# Patient Record
Sex: Female | Born: 1970 | Race: Black or African American | Hispanic: No | Marital: Married | Smoking: Current every day smoker
Health system: Southern US, Community
[De-identification: ages and names within clinical notes are randomized; demographics above are authoritative.]

## PROBLEM LIST (undated history)

## (undated) DIAGNOSIS — E669 Obesity, unspecified: Secondary | ICD-10-CM

## (undated) DIAGNOSIS — I062 Rheumatic aortic stenosis with insufficiency: Secondary | ICD-10-CM

## (undated) DIAGNOSIS — Z8614 Personal history of Methicillin resistant Staphylococcus aureus infection: Secondary | ICD-10-CM

## (undated) DIAGNOSIS — I10 Essential (primary) hypertension: Secondary | ICD-10-CM

## (undated) DIAGNOSIS — G3184 Mild cognitive impairment, so stated: Secondary | ICD-10-CM

## (undated) DIAGNOSIS — J439 Emphysema, unspecified: Secondary | ICD-10-CM

## (undated) DIAGNOSIS — Z8719 Personal history of other diseases of the digestive system: Secondary | ICD-10-CM

## (undated) DIAGNOSIS — G4730 Sleep apnea, unspecified: Secondary | ICD-10-CM

## (undated) DIAGNOSIS — A048 Other specified bacterial intestinal infections: Secondary | ICD-10-CM

## (undated) DIAGNOSIS — K589 Irritable bowel syndrome without diarrhea: Secondary | ICD-10-CM

## (undated) DIAGNOSIS — I4892 Unspecified atrial flutter: Secondary | ICD-10-CM

## (undated) DIAGNOSIS — G8929 Other chronic pain: Secondary | ICD-10-CM

## (undated) DIAGNOSIS — M1990 Unspecified osteoarthritis, unspecified site: Secondary | ICD-10-CM

## (undated) DIAGNOSIS — M47816 Spondylosis without myelopathy or radiculopathy, lumbar region: Secondary | ICD-10-CM

## (undated) DIAGNOSIS — D649 Anemia, unspecified: Secondary | ICD-10-CM

## (undated) DIAGNOSIS — M5430 Sciatica, unspecified side: Secondary | ICD-10-CM

## (undated) DIAGNOSIS — Z7901 Long term (current) use of anticoagulants: Secondary | ICD-10-CM

## (undated) DIAGNOSIS — K209 Esophagitis, unspecified without bleeding: Secondary | ICD-10-CM

## (undated) DIAGNOSIS — K7581 Nonalcoholic steatohepatitis (NASH): Secondary | ICD-10-CM

## (undated) DIAGNOSIS — I499 Cardiac arrhythmia, unspecified: Secondary | ICD-10-CM

## (undated) DIAGNOSIS — K429 Umbilical hernia without obstruction or gangrene: Secondary | ICD-10-CM

## (undated) DIAGNOSIS — R0600 Dyspnea, unspecified: Secondary | ICD-10-CM

## (undated) DIAGNOSIS — K219 Gastro-esophageal reflux disease without esophagitis: Secondary | ICD-10-CM

## (undated) DIAGNOSIS — I052 Rheumatic mitral stenosis with insufficiency: Secondary | ICD-10-CM

## (undated) DIAGNOSIS — E559 Vitamin D deficiency, unspecified: Secondary | ICD-10-CM

## (undated) DIAGNOSIS — M545 Low back pain, unspecified: Secondary | ICD-10-CM

## (undated) DIAGNOSIS — N170 Acute kidney failure with tubular necrosis: Secondary | ICD-10-CM

## (undated) DIAGNOSIS — R002 Palpitations: Secondary | ICD-10-CM

## (undated) DIAGNOSIS — R519 Headache, unspecified: Secondary | ICD-10-CM

## (undated) DIAGNOSIS — I2510 Atherosclerotic heart disease of native coronary artery without angina pectoris: Secondary | ICD-10-CM

## (undated) DIAGNOSIS — K5909 Other constipation: Secondary | ICD-10-CM

## (undated) DIAGNOSIS — F32A Depression, unspecified: Secondary | ICD-10-CM

## (undated) DIAGNOSIS — J45909 Unspecified asthma, uncomplicated: Secondary | ICD-10-CM

## (undated) DIAGNOSIS — Z952 Presence of prosthetic heart valve: Secondary | ICD-10-CM

## (undated) DIAGNOSIS — I071 Rheumatic tricuspid insufficiency: Secondary | ICD-10-CM

## (undated) DIAGNOSIS — I517 Cardiomegaly: Secondary | ICD-10-CM

## (undated) DIAGNOSIS — K7689 Other specified diseases of liver: Secondary | ICD-10-CM

## (undated) DIAGNOSIS — E7800 Pure hypercholesterolemia, unspecified: Secondary | ICD-10-CM

## (undated) DIAGNOSIS — M5030 Other cervical disc degeneration, unspecified cervical region: Secondary | ICD-10-CM

## (undated) DIAGNOSIS — E119 Type 2 diabetes mellitus without complications: Secondary | ICD-10-CM

## (undated) DIAGNOSIS — G5600 Carpal tunnel syndrome, unspecified upper limb: Secondary | ICD-10-CM

## (undated) DIAGNOSIS — E876 Hypokalemia: Secondary | ICD-10-CM

## (undated) DIAGNOSIS — F419 Anxiety disorder, unspecified: Secondary | ICD-10-CM

## (undated) DIAGNOSIS — F329 Major depressive disorder, single episode, unspecified: Secondary | ICD-10-CM

## (undated) DIAGNOSIS — I428 Other cardiomyopathies: Secondary | ICD-10-CM

## (undated) DIAGNOSIS — K7460 Unspecified cirrhosis of liver: Secondary | ICD-10-CM

## (undated) DIAGNOSIS — I700 Atherosclerosis of aorta: Secondary | ICD-10-CM

## (undated) DIAGNOSIS — E269 Hyperaldosteronism, unspecified: Secondary | ICD-10-CM

## (undated) DIAGNOSIS — G43909 Migraine, unspecified, not intractable, without status migrainosus: Secondary | ICD-10-CM

## (undated) DIAGNOSIS — E785 Hyperlipidemia, unspecified: Secondary | ICD-10-CM

## (undated) DIAGNOSIS — I509 Heart failure, unspecified: Secondary | ICD-10-CM

## (undated) DIAGNOSIS — G4733 Obstructive sleep apnea (adult) (pediatric): Secondary | ICD-10-CM

## (undated) DIAGNOSIS — R0609 Other forms of dyspnea: Secondary | ICD-10-CM

## (undated) DIAGNOSIS — I5030 Unspecified diastolic (congestive) heart failure: Secondary | ICD-10-CM

## (undated) HISTORY — PX: LAPAROSCOPIC GASTRIC BAND REMOVAL WITH LAPAROSCOPIC GASTRIC SLEEVE RESECTION: SHX6498

## (undated) HISTORY — PX: ABDOMINAL HYSTERECTOMY: SHX81

## (undated) HISTORY — PX: HERNIA REPAIR: SHX51

## (undated) HISTORY — PX: VENTRAL HERNIA REPAIR: SHX424

## (undated) HISTORY — PX: MITRAL VALVE REPLACEMENT: SHX147

## (undated) HISTORY — PX: HIATAL HERNIA REPAIR: SHX195

## (undated) HISTORY — PX: TRICUSPID VALVE REPLACEMENT: SHX816

## (undated) HISTORY — PX: CARDIAC VALVE SURGERY: SHX40

## (undated) HISTORY — PX: AORTIC VALVE REPLACEMENT: SHX41

---

## 1990-11-21 HISTORY — PX: BREAST EXCISIONAL BIOPSY: SUR124

## 1994-11-21 HISTORY — PX: LIPOSUCTION TRUNK: SUR833

## 1998-11-21 HISTORY — PX: TUBAL LIGATION: SHX77

## 2003-11-22 HISTORY — PX: UMBILICAL HERNIA REPAIR: SHX196

## 2007-03-01 ENCOUNTER — Ambulatory Visit: Payer: Self-pay | Admitting: General Practice

## 2007-03-06 ENCOUNTER — Ambulatory Visit: Payer: Self-pay | Admitting: General Practice

## 2007-03-27 ENCOUNTER — Emergency Department: Payer: Self-pay | Admitting: Emergency Medicine

## 2008-01-25 ENCOUNTER — Ambulatory Visit: Payer: Self-pay | Admitting: General Practice

## 2008-02-25 ENCOUNTER — Emergency Department: Payer: Self-pay | Admitting: Emergency Medicine

## 2008-06-01 ENCOUNTER — Emergency Department: Payer: Self-pay | Admitting: Emergency Medicine

## 2008-11-07 ENCOUNTER — Ambulatory Visit: Payer: Self-pay | Admitting: Obstetrics & Gynecology

## 2008-11-07 ENCOUNTER — Ambulatory Visit: Payer: Self-pay | Admitting: Internal Medicine

## 2008-11-11 ENCOUNTER — Ambulatory Visit: Payer: Self-pay | Admitting: Obstetrics & Gynecology

## 2008-11-21 HISTORY — PX: ABDOMINAL HYSTERECTOMY: SHX81

## 2009-03-26 ENCOUNTER — Emergency Department: Payer: Self-pay | Admitting: Emergency Medicine

## 2010-04-14 ENCOUNTER — Ambulatory Visit: Payer: Self-pay | Admitting: Obstetrics & Gynecology

## 2011-02-22 ENCOUNTER — Emergency Department (HOSPITAL_COMMUNITY)
Admission: EM | Admit: 2011-02-22 | Discharge: 2011-02-22 | Disposition: A | Discharge status: Home / Self Care | Payer: Self-pay | Attending: Emergency Medicine | Admitting: Emergency Medicine

## 2011-02-22 ENCOUNTER — Emergency Department (HOSPITAL_COMMUNITY): Payer: Self-pay

## 2011-02-22 DIAGNOSIS — R05 Cough: Secondary | ICD-10-CM | POA: Insufficient documentation

## 2011-02-22 DIAGNOSIS — S335XXA Sprain of ligaments of lumbar spine, initial encounter: Secondary | ICD-10-CM | POA: Insufficient documentation

## 2011-02-22 DIAGNOSIS — J40 Bronchitis, not specified as acute or chronic: Secondary | ICD-10-CM | POA: Insufficient documentation

## 2011-02-22 DIAGNOSIS — R059 Cough, unspecified: Secondary | ICD-10-CM | POA: Insufficient documentation

## 2011-02-22 DIAGNOSIS — E785 Hyperlipidemia, unspecified: Secondary | ICD-10-CM | POA: Insufficient documentation

## 2011-02-22 DIAGNOSIS — X58XXXA Exposure to other specified factors, initial encounter: Secondary | ICD-10-CM | POA: Insufficient documentation

## 2011-02-22 DIAGNOSIS — I10 Essential (primary) hypertension: Secondary | ICD-10-CM | POA: Insufficient documentation

## 2011-02-22 LAB — BASIC METABOLIC PANEL
CO2: 26 mEq/L (ref 19–32)
Chloride: 103 mEq/L (ref 96–112)
Creatinine, Ser: 0.87 mg/dL (ref 0.4–1.2)
GFR calc Af Amer: >60 mL/min (ref >60)
Potassium: 3.4 mEq/L — ABNORMAL LOW (ref 3.5–5.1)

## 2011-02-22 LAB — CBC
HCT: 43.6 % (ref 36.0–46.0)
Hemoglobin: 14.6 g/dL (ref 12.0–15.0)
MCH: 29.6 pg (ref 26.0–34.0)
MCHC: 33.5 g/dL (ref 30.0–36.0)

## 2011-02-22 LAB — DIFFERENTIAL
Lymphocytes Relative: 31 % (ref 12–46)
Monocytes Absolute: 0.7 10*3/uL (ref 0.1–1.0)
Monocytes Relative: 11 % (ref 3–12)
Neutro Abs: 3.3 10*3/uL (ref 1.7–7.7)

## 2011-02-22 LAB — URINALYSIS, ROUTINE W REFLEX MICROSCOPIC
Glucose, UA: NEGATIVE mg/dL
Hgb urine dipstick: NEGATIVE
Protein, ur: NEGATIVE mg/dL

## 2012-06-28 ENCOUNTER — Other Ambulatory Visit: Payer: Self-pay | Admitting: Internal Medicine

## 2012-06-28 ENCOUNTER — Ambulatory Visit (HOSPITAL_COMMUNITY)
Admission: RE | Admit: 2012-06-28 | Discharge: 2012-06-28 | Disposition: A | Discharge status: Home / Self Care | Payer: Self-pay | Source: Ambulatory Visit | Attending: Internal Medicine | Admitting: Internal Medicine

## 2012-06-28 DIAGNOSIS — R252 Cramp and spasm: Secondary | ICD-10-CM

## 2012-06-28 DIAGNOSIS — R52 Pain, unspecified: Secondary | ICD-10-CM

## 2012-06-28 DIAGNOSIS — M545 Low back pain, unspecified: Secondary | ICD-10-CM | POA: Insufficient documentation

## 2012-06-28 DIAGNOSIS — M25569 Pain in unspecified knee: Secondary | ICD-10-CM | POA: Insufficient documentation

## 2012-06-28 DIAGNOSIS — M25559 Pain in unspecified hip: Secondary | ICD-10-CM | POA: Insufficient documentation

## 2013-04-06 ENCOUNTER — Emergency Department (HOSPITAL_COMMUNITY)
Admission: EM | Admit: 2013-04-06 | Discharge: 2013-04-06 | Disposition: A | Discharge status: Home / Self Care | Payer: Medicaid Other | Source: Home / Self Care | Attending: Emergency Medicine | Admitting: Emergency Medicine

## 2013-04-06 ENCOUNTER — Encounter (HOSPITAL_COMMUNITY): Payer: Self-pay | Admitting: Emergency Medicine

## 2013-04-06 DIAGNOSIS — H01005 Unspecified blepharitis left lower eyelid: Secondary | ICD-10-CM

## 2013-04-06 DIAGNOSIS — H01009 Unspecified blepharitis unspecified eye, unspecified eyelid: Secondary | ICD-10-CM

## 2013-04-06 HISTORY — DX: Essential (primary) hypertension: I10

## 2013-04-06 HISTORY — DX: Pure hypercholesterolemia, unspecified: E78.00

## 2013-04-06 MED ORDER — TOBRAMYCIN 0.3 % OP SOLN: 1.0000 [drp] | Freq: Four times a day (QID) | OPHTHALMIC | Status: AC

## 2013-04-06 NOTE — ED Notes (Signed)
Pt c/o poss stye on lower left eye lid onset Thursday Also c/o swollen gland on left side of neck onset yest night Sx today include: blurry vision, redness of left eye and swelling Denies: f/v/n/d Tried warm compressions.   [name] alert and oriented w/no signs of acute distress.

## 2013-04-06 NOTE — ED Provider Notes (Signed)
History     CSN: [id number]  Arrival date & time 04/06/13  1456   First MD Initiated Contact with Patient 04/06/13 1607      Chief Complaint  Patient presents with  . Stye    (Consider location/radiation/quality/duration/timing/severity/associated sxs/prior treatment) Patient is a 42 y.o. female presenting with eye problem. The history is provided by the patient.  Eye Problem Location:  L eye Quality:  Aching Severity:  Moderate Onset quality:  Sudden Duration:  3 days Timing:  Constant Chronicity:  New Context: not chemical exposure, not contact lens problem, not direct trauma, not foreign body, not using machinery, not scratch and not tanning booth use   Relieved by:  Nothing Associated symptoms: crusting, discharge, inflammation, itching and redness   Associated symptoms: no blurred vision, no decreased vision, no facial rash, no headaches, no photophobia, no scotomas, no tearing, no tingling, no vomiting and no weakness   Risk factors: no conjunctival hemorrhage, not exposed to pinkeye, no previous injury to eye, no recent herpes zoster and no recent URI     Past Medical History  Diagnosis Date  . Hypertension   . High cholesterol     History reviewed. No pertinent past surgical history.  History reviewed. No pertinent family history.  History  Substance Use Topics  . Smoking status: Not on file  . Smokeless tobacco: Not on file  . Alcohol Use: Not on file    OB History   Grav Para Term Preterm Abortions TAB SAB Ect Mult Living                  Review of Systems  Constitutional: Negative for activity change.  HENT: Negative for ear pain.   Eyes: Positive for discharge, redness and itching. Negative for blurred vision, photophobia, pain and visual disturbance.  Gastrointestinal: Negative for vomiting.  Musculoskeletal: Negative for myalgias.  Neurological: Negative for tingling, weakness and headaches.    Allergies  Mucinex and Sulfa  antibiotics  Home Medications   Current Outpatient Rx  Name  Route  Sig  Dispense  Refill  . lisinopril (PRINIVIL,ZESTRIL) 40 MG tablet   Oral   Take 40 mg by mouth daily.         . hydrochlorothiazide (HYDRODIURIL) 25 MG tablet   Oral   Take 25 mg by mouth daily.         . pravastatin (PRAVACHOL) 20 MG tablet   Oral   Take 20 mg by mouth daily.         Marland Kitchen tobramycin (TOBREX) 0.3 % ophthalmic solution   Left Eye   Place 1 drop into the left eye every 6 (six) hours.   5 mL   0     BP 166/95  Pulse 86  Temp(Src) 98.1 F (36.7 C) (Oral)  Resp 18  SpO2 98%  Physical Exam  Nursing note and vitals reviewed. Constitutional: She appears well-developed and well-nourished.  HENT:  Head: Normocephalic.  Mouth/Throat: No oropharyngeal exudate.  Eyes: Conjunctivae and EOM are normal. Right eye exhibits no discharge. No foreign body present in the right eye. Left eye exhibits hordeolum. Left eye exhibits no discharge. Right conjunctiva has no hemorrhage. Left conjunctiva has no hemorrhage. No scleral icterus. Right eye exhibits normal extraocular motion.    Neck: Neck supple. No JVD present.  Neurological: [name] alert.  Skin: No rash noted. No erythema. No pallor.    ED Course  Procedures (including critical care time)  Labs Reviewed - No data to display  No results found.   1. Blepharitis of left lower eyelid       MDM  Opthalmic abx with tobrymicin- instructed to lubricate eye with visine        Rosana Hoes, MD 04/06/13 1640

## 2013-08-29 ENCOUNTER — Emergency Department: Payer: Self-pay | Admitting: Internal Medicine

## 2013-08-29 LAB — CBC
HCT: 47 % (ref 35.0–47.0)
HGB: 15.8 g/dL (ref 12.0–16.0)
MCH: 30.1 pg (ref 26.0–34.0)
MCV: 90 fL (ref 80–100)
Platelet: 245 10*3/uL (ref 150–440)
RBC: 5.24 10*6/uL — ABNORMAL HIGH (ref 3.80–5.20)
WBC: 11.7 10*3/uL — ABNORMAL HIGH (ref 3.6–11.0)

## 2013-08-29 LAB — URINALYSIS, COMPLETE
Bilirubin,UR: NEGATIVE
Blood: NEGATIVE
Glucose,UR: NEGATIVE mg/dL (ref 0–75)
Nitrite: NEGATIVE
Ph: 6 (ref 4.5–8.0)
Protein: NEGATIVE
Squamous Epithelial: 1

## 2013-08-29 LAB — COMPREHENSIVE METABOLIC PANEL
Alkaline Phosphatase: 83 U/L (ref 50–136)
BUN: 6 mg/dL — ABNORMAL LOW (ref 7–18)
Bilirubin,Total: 0.4 mg/dL (ref 0.2–1.0)
Calcium, Total: 9.3 mg/dL (ref 8.5–10.1)
Chloride: 106 mmol/L (ref 98–107)
Co2: 28 mmol/L (ref 21–32)
Creatinine: 0.75 mg/dL (ref 0.60–1.30)
EGFR (Non-African Amer.): >60
Glucose: 81 mg/dL (ref 65–99)
Potassium: 3.9 mmol/L (ref 3.5–5.1)
SGOT(AST): 20 U/L (ref 15–37)
SGPT (ALT): 26 U/L (ref 12–78)
Total Protein: 8.4 g/dL — ABNORMAL HIGH (ref 6.4–8.2)

## 2014-01-09 ENCOUNTER — Ambulatory Visit: Payer: Self-pay | Admitting: General Practice

## 2014-05-01 ENCOUNTER — Ambulatory Visit: Payer: Self-pay | Admitting: Family Medicine

## 2014-06-05 ENCOUNTER — Ambulatory Visit: Payer: Self-pay | Admitting: Urgent Care

## 2014-06-14 ENCOUNTER — Encounter (HOSPITAL_COMMUNITY): Payer: Self-pay | Admitting: Emergency Medicine

## 2014-06-14 ENCOUNTER — Emergency Department (HOSPITAL_COMMUNITY)
Admission: EM | Admit: 2014-06-14 | Discharge: 2014-06-14 | Disposition: A | Discharge status: Home / Self Care | Payer: Managed Care, Other (non HMO) | Attending: Emergency Medicine | Admitting: Emergency Medicine

## 2014-06-14 DIAGNOSIS — Z79899 Other long term (current) drug therapy: Secondary | ICD-10-CM | POA: Insufficient documentation

## 2014-06-14 DIAGNOSIS — M545 Low back pain, unspecified: Secondary | ICD-10-CM | POA: Insufficient documentation

## 2014-06-14 DIAGNOSIS — F17200 Nicotine dependence, unspecified, uncomplicated: Secondary | ICD-10-CM | POA: Insufficient documentation

## 2014-06-14 DIAGNOSIS — IMO0002 Reserved for concepts with insufficient information to code with codable children: Secondary | ICD-10-CM | POA: Insufficient documentation

## 2014-06-14 DIAGNOSIS — R1011 Right upper quadrant pain: Secondary | ICD-10-CM

## 2014-06-14 DIAGNOSIS — K7689 Other specified diseases of liver: Secondary | ICD-10-CM | POA: Insufficient documentation

## 2014-06-14 DIAGNOSIS — K760 Fatty (change of) liver, not elsewhere classified: Secondary | ICD-10-CM

## 2014-06-14 DIAGNOSIS — E785 Hyperlipidemia, unspecified: Secondary | ICD-10-CM | POA: Insufficient documentation

## 2014-06-14 DIAGNOSIS — I10 Essential (primary) hypertension: Secondary | ICD-10-CM | POA: Insufficient documentation

## 2014-06-14 LAB — URINALYSIS, ROUTINE W REFLEX MICROSCOPIC
BILIRUBIN URINE: NEGATIVE
GLUCOSE, UA: NEGATIVE mg/dL
HGB URINE DIPSTICK: NEGATIVE
Ketones, ur: NEGATIVE mg/dL
Leukocytes, UA: NEGATIVE
NITRITE: NEGATIVE
PH: 6 (ref 5.0–8.0)
Protein, ur: NEGATIVE mg/dL
SPECIFIC GRAVITY, URINE: 1.02 (ref 1.005–1.030)
Urobilinogen, UA: 1 mg/dL (ref 0.0–1.0)

## 2014-06-14 LAB — CBC WITH DIFFERENTIAL/PLATELET
BASOS ABS: 0 10*3/uL (ref 0.0–0.1)
BASOS PCT: 0 % (ref 0–1)
Eosinophils Absolute: 0.2 10*3/uL (ref 0.0–0.7)
Eosinophils Relative: 3 % (ref 0–5)
HCT: 47 % — ABNORMAL HIGH (ref 36.0–46.0)
HEMOGLOBIN: 16 g/dL — AB (ref 12.0–15.0)
LYMPHS PCT: 30 % (ref 12–46)
Lymphs Abs: 2.4 10*3/uL (ref 0.7–4.0)
MCH: 30 pg (ref 26.0–34.0)
MCHC: 34 g/dL (ref 30.0–36.0)
MCV: 88.2 fL (ref 78.0–100.0)
Monocytes Absolute: 0.7 10*3/uL (ref 0.1–1.0)
Monocytes Relative: 10 % (ref 3–12)
NEUTROS ABS: 4.5 10*3/uL (ref 1.7–7.7)
NEUTROS PCT: 57 % (ref 43–77)
Platelets: 257 10*3/uL (ref 150–400)
RBC: 5.33 MIL/uL — ABNORMAL HIGH (ref 3.87–5.11)
RDW: 15 % (ref 11.5–15.5)
WBC: 7.8 10*3/uL (ref 4.0–10.5)

## 2014-06-14 LAB — COMPREHENSIVE METABOLIC PANEL
ALBUMIN: 4 g/dL (ref 3.5–5.2)
ALK PHOS: 66 U/L (ref 39–117)
ALT: 17 U/L (ref 0–35)
AST: 21 U/L (ref 0–37)
Anion gap: 16 — ABNORMAL HIGH (ref 5–15)
BILIRUBIN TOTAL: 0.2 mg/dL — AB (ref 0.3–1.2)
BUN: 9 mg/dL (ref 6–23)
CHLORIDE: 102 meq/L (ref 96–112)
CO2: 24 mEq/L (ref 19–32)
Calcium: 9.7 mg/dL (ref 8.4–10.5)
Creatinine, Ser: 0.92 mg/dL (ref 0.50–1.10)
GFR calc Af Amer: 87 mL/min — ABNORMAL LOW (ref >90)
GFR calc non Af Amer: 75 mL/min — ABNORMAL LOW (ref >90)
Glucose, Bld: 68 mg/dL — ABNORMAL LOW (ref 70–99)
POTASSIUM: 3.3 meq/L — AB (ref 3.7–5.3)
SODIUM: 142 meq/L (ref 137–147)
Total Protein: 8.5 g/dL — ABNORMAL HIGH (ref 6.0–8.3)

## 2014-06-14 LAB — LIPASE, BLOOD: LIPASE: 33 U/L (ref 11–59)

## 2014-06-14 NOTE — ED Notes (Signed)
Pt reports recently being diagnosed with a "fatty liver." pt reports onset yesterday of bilateral lower back pain, now is more right flank pain that radiates around to right abd, reports feeling like abd is distended. Denies n/v/d.

## 2014-06-14 NOTE — ED Notes (Signed)
Pt is difficult stick, will call phlebotomy

## 2014-06-14 NOTE — ED Provider Notes (Signed)
CSN: [id number]     Arrival date & time 06/14/14  1333 History   First MD Initiated Contact with Patient 06/14/14 1720     Chief Complaint  Patient presents with  . Flank Pain  . Abdominal Pain      HPI Pt reports recently being diagnosed with a "fatty liver." pt reports onset yesterday of bilateral lower back pain, now is more right flank pain that radiates around to right abd, reports feeling like abd is distended. Denies n/v/d.   Past Medical History  Diagnosis Date  . Hypertension   . High cholesterol    History reviewed. No pertinent past surgical history. History reviewed. No pertinent family history. History  Substance Use Topics  . Smoking status: Current Every Day Smoker  . Smokeless tobacco: Not on file  . Alcohol Use: No   OB History   Grav Para Term Preterm Abortions TAB SAB Ect Mult Living                 Review of Systems  All other systems reviewed and are negative  Allergies  Ivp dye; Hctz; Sulfa antibiotics; Lisinopril; and Mucinex  Home Medications   Prior to Admission medications   Medication Sig Start Date End Date Taking? Authorizing Provider  Biotin (BIOTIN 5000) 5 MG CAPS Take 15,000 mg by mouth daily.   Yes Historical Provider, MD  Cholecalciferol 1000 UNITS capsule Take 1,000 Units by mouth daily.   Yes Historical Provider, MD  cyclobenzaprine (FLEXERIL) 10 MG tablet Take 10 mg by mouth 2 (two) times daily. With meals   Yes Historical Provider, MD  diazepam (VALIUM) 5 MG tablet Take 5 mg by mouth 2 (two) times daily as needed for anxiety.   Yes Historical Provider, MD  Garlic 6761 MG CAPS Take 1,000 mg by mouth daily.   Yes Historical Provider, MD  HYDROcodone-acetaminophen (NORCO/VICODIN) 5-325 MG per tablet Take 1 tablet by mouth 2 (two) times daily as needed for moderate pain.   Yes Historical Provider, MD  meclizine (ANTIVERT) 25 MG tablet Take 25 mg by mouth every 6 (six) hours as needed for dizziness.   Yes Historical Provider, MD   meloxicam (MOBIC) 15 MG tablet Take 15 mg by mouth daily as needed for pain.   Yes Historical Provider, MD  Omega-3 Fatty Acids (FISH OIL) 1000 MG CAPS Take 1,000 mg by mouth daily.   Yes Historical Provider, MD  potassium chloride (MICRO-K) 10 MEQ CR capsule Take 10 mEq by mouth daily.   Yes Historical Provider, MD  pravastatin (PRAVACHOL) 20 MG tablet Take 20 mg by mouth daily.   Yes Historical Provider, MD  verapamil (CALAN-SR) 240 MG CR tablet Take 240 mg by mouth daily.   Yes Historical Provider, MD   BP 160/86  Pulse 82  Temp(Src) 98.5 F (36.9 C) (Oral)  Resp 23  SpO2 98% Physical Exam Physical Exam  Nursing note and vitals reviewed. Constitutional: [name] oriented to person, place, and time. She appears well-developed and well-nourished. No distress.  HENT:  Head: Normocephalic and atraumatic.  Eyes: Pupils are equal, round, and reactive to light.  Neck: Normal range of motion.  Cardiovascular: Normal rate and intact distal pulses.   Pulmonary/Chest: No respiratory distress.  Abdominal: Normal appearance. She exhibits no distension.  mild tenderness right upper quadrant with no focal findings.  No rebound guarding tenderness.  Active bowel sounds.  No masses. Musculoskeletal: Normal range of motion.  Neurological: [name] alert and oriented to person, place, and  time. No cranial nerve deficit.  Skin: Skin is warm and dry. No rash noted.  Psychiatric: She has a normal mood and affect. Her behavior is normal.   ED Course  Procedures (including critical care time) Labs Review Labs Reviewed  CBC WITH DIFFERENTIAL - Abnormal; Notable for the following:    RBC 5.33 (*)    Hemoglobin 16.0 (*)    HCT 47.0 (*)    All other components within normal limits  COMPREHENSIVE METABOLIC PANEL - Abnormal; Notable for the following:    Potassium 3.3 (*)    Glucose, Bld 68 (*)    Total Protein 8.5 (*)    Total Bilirubin 0.2 (*)    GFR calc non Af Amer 75 (*)    GFR calc Af Amer 87 (*)     Anion gap 16 (*)    All other components within normal limits  LIPASE, BLOOD  URINALYSIS, ROUTINE W REFLEX MICROSCOPIC    Imaging Review No results found.    MDM   Final diagnoses:  Fatty liver  Right upper quadrant pain        Dot Lanes, MD 06/14/14 [phone]

## 2014-06-14 NOTE — Discharge Instructions (Signed)

## 2014-07-16 ENCOUNTER — Ambulatory Visit: Payer: Self-pay | Admitting: Nurse Practitioner

## 2014-07-22 ENCOUNTER — Ambulatory Visit: Payer: Self-pay | Admitting: Nurse Practitioner

## 2014-08-21 ENCOUNTER — Ambulatory Visit: Payer: Self-pay | Admitting: Nurse Practitioner

## 2014-09-11 DIAGNOSIS — E782 Mixed hyperlipidemia: Secondary | ICD-10-CM | POA: Insufficient documentation

## 2014-09-26 ENCOUNTER — Ambulatory Visit: Payer: Self-pay | Admitting: Nurse Practitioner

## 2014-10-13 ENCOUNTER — Ambulatory Visit: Payer: Self-pay | Admitting: Gastroenterology

## 2014-10-21 ENCOUNTER — Ambulatory Visit: Payer: Self-pay | Admitting: Nurse Practitioner

## 2014-10-28 DIAGNOSIS — G4733 Obstructive sleep apnea (adult) (pediatric): Secondary | ICD-10-CM | POA: Insufficient documentation

## 2014-10-28 DIAGNOSIS — I517 Cardiomegaly: Secondary | ICD-10-CM | POA: Insufficient documentation

## 2014-11-21 ENCOUNTER — Ambulatory Visit: Payer: Self-pay | Admitting: Nurse Practitioner

## 2014-12-05 ENCOUNTER — Ambulatory Visit: Payer: Self-pay | Admitting: Internal Medicine

## 2014-12-05 HISTORY — PX: LEFT HEART CATH AND CORONARY ANGIOGRAPHY: CATH118249

## 2015-02-23 DIAGNOSIS — Z22322 Carrier or suspected carrier of Methicillin resistant Staphylococcus aureus: Secondary | ICD-10-CM

## 2015-02-23 HISTORY — DX: Carrier or suspected carrier of methicillin resistant Staphylococcus aureus: Z22.322

## 2015-03-04 ENCOUNTER — Emergency Department (HOSPITAL_COMMUNITY): Payer: Managed Care, Other (non HMO)

## 2015-03-04 ENCOUNTER — Inpatient Hospital Stay (HOSPITAL_COMMUNITY)
Admission: EM | Admit: 2015-03-04 | Discharge: 2015-03-06 | DRG: 177 | Disposition: A | Discharge status: Home / Self Care | Payer: Managed Care, Other (non HMO) | Attending: Pulmonary Disease | Admitting: Pulmonary Disease

## 2015-03-04 ENCOUNTER — Encounter (HOSPITAL_COMMUNITY): Payer: Self-pay | Admitting: Emergency Medicine

## 2015-03-04 DIAGNOSIS — E785 Hyperlipidemia, unspecified: Secondary | ICD-10-CM | POA: Diagnosis present

## 2015-03-04 DIAGNOSIS — E876 Hypokalemia: Secondary | ICD-10-CM | POA: Diagnosis present

## 2015-03-04 DIAGNOSIS — M5430 Sciatica, unspecified side: Secondary | ICD-10-CM | POA: Diagnosis present

## 2015-03-04 DIAGNOSIS — K760 Fatty (change of) liver, not elsewhere classified: Secondary | ICD-10-CM | POA: Diagnosis present

## 2015-03-04 DIAGNOSIS — K589 Irritable bowel syndrome without diarrhea: Secondary | ICD-10-CM | POA: Diagnosis present

## 2015-03-04 DIAGNOSIS — I2510 Atherosclerotic heart disease of native coronary artery without angina pectoris: Secondary | ICD-10-CM | POA: Diagnosis present

## 2015-03-04 DIAGNOSIS — E780 Pure hypercholesterolemia: Secondary | ICD-10-CM | POA: Diagnosis present

## 2015-03-04 DIAGNOSIS — R739 Hyperglycemia, unspecified: Secondary | ICD-10-CM | POA: Diagnosis present

## 2015-03-04 DIAGNOSIS — J9601 Acute respiratory failure with hypoxia: Secondary | ICD-10-CM | POA: Diagnosis present

## 2015-03-04 DIAGNOSIS — G4733 Obstructive sleep apnea (adult) (pediatric): Secondary | ICD-10-CM | POA: Diagnosis present

## 2015-03-04 DIAGNOSIS — J690 Pneumonitis due to inhalation of food and vomit: Secondary | ICD-10-CM | POA: Diagnosis not present

## 2015-03-04 DIAGNOSIS — M1990 Unspecified osteoarthritis, unspecified site: Secondary | ICD-10-CM | POA: Diagnosis present

## 2015-03-04 DIAGNOSIS — K219 Gastro-esophageal reflux disease without esophagitis: Secondary | ICD-10-CM | POA: Diagnosis present

## 2015-03-04 DIAGNOSIS — I10 Essential (primary) hypertension: Secondary | ICD-10-CM | POA: Diagnosis present

## 2015-03-04 DIAGNOSIS — J80 Acute respiratory distress syndrome: Secondary | ICD-10-CM | POA: Diagnosis not present

## 2015-03-04 DIAGNOSIS — Z22322 Carrier or suspected carrier of Methicillin resistant Staphylococcus aureus: Secondary | ICD-10-CM

## 2015-03-04 DIAGNOSIS — E8770 Fluid overload, unspecified: Secondary | ICD-10-CM

## 2015-03-04 DIAGNOSIS — I2584 Coronary atherosclerosis due to calcified coronary lesion: Secondary | ICD-10-CM

## 2015-03-04 DIAGNOSIS — F419 Anxiety disorder, unspecified: Secondary | ICD-10-CM | POA: Diagnosis present

## 2015-03-04 DIAGNOSIS — I5033 Acute on chronic diastolic (congestive) heart failure: Secondary | ICD-10-CM | POA: Diagnosis present

## 2015-03-04 DIAGNOSIS — I080 Rheumatic disorders of both mitral and aortic valves: Secondary | ICD-10-CM | POA: Diagnosis present

## 2015-03-04 HISTORY — DX: Anxiety disorder, unspecified: F41.9

## 2015-03-04 HISTORY — DX: Irritable bowel syndrome, unspecified: K58.9

## 2015-03-04 HISTORY — DX: Unspecified osteoarthritis, unspecified site: M19.90

## 2015-03-04 HISTORY — DX: Sciatica, unspecified side: M54.30

## 2015-03-04 HISTORY — DX: Hypokalemia: E87.6

## 2015-03-04 HISTORY — DX: Gastro-esophageal reflux disease without esophagitis: K21.9

## 2015-03-04 LAB — COMPREHENSIVE METABOLIC PANEL
ALK PHOS: 65 U/L (ref 39–117)
ALT: 16 U/L (ref 0–35)
ANION GAP: 12 (ref 5–15)
AST: 23 U/L (ref 0–37)
Albumin: 3.7 g/dL (ref 3.5–5.2)
BILIRUBIN TOTAL: 0.6 mg/dL (ref 0.3–1.2)
BUN: 6 mg/dL (ref 6–23)
CHLORIDE: 106 mmol/L (ref 96–112)
CO2: 21 mmol/L (ref 19–32)
Calcium: 9.2 mg/dL (ref 8.4–10.5)
Creatinine, Ser: 0.74 mg/dL (ref 0.50–1.10)
GLUCOSE: 155 mg/dL — AB (ref 70–99)
POTASSIUM: 3.1 mmol/L — AB (ref 3.5–5.1)
Sodium: 139 mmol/L (ref 135–145)
Total Protein: 7.4 g/dL (ref 6.0–8.3)

## 2015-03-04 LAB — I-STAT ARTERIAL BLOOD GAS, ED
ACID-BASE DEFICIT: 3 mmol/L — AB (ref 0.0–2.0)
Bicarbonate: 21.3 mEq/L (ref 20.0–24.0)
O2 SAT: 88 %
TCO2: 22 mmol/L (ref 0–100)
pCO2 arterial: 34.4 mmHg — ABNORMAL LOW (ref 35.0–45.0)
pH, Arterial: 7.402 (ref 7.350–7.450)
pO2, Arterial: 55 mmHg — ABNORMAL LOW (ref 80.0–100.0)

## 2015-03-04 LAB — CBC
HCT: 42.4 % (ref 36.0–46.0)
Hemoglobin: 13.9 g/dL (ref 12.0–15.0)
MCH: 28.1 pg (ref 26.0–34.0)
MCHC: 32.8 g/dL (ref 30.0–36.0)
MCV: 85.8 fL (ref 78.0–100.0)
Platelets: 249 10*3/uL (ref 150–400)
RBC: 4.94 MIL/uL (ref 3.87–5.11)
RDW: 14.5 % (ref 11.5–15.5)
WBC: 12.6 10*3/uL — ABNORMAL HIGH (ref 4.0–10.5)

## 2015-03-04 LAB — I-STAT TROPONIN, ED: TROPONIN I, POC: 0.03 ng/mL (ref 0.00–0.08)

## 2015-03-04 NOTE — ED Notes (Addendum)
Pt scheduled to have valve replacement at [facility] on 4/19.  States she believes she aspirated water while having dental work today at [place] she began coughing during procedure, has tightness across her chest, and gurgling when she is lying down.  Also reports coughing up bright red blood and L sided abd pain. O2 sats 86-89% at triage.

## 2015-03-04 NOTE — ED Provider Notes (Signed)
CSN: [id number]     Arrival date & time 03/04/15  2212 History   First MD Initiated Contact with Patient 03/04/15 2301     This chart was scribed for Linton Flemings, MD by Forrestine Him, ED Scribe. This patient was seen in room D35C/D35C and the patient's care was started 11:12 PM.   Chief Complaint  Patient presents with  . Shortness of Breath  . Chest Pain  . Hemoptysis   The history is provided by the patient. No language interpreter was used.    HPI Comments: Sharol Croghan is a 44 y.o. female with a PMHx of HTN, hyperlipidemia, anxiety, arthritis, and hyperkalemia who presents to the Emergency Department complaining of constant, ongoing, worsening shortness of breath and chest tightness x 1 day. Pt underwent dental work this afternoon at approximately 4 PM and states she feels she may have aspirated some water during the procedure. States she began coughing and felt tightness across her chest while lying down in the middle of the procedure. Ms. Kral has also noted bright red blood when coughing along with L sided abdominal pain. Pt is due for Valve replacement at [facility] in 5 days. Currently pt is not on any blood thinners. Pt has also stopped all medications with the exception of Lasix in preparation for her upcoming surgery.  [name] followed by Dionisio David NP-PCP [name] followed by Dr. Serafina Royals- Cardiologist at [facility]  Past Medical History  Diagnosis Date  . Hypertension   . High cholesterol   . Anxiety   . IBS (irritable bowel syndrome)   . Acid reflux   . Sciatic pain   . Arthritis   . High cholesterol   . Hypokalemia    Past Surgical History  Procedure Laterality Date  . Abdominal hysterectomy    . Cesarean section    . Hernia repair     No family history on file. History  Substance Use Topics  . Smoking status: Current Every Day Smoker  . Smokeless tobacco: Not on file  . Alcohol Use: No   OB History    No data available     Review of Systems   Constitutional: Negative for fever and chills.  Respiratory: Positive for cough, chest tightness and shortness of breath.   Gastrointestinal: Positive for abdominal pain.  Skin: Negative for rash.  Neurological: Negative for dizziness.  Psychiatric/Behavioral: Negative for confusion.      Allergies  Ivp dye; Hctz; Sulfa antibiotics; Lisinopril; and Mucinex  Home Medications   Prior to Admission medications   Medication Sig Start Date End Date Taking? Authorizing Provider  Biotin (BIOTIN 5000) 5 MG CAPS Take 15,000 mg by mouth daily.    Historical Provider, MD  Cholecalciferol 1000 UNITS capsule Take 1,000 Units by mouth daily.    Historical Provider, MD  cyclobenzaprine (FLEXERIL) 10 MG tablet Take 10 mg by mouth 2 (two) times daily. With meals    Historical Provider, MD  diazepam (VALIUM) 5 MG tablet Take 5 mg by mouth 2 (two) times daily as needed for anxiety.    Historical Provider, MD  Garlic 2725 MG CAPS Take 1,000 mg by mouth daily.    Historical Provider, MD  HYDROcodone-acetaminophen (NORCO/VICODIN) 5-325 MG per tablet Take 1 tablet by mouth 2 (two) times daily as needed for moderate pain.    Historical Provider, MD  meclizine (ANTIVERT) 25 MG tablet Take 25 mg by mouth every 6 (six) hours as needed for dizziness.    Historical Provider, MD  meloxicam (MOBIC) 15 MG tablet Take 15 mg by mouth daily as needed for pain.    Historical Provider, MD  Omega-3 Fatty Acids (FISH OIL) 1000 MG CAPS Take 1,000 mg by mouth daily.    Historical Provider, MD  potassium chloride (MICRO-K) 10 MEQ CR capsule Take 10 mEq by mouth daily.    Historical Provider, MD  verapamil (CALAN-SR) 240 MG CR tablet Take 240 mg by mouth daily.    Historical Provider, MD   Triage Vitals: BP 168/89 mmHg  Pulse 110  Temp(Src) 99.2 F (37.3 C)  Resp 40  Ht 5' 2.5" (1.588 m)  Wt 220 lb (99.791 kg)  BMI 39.57 kg/m2  SpO2 88%   Physical Exam  ED Course  Procedures (including critical care  time)  DIAGNOSTIC STUDIES: Oxygen Saturation is 88% on RA, low by my interpretation.    COORDINATION OF CARE: 11:12 PM-Discussed treatment plan with pt at bedside and pt agreed to plan.     Labs Review Labs Reviewed  CBC - Abnormal; Notable for the following:    WBC 12.6 (*)    All other components within normal limits  COMPREHENSIVE METABOLIC PANEL - Abnormal; Notable for the following:    Potassium 3.1 (*)    Glucose, Bld 155 (*)    All other components within normal limits  I-STAT ARTERIAL BLOOD GAS, ED - Abnormal; Notable for the following:    pCO2 arterial 34.4 (*)    pO2, Arterial 55.0 (*)    Acid-base deficit 3.0 (*)    All other components within normal limits  BRAIN NATRIURETIC PEPTIDE  I-STAT TROPOININ, ED    Imaging Review Dg Chest Portable 1 View  03/04/2015   CLINICAL DATA:  Chest pain and shortness of breath. Spitting up blood. Possible aspiration at the dentist.  EXAM: PORTABLE CHEST - 1 VIEW  COMPARISON:  01/09/2014  FINDINGS: Diffuse patchy lung opacity, airspace type. No cephalized blood flow or pleural effusion.  There is chronic mild cardiomegaly. Negative aortic and hilar contours.  IMPRESSION: Diffuse airspace disease favoring multi focal pneumonia. Given the history, alveolar hemorrhage or widespread aspiration are the main differential considerations.   Electronically Signed   By: Monte Fantasia M.D.   On: 03/04/2015 23:56     EKG Interpretation   Date/Time:  Wednesday March 04 2015 22:43:10 EDT Ventricular Rate:  105 PR Interval:  158 QRS Duration: 84 QT Interval:  350 QTC Calculation: 462 R Axis:   63 Text Interpretation:  Sinus tachycardia Biatrial enlargement Left  ventricular hypertrophy Nonspecific T wave abnormality Abnormal ECG No old  tracing to compare Confirmed by Alexyia Guarino  MD, Soleil Mas ([id number]) on 03/05/2015  1:34:07 AM      CRITICAL CARE Performed by: Kalman Drape Total critical care time: 60 min Critical care time was exclusive of  separately billable procedures and treating other patients. Critical care was necessary to treat or prevent imminent or life-threatening deterioration. Critical care was time spent personally by me on the following activities: development of treatment plan with patient and/or surrogate as well as nursing, discussions with consultants, evaluation of patient's response to treatment, examination of patient, obtaining history from patient or surrogate, ordering and performing treatments and interventions, ordering and review of laboratory studies, ordering and review of radiographic studies, pulse oximetry and re-evaluation of patient's condition.  MDM   Final diagnoses:  Acute respiratory distress syndrome (ARDS)    44 year old female presents to the emergency department with shortness of breath and hemoptysis after dental procedure today.  Patient reports she had dental filling replaced and cleaning in preparation of heart surgery at [facility].  Patient reports she is due to have aortic, mitral and tricuspid valve replacement in 5 days.  Patient arrives hypoxic.  Chest x-ray concerning for ARDS.  Will discuss with pulmonary/critical care for their evaluation.  [name] placed on BiPAP with good improvement.  Respiratory rate has improved.  2:02 AM Patient continued to be stable on BiPAP.  ABG noted.  Chest x-ray with diffuse airspace disease, aspiration versus alveolar hemorrhage.  Case was discussed with critical care, who will see and admit the patient.  Patient updated on findings and plan.  I personally performed the services described in this documentation, which was scribed in my presence. The recorded information has been reviewed and is accurate.    Linton Flemings, MD 03/05/15 [phone]

## 2015-03-05 ENCOUNTER — Inpatient Hospital Stay (HOSPITAL_COMMUNITY): Payer: Managed Care, Other (non HMO)

## 2015-03-05 DIAGNOSIS — E876 Hypokalemia: Secondary | ICD-10-CM | POA: Diagnosis present

## 2015-03-05 DIAGNOSIS — R739 Hyperglycemia, unspecified: Secondary | ICD-10-CM | POA: Diagnosis present

## 2015-03-05 DIAGNOSIS — M1990 Unspecified osteoarthritis, unspecified site: Secondary | ICD-10-CM | POA: Diagnosis present

## 2015-03-05 DIAGNOSIS — J80 Acute respiratory distress syndrome: Secondary | ICD-10-CM | POA: Diagnosis present

## 2015-03-05 DIAGNOSIS — I340 Nonrheumatic mitral (valve) insufficiency: Secondary | ICD-10-CM | POA: Diagnosis not present

## 2015-03-05 DIAGNOSIS — J9601 Acute respiratory failure with hypoxia: Secondary | ICD-10-CM

## 2015-03-05 DIAGNOSIS — I10 Essential (primary) hypertension: Secondary | ICD-10-CM | POA: Diagnosis present

## 2015-03-05 DIAGNOSIS — I5031 Acute diastolic (congestive) heart failure: Secondary | ICD-10-CM | POA: Diagnosis not present

## 2015-03-05 DIAGNOSIS — E780 Pure hypercholesterolemia: Secondary | ICD-10-CM | POA: Diagnosis present

## 2015-03-05 DIAGNOSIS — I2510 Atherosclerotic heart disease of native coronary artery without angina pectoris: Secondary | ICD-10-CM | POA: Diagnosis present

## 2015-03-05 DIAGNOSIS — G4733 Obstructive sleep apnea (adult) (pediatric): Secondary | ICD-10-CM | POA: Diagnosis present

## 2015-03-05 DIAGNOSIS — Z22322 Carrier or suspected carrier of Methicillin resistant Staphylococcus aureus: Secondary | ICD-10-CM | POA: Diagnosis not present

## 2015-03-05 DIAGNOSIS — K589 Irritable bowel syndrome without diarrhea: Secondary | ICD-10-CM | POA: Diagnosis present

## 2015-03-05 DIAGNOSIS — J690 Pneumonitis due to inhalation of food and vomit: Principal | ICD-10-CM

## 2015-03-05 DIAGNOSIS — K760 Fatty (change of) liver, not elsewhere classified: Secondary | ICD-10-CM | POA: Diagnosis present

## 2015-03-05 DIAGNOSIS — F419 Anxiety disorder, unspecified: Secondary | ICD-10-CM | POA: Diagnosis present

## 2015-03-05 DIAGNOSIS — E785 Hyperlipidemia, unspecified: Secondary | ICD-10-CM | POA: Diagnosis present

## 2015-03-05 DIAGNOSIS — I080 Rheumatic disorders of both mitral and aortic valves: Secondary | ICD-10-CM | POA: Diagnosis present

## 2015-03-05 DIAGNOSIS — M5430 Sciatica, unspecified side: Secondary | ICD-10-CM | POA: Diagnosis present

## 2015-03-05 DIAGNOSIS — K219 Gastro-esophageal reflux disease without esophagitis: Secondary | ICD-10-CM | POA: Diagnosis present

## 2015-03-05 DIAGNOSIS — I5033 Acute on chronic diastolic (congestive) heart failure: Secondary | ICD-10-CM | POA: Diagnosis present

## 2015-03-05 LAB — BASIC METABOLIC PANEL
Anion gap: 7 (ref 5–15)
BUN: 7 mg/dL (ref 6–23)
CO2: 25 mmol/L (ref 19–32)
CREATININE: 0.66 mg/dL (ref 0.50–1.10)
Calcium: 8.9 mg/dL (ref 8.4–10.5)
Chloride: 108 mmol/L (ref 96–112)
GFR calc Af Amer: >90 mL/min (ref >90)
GFR calc non Af Amer: >90 mL/min (ref >90)
GLUCOSE: 114 mg/dL — AB (ref 70–99)
Potassium: 3.4 mmol/L — ABNORMAL LOW (ref 3.5–5.1)
SODIUM: 140 mmol/L (ref 135–145)

## 2015-03-05 LAB — CBC
HCT: 40.6 % (ref 36.0–46.0)
Hemoglobin: 13.4 g/dL (ref 12.0–15.0)
MCH: 28.5 pg (ref 26.0–34.0)
MCHC: 33 g/dL (ref 30.0–36.0)
MCV: 86.4 fL (ref 78.0–100.0)
Platelets: 248 10*3/uL (ref 150–400)
RBC: 4.7 MIL/uL (ref 3.87–5.11)
RDW: 14.6 % (ref 11.5–15.5)
WBC: 15.8 10*3/uL — ABNORMAL HIGH (ref 4.0–10.5)

## 2015-03-05 LAB — PHOSPHORUS: Phosphorus: 3.6 mg/dL (ref 2.3–4.6)

## 2015-03-05 LAB — BRAIN NATRIURETIC PEPTIDE: B Natriuretic Peptide: 215 pg/mL — ABNORMAL HIGH (ref 0.0–100.0)

## 2015-03-05 LAB — MAGNESIUM: Magnesium: 2.2 mg/dL (ref 1.5–2.5)

## 2015-03-05 LAB — MRSA PCR SCREENING: MRSA by PCR: POSITIVE — AB

## 2015-03-05 MED ORDER — POTASSIUM CHLORIDE CRYS ER 20 MEQ PO TBCR
40.0000 meq | EXTENDED_RELEASE_TABLET | Freq: Once | ORAL | Status: AC
  Administered 2015-03-05: 40 meq via ORAL
  Filled 2015-03-05: qty 2

## 2015-03-05 MED ORDER — SODIUM CHLORIDE 0.9 % IJ SOLN
3.0000 mL | Freq: Two times a day (BID) | INTRAMUSCULAR | Status: DC
  Administered 2015-03-05 – 2015-03-06 (×3): 3 mL via INTRAVENOUS

## 2015-03-05 MED ORDER — HYDRALAZINE HCL 20 MG/ML IJ SOLN
10.0000 mg | INTRAMUSCULAR | Status: DC | PRN
  Administered 2015-03-05: 10 mg via INTRAVENOUS
  Filled 2015-03-05: qty 1

## 2015-03-05 MED ORDER — FUROSEMIDE 10 MG/ML IJ SOLN
40.0000 mg | Freq: Once | INTRAMUSCULAR | Status: AC
  Administered 2015-03-05: 40 mg via INTRAVENOUS
  Filled 2015-03-05: qty 4

## 2015-03-05 MED ORDER — SODIUM CHLORIDE 0.9 % IV SOLN: 250.0000 mL | INTRAVENOUS | Status: DC | PRN

## 2015-03-05 MED ORDER — SIMETHICONE 40 MG/0.6ML PO SUSP
40.0000 mg | Freq: Four times a day (QID) | ORAL | Status: DC | PRN
  Administered 2015-03-05: 40 mg via ORAL
  Filled 2015-03-05 (×2): qty 0.6

## 2015-03-05 MED ORDER — MUPIROCIN 2 % EX OINT
TOPICAL_OINTMENT | Freq: Two times a day (BID) | CUTANEOUS | Status: DC
  Administered 2015-03-05: 1 via NASAL
  Administered 2015-03-05 – 2015-03-06 (×2): via NASAL
  Filled 2015-03-05: qty 22

## 2015-03-05 MED ORDER — HYDROCODONE-ACETAMINOPHEN 5-325 MG PO TABS
1.0000 | ORAL_TABLET | Freq: Once | ORAL | Status: AC
  Administered 2015-03-05: 1 via ORAL
  Filled 2015-03-05: qty 1

## 2015-03-05 MED ORDER — SODIUM CHLORIDE 0.9 % IJ SOLN: 3.0000 mL | INTRAMUSCULAR | Status: DC | PRN

## 2015-03-05 MED ORDER — FAMOTIDINE IN NACL 20-0.9 MG/50ML-% IV SOLN
20.0000 mg | Freq: Two times a day (BID) | INTRAVENOUS | Status: DC
  Administered 2015-03-05 – 2015-03-06 (×3): 20 mg via INTRAVENOUS
  Filled 2015-03-05 (×5): qty 50

## 2015-03-05 NOTE — ED Notes (Signed)
Patient was taken off bipap by Heber Durango, NP.  On room air patient desats to 87%.  Patient was then placed on [place] at 2 liters, spo2 is now 94-96%.  No acute distress noted, resting peacefully, vital signs stable.

## 2015-03-05 NOTE — H&P (Signed)
PULMONARY / CRITICAL CARE MEDICINE   Name: Shelly Huynh MRN: [id number] DOB: 31-Oct-1971    ADMISSION DATE:  03/04/2015 CONSULTATION DATE:  03/05/2015  REFERRING MD :  Dr. Sharol Given EDP  CHIEF COMPLAINT:  SOB  INITIAL PRESENTATION: 44 year old female presented to ED 4/13 with complaints of SOB, chest tightness, and hemoptysis after dental procedure. CXR shows diffues infiltrates. She was started on BiPAP with improvement. PCCM to admit.   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 She takes lasix PRN for LE edema, and has been using it more than normal recently over the past week. [name] typically unable to lay flat due to SOB and sleeps with at least 2-3 pillows. She went to the dentist 4/14 to have a filling placed and was in her USOH. While at the dentist she was laying down flat and had a sip of water which she feels as though "went down the wrong pipe". Shortly after, she developed cough and SOB while laying flat. This continued for several hours. She developed hemoptysis as well which she described as only occuring with the deepest coughs, and was usually mixed with mucous. I asked if this had a pink frothy appearance, and she thought that maybe it did. In ED she was hypoxic with PaO2 55 on [place]. CXR showed diffuse infiltrate. She was placed on BiPAP with great improvement. PCCM to admit.   PAST MEDICAL HISTORY :   has a past medical history of Hypertension; High cholesterol; Anxiety; IBS (irritable bowel syndrome); Acid reflux; Sciatic pain; Arthritis; High cholesterol; and Hypokalemia.  has past surgical history that includes Abdominal  hysterectomy; Cesarean section; and Hernia repair. Prior to Admission medications   Medication Sig Start Date End Date Taking? Authorizing Provider  amoxicillin (AMOXIL) 500 MG capsule Take 2,000 mg by mouth as needed (for dental procedure).  01/20/15  Yes Historical Provider, MD  Biotin w/ Vitamins C & E 1250-7.5-7.5 MCG-MG-UNT CHEW Chew 2 tablets by mouth daily.   Yes Historical Provider, MD  cyclobenzaprine (FLEXERIL) 5 MG tablet Take 5 mg by mouth at bedtime.  01/13/15  Yes Historical Provider, MD  diazepam (VALIUM) 5 MG tablet Take 5 mg by mouth 2 (two) times daily as needed for anxiety.   Yes Historical Provider, MD  furosemide (LASIX) 20 MG tablet Take 20 mg by mouth daily.  05/13/14 05/13/15 Yes Historical Provider, MD  Garlic 1829 MG CAPS Take 1,000 mg by mouth daily.   Yes Historical Provider, MD  hydrALAZINE (APRESOLINE) 25 MG tablet Take 25 mg by mouth 2 (two) times daily.  01/20/15 01/20/16 Yes Historical Provider, MD  HYDROcodone-acetaminophen (NORCO/VICODIN) 5-325 MG per tablet Take 1 tablet by mouth 2 (two) times daily as needed for moderate pain.   Yes Historical Provider, MD  Linaclotide (LINZESS) 290 MCG CAPS capsule Take 290 mcg by mouth daily.    Yes Historical Provider, MD  meclizine (ANTIVERT) 25 MG tablet Take 25 mg by mouth every 6 (six) hours as needed for dizziness.   Yes Historical Provider, MD  meloxicam (MOBIC) 15 MG tablet Take 15 mg by mouth daily as needed for pain.   Yes Historical Provider, MD  Omega-3  Fatty Acids (FISH OIL) 1000 MG CAPS Take 1,000 mg by mouth daily.   Yes Historical Provider, MD  omeprazole (PRILOSEC) 20 MG capsule Take 20 mg by mouth daily.  10/03/14  Yes Historical Provider, MD  potassium chloride (K-DUR) 10 MEQ tablet Take 10 mEq by mouth daily.  12/07/14  Yes Historical Provider, MD  pravastatin (PRAVACHOL) 20 MG tablet Take 20 mg by mouth daily.  12/07/14  Yes Historical Provider, MD  verapamil (CALAN-SR) 120 MG CR tablet Take 120 mg by mouth daily.   01/13/15  Yes Historical Provider, MD   Allergies  Allergen Reactions  . Ivp Dye [Iodinated Diagnostic Agents] Anaphylaxis and Hives    Especially CT contrast media; tightness in chest  . Hctz [Hydrochlorothiazide] Hives  . Sulfa Antibiotics Hives  . Lisinopril Hives and Rash  . Mucinex [Guaifenesin Er] Hives and Rash    FAMILY HISTORY:  has no family status information on file.  SOCIAL HISTORY:  reports that she has been smoking.  She does not have any smokeless tobacco history on file. She reports that she does not drink alcohol or use illicit drugs.  REVIEW OF SYSTEMS:   Bolds are positive  Constitutional: weight loss, gain, night sweats, Fevers, chills, fatigue .  HEENT: headaches, Sore throat, sneezing, nasal congestion, post nasal drip, Difficulty swallowing, Tooth/dental problems, visual complaints visual changes, ear ache CV:  chest pain, radiates: ,Orthopnea, PND, swelling in lower extremities, dizziness, palpitations, syncope.  GI  heartburn, indigestion, abdominal pain, bilateral flank, nausea, vomiting, diarrhea, change in bowel habits, loss of appetite, bloody stools.  Resp: cough, productive: , hemoptysis, dyspnea, chest pain, pleuritic.  Skin: rash or itching or icterus GU: dysuria, change in color of urine, urgency or frequency. flank pain, hematuria  MS: joint pain or swelling. decreased range of motion  Psych: change in mood or affect. depression or anxiety.  Neuro: difficulty with speech, weakness, numbness, ataxia    SUBJECTIVE:   VITAL SIGNS: Temp:  [99.2 F (37.3 C)] 99.2 F (37.3 C) (04/13 2232) Pulse Rate:  [84-116] 86 (04/14 0115) Resp:  [18-40] 32 (04/14 0115) BP: (130-180)/(77-109) 130/97 mmHg (04/14 0115) SpO2:  [78 %-100 %] 98 % (04/14 0115) FiO2 (%):  [60 %] 60 % (04/13 2340) Weight:  [99.791 kg (220 lb)] 99.791 kg (220 lb) (04/13 2232) HEMODYNAMICS:   VENTILATOR SETTINGS: Vent Mode:  [-]  FiO2 (%):  [60 %] 60 % INTAKE / OUTPUT: No intake  or output data in the 24 hours ending 03/05/15 0237  PHYSICAL EXAMINATION: General:  Obese female in NAD on BiPAP Neuro:  Alert, oriented, non-focal HEENT:  [place]/AT, no JVD noted, PERRL Cardiovascular:  RRR, no MRG Lungs:  Diffusely coarse  Abdomen:  Soft, generally tender, non-distended Musculoskeletal:  No acute deformity or ROM limitation Skin:  Grossly intact  LABS:  CBC  Recent Labs Lab 03/04/15 2250  WBC 12.6*  HGB 13.9  HCT 42.4  PLT 249   Coag's No results for input(s): APTT, INR in the last 168 hours. BMET  Recent Labs Lab 03/04/15 2250  NA 139  K 3.1*  CL 106  CO2 21  BUN 6  CREATININE 0.74  GLUCOSE 155*   Electrolytes  Recent Labs Lab 03/04/15 2250  CALCIUM 9.2   Sepsis Markers No results for input(s): LATICACIDVEN, PROCALCITON, O2SATVEN in the last 168 hours. ABG  Recent Labs Lab 03/04/15 2350  PHART 7.402  PCO2ART 34.4*  PO2ART 55.0*   Liver Enzymes  Recent Labs Lab 03/04/15 2250  AST 23  ALT 16  ALKPHOS 65  BILITOT 0.6  ALBUMIN 3.7   Cardiac Enzymes No results for input(s): TROPONINI, PROBNP in the last 168 hours. Glucose No results for input(s): GLUCAP in the last 168 hours.  Imaging Dg Chest Portable 1 View  03/04/2015   CLINICAL DATA:  Chest pain and shortness of breath. Spitting up blood. Possible aspiration at the dentist.  EXAM: PORTABLE CHEST - 1 VIEW  COMPARISON:  01/09/2014  FINDINGS: Diffuse patchy lung opacity, airspace type. No cephalized blood flow or pleural effusion.  There is chronic mild cardiomegaly. Negative aortic and hilar contours.  IMPRESSION: Diffuse airspace disease favoring multi focal pneumonia. Given the history, alveolar hemorrhage or widespread aspiration are the main differential considerations.   Electronically Signed   By: Monte Fantasia M.D.   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 -Telemetry monitoring -Strict I&O -Daily weights -Check BNP  Hypokalemia, suspect lasix induced  -Replace K 54mq -Bmet in AM -KVO IVF  Hyperglycemia without history of diabetes -Follow glucose on chemistry   PGeorgann Housekeeper AGACNP-BC [place] [facility] Pager [phone] ([phone] 03/05/2015 4:06 AM   Attending:  I have seen and examined the patient with nurse practitioner/resident and agree with the note above.   [place]  Ms. DQianhas severe heart disease and [facility]  Yesterday she felt well going into the dentist with the exception of some mild dyspnea consistent with her recent symptoms.  However she remembers aspirating fluid (blood, water) while lying flat.  She then had progressive dyspnea.    On exam: her lungs are surprisingly clear despite her CXR and she has normal respiratory effort, systolic murmur noted and trace ankle edema  CXR reviewed> improving bilateral airspace disease by my review  Aspiration pneumonitis> improving, no cough, feels great, do not feel this is infectious so agree with holding off on antibiotics, will repeat 2 view CXR in AM CHF with aortic valve disease and mitral valve disease> still in exacerbation, diurese again today Acute hypoxemic respiratory failure> ambulate today, wean OK  Transfer to floor, anticipate d/c in AM  BRoselie Awkward MD LD[facility]PCCM Pager: [phone]: [phone] no response, call  [phone]

## 2015-03-05 NOTE — Progress Notes (Signed)
Called ELink and spoke with MD Deterding regarding patient's BP running 180-170/100-90, patient also complaining of abdominal pain believed to be related to gas, and back pain related to sciatica.  New orders received.  Vicie Mutters, RN

## 2015-03-05 NOTE — Care Management Note (Addendum)
    Page 1 of 1   03/06/2015     11:21:52 AM CARE MANAGEMENT NOTE 03/06/2015  Patient:  [facility]   Account Number:  [id number]  Date Initiated:  03/05/2015  Documentation initiated by:  Elissa Hefty  Subjective/Objective Assessment:   adm w resp failure     Action/Plan:   lives w husband, pcp dr Sharol Given   Anticipated DC Date:  03/06/2015   Anticipated DC Plan:  [place]  CM consult      Choice offered to / List presented to:             Status of service:  Completed, signed off Medicare Important Message given?   (If response is "NO", the following Medicare IM given date fields will be blank) Date Medicare IM given:   Medicare IM given by:   Date Additional Medicare IM given:   Additional Medicare IM given by:    Discharge Disposition:  HOME/SELF CARE  Per UR Regulation:  Reviewed for med. necessity/level of care/duration of stay  If discussed at [place] of Stay Meetings, dates discussed:    Comments:  03-06-15 discharged to home no CM needs at this time. Carles Collet RN BSN CM

## 2015-03-05 NOTE — Progress Notes (Signed)
eLink Physician-Brief Progress Note Patient Name: Shelly Huynh DOB: Sep 11, 1971 MRN: [id number]   Date of Service  03/05/2015  HPI/Events of Note  Hypertension Abd pain felt secondary to gas Sciatica  eICU Interventions  Plan: PRN hydralazine Simethicone One time dose of Norco     Intervention Category Intermediate Interventions: Hypertension - evaluation and management;Pain - evaluation and management Minor Interventions: Routine modifications to care plan (e.g. PRN medications for pain, fever)  Laquetta Racey 03/05/2015, 6:18 AM

## 2015-03-06 ENCOUNTER — Inpatient Hospital Stay (HOSPITAL_COMMUNITY): Payer: Managed Care, Other (non HMO)

## 2015-03-06 MED ORDER — MUPIROCIN 2 % EX OINT: TOPICAL_OINTMENT | Freq: Two times a day (BID) | CUTANEOUS | Status: AC

## 2015-03-06 NOTE — Discharge Summary (Signed)
Physician Discharge Summary  Patient ID: Shelly Huynh MRN: [id number] DOB/AGE: 45/23/1972 45 y.o.  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  Suspect cxr findings were related to pulmonary edema given rapid resolution of symptoms & clearance of CXR with IV lasix.    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  Surgery postponed 4 weeks  Hypokalemia   Discharge Plan: Resume prior home potassium replacement   Hyperglycemia   Discharge Plan: Resolved, no further acute follow up    MRSA PCR Positive   Discharge Plan: Complete 7 days of nasal Bactroban BID                        DISCHARGE SUMMARY   Shelly Huynh is a 44 y.o. 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   At  baseline, she takes lasix PRN for LE edema, and had been using it more than normal the week prior to admission. [name] typically unable to lay flat due to SOB and sleeps with at least 2-3 pillows. She went to the dentist 4/14 to have a filling placed and was in her usual state of health. While at the dentist she was laying down flat and had a sip of water which she feels as though "went down the wrong pipe". Shortly after, she developed cough and SOB while laying flat. This continued for several hours. She developed hemoptysis as well which she described as only occuring with the deepest coughs, and was usually mixed with mucous.  In the ED she was hypoxic with PaO2 55 on [place]. CXR evaluation showed diffuse bilateral infiltrates. She was placed on BiPAP with great improvement.    The patient was admitted for observation.  She was continued on bipap as needed and treated with a one time dose of IV lasix.  Symptoms resolved and patient was titrated to room air without further respiratory difficulties.  Nasal MRSA PCR was positive and patient was treated with Bactroban.  Repeat CXR on 4/15 showed significant clearing of bilateral infiltrates (consistent with pulmonary edema).  The patient  was medically cleared for discharge 4/15 with plans as above.       SIGNIFICANT DIAGNOSTIC STUDIES Echo 12/30 > LVEF 55%, severe MR, moderate TR and AR, moderate mitral stenosis, PA peak pressure 62.1    Discharge Exam: General: obese F in NAD Neuro: AAOx4, speech clear, MAE CV: Z6X0 rrr, soft systolic murmur PULM: resp's even/non-labored on RA, lungs bilaterally clear GI: round/soft, bsx4 active  Extremities: warm/dry, no edema   Filed Vitals:   03/05/15 2139 03/05/15 2226 03/06/15 0500 03/06/15 0620  BP: 155/89   121/76  Pulse: 84 80    Temp: 98.6 F (37 C)   98.4 F (36.9 C)  TempSrc: Oral   Oral  Resp: _0 Height:      Weight:   212 lb 1.6 oz (96.208 kg)   SpO2: 97% 98%  96%     Discharge  Labs  BMET  Recent Labs Lab 03/04/15 2250 03/05/15 0437  NA 139 140  K 3.1* 3.4*  CL 106 108  CO2 21 25  GLUCOSE 155* 114*  BUN 6 7  CREATININE 0.74 0.66  CALCIUM 9.2 8.9  MG  --  2.2  PHOS  --  3.6   CBC  Recent Labs Lab 03/04/15 2250 03/05/15 0437  HGB 13.9 13.4  HCT 42.4 40.6  WBC 12.6* 15.8*  PLT 249 248        Follow-up Information    Follow up with Vevelyn Francois, NP.   Specialty:  Nurse Practitioner   Why:  As needed   Contact information:   [place] [address] [phone]          Medication List    TAKE these medications        amoxicillin 500 MG capsule  Commonly known as:  AMOXIL  Take 2,000 mg by mouth as needed (for dental procedure).     Biotin w/ Vitamins C & E 1250-7.5-7.5 MCG-MG-UNT Chew  Chew 2 tablets by mouth daily.     cyclobenzaprine 5 MG tablet  Commonly known as:  FLEXERIL  Take 5 mg by mouth at bedtime.     diazepam 5 MG tablet  Commonly known as:  VALIUM  Take 5 mg by mouth 2 (two) times daily as needed for anxiety.     Fish Oil 1000 MG Caps  Take 1,000 mg by mouth daily.     furosemide 20 MG tablet  Commonly known as:  LASIX  Take 20 mg by mouth daily.     Garlic 8295 MG Caps  Take 1,000 mg by mouth daily.     hydrALAZINE 25 MG tablet  Commonly known as:  APRESOLINE  Take 25 mg by mouth 2 (two) times daily.     HYDROcodone-acetaminophen 5-325 MG per tablet  Commonly known as:  NORCO/VICODIN  Take 1 tablet by mouth 2 (two) times daily as needed for moderate pain.     Linaclotide 290 MCG Caps capsule  Commonly known as:  LINZESS  Take 290 mcg by mouth daily.     meclizine 25 MG tablet  Commonly known as:  ANTIVERT  Take 25 mg by mouth every 6 (six) hours as needed for dizziness.     meloxicam 15 MG tablet  Commonly known as:  MOBIC  Take 15 mg by mouth daily as needed for pain.     mupirocin ointment 2 %  Commonly known as:  BACTROBAN  Place into the nose 2 (two) times  daily.  omeprazole 20 MG capsule  Commonly known as:  PRILOSEC  Take 20 mg by mouth daily.     potassium chloride 10 MEQ tablet  Commonly known as:  K-DUR  Take 10 mEq by mouth daily.     pravastatin 20 MG tablet  Commonly known as:  PRAVACHOL  Take 20 mg by mouth daily.     verapamil 120 MG CR tablet  Commonly known as:  CALAN-SR  Take 120 mg by mouth daily.          Disposition: Home, no new home health needs identified.    Discharged Condition: Skyra Crichlow has met maximum benefit of inpatient care and is medically stable and cleared for discharge.  Patient is pending follow up as above.      Time spent on disposition:  Greater than 35 minutes.   Signed: Noe Gens, NP-C [facility] Pgr: [phone] Office: [phone]  Patient seen and examined, agree with above note.  I dictated the care and orders written for this patient under my direction.  Rush Farmer, MD [phone]

## 2015-03-06 NOTE — Progress Notes (Signed)
Reviewed discharge instructions with patient. Patient understood and did teach back successfully.  11:33 AM Carmela Hurt, RN .

## 2015-03-12 DIAGNOSIS — IMO0001 Reserved for inherently not codable concepts without codable children: Secondary | ICD-10-CM | POA: Insufficient documentation

## 2015-03-12 DIAGNOSIS — Z531 Procedure and treatment not carried out because of patient's decision for reasons of belief and group pressure: Secondary | ICD-10-CM | POA: Insufficient documentation

## 2015-04-03 DIAGNOSIS — Z9889 Other specified postprocedural states: Secondary | ICD-10-CM | POA: Insufficient documentation

## 2015-04-03 DIAGNOSIS — Z952 Presence of prosthetic heart valve: Secondary | ICD-10-CM | POA: Insufficient documentation

## 2015-04-03 DIAGNOSIS — Z953 Presence of xenogenic heart valve: Secondary | ICD-10-CM | POA: Insufficient documentation

## 2015-04-03 HISTORY — PX: MITRAL VALVE REPLACEMENT: SHX147

## 2015-04-03 HISTORY — PX: AORTIC VALVE REPLACEMENT: SHX41

## 2015-04-03 HISTORY — PX: TRICUSPID VALVE REPLACEMENT: SHX816

## 2015-04-04 DIAGNOSIS — I50811 Acute right heart failure: Secondary | ICD-10-CM | POA: Insufficient documentation

## 2015-04-07 DIAGNOSIS — Z952 Presence of prosthetic heart valve: Secondary | ICD-10-CM | POA: Insufficient documentation

## 2015-04-08 DIAGNOSIS — I5043 Acute on chronic combined systolic (congestive) and diastolic (congestive) heart failure: Secondary | ICD-10-CM | POA: Insufficient documentation

## 2015-04-08 DIAGNOSIS — I5042 Chronic combined systolic (congestive) and diastolic (congestive) heart failure: Secondary | ICD-10-CM | POA: Insufficient documentation

## 2015-04-26 DIAGNOSIS — D649 Anemia, unspecified: Secondary | ICD-10-CM | POA: Insufficient documentation

## 2015-05-05 DIAGNOSIS — I10 Essential (primary) hypertension: Secondary | ICD-10-CM | POA: Insufficient documentation

## 2015-05-07 DIAGNOSIS — Z7901 Long term (current) use of anticoagulants: Secondary | ICD-10-CM | POA: Insufficient documentation

## 2015-05-18 DIAGNOSIS — R002 Palpitations: Secondary | ICD-10-CM | POA: Insufficient documentation

## 2015-05-29 ENCOUNTER — Other Ambulatory Visit: Payer: Self-pay

## 2015-05-29 ENCOUNTER — Emergency Department
Admission: EM | Admit: 2015-05-29 | Discharge: 2015-05-29 | Disposition: A | Discharge status: Home / Self Care | Payer: Managed Care, Other (non HMO) | Attending: Emergency Medicine | Admitting: Emergency Medicine

## 2015-05-29 ENCOUNTER — Encounter: Payer: Self-pay | Admitting: *Deleted

## 2015-05-29 DIAGNOSIS — I10 Essential (primary) hypertension: Secondary | ICD-10-CM | POA: Diagnosis not present

## 2015-05-29 DIAGNOSIS — Z79899 Other long term (current) drug therapy: Secondary | ICD-10-CM | POA: Insufficient documentation

## 2015-05-29 DIAGNOSIS — R079 Chest pain, unspecified: Secondary | ICD-10-CM | POA: Diagnosis present

## 2015-05-29 DIAGNOSIS — Z792 Long term (current) use of antibiotics: Secondary | ICD-10-CM | POA: Insufficient documentation

## 2015-05-29 DIAGNOSIS — Z720 Tobacco use: Secondary | ICD-10-CM | POA: Insufficient documentation

## 2015-05-29 LAB — BASIC METABOLIC PANEL
Anion gap: 10 (ref 5–15)
BUN: 7 mg/dL (ref 6–20)
CALCIUM: 9.5 mg/dL (ref 8.9–10.3)
CO2: 23 mmol/L (ref 22–32)
Chloride: 105 mmol/L (ref 101–111)
Creatinine, Ser: 0.75 mg/dL (ref 0.44–1.00)
GFR calc Af Amer: >60 mL/min (ref >60)
Glucose, Bld: 141 mg/dL — ABNORMAL HIGH (ref 65–99)
POTASSIUM: 3.4 mmol/L — AB (ref 3.5–5.1)
Sodium: 138 mmol/L (ref 135–145)

## 2015-05-29 LAB — TROPONIN I

## 2015-05-29 LAB — APTT: APTT: 41 s — AB (ref 24–36)

## 2015-05-29 LAB — CBC
HEMATOCRIT: 36.1 % (ref 35.0–47.0)
Hemoglobin: 11.5 g/dL — ABNORMAL LOW (ref 12.0–16.0)
MCH: 26.8 pg (ref 26.0–34.0)
MCHC: 31.7 g/dL — ABNORMAL LOW (ref 32.0–36.0)
MCV: 84.4 fL (ref 80.0–100.0)
PLATELETS: 315 10*3/uL (ref 150–440)
RBC: 4.28 MIL/uL (ref 3.80–5.20)
RDW: 17.3 % — ABNORMAL HIGH (ref 11.5–14.5)
WBC: 5.3 10*3/uL (ref 3.6–11.0)

## 2015-05-29 LAB — PROTIME-INR
INR: 1.96
PROTHROMBIN TIME: 22.5 s — AB (ref 11.4–15.0)

## 2015-05-29 MED ORDER — OXYCODONE-ACETAMINOPHEN 5-325 MG PO TABS: 1.0000 | ORAL_TABLET | Freq: Four times a day (QID) | ORAL | Status: AC | PRN

## 2015-05-29 MED ORDER — HYDRALAZINE HCL 20 MG/ML IJ SOLN
INTRAMUSCULAR | Status: AC
  Administered 2015-05-29: 5 mg via INTRAVENOUS
  Filled 2015-05-29: qty 1

## 2015-05-29 MED ORDER — HYDRALAZINE HCL 20 MG/ML IJ SOLN
5.0000 mg | Freq: Once | INTRAMUSCULAR | Status: AC
  Administered 2015-05-29: 5 mg via INTRAVENOUS

## 2015-05-29 MED ORDER — OXYCODONE HCL 5 MG PO TABS
ORAL_TABLET | ORAL | Status: AC
  Filled 2015-05-29: qty 1

## 2015-05-29 MED ORDER — OXYCODONE-ACETAMINOPHEN 5-325 MG PO TABS
1.0000 | ORAL_TABLET | Freq: Once | ORAL | Status: AC
  Administered 2015-05-29: 1 via ORAL

## 2015-05-29 NOTE — ED Notes (Signed)
June 3 had sterum infection for heart surgery, has some chest tightness

## 2015-05-29 NOTE — ED Notes (Addendum)
Pt reports squeezing pain and pressure in her chest x 4 days intermittently. Today she states that the pain and pressure has been more constant. She had aortic and mitral valve and tricuspid replacements on May 13. Pt states her blood pressure has spiked over last 4 days as well. Pt is alert & oriented with warm, dry skin. Pt is on coumadin and has restarted taking her verapamil and hyrolazine on her own, as her surgeon has not restarted those yet.

## 2015-05-29 NOTE — ED Notes (Signed)
Pt discharged home after verbalizing understanding of discharge instructions; nad noted. 

## 2015-05-29 NOTE — ED Provider Notes (Signed)
[facility] Emergency Department Provider Note  ____________________________________________  Time seen: On arrival  I have reviewed the triage vital signs and the nursing notes.   HISTORY  Chief Complaint Chest Pain    HPI Shelly Huynh is a 44 y.o. female who presents with chest tightness for 4 days. Patient with significant medical history including tricuspid, aortic and mitral valve replacement at [facility] on May 13. she is on Coumadin. She reports the pain is mild and in the center of her chest. She feels this is related to elevated blood pressure because she has not been taking her pressure medications since the surgery. She has not been taking them because she was not told to restart them. She actually reports she has no chest discomfort in the emergency department and it stopped Prior to arrival. She denies shortness of breath, fevers, chills. She admits that she has been taking her blood pressure medication for the past 2 days to try to get her blood pressure control again.     Past Medical History  Diagnosis Date  . Hypertension   . High cholesterol   . Anxiety   . IBS (irritable bowel syndrome)   . Acid reflux   . Sciatic pain   . Arthritis   . High cholesterol   . Hypokalemia     Patient Active Problem List   Diagnosis Date Noted  . Acute respiratory failure with hypoxemia 03/05/2015    Past Surgical History  Procedure Laterality Date  . Abdominal hysterectomy    . Cesarean section    . Hernia repair    . Cardiac valve surgery      Current Outpatient Rx  Name  Route  Sig  Dispense  Refill  . amoxicillin (AMOXIL) 500 MG capsule   Oral   Take 2,000 mg by mouth as needed (for dental procedure).          . Biotin w/ Vitamins C & E 1250-7.5-7.5 MCG-MG-UNT CHEW   Oral   Chew 2 tablets by mouth daily.         . cyclobenzaprine (FLEXERIL) 5 MG tablet   Oral   Take 5 mg by mouth at bedtime.          . diazepam (VALIUM) 5 MG  tablet   Oral   Take 5 mg by mouth 2 (two) times daily as needed for anxiety.         Marland Kitchen EXPIRED: furosemide (LASIX) 20 MG tablet   Oral   Take 20 mg by mouth daily.          . Garlic 6256 MG CAPS   Oral   Take 1,000 mg by mouth daily.         . hydrALAZINE (APRESOLINE) 25 MG tablet   Oral   Take 25 mg by mouth 2 (two) times daily.          Marland Kitchen HYDROcodone-acetaminophen (NORCO/VICODIN) 5-325 MG per tablet   Oral   Take 1 tablet by mouth 2 (two) times daily as needed for moderate pain.         . Linaclotide (LINZESS) 290 MCG CAPS capsule   Oral   Take 290 mcg by mouth daily.          . meclizine (ANTIVERT) 25 MG tablet   Oral   Take 25 mg by mouth every 6 (six) hours as needed for dizziness.         . meloxicam (MOBIC) 15 MG tablet   Oral  Take 15 mg by mouth daily as needed for pain.         . Omega-3 Fatty Acids (FISH OIL) 1000 MG CAPS   Oral   Take 1,000 mg by mouth daily.         Marland Kitchen omeprazole (PRILOSEC) 20 MG capsule   Oral   Take 20 mg by mouth daily.          . potassium chloride (K-DUR) 10 MEQ tablet   Oral   Take 10 mEq by mouth daily.          . pravastatin (PRAVACHOL) 20 MG tablet   Oral   Take 20 mg by mouth daily.          . verapamil (CALAN-SR) 120 MG CR tablet   Oral   Take 120 mg by mouth daily.            Allergies Ivp dye; Hctz; Sulfa antibiotics; Lisinopril; and Mucinex  No family history on file.  Social History History  Substance Use Topics  . Smoking status: Current Every Day Smoker  . Smokeless tobacco: Not on file  . Alcohol Use: No    Review of Systems  Constitutional: Negative for fever. Eyes: Negative for visual changes. ENT: Negative for sore throat Cardiovascular: Positive for chest tightness Respiratory: Negative for shortness of breath. Gastrointestinal: Negative for abdominal pain, vomiting and diarrhea. Genitourinary: Negative for dysuria. Musculoskeletal: Negative for back pain. Skin:  Negative for rash. Neurological: Negative for headaches or focal weakness Psychiatric: No anxiety  10-point ROS otherwise negative.  ____________________________________________   PHYSICAL EXAM:  VITAL SIGNS: ED Triage Vitals  Enc Vitals Group     BP 05/29/15 1848 184/112 mmHg     Pulse Rate 05/29/15 1848 99     Resp 05/29/15 1848 24     Temp 05/29/15 1848 98.4 F (36.9 C)     Temp Source 05/29/15 1848 Oral     SpO2 05/29/15 1848 99 %     Weight 05/29/15 1848 211 lb (95.709 kg)     Height 05/29/15 1848 5' 2"  (1.575 m)     Head Cir --      Peak Flow --      Pain Score 05/29/15 1850 6     Pain Loc --      Pain Edu? --      Excl. in [place]? --      Constitutional: Alert and oriented. Well appearing and in no distress. Eyes: Conjunctivae are normal.  ENT   Head: Normocephalic and atraumatic.   Mouth/Throat: Mucous membranes are moist. Cardiovascular: Normal rate, regular rhythm. Normal and symmetric distal pulses are present in all extremities. Mechanical clicking of valves heard while speaking the patient. No peripheral edema, no JVD. I'll tenderness to palpation of sternum likely related to sternal infection is being treated at [facility]. Respiratory: Normal respiratory effort without tachypnea nor retractions. Breath sounds are clear and equal bilaterally.  Gastrointestinal: Soft and non-tender in all quadrants. No distention. There is no CVA tenderness. Genitourinary: deferred Musculoskeletal: Nontender with normal range of motion in all extremities. No lower extremity tenderness nor edema. Neurologic:  Normal speech and language. No gross focal neurologic deficits are appreciated. Skin:  Skin is warm, dry and intact. No rash noted. Psychiatric: Mood and affect are normal. Patient exhibits appropriate insight and judgment.  ____________________________________________    LABS (pertinent positives/negatives)  Labs Reviewed  CBC - Abnormal; Notable for the following:     Hemoglobin 11.5 (*)  MCHC 31.7 (*)    RDW 17.3 (*)    All other components within normal limits  BASIC METABOLIC PANEL - Abnormal; Notable for the following:    Potassium 3.4 (*)    Glucose, Bld 141 (*)    All other components within normal limits  PROTIME-INR - Abnormal; Notable for the following:    Prothrombin Time 22.5 (*)    All other components within normal limits  APTT - Abnormal; Notable for the following:    aPTT 41 (*)    All other components within normal limits  TROPONIN I    ____________________________________________   EKG  ED ECG REPORT I, Lavonia Drafts, the attending physician, personally viewed and interpreted this ECG.   Date: 05/29/2015  EKG Time: 6:50 PM  Rate: 95  Rhythm: normal sinus rhythm  Axis: Normal axis  Intervals:none  ST&T Change: Nonspecific   ____________________________________________    RADIOLOGY I have personally reviewed any xrays that were ordered on this patient: None  ____________________________________________   PROCEDURES  Procedure(s) performed: none  Critical Care performed: none  ____________________________________________   INITIAL IMPRESSION / ASSESSMENT AND PLAN / ED COURSE  Pertinent labs & imaging results that were available during my care of the patient were reviewed by me and considered in my medical decision making (see chart for details).  Patient well-appearing and has no chest pain in the emergency department. We will check labs and discuss with cardiology. Also give IV hydralazine to lower her blood pressure  ----------------------------------------- 10:51 PM on 05/29/2015 -----------------------------------------  INR is slightly low however patient is taking Keflex for infection may be interfering with the Coumadin so we will keep her on her dose for now. Discussed with Dr. Ubaldo Glassing of cardiology he recommends restarting blood pressure medications and following up with cardiology in 2  days.  Given that patient is chest pain-free, with a normal troponin and an unremarkable EKG and her blood pressure is improved I believe she is okay for discharge. Strict return precautions discussed with patient and she agrees to return to the ED if any return of her discomfort/pain  ____________________________________________   FINAL CLINICAL IMPRESSION(S) / ED DIAGNOSES  Final diagnoses:  Chest pain, unspecified chest pain type     Lavonia Drafts, MD 05/29/15 2255

## 2015-05-29 NOTE — Discharge Instructions (Signed)

## 2015-06-15 DIAGNOSIS — E876 Hypokalemia: Secondary | ICD-10-CM | POA: Insufficient documentation

## 2015-06-16 ENCOUNTER — Ambulatory Visit: Payer: Managed Care, Other (non HMO)

## 2015-06-29 ENCOUNTER — Encounter: Payer: Managed Care, Other (non HMO) | Attending: Internal Medicine | Admitting: *Deleted

## 2015-06-29 VITALS — Ht 63.25 in | Wt 222.6 lb

## 2015-06-29 DIAGNOSIS — Z9889 Other specified postprocedural states: Secondary | ICD-10-CM | POA: Insufficient documentation

## 2015-06-29 DIAGNOSIS — Z952 Presence of prosthetic heart valve: Secondary | ICD-10-CM

## 2015-06-29 DIAGNOSIS — Z954 Presence of other heart-valve replacement: Secondary | ICD-10-CM | POA: Diagnosis present

## 2015-06-30 DIAGNOSIS — F17200 Nicotine dependence, unspecified, uncomplicated: Secondary | ICD-10-CM | POA: Insufficient documentation

## 2015-06-30 DIAGNOSIS — E669 Obesity, unspecified: Secondary | ICD-10-CM | POA: Insufficient documentation

## 2015-06-30 NOTE — Patient Instructions (Signed)
Patient Instructions  Patient Details  Name: Shelly Huynh MRN: [id number] Date of Birth: 05/31/1971 Referring Provider:  Corey Skains, MD  Below are the personal goals you chose as well as exercise and nutrition goals. Our goal is to help you keep on track towards obtaining and maintaining your goals. We will be discussing your progress on these goals with you throughout the program.  Initial Exercise Prescription:     Initial Exercise Prescription - 06/29/15 1500    Date of Initial Exercise Prescription   Date 06/29/15   Treadmill   MPH 2.3   Grade 0   Minutes 15   Bike   Level 1.2   Minutes 15   Recumbant Bike   Level 2   RPM 40   Watts 30   Minutes 15   NuStep   Level 3   Watts 30   Minutes 15   Arm Ergometer   Level 1   Watts 10   Minutes 15   Arm/Foot Ergometer   Level 1   Watts 15   Minutes 15   Cybex   Level 2   RPM 40   Minutes 15   Recumbant Elliptical   Level 1   RPM 30   Watts 15   Minutes 15   Elliptical   Level 1   Speed 3   Minutes 5   REL-XR   Level 3   Watts 30   Minutes 15   Prescription Details   Frequency (times per week) 3   Duration Progress to 30 minutes of continuous aerobic without signs/symptoms of physical distress   Intensity   THHR 40-80% of Max Heartrate 120-140   Ratings of Perceived Exertion 11-13   Progression Continue progressive overload as per policy without signs/symptoms or physical distress.   Resistance Training   Training Prescription Yes   Weight 2   Reps 10-12      Exercise Goals: Frequency: Be able to perform aerobic exercise three times per week working toward 3-5 days per week.  Intensity: Work with a perceived exertion of 11 (fairly light) - 15 (hard) as tolerated. Follow your new exercise prescription and watch for changes in prescription as you progress with the program. Changes will be reviewed with you when they are made.  Duration: You should be able to do 30 minutes of continuous  aerobic exercise in addition to a 5 minute warm-up and a 5 minute cool-down routine.  Nutrition Goals: Your personal nutrition goals will be established when you do your nutrition analysis with the dietician.  The following are nutrition guidelines to follow: Cholesterol < 259m/day Sodium < 15062mday Fiber: Women under 50 yrs - 25 grams per day  Personal Goals:     Personal Goals and Risk Factors at Admission - 06/29/15 1327    Personal Goals and Risk Factors on Admission    Weight Management Yes;Obesity   Intervention Learn and follow the exercise and diet guidelines while in the program. Utilize the nutrition and education classes to help gain knowledge of the diet and exercise expectations in the program   Intervention Provide weight management tools through evaluation completed by registered dietician and exercise physiologist.  Establish a goal weight with participant.   Goal Weight 160 lb (72.576 kg)   Increase Aerobic Exercise and Physical Activity Yes;Sedentary   Intervention While in program, learn and follow the exercise prescription taught. Start at a low level workload and increase workload after able to maintain previous level for 30  minutes. Increase time before increasing intensity.   Intervention Provide exercise education and an individualized exercise prescription that will provide continued progressive overload as per policy without signs/symptoms of physical distress.   Quit Smoking Yes   Number of packs per day 1 pack per week       Intervention Utilize your health care professional team to help with smoking cessation while in the program. Your doctor can prescribe medications to aid in cessation. The program can provide information and counseling as needed.   Take Less Medication Yes   Intervention Learn your risk factors and begin the lifestyle modifications for risk factor control during your time in the program.   Understand more about Heart/Pulmonary Disease.  Yes   Intervention While in program utilize professionals for any questions, and attend the education sessions. Great websites to use are www.americanheart.org or www.lung.org for reliable information.   Improve shortness of breath with ADL's Yes   Intervention While in program, learn and follow the exercise prescription taught. Start at a low level workload and increase workload ad advised by the exercise physiologist. Increase time before increasing intensity.   Diabetes No   Hypertension Yes   Goal Participant will see blood pressure controlled within the values of 140/28m/Hg or within value directed by their physician.   Intervention Provide nutrition & aerobic exercise along with prescribed medications to achieve BP 140/90 or less.   Lipids Yes   Goal Cholesterol controlled with medications as prescribed, with individualized exercise RX and with personalized nutrition plan. Value goals: LDL < 755m HDL > 40106mParticipant states understanding of desired cholesterol values and following prescriptions.   Intervention Provide nutrition & aerobic exercise along with prescribed medications to achieve LDL <25m79mDL >40mg28mStress Yes   Goal To meet with psychosocial counselor for stress and relaxation information and guidance. To state understanding of performing relaxation techniques and or identifying personal stressors.      Tobacco Use Initial Evaluation: History  Smoking status  . Current Every Day Smoker  Smokeless tobacco  . Not on file    Copy of goals given to participant.

## 2015-06-30 NOTE — Progress Notes (Signed)
Cardiac Individual Treatment Plan  Patient Details  Name: Shelly Huynh MRN: [id number] Date of Birth: 09/28/1971 Referring Provider:  Corey Skains, MD  Initial Encounter Date: Date: 06/29/15  Visit Diagnosis: S/P aortic valve replacement  S/P mitral valve replacement  S/P tricuspid valve repair  Patient's Home Medications on Admission:  Current outpatient prescriptions:  .  albuterol (PROAIR HFA) 108 (90 BASE) MCG/ACT inhaler, Inhale into the lungs., Disp: , Rfl:  .  aspirin 81 MG chewable tablet, Chew by mouth., Disp: , Rfl:  .  carvedilol (COREG) 3.125 MG tablet, Take by mouth., Disp: , Rfl:  .  furosemide (LASIX) 40 MG tablet, Take by mouth., Disp: , Rfl:  .  gabapentin (NEURONTIN) 300 MG capsule, Take by mouth., Disp: , Rfl:  .  hydrALAZINE (APRESOLINE) 25 MG tablet, Take by mouth., Disp: , Rfl:  .  Linaclotide (LINZESS) 290 MCG CAPS capsule, Take by mouth., Disp: , Rfl:  .  meclizine (ANTIVERT) 25 MG tablet, Take by mouth., Disp: , Rfl:  .  potassium chloride (MICRO-K) 10 MEQ CR capsule, Take by mouth., Disp: , Rfl:  .  verapamil (CALAN-SR) 120 MG CR tablet, Take by mouth., Disp: , Rfl:  .  amoxicillin (AMOXIL) 500 MG capsule, Take 2,000 mg by mouth as needed (for dental procedure). , Disp: , Rfl:  .  Biotin w/ Vitamins C & E 1250-7.5-7.5 MCG-MG-UNT CHEW, Chew 2 tablets by mouth daily., Disp: , Rfl:  .  buPROPion (WELLBUTRIN) 100 MG tablet, Take by mouth., Disp: , Rfl:  .  cyclobenzaprine (FLEXERIL) 5 MG tablet, Take 5 mg by mouth at bedtime. , Disp: , Rfl:  .  diazepam (VALIUM) 5 MG tablet, Take by mouth., Disp: , Rfl:  .  furosemide (LASIX) 20 MG tablet, Take 20 mg by mouth daily. , Disp: , Rfl:  .  HYDROcodone-acetaminophen (NORCO/VICODIN) 5-325 MG per tablet, Take 1 tablet by mouth 2 (two) times daily as needed for moderate pain., Disp: , Rfl:  .  meloxicam (MOBIC) 15 MG tablet, Take 15 mg by mouth daily as needed for pain., Disp: , Rfl:  .  omeprazole (PRILOSEC)  20 MG capsule, Take 20 mg by mouth daily. , Disp: , Rfl:  .  oxyCODONE-acetaminophen (ROXICET) 5-325 MG per tablet, Take 1 tablet by mouth every 6 (six) hours as needed., Disp: 20 tablet, Rfl: 0 .  pravastatin (PRAVACHOL) 20 MG tablet, Take 20 mg by mouth daily. , Disp: , Rfl:   Past Medical History: Past Medical History  Diagnosis Date  . Hypertension   . High cholesterol   . Anxiety   . IBS (irritable bowel syndrome)   . Acid reflux   . Sciatic pain   . Arthritis   . High cholesterol   . Hypokalemia     Tobacco Use: History  Smoking status  . Current Every Day Smoker  Smokeless tobacco  . Not on file    Labs: Recent Review Flowsheet Data    Labs for ITP Cardiac and Pulmonary Rehab Latest Ref Rng 03/04/2015   PHART 7.350 - 7.450 7.402   PCO2ART 35.0 - 45.0 mmHg 34.4(L)   HCO3 20.0 - 24.0 mEq/L 21.3   TCO2 0 - 100 mmol/L 22   ACIDBASEDEF 0.0 - 2.0 mmol/L 3.0(H)   O2SAT - 88.0       Exercise Target Goals: Date: 06/29/15  Exercise Program Goal: Individual exercise prescription set with THRR, safety & activity barriers. Participant demonstrates ability to understand and report RPE using BORG scale,  to self-measure pulse accurately, and to acknowledge the importance of the exercise prescription.  Exercise Prescription Goal: Starting with aerobic activity 30 plus minutes a day, 3 days per week for initial exercise prescription. Provide home exercise prescription and guidelines that participant acknowledges understanding prior to discharge.  Activity Barriers & Risk Stratification:     Activity Barriers & Risk Stratification - 06/29/15 1322    Activity Barriers & Risk Stratification   Activity Barriers Arthritis;Back Problems;Neck/Spine Problems;Other (comment)   Comments Vertigo   Risk Stratification High      6 Minute Walk:     6 Minute Walk      06/29/15 1517       6 Minute Walk   Phase Initial     Distance 1350 feet     Walk Time 6 minutes      Resting HR 84 bpm     Resting BP 142/90 mmHg     Max Ex. HR 109 bpm     Max Ex. BP 160/86 mmHg     RPE 11        Initial Exercise Prescription:     Initial Exercise Prescription - 06/29/15 1500    Date of Initial Exercise Prescription   Date 06/29/15   Treadmill   MPH 2.3   Grade 0   Minutes 15   Bike   Level 1.2   Minutes 15   Recumbant Bike   Level 2   RPM 40   Watts 30   Minutes 15   NuStep   Level 3   Watts 30   Minutes 15   Arm Ergometer   Level 1   Watts 10   Minutes 15   Arm/Foot Ergometer   Level 1   Watts 15   Minutes 15   Cybex   Level 2   RPM 40   Minutes 15   Recumbant Elliptical   Level 1   RPM 30   Watts 15   Minutes 15   Elliptical   Level 1   Speed 3   Minutes 5   REL-XR   Level 3   Watts 30   Minutes 15   Prescription Details   Frequency (times per week) 3   Duration Progress to 30 minutes of continuous aerobic without signs/symptoms of physical distress   Intensity   THHR 40-80% of Max Heartrate 120-140   Ratings of Perceived Exertion 11-13   Progression Continue progressive overload as per policy without signs/symptoms or physical distress.   Resistance Training   Training Prescription Yes   Weight 2   Reps 10-12      Exercise Prescription Changes:   Discharge Exercise Prescription (Final Exercise Prescription Changes):   Nutrition:  Target Goals: Understanding of nutrition guidelines, daily intake of sodium <1565m, cholesterol <2056m calories 30% from fat and 7% or less from saturated fats, daily to have 5 or more servings of fruits and vegetables.  Biometrics:     Pre Biometrics - 06/29/15 1524    Pre Biometrics   Height 5' 3.25" (1.607 m)   Weight 222 lb 9.6 oz (100.971 kg)   Waist Circumference 43.25 inches   Hip Circumference 51 inches   Waist to Hip Ratio 0.85 %   BMI (Calculated) 39.2       Nutrition Therapy Plan and Nutrition Goals:   Nutrition Discharge: Rate Your Plate Scores:   Nutrition  Goals Re-Evaluation:   Psychosocial: Target Goals: Acknowledge presence or absence of depression, maximize coping skills, provide  positive support system. Participant is able to verbalize types and ability to use techniques and skills needed for reducing stress and depression.  Initial Review & Psychosocial Screening:     Initial Psych Review & Screening - 06/29/15 1333    Initial Review   Current issues with Current Depression;Current Anxiety/Panic;Current Psychotropic Meds;Current Stress Concerns   Source of Stress Concerns Occupation;Financial;Chronic Illness;Unable to perform yard/household activities;Unable to participate in former interests or hobbies   [place]? Yes   Comments Great support while in hospital and at home.     Barriers   Psychosocial barriers to participate in program There are no identifiable barriers or psychosocial needs.;The patient should benefit from training in stress management and relaxation.   Screening Interventions   Interventions Encouraged to exercise;Program counselor consult      Quality of Life Scores:     Quality of Life - 06/30/15 0820    Quality of Life Scores   Health/Function Pre 20 %   Socioeconomic Pre 13.64 %   Psych/Spiritual Pre 18 %   Family Pre 26.4 %   GLOBAL Pre 19.26 %      PHQ-9:     Recent Review Flowsheet Data    Depression screen [facility] 2/9 06/29/2015   Decreased Interest 1   Down, Depressed, Hopeless 1   PHQ - 2 Score 2   Altered sleeping 1   Tired, decreased energy 1   Change in appetite 1   Feeling bad or failure about yourself  1   Trouble concentrating 1   Moving slowly or fidgety/restless 1   Suicidal thoughts 0   PHQ-9 Score 8   Difficult doing work/chores Not difficult at all      Psychosocial Evaluation and Intervention:   Psychosocial Re-Evaluation:   Vocational Rehabilitation: Provide vocational rehab assistance to qualifying candidates.   Vocational Rehab  Evaluation & Intervention:     Vocational Rehab - 06/29/15 1324    Initial Vocational Rehab Evaluation & Intervention   Assessment shows need for Vocational Rehabilitation Yes   Documents faxed to [place] [facility] 06/29/15      Education: Education Goals: Education classes will be provided on a weekly basis, covering required topics. Participant will state understanding/return demonstration of topics presented.  Learning Barriers/Preferences:     Learning Barriers/Preferences - 06/29/15 1323    Learning Barriers/Preferences   Learning Barriers None   Learning Preferences Video      Education Topics: General Nutrition Guidelines/Fats and Fiber: -Group instruction provided by verbal, written material, models and posters to present the general guidelines for heart healthy nutrition. Gives an explanation and review of dietary fats and fiber.   Controlling Sodium/Reading Food Labels: -Group verbal and written material supporting the discussion of sodium use in heart healthy nutrition. Review and explanation with models, verbal and written materials for utilization of the food label.   Exercise Physiology & Risk Factors: - Group verbal and written instruction with models to review the exercise physiology of the cardiovascular system and associated critical values. Details cardiovascular disease risk factors and the goals associated with each risk factor.   Aerobic Exercise & Resistance Training: - Gives group verbal and written discussion on the health impact of inactivity. On the components of aerobic and resistive training programs and the benefits of this training and how to safely progress through these programs.   Flexibility, Balance, General Exercise Guidelines: - Provides group verbal and written instruction on the benefits of flexibility and  balance training programs. Provides general exercise guidelines with specific guidelines to those with heart or  lung disease. Demonstration and skill practice provided.   Stress Management: - Provides group verbal and written instruction about the health risks of elevated stress, cause of high stress, and healthy ways to reduce stress.   Depression: - Provides group verbal and written instruction on the correlation between heart/lung disease and depressed mood, treatment options, and the stigmas associated with seeking treatment.   Anatomy & Physiology of the Heart: - Group verbal and written instruction and models provide basic cardiac anatomy and physiology, with the coronary electrical and arterial systems. Review of: AMI, Angina, Valve disease, Heart Failure, Cardiac Arrhythmia, Pacemakers, and the ICD.   Cardiac Procedures: - Group verbal and written instruction and models to describe the testing methods done to diagnose heart disease. Reviews the outcomes of the test results. Describes the treatment choices: Medical Management, Angioplasty, or Coronary Bypass Surgery.   Cardiac Medications: - Group verbal and written instruction to review commonly prescribed medications for heart disease. Reviews the medication, class of the drug, and side effects. Includes the steps to properly store meds and maintain the prescription regimen.   Go Sex-Intimacy & Heart Disease, Get SMART - Goal Setting: - Group verbal and written instruction through game format to discuss heart disease and the return to sexual intimacy. Provides group verbal and written material to discuss and apply goal setting through the application of the S.M.A.R.T. Method.   Other Matters of the Heart: - Provides group verbal, written materials and models to describe Heart Failure, Angina, Valve Disease, and Diabetes in the realm of heart disease. Includes description of the disease process and treatment options available to the cardiac patient.   Exercise & Equipment Safety: - Individual verbal instruction and demonstration of  equipment use and safety with use of the equipment.          Cardiac Rehab from 06/29/2015 in [facility]   Date  06/29/15   Educator  SB   Instruction Review Code  2- meets goals/outcomes      Infection Prevention: - Provides verbal and written material to individual with discussion of infection control including proper hand washing and proper equipment cleaning during exercise session.      Cardiac Rehab from 06/29/2015 in [facility]   Date  06/29/15   Educator  SB   Instruction Review Code  2- meets goals/outcomes      Falls Prevention: - Provides verbal and written material to individual with discussion of falls prevention and safety.      Cardiac Rehab from 06/29/2015 in [facility] Cardiac Rehab   Date  06/29/15   Educator  SB   Instruction Review Code  2- meets goals/outcomes      Diabetes: - Individual verbal and written instruction to review signs/symptoms of diabetes, desired ranges of glucose level fasting, after meals and with exercise. Advice that pre and post exercise glucose checks will be done for 3 sessions at entry of program.    Knowledge Questionnaire Score:     Knowledge Questionnaire Score - 06/30/15 0749    Knowledge Questionnaire Score   Pre Score 23/28      Personal Goals and Risk Factors at Admission:     Personal Goals and Risk Factors at Admission - 06/29/15 1327    Personal Goals and Risk Factors on Admission    Weight Management Yes;Obesity   Intervention Learn and follow the exercise and diet guidelines while in  the program. Utilize the nutrition and education classes to help gain knowledge of the diet and exercise expectations in the program   Intervention Provide weight management tools through evaluation completed by registered dietician and exercise physiologist.  Establish a goal weight with participant.   Goal Weight 160 lb (72.576 kg)   Increase Aerobic Exercise and Physical Activity Yes;Sedentary   Intervention While in  program, learn and follow the exercise prescription taught. Start at a low level workload and increase workload after able to maintain previous level for 30 minutes. Increase time before increasing intensity.   Intervention Provide exercise education and an individualized exercise prescription that will provide continued progressive overload as per policy without signs/symptoms of physical distress.   Quit Smoking Yes   Number of packs per day 1 pack per week       Intervention Utilize your health care professional team to help with smoking cessation while in the program. Your doctor can prescribe medications to aid in cessation. The program can provide information and counseling as needed.   Take Less Medication Yes   Intervention Learn your risk factors and begin the lifestyle modifications for risk factor control during your time in the program.   Understand more about Heart/Pulmonary Disease. Yes   Intervention While in program utilize professionals for any questions, and attend the education sessions. Great websites to use are www.americanheart.org or www.lung.org for reliable information.   Improve shortness of breath with ADL's Yes   Intervention While in program, learn and follow the exercise prescription taught. Start at a low level workload and increase workload ad advised by the exercise physiologist. Increase time before increasing intensity.   Diabetes No   Hypertension Yes   Goal Participant will see blood pressure controlled within the values of 140/77m/Hg or within value directed by their physician.   Intervention Provide nutrition & aerobic exercise along with prescribed medications to achieve BP 140/90 or less.   Lipids Yes   Goal Cholesterol controlled with medications as prescribed, with individualized exercise RX and with personalized nutrition plan. Value goals: LDL < 733m HDL > 4060mParticipant states understanding of desired cholesterol values and following prescriptions.    Intervention Provide nutrition & aerobic exercise along with prescribed medications to achieve LDL <60m68mDL >40mg34mStress Yes   Goal To meet with psychosocial counselor for stress and relaxation information and guidance. To state understanding of performing relaxation techniques and or identifying personal stressors.      Personal Goals and Risk Factors Review:    Personal Goals Discharge:     Comments: 30 day review.  RobinShirlean Mylarcompleted orientation and will start classes soon.

## 2015-07-01 ENCOUNTER — Encounter: Payer: Managed Care, Other (non HMO) | Admitting: *Deleted

## 2015-07-01 DIAGNOSIS — Z952 Presence of prosthetic heart valve: Secondary | ICD-10-CM

## 2015-07-01 DIAGNOSIS — Z954 Presence of other heart-valve replacement: Secondary | ICD-10-CM | POA: Diagnosis not present

## 2015-07-01 NOTE — Progress Notes (Signed)
Daily Session Note  Patient Details  Name: Shelly Huynh MRN: [id number] Date of Birth: Mar 15, 1971 Referring Provider:  Corey Skains, MD  Encounter Date: 07/01/2015  Check In:     Session Check In - 07/01/15 1627    Check-In   Staff Present Diane Joya Gaskins RN, BSN;Taysom Glymph Dillard Essex MS, ACSM CEP Exercise Physiologist;Carroll Enterkin RN, BSN   ER physicians immediately available to respond to emergencies See telemetry face sheet for immediately available ER MD   Medication changes reported     No   Fall or balance concerns reported    No   Warm-up and Cool-down Performed on first and last piece of equipment   VAD Patient? No   VAD patient   Has back up controller? No   Pain Assessment   Currently in Pain? No/denies   Multiple Pain Sites No         Goals Met:  Independence with exercise equipment Exercise tolerated well Personal goals reviewed No report of cardiac concerns or symptoms Strength training completed today  Goals Unmet:  Not Applicable  Goals Comments: First day of exercise. Oriented [name] to equipment and reviewed her exercise prescription and exercise progression.    Dr. Emily Filbert is Medical Director for [place] and [facility].

## 2015-07-03 ENCOUNTER — Encounter: Payer: Self-pay | Admitting: *Deleted

## 2015-07-03 DIAGNOSIS — Z9889 Other specified postprocedural states: Secondary | ICD-10-CM

## 2015-07-03 DIAGNOSIS — Z952 Presence of prosthetic heart valve: Secondary | ICD-10-CM

## 2015-07-03 NOTE — Progress Notes (Signed)
Cardiac Individual Treatment Plan  Patient Details  Name: Shelly Huynh MRN: [id number] Date of Birth: July 29, 1971 Referring Provider:  Glennie Hawk, MD  Initial Encounter Date:    Visit Diagnosis: S/P aortic valve replacement  S/P mitral valve replacement  S/P tricuspid valve repair  Patient's Home Medications on Admission:  Current outpatient prescriptions:  .  albuterol (PROAIR HFA) 108 (90 BASE) MCG/ACT inhaler, Inhale into the lungs., Disp: , Rfl:  .  amoxicillin (AMOXIL) 500 MG capsule, Take 2,000 mg by mouth as needed (for dental procedure). , Disp: , Rfl:  .  aspirin 81 MG chewable tablet, Chew by mouth., Disp: , Rfl:  .  Biotin w/ Vitamins C & E 1250-7.5-7.5 MCG-MG-UNT CHEW, Chew 2 tablets by mouth daily., Disp: , Rfl:  .  buPROPion (WELLBUTRIN) 100 MG tablet, Take by mouth., Disp: , Rfl:  .  carvedilol (COREG) 3.125 MG tablet, Take by mouth., Disp: , Rfl:  .  cyclobenzaprine (FLEXERIL) 5 MG tablet, Take 5 mg by mouth at bedtime. , Disp: , Rfl:  .  diazepam (VALIUM) 5 MG tablet, Take by mouth., Disp: , Rfl:  .  furosemide (LASIX) 20 MG tablet, Take 20 mg by mouth daily. , Disp: , Rfl:  .  furosemide (LASIX) 40 MG tablet, Take by mouth., Disp: , Rfl:  .  gabapentin (NEURONTIN) 300 MG capsule, Take by mouth., Disp: , Rfl:  .  hydrALAZINE (APRESOLINE) 25 MG tablet, Take by mouth., Disp: , Rfl:  .  HYDROcodone-acetaminophen (NORCO/VICODIN) 5-325 MG per tablet, Take 1 tablet by mouth 2 (two) times daily as needed for moderate pain., Disp: , Rfl:  .  Linaclotide (LINZESS) 290 MCG CAPS capsule, Take by mouth., Disp: , Rfl:  .  meclizine (ANTIVERT) 25 MG tablet, Take by mouth., Disp: , Rfl:  .  meloxicam (MOBIC) 15 MG tablet, Take 15 mg by mouth daily as needed for pain., Disp: , Rfl:  .  omeprazole (PRILOSEC) 20 MG capsule, Take 20 mg by mouth daily. , Disp: , Rfl:  .  oxyCODONE-acetaminophen (ROXICET) 5-325 MG per tablet, Take 1 tablet by mouth every 6 (six) hours as  needed., Disp: 20 tablet, Rfl: 0 .  potassium chloride (MICRO-K) 10 MEQ CR capsule, Take by mouth., Disp: , Rfl:  .  pravastatin (PRAVACHOL) 20 MG tablet, Take 20 mg by mouth daily. , Disp: , Rfl:  .  verapamil (CALAN-SR) 120 MG CR tablet, Take by mouth., Disp: , Rfl:   Past Medical History: Past Medical History  Diagnosis Date  . Hypertension   . High cholesterol   . Anxiety   . IBS (irritable bowel syndrome)   . Acid reflux   . Sciatic pain   . Arthritis   . High cholesterol   . Hypokalemia     Tobacco Use: History  Smoking status  . Current Every Day Smoker  Smokeless tobacco  . Not on file    Labs: Recent Review Flowsheet Data    Labs for ITP Cardiac and Pulmonary Rehab Latest Ref Rng 03/04/2015   PHART 7.350 - 7.450 7.402   PCO2ART 35.0 - 45.0 mmHg 34.4(L)   HCO3 20.0 - 24.0 mEq/L 21.3   TCO2 0 - 100 mmol/L 22   ACIDBASEDEF 0.0 - 2.0 mmol/L 3.0(H)   O2SAT - 88.0       Exercise Target Goals:    Exercise Program Goal: Individual exercise prescription set with THRR, safety & activity barriers. Participant demonstrates ability to understand and report RPE using BORG scale,  to self-measure pulse accurately, and to acknowledge the importance of the exercise prescription.  Exercise Prescription Goal: Starting with aerobic activity 30 plus minutes a day, 3 days per week for initial exercise prescription. Provide home exercise prescription and guidelines that participant acknowledges understanding prior to discharge.  Activity Barriers & Risk Stratification:     Activity Barriers & Risk Stratification - 06/29/15 1322    Activity Barriers & Risk Stratification   Activity Barriers Arthritis;Back Problems;Neck/Spine Problems;Other (comment)   Comments Vertigo   Risk Stratification High      6 Minute Walk:     6 Minute Walk      06/29/15 1517       6 Minute Walk   Phase Initial     Distance 1350 feet     Walk Time 6 minutes     Resting HR 84 bpm      Resting BP 142/90 mmHg     Max Ex. HR 109 bpm     Max Ex. BP 160/86 mmHg     RPE 11        Initial Exercise Prescription:     Initial Exercise Prescription - 06/29/15 1500    Date of Initial Exercise Prescription   Date 06/29/15   Treadmill   MPH 2.3   Grade 0   Minutes 15   Bike   Level 1.2   Minutes 15   Recumbant Bike   Level 2   RPM 40   Watts 30   Minutes 15   NuStep   Level 3   Watts 30   Minutes 15   Arm Ergometer   Level 1   Watts 10   Minutes 15   Arm/Foot Ergometer   Level 1   Watts 15   Minutes 15   Cybex   Level 2   RPM 40   Minutes 15   Recumbant Elliptical   Level 1   RPM 30   Watts 15   Minutes 15   Elliptical   Level 1   Speed 3   Minutes 5   REL-XR   Level 3   Watts 30   Minutes 15   Prescription Details   Frequency (times per week) 3   Duration Progress to 30 minutes of continuous aerobic without signs/symptoms of physical distress   Intensity   THHR 40-80% of Max Heartrate 120-140   Ratings of Perceived Exertion 11-13   Progression Continue progressive overload as per policy without signs/symptoms or physical distress.   Resistance Training   Training Prescription Yes   Weight 2   Reps 10-12      Exercise Prescription Changes:   Discharge Exercise Prescription (Final Exercise Prescription Changes):   Nutrition:  Target Goals: Understanding of nutrition guidelines, daily intake of sodium <1564m, cholesterol <2066m calories 30% from fat and 7% or less from saturated fats, daily to have 5 or more servings of fruits and vegetables.  Biometrics:     Pre Biometrics - 06/29/15 1524    Pre Biometrics   Height 5' 3.25" (1.607 m)   Weight 222 lb 9.6 oz (100.971 kg)   Waist Circumference 43.25 inches   Hip Circumference 51 inches   Waist to Hip Ratio 0.85 %   BMI (Calculated) 39.2       Nutrition Therapy Plan and Nutrition Goals:   Nutrition Discharge: Rate Your Plate Scores:   Nutrition Goals  Re-Evaluation:   Psychosocial: Target Goals: Acknowledge presence or absence of depression, maximize coping skills, provide  positive support system. Participant is able to verbalize types and ability to use techniques and skills needed for reducing stress and depression.  Initial Review & Psychosocial Screening:     Initial Psych Review & Screening - 06/29/15 1333    Initial Review   Current issues with Current Depression;Current Anxiety/Panic;Current Psychotropic Meds;Current Stress Concerns   Source of Stress Concerns Occupation;Financial;Chronic Illness;Unable to perform yard/household activities;Unable to participate in former interests or hobbies   [place]? Yes   Comments Great support while in hospital and at home.     Barriers   Psychosocial barriers to participate in program There are no identifiable barriers or psychosocial needs.;The patient should benefit from training in stress management and relaxation.   Screening Interventions   Interventions Encouraged to exercise;Program counselor consult      Quality of Life Scores:     Quality of Life - 06/30/15 0820    Quality of Life Scores   Health/Function Pre 20 %   Socioeconomic Pre 13.64 %   Psych/Spiritual Pre 18 %   Family Pre 26.4 %   GLOBAL Pre 19.26 %      PHQ-9:     Recent Review Flowsheet Data    Depression screen [facility] 2/9 06/29/2015   Decreased Interest 1   Down, Depressed, Hopeless 1   PHQ - 2 Score 2   Altered sleeping 1   Tired, decreased energy 1   Change in appetite 1   Feeling bad or failure about yourself  1   Trouble concentrating 1   Moving slowly or fidgety/restless 1   Suicidal thoughts 0   PHQ-9 Score 8   Difficult doing work/chores Not difficult at all      Psychosocial Evaluation and Intervention:   Psychosocial Re-Evaluation:   Vocational Rehabilitation: Provide vocational rehab assistance to qualifying candidates.   Vocational Rehab Evaluation  & Intervention:     Vocational Rehab - 06/29/15 1324    Initial Vocational Rehab Evaluation & Intervention   Assessment shows need for Vocational Rehabilitation Yes   Documents faxed to [place] [facility] 06/29/15      Education: Education Goals: Education classes will be provided on a weekly basis, covering required topics. Participant will state understanding/return demonstration of topics presented.  Learning Barriers/Preferences:     Learning Barriers/Preferences - 06/29/15 1323    Learning Barriers/Preferences   Learning Barriers None   Learning Preferences Video      Education Topics: General Nutrition Guidelines/Fats and Fiber: -Group instruction provided by verbal, written material, models and posters to present the general guidelines for heart healthy nutrition. Gives an explanation and review of dietary fats and fiber.   Controlling Sodium/Reading Food Labels: -Group verbal and written material supporting the discussion of sodium use in heart healthy nutrition. Review and explanation with models, verbal and written materials for utilization of the food label.   Exercise Physiology & Risk Factors: - Group verbal and written instruction with models to review the exercise physiology of the cardiovascular system and associated critical values. Details cardiovascular disease risk factors and the goals associated with each risk factor.   Aerobic Exercise & Resistance Training: - Gives group verbal and written discussion on the health impact of inactivity. On the components of aerobic and resistive training programs and the benefits of this training and how to safely progress through these programs.   Flexibility, Balance, General Exercise Guidelines: - Provides group verbal and written instruction on the benefits of flexibility and  balance training programs. Provides general exercise guidelines with specific guidelines to those with heart or lung  disease. Demonstration and skill practice provided.   Stress Management: - Provides group verbal and written instruction about the health risks of elevated stress, cause of high stress, and healthy ways to reduce stress.   Depression: - Provides group verbal and written instruction on the correlation between heart/lung disease and depressed mood, treatment options, and the stigmas associated with seeking treatment.   Anatomy & Physiology of the Heart: - Group verbal and written instruction and models provide basic cardiac anatomy and physiology, with the coronary electrical and arterial systems. Review of: AMI, Angina, Valve disease, Heart Failure, Cardiac Arrhythmia, Pacemakers, and the ICD.   Cardiac Procedures: - Group verbal and written instruction and models to describe the testing methods done to diagnose heart disease. Reviews the outcomes of the test results. Describes the treatment choices: Medical Management, Angioplasty, or Coronary Bypass Surgery.   Cardiac Medications: - Group verbal and written instruction to review commonly prescribed medications for heart disease. Reviews the medication, class of the drug, and side effects. Includes the steps to properly store meds and maintain the prescription regimen.   Go Sex-Intimacy & Heart Disease, Get SMART - Goal Setting: - Group verbal and written instruction through game format to discuss heart disease and the return to sexual intimacy. Provides group verbal and written material to discuss and apply goal setting through the application of the S.M.A.R.T. Method.   Other Matters of the Heart: - Provides group verbal, written materials and models to describe Heart Failure, Angina, Valve Disease, and Diabetes in the realm of heart disease. Includes description of the disease process and treatment options available to the cardiac patient.          Cardiac Rehab from 07/01/2015 in [facility] Cardiac Rehab   Date  07/01/15   Educator  DW    Instruction Review Code  2- meets goals/outcomes      Exercise & Equipment Safety: - Individual verbal instruction and demonstration of equipment use and safety with use of the equipment.      Cardiac Rehab from 07/01/2015 in [facility] Cardiac Rehab   Date  06/29/15   Educator  SB   Instruction Review Code  2- meets goals/outcomes      Infection Prevention: - Provides verbal and written material to individual with discussion of infection control including proper hand washing and proper equipment cleaning during exercise session.      Cardiac Rehab from 07/01/2015 in [facility] Cardiac Rehab   Date  06/29/15   Educator  SB   Instruction Review Code  2- meets goals/outcomes      Falls Prevention: - Provides verbal and written material to individual with discussion of falls prevention and safety.      Cardiac Rehab from 07/01/2015 in [facility] Cardiac Rehab   Date  06/29/15   Educator  SB   Instruction Review Code  2- meets goals/outcomes      Diabetes: - Individual verbal and written instruction to review signs/symptoms of diabetes, desired ranges of glucose level fasting, after meals and with exercise. Advice that pre and post exercise glucose checks will be done for 3 sessions at entry of program.    Knowledge Questionnaire Score:     Knowledge Questionnaire Score - 06/30/15 0749    Knowledge Questionnaire Score   Pre Score 23/28      Personal Goals and Risk Factors at Admission:     Personal Goals and Risk  Factors at Admission - 06/29/15 1327    Personal Goals and Risk Factors on Admission    Weight Management Yes;Obesity   Intervention Learn and follow the exercise and diet guidelines while in the program. Utilize the nutrition and education classes to help gain knowledge of the diet and exercise expectations in the program   Intervention Provide weight management tools through evaluation completed by registered dietician and exercise physiologist.  Establish a goal weight with  participant.   Goal Weight 160 lb (72.576 kg)   Increase Aerobic Exercise and Physical Activity Yes;Sedentary   Intervention While in program, learn and follow the exercise prescription taught. Start at a low level workload and increase workload after able to maintain previous level for 30 minutes. Increase time before increasing intensity.   Intervention Provide exercise education and an individualized exercise prescription that will provide continued progressive overload as per policy without signs/symptoms of physical distress.   Quit Smoking Yes   Number of packs per day 1 pack per week       Intervention Utilize your health care professional team to help with smoking cessation while in the program. Your doctor can prescribe medications to aid in cessation. The program can provide information and counseling as needed.   Take Less Medication Yes   Intervention Learn your risk factors and begin the lifestyle modifications for risk factor control during your time in the program.   Understand more about Heart/Pulmonary Disease. Yes   Intervention While in program utilize professionals for any questions, and attend the education sessions. Great websites to use are www.americanheart.org or www.lung.org for reliable information.   Improve shortness of breath with ADL's Yes   Intervention While in program, learn and follow the exercise prescription taught. Start at a low level workload and increase workload ad advised by the exercise physiologist. Increase time before increasing intensity.   Diabetes No   Hypertension Yes   Goal Participant will see blood pressure controlled within the values of 140/77m/Hg or within value directed by their physician.   Intervention Provide nutrition & aerobic exercise along with prescribed medications to achieve BP 140/90 or less.   Lipids Yes   Goal Cholesterol controlled with medications as prescribed, with individualized exercise RX and with personalized nutrition  plan. Value goals: LDL < 755m HDL > 4084mParticipant states understanding of desired cholesterol values and following prescriptions.   Intervention Provide nutrition & aerobic exercise along with prescribed medications to achieve LDL <42m88mDL >40mg69mStress Yes   Goal To meet with psychosocial counselor for stress and relaxation information and guidance. To state understanding of performing relaxation techniques and or identifying personal stressors.      Personal Goals and Risk Factors Review:    Personal Goals Discharge:     Comments: 30 day review New to program,has attended 3 sessions.

## 2015-07-06 DIAGNOSIS — Z954 Presence of other heart-valve replacement: Secondary | ICD-10-CM | POA: Diagnosis not present

## 2015-07-06 DIAGNOSIS — Z952 Presence of prosthetic heart valve: Secondary | ICD-10-CM

## 2015-07-06 NOTE — Progress Notes (Signed)
Daily Session Note  Patient Details  Name: Shelly Huynh MRN: [id number] Date of Birth: [birth year] Referring Provider:  Corey Skains, MD  Encounter Date: 07/06/2015  Check In:     Session Check In - 07/06/15 1617    Check-In   Staff Present Lestine Box BS, ACSM EP-C, Exercise Physiologist;Carroll Enterkin RN, BSN;Mary Kellie Shropshire RN   ER physicians immediately available to respond to emergencies See telemetry face sheet for immediately available ER MD   Medication changes reported     No   Fall or balance concerns reported    No   Warm-up and Cool-down Performed on first and last piece of equipment   VAD Patient? No   VAD patient   Has back up controller? No   Pain Assessment   Currently in Pain? No/denies         Goals Met:  Proper associated with RPD/PD & O2 Sat Exercise tolerated well No report of cardiac concerns or symptoms Strength training completed today  Goals Unmet:  Not Applicable  Goals Comments:    Dr. Emily Filbert is Medical Director for [place] and [facility].

## 2015-07-08 ENCOUNTER — Other Ambulatory Visit: Payer: Self-pay | Admitting: *Deleted

## 2015-07-08 ENCOUNTER — Encounter: Payer: Managed Care, Other (non HMO) | Admitting: *Deleted

## 2015-07-08 DIAGNOSIS — Z954 Presence of other heart-valve replacement: Secondary | ICD-10-CM | POA: Diagnosis not present

## 2015-07-08 DIAGNOSIS — Z9889 Other specified postprocedural states: Secondary | ICD-10-CM

## 2015-07-08 DIAGNOSIS — Z952 Presence of prosthetic heart valve: Secondary | ICD-10-CM

## 2015-07-08 NOTE — Progress Notes (Signed)
Daily Session Note  Patient Details  Name: Shelly Huynh MRN: [id number] Date of Birth: 06-Jun-1971 Referring Provider:  Corey Skains, MD  Encounter Date: 07/08/2015  Check In:     Session Check In - 07/08/15 1653    Check-In   Staff Present Diane Joya Gaskins RN, BSN;Carroll Enterkin RN, BSN;Other  Hessie Knows, BS, Exercise Specialist   ER physicians immediately available to respond to emergencies See telemetry face sheet for immediately available ER MD   Medication changes reported     No   Fall or balance concerns reported    No   Warm-up and Cool-down Performed on first and last piece of equipment   VAD Patient? No   VAD patient   Has back up controller? No   Pain Assessment   Currently in Pain? No/denies         Goals Met:  Exercise tolerated well No report of cardiac concerns or symptoms Strength training completed today  Goals Unmet:  Not Applicable  Goals Comments:    Dr. Emily Filbert is Medical Director for [place] and [facility].

## 2015-07-09 DIAGNOSIS — Z954 Presence of other heart-valve replacement: Secondary | ICD-10-CM | POA: Diagnosis not present

## 2015-07-09 DIAGNOSIS — Z952 Presence of prosthetic heart valve: Secondary | ICD-10-CM

## 2015-07-09 NOTE — Progress Notes (Signed)
Daily Session Note  Patient Details  Name: Shelly Huynh MRN: [id number] Date of Birth: 09-Jul-1971 Referring Provider:  Corey Skains, MD  Encounter Date: 07/09/2015  Check In:     Session Check In - 07/09/15 1624    Check-In   Staff Present Gerlene Burdock RN, BSN;Nino Amano BS, ACSM EP-C, Exercise Physiologist;Diane Mariana Arn, BSN   ER physicians immediately available to respond to emergencies See telemetry face sheet for immediately available ER MD   Medication changes reported     No   Fall or balance concerns reported    No   Warm-up and Cool-down Performed on first and last piece of equipment   VAD Patient? No   VAD patient   Has back up controller? No   Pain Assessment   Currently in Pain? No/denies         Goals Met:  Proper associated with RPD/PD & O2 Sat Exercise tolerated well No report of cardiac concerns or symptoms Strength training completed today  Goals Unmet:  Not Applicable  Goals Comments:    Dr. Emily Filbert is Medical Director for [place] and [facility].

## 2015-07-13 DIAGNOSIS — Z952 Presence of prosthetic heart valve: Secondary | ICD-10-CM

## 2015-07-13 DIAGNOSIS — Z954 Presence of other heart-valve replacement: Secondary | ICD-10-CM | POA: Diagnosis not present

## 2015-07-13 NOTE — Progress Notes (Signed)
Daily Session Note  Patient Details  Name: Shelly Huynh MRN: [id number] Date of Birth: [birth year] Referring Provider:  Corey Skains, MD  Encounter Date: 07/13/2015  Check In:     Session Check In - 07/13/15 1624    Check-In   Staff Present Lestine Box BS, ACSM EP-C, Exercise Physiologist;Susanne Bice RN, BSN, CCRP;Carroll Enterkin RN, BSN   ER physicians immediately available to respond to emergencies See telemetry face sheet for immediately available ER MD   Medication changes reported     No   Fall or balance concerns reported    No   Warm-up and Cool-down Performed on first and last piece of equipment   VAD Patient? No   VAD patient   Has back up controller? No   Pain Assessment   Currently in Pain? No/denies         Goals Met:  Proper associated with RPD/PD & O2 Sat Exercise tolerated well No report of cardiac concerns or symptoms Strength training completed today  Goals Unmet:  Not Applicable  Goals Comments:    Dr. Emily Filbert is Medical Director for [place] and [facility].

## 2015-07-15 ENCOUNTER — Telehealth: Payer: Self-pay | Admitting: *Deleted

## 2015-07-15 NOTE — Telephone Encounter (Signed)
Shelly Huynh, RT took a msg that "Shelly Huynh" Shelly Huynh won't be able to attend Cardiac Rehab this week.

## 2015-07-22 ENCOUNTER — Encounter: Payer: Managed Care, Other (non HMO) | Admitting: *Deleted

## 2015-07-22 DIAGNOSIS — Z952 Presence of prosthetic heart valve: Secondary | ICD-10-CM

## 2015-07-22 DIAGNOSIS — Z954 Presence of other heart-valve replacement: Secondary | ICD-10-CM | POA: Diagnosis not present

## 2015-07-22 NOTE — Progress Notes (Signed)
Daily Session Note  Patient Details  Name: Shelly Huynh MRN: [id number] Date of Birth: 15-Nov-1971 Referring Provider:  Corey Skains, MD  Encounter Date: 07/22/2015  Check In:     Session Check In - 07/22/15 1609    Check-In   Staff Present Diane Joya Gaskins RN, BSN;Carroll Enterkin RN, Drusilla Kanner MS, ACSM CEP Exercise Physiologist   ER physicians immediately available to respond to emergencies See telemetry face sheet for immediately available ER MD   Medication changes reported     No   Fall or balance concerns reported    No   Warm-up and Cool-down Performed on first and last piece of equipment   VAD Patient? No   VAD patient   Has back up controller? No   Pain Assessment   Currently in Pain? No/denies   Multiple Pain Sites No         Goals Met:  Independence with exercise equipment Exercise tolerated well No report of cardiac concerns or symptoms Strength training completed today  Goals Unmet:  Not Applicable  Goals Comments:    Dr. Emily Filbert is Medical Director for [place] and [facility].

## 2015-07-23 ENCOUNTER — Encounter: Payer: Managed Care, Other (non HMO) | Attending: Internal Medicine | Admitting: *Deleted

## 2015-07-23 DIAGNOSIS — Z954 Presence of other heart-valve replacement: Secondary | ICD-10-CM | POA: Insufficient documentation

## 2015-07-23 DIAGNOSIS — Z9889 Other specified postprocedural states: Secondary | ICD-10-CM | POA: Insufficient documentation

## 2015-07-23 DIAGNOSIS — Z952 Presence of prosthetic heart valve: Secondary | ICD-10-CM

## 2015-07-28 ENCOUNTER — Other Ambulatory Visit: Payer: Self-pay | Admitting: Orthopedic Surgery

## 2015-07-28 DIAGNOSIS — M5416 Radiculopathy, lumbar region: Secondary | ICD-10-CM

## 2015-07-28 DIAGNOSIS — M4306 Spondylolysis, lumbar region: Secondary | ICD-10-CM

## 2015-07-30 DIAGNOSIS — Z952 Presence of prosthetic heart valve: Secondary | ICD-10-CM

## 2015-07-30 DIAGNOSIS — Z954 Presence of other heart-valve replacement: Secondary | ICD-10-CM | POA: Diagnosis not present

## 2015-07-30 NOTE — Progress Notes (Signed)
Daily Session Note  Patient Details  Name: Shelly Huynh MRN: [id number] Date of Birth: December 05, 1970 Referring Provider:  Corey Skains, MD  Encounter Date: 07/30/2015  Check In:     Session Check In - 07/30/15 1630    Check-In   Staff Present Nyoka Cowden RN;Bena Kobel BS, ACSM EP-C, Exercise Physiologist;Diane Mariana Arn, BSN   ER physicians immediately available to respond to emergencies See telemetry face sheet for immediately available ER MD   Medication changes reported     No   Fall or balance concerns reported    No   Warm-up and Cool-down Performed on first and last piece of equipment   VAD Patient? No   Pain Assessment   Currently in Pain? No/denies         Goals Met:  Proper associated with RPD/PD & O2 Sat Exercise tolerated well No report of cardiac concerns or symptoms Strength training completed today  Goals Unmet:  Not Applicable  Goals Comments:    Dr. Emily Filbert is Medical Director for [place] and [facility].

## 2015-08-03 ENCOUNTER — Encounter: Payer: Self-pay | Admitting: *Deleted

## 2015-08-04 ENCOUNTER — Ambulatory Visit: Payer: Managed Care, Other (non HMO)

## 2015-08-05 ENCOUNTER — Encounter: Payer: Managed Care, Other (non HMO) | Admitting: *Deleted

## 2015-08-05 ENCOUNTER — Encounter: Payer: Self-pay | Admitting: *Deleted

## 2015-08-05 DIAGNOSIS — Z952 Presence of prosthetic heart valve: Secondary | ICD-10-CM

## 2015-08-05 DIAGNOSIS — Z954 Presence of other heart-valve replacement: Secondary | ICD-10-CM | POA: Diagnosis not present

## 2015-08-05 DIAGNOSIS — Z9889 Other specified postprocedural states: Secondary | ICD-10-CM

## 2015-08-05 NOTE — Progress Notes (Signed)
Cardiac Individual Treatment Plan  Patient Details  Name: Shelly Huynh MRN: [id number] Date of Birth: [birth year] Referring Provider:  Corey Skains, MD  Initial Encounter Date:    Visit Diagnosis: S/P aortic valve replacement  S/P mitral valve replacement  S/P tricuspid valve repair  Patient's Home Medications on Admission:  Current outpatient prescriptions:  .  albuterol (PROAIR HFA) 108 (90 BASE) MCG/ACT inhaler, Inhale into the lungs., Disp: , Rfl:  .  amoxicillin (AMOXIL) 500 MG capsule, Take 2,000 mg by mouth as needed (for dental procedure). , Disp: , Rfl:  .  aspirin 81 MG chewable tablet, Chew by mouth., Disp: , Rfl:  .  Biotin w/ Vitamins C & E 1250-7.5-7.5 MCG-MG-UNT CHEW, Chew 2 tablets by mouth daily., Disp: , Rfl:  .  buPROPion (WELLBUTRIN) 100 MG tablet, Take by mouth., Disp: , Rfl:  .  carvedilol (COREG) 3.125 MG tablet, Take by mouth., Disp: , Rfl:  .  cyclobenzaprine (FLEXERIL) 5 MG tablet, Take 5 mg by mouth at bedtime. , Disp: , Rfl:  .  diazepam (VALIUM) 5 MG tablet, Take by mouth., Disp: , Rfl:  .  fluticasone (FLONASE) 50 MCG/ACT nasal spray, Place into the nose., Disp: , Rfl:  .  furosemide (LASIX) 20 MG tablet, Take 20 mg by mouth daily. , Disp: , Rfl:  .  furosemide (LASIX) 40 MG tablet, Take by mouth., Disp: , Rfl:  .  furosemide (LASIX) 40 MG tablet, Take by mouth., Disp: , Rfl:  .  gabapentin (NEURONTIN) 300 MG capsule, Take by mouth., Disp: , Rfl:  .  gabapentin (NEURONTIN) 300 MG capsule, Take by mouth., Disp: , Rfl:  .  hydrALAZINE (APRESOLINE) 25 MG tablet, Take by mouth., Disp: , Rfl:  .  HYDROcodone-acetaminophen (NORCO/VICODIN) 5-325 MG per tablet, Take 1 tablet by mouth 2 (two) times daily as needed for moderate pain., Disp: , Rfl:  .  Linaclotide (LINZESS) 290 MCG CAPS capsule, Take by mouth., Disp: , Rfl:  .  loratadine (CLARITIN) 10 MG tablet, Take by mouth., Disp: , Rfl:  .  meclizine (ANTIVERT) 25 MG tablet, Take by mouth., Disp: ,  Rfl:  .  meloxicam (MOBIC) 15 MG tablet, Take 15 mg by mouth daily as needed for pain., Disp: , Rfl:  .  omeprazole (PRILOSEC) 20 MG capsule, Take 20 mg by mouth daily. , Disp: , Rfl:  .  oxyCODONE-acetaminophen (ROXICET) 5-325 MG per tablet, Take 1 tablet by mouth every 6 (six) hours as needed., Disp: 20 tablet, Rfl: 0 .  potassium chloride (MICRO-K) 10 MEQ CR capsule, Take by mouth., Disp: , Rfl:  .  potassium chloride (MICRO-K) 10 MEQ CR capsule, Take by mouth., Disp: , Rfl:  .  pravastatin (PRAVACHOL) 20 MG tablet, Take 20 mg by mouth daily. , Disp: , Rfl:  .  verapamil (CALAN-SR) 120 MG CR tablet, Take by mouth., Disp: , Rfl:  .  warfarin (COUMADIN) 7.5 MG tablet, Take by mouth., Disp: , Rfl:   Past Medical History: Past Medical History  Diagnosis Date  . Hypertension   . High cholesterol   . Anxiety   . IBS (irritable bowel syndrome)   . Acid reflux   . Sciatic pain   . Arthritis   . High cholesterol   . Hypokalemia     Tobacco Use: History  Smoking status  . Current Every Day Smoker  Smokeless tobacco  . Not on file    Labs: Recent Review Flowsheet Data    Labs for ITP  Cardiac and Pulmonary Rehab Latest Ref Rng 03/04/2015   PHART 7.350 - 7.450 7.402   PCO2ART 35.0 - 45.0 mmHg 34.4(L)   HCO3 20.0 - 24.0 mEq/L 21.3   TCO2 0 - 100 mmol/L 22   ACIDBASEDEF 0.0 - 2.0 mmol/L 3.0(H)   O2SAT - 88.0       Exercise Target Goals:    Exercise Program Goal: Individual exercise prescription set with THRR, safety & activity barriers. Participant demonstrates ability to understand and report RPE using BORG scale, to self-measure pulse accurately, and to acknowledge the importance of the exercise prescription.  Exercise Prescription Goal: Starting with aerobic activity 30 plus minutes a day, 3 days per week for initial exercise prescription. Provide home exercise prescription and guidelines that participant acknowledges understanding prior to discharge.  Activity Barriers &  Risk Stratification:     Activity Barriers & Risk Stratification - 06/29/15 1322    Activity Barriers & Risk Stratification   Activity Barriers Arthritis;Back Problems;Neck/Spine Problems;Other (comment)   Comments Vertigo   Risk Stratification High      6 Minute Walk:     6 Minute Walk      06/29/15 1517       6 Minute Walk   Phase Initial     Distance 1350 feet     Walk Time 6 minutes     Resting HR 84 bpm     Resting BP 142/90 mmHg     Max Ex. HR 109 bpm     Max Ex. BP 160/86 mmHg     RPE 11        Initial Exercise Prescription:     Initial Exercise Prescription - 06/29/15 1500    Date of Initial Exercise Prescription   Date 06/29/15   Treadmill   MPH 2.3   Grade 0   Minutes 15   Bike   Level 1.2   Minutes 15   Recumbant Bike   Level 2   RPM 40   Watts 30   Minutes 15   NuStep   Level 3   Watts 30   Minutes 15   Arm Ergometer   Level 1   Watts 10   Minutes 15   Arm/Foot Ergometer   Level 1   Watts 15   Minutes 15   Cybex   Level 2   RPM 40   Minutes 15   Recumbant Elliptical   Level 1   RPM 30   Watts 15   Minutes 15   Elliptical   Level 1   Speed 3   Minutes 5   REL-XR   Level 3   Watts 30   Minutes 15   Prescription Details   Frequency (times per week) 3   Duration Progress to 30 minutes of continuous aerobic without signs/symptoms of physical distress   Intensity   THHR 40-80% of Max Heartrate 120-140   Ratings of Perceived Exertion 11-13   Progression Continue progressive overload as per policy without signs/symptoms or physical distress.   Resistance Training   Training Prescription Yes   Weight 2   Reps 10-12      Exercise Prescription Changes:     Exercise Prescription Changes      08/03/15 0600           Exercise Review   Progression Yes       Response to Exercise   Blood Pressure (Admit) 120/66 mmHg       Blood Pressure (Exercise) 150/70 mmHg  Blood Pressure (Exit) 142/90 mmHg       Heart Rate  (Admit) 87 bpm       Heart Rate (Exercise) 95 bpm       Heart Rate (Exit) 89 bpm       Rating of Perceived Exertion (Exercise) 15       Symptoms No       Duration Progress to 30 minutes of continuous aerobic without signs/symptoms of physical distress       Intensity Other (comment)  120-140 (40-80% HR max)       Progression Continue progressive overload as per policy without signs/symptoms or physical distress.       Resistance Training   Training Prescription Yes       Weight 3       Reps 10-15       Interval Training   Interval Training Yes       Equipment REL-XR;NuStep       NuStep   Level 4       Watts 35       Minutes 15       REL-XR   Level 5       Watts 75       Minutes 15          Discharge Exercise Prescription (Final Exercise Prescription Changes):     Exercise Prescription Changes - 08/03/15 0600    Exercise Review   Progression Yes   Response to Exercise   Blood Pressure (Admit) 120/66 mmHg   Blood Pressure (Exercise) 150/70 mmHg   Blood Pressure (Exit) 142/90 mmHg   Heart Rate (Admit) 87 bpm   Heart Rate (Exercise) 95 bpm   Heart Rate (Exit) 89 bpm   Rating of Perceived Exertion (Exercise) 15   Symptoms No   Duration Progress to 30 minutes of continuous aerobic without signs/symptoms of physical distress   Intensity Other (comment)  120-140 (40-80% HR max)   Progression Continue progressive overload as per policy without signs/symptoms or physical distress.   Resistance Training   Training Prescription Yes   Weight 3   Reps 10-15   Interval Training   Interval Training Yes   Equipment REL-XR;NuStep   NuStep   Level 4   Watts 35   Minutes 15   REL-XR   Level 5   Watts 75   Minutes 15      Nutrition:  Target Goals: Understanding of nutrition guidelines, daily intake of sodium <1523m, cholesterol <2050m calories 30% from fat and 7% or less from saturated fats, daily to have 5 or more servings of fruits and vegetables.  Biometrics:      Pre Biometrics - 06/29/15 1524    Pre Biometrics   Height 5' 3.25" (1.607 m)   Weight 222 lb 9.6 oz (100.971 kg)   Waist Circumference 43.25 inches   Hip Circumference 51 inches   Waist to Hip Ratio 0.85 %   BMI (Calculated) 39.2       Nutrition Therapy Plan and Nutrition Goals:   Nutrition Discharge: Rate Your Plate Scores:   Nutrition Goals Re-Evaluation:   Psychosocial: Target Goals: Acknowledge presence or absence of depression, maximize coping skills, provide positive support system. Participant is able to verbalize types and ability to use techniques and skills needed for reducing stress and depression.  Initial Review & Psychosocial Screening:     Initial Psych Review & Screening - 06/29/15 1333    Initial Review   Current issues with Current Depression;Current Anxiety/Panic;Current  Psychotropic Meds;Current Stress Concerns   Source of Stress Concerns Occupation;Financial;Chronic Illness;Unable to perform yard/household activities;Unable to participate in former interests or hobbies   [place]? Yes   Comments Great support while in hospital and at home.     Barriers   Psychosocial barriers to participate in program There are no identifiable barriers or psychosocial needs.;The patient should benefit from training in stress management and relaxation.   Screening Interventions   Interventions Encouraged to exercise;Program counselor consult      Quality of Life Scores:     Quality of Life - 06/30/15 0820    Quality of Life Scores   Health/Function Pre 20 %   Socioeconomic Pre 13.64 %   Psych/Spiritual Pre 18 %   Family Pre 26.4 %   GLOBAL Pre 19.26 %      PHQ-9:     Recent Review Flowsheet Data    Depression screen [facility] 2/9 06/29/2015   Decreased Interest 1   Down, Depressed, Hopeless 1   PHQ - 2 Score 2   Altered sleeping 1   Tired, decreased energy 1   Change in appetite 1   Feeling bad or failure about yourself  1    Trouble concentrating 1   Moving slowly or fidgety/restless 1   Suicidal thoughts 0   PHQ-9 Score 8   Difficult doing work/chores Not difficult at all      Psychosocial Evaluation and Intervention:     Psychosocial Evaluation - 07/06/15 1707    Psychosocial Evaluation & Interventions   Interventions Stress management education;Relaxation education;Encouraged to exercise with the program and follow exercise prescription   Comments Counselor met with Ms. Vanepps today for initial psychosocial evaluation.  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  She has a strong support system with a spouse of 11 years and an Aunt who lives close by.  Ms. Greenhouse has other health issues complicating her recovery with GERD, IBS, Sleep Apnea and non-alcoholic fatty liver disease. She reports sleeping okay currently although the CPAP mask is a problem for her.  She also states her appetite is "too good" with a goal to lose some weight while in this program.  Ms. Shanks reports a history of depression and anxiety and is currently on medication to treat this.  She continues to experience anxiety symptoms and was encouraged to speak with the doctor about adding a previous medication back that worked better for these symptoms.  Ms. Sharps admits that although she is typically a positive person, she has stressors such as financial and her health that impact her mood.  She desires to learn more about healthier eating/preparation of foods in this program, to increase her stamina and strength and to lose some weight.  She plans to either work out at home on her treadmill or look into the follow up programs here at [facility] to see if they fit with her schedule once she completes this program.     Continued Psychosocial Services Needed Yes  Ms. Grahn will benefit from the psychoeducational components of this program as well as meeting with the dietician for her weight loss goals.  She also is encouraged to follow-up  with her Dr. in regards to her anxiety symptoms and possible Rx.      Psychosocial Re-Evaluation:   Vocational Rehabilitation: Provide vocational rehab assistance to qualifying candidates.   Vocational Rehab Evaluation & Intervention:     Vocational Rehab - 06/29/15 1324  Initial Vocational Rehab Evaluation & Intervention   Assessment shows need for Vocational Rehabilitation Yes   Documents faxed to [place] [facility] 06/29/15      Education: Education Goals: Education classes will be provided on a weekly basis, covering required topics. Participant will state understanding/return demonstration of topics presented.  Learning Barriers/Preferences:     Learning Barriers/Preferences - 06/29/15 1323    Learning Barriers/Preferences   Learning Barriers None   Learning Preferences Video      Education Topics: General Nutrition Guidelines/Fats and Fiber: -Group instruction provided by verbal, written material, models and posters to present the general guidelines for heart healthy nutrition. Gives an explanation and review of dietary fats and fiber.          Cardiac Rehab from 07/22/2015 in [facility] Cardiac Rehab   Date  07/06/15   Educator  PI   Instruction Review Code  2- meets goals/outcomes      Controlling Sodium/Reading Food Labels: -Group verbal and written material supporting the discussion of sodium use in heart healthy nutrition. Review and explanation with models, verbal and written materials for utilization of the food label.      Cardiac Rehab from 07/22/2015 in [facility] Cardiac Rehab   Date  07/13/15   Educator  PI   Instruction Review Code  2- meets goals/outcomes      Exercise Physiology & Risk Factors: - Group verbal and written instruction with models to review the exercise physiology of the cardiovascular system and associated critical values. Details cardiovascular disease risk factors and the goals associated with each risk  factor.   Aerobic Exercise & Resistance Training: - Gives group verbal and written discussion on the health impact of inactivity. On the components of aerobic and resistive training programs and the benefits of this training and how to safely progress through these programs.   Flexibility, Balance, General Exercise Guidelines: - Provides group verbal and written instruction on the benefits of flexibility and balance training programs. Provides general exercise guidelines with specific guidelines to those with heart or lung disease. Demonstration and skill practice provided.   Stress Management: - Provides group verbal and written instruction about the health risks of elevated stress, cause of high stress, and healthy ways to reduce stress.   Depression: - Provides group verbal and written instruction on the correlation between heart/lung disease and depressed mood, treatment options, and the stigmas associated with seeking treatment.   Anatomy & Physiology of the Heart: - Group verbal and written instruction and models provide basic cardiac anatomy and physiology, with the coronary electrical and arterial systems. Review of: AMI, Angina, Valve disease, Heart Failure, Cardiac Arrhythmia, Pacemakers, and the ICD.   Cardiac Procedures: - Group verbal and written instruction and models to describe the testing methods done to diagnose heart disease. Reviews the outcomes of the test results. Describes the treatment choices: Medical Management, Angioplasty, or Coronary Bypass Surgery.   Cardiac Medications: - Group verbal and written instruction to review commonly prescribed medications for heart disease. Reviews the medication, class of the drug, and side effects. Includes the steps to properly store meds and maintain the prescription regimen.      Cardiac Rehab from 07/22/2015 in [facility] Cardiac Rehab   Date  07/22/15   Educator  DW   Instruction Review Code  2- meets goals/outcomes       Go Sex-Intimacy & Heart Disease, Get SMART - Goal Setting: - Group verbal and written instruction through game format to discuss heart disease and  the return to sexual intimacy. Provides group verbal and written material to discuss and apply goal setting through the application of the S.M.A.R.T. Method.   Other Matters of the Heart: - Provides group verbal, written materials and models to describe Heart Failure, Angina, Valve Disease, and Diabetes in the realm of heart disease. Includes description of the disease process and treatment options available to the cardiac patient.      Cardiac Rehab from 07/22/2015 in [facility] Cardiac Rehab   Date  07/01/15   Educator  DW   Instruction Review Code  2- meets goals/outcomes      Exercise & Equipment Safety: - Individual verbal instruction and demonstration of equipment use and safety with use of the equipment.      Cardiac Rehab from 07/22/2015 in [facility] Cardiac Rehab   Date  06/29/15   Educator  SB   Instruction Review Code  2- meets goals/outcomes      Infection Prevention: - Provides verbal and written material to individual with discussion of infection control including proper hand washing and proper equipment cleaning during exercise session.      Cardiac Rehab from 07/22/2015 in [facility] Cardiac Rehab   Date  06/29/15   Educator  SB   Instruction Review Code  2- meets goals/outcomes      Falls Prevention: - Provides verbal and written material to individual with discussion of falls prevention and safety.      Cardiac Rehab from 07/22/2015 in [facility] Cardiac Rehab   Date  06/29/15   Educator  SB   Instruction Review Code  2- meets goals/outcomes      Diabetes: - Individual verbal and written instruction to review signs/symptoms of diabetes, desired ranges of glucose level fasting, after meals and with exercise. Advice that pre and post exercise glucose checks will be done for 3 sessions at entry of program.    Knowledge Questionnaire  Score:     Knowledge Questionnaire Score - 06/30/15 0749    Knowledge Questionnaire Score   Pre Score 23/28      Personal Goals and Risk Factors at Admission:     Personal Goals and Risk Factors at Admission - 06/29/15 1327    Personal Goals and Risk Factors on Admission    Weight Management Yes;Obesity   Intervention Learn and follow the exercise and diet guidelines while in the program. Utilize the nutrition and education classes to help gain knowledge of the diet and exercise expectations in the program   Intervention Provide weight management tools through evaluation completed by registered dietician and exercise physiologist.  Establish a goal weight with participant.   Goal Weight 160 lb (72.576 kg)   Increase Aerobic Exercise and Physical Activity Yes;Sedentary   Intervention While in program, learn and follow the exercise prescription taught. Start at a low level workload and increase workload after able to maintain previous level for 30 minutes. Increase time before increasing intensity.   Intervention Provide exercise education and an individualized exercise prescription that will provide continued progressive overload as per policy without signs/symptoms of physical distress.   Quit Smoking Yes   Number of packs per day 1 pack per week       Intervention Utilize your health care professional team to help with smoking cessation while in the program. Your doctor can prescribe medications to aid in cessation. The program can provide information and counseling as needed.   Take Less Medication Yes   Intervention Learn your risk factors and begin the lifestyle modifications for  risk factor control during your time in the program.   Understand more about Heart/Pulmonary Disease. Yes   Intervention While in program utilize professionals for any questions, and attend the education sessions. Great websites to use are www.americanheart.org or www.lung.org for reliable information.    Improve shortness of breath with ADL's Yes   Intervention While in program, learn and follow the exercise prescription taught. Start at a low level workload and increase workload ad advised by the exercise physiologist. Increase time before increasing intensity.   Diabetes No   Hypertension Yes   Goal Participant will see blood pressure controlled within the values of 140/19m/Hg or within value directed by their physician.   Intervention Provide nutrition & aerobic exercise along with prescribed medications to achieve BP 140/90 or less.   Lipids Yes   Goal Cholesterol controlled with medications as prescribed, with individualized exercise RX and with personalized nutrition plan. Value goals: LDL < 723m HDL > 4069mParticipant states understanding of desired cholesterol values and following prescriptions.   Intervention Provide nutrition & aerobic exercise along with prescribed medications to achieve LDL <66m43mDL >40mg52mStress Yes   Goal To meet with psychosocial counselor for stress and relaxation information and guidance. To state understanding of performing relaxation techniques and or identifying personal stressors.      Personal Goals and Risk Factors Review:    Personal Goals Discharge:     Comments: 30 day review Continue with ITP

## 2015-08-05 NOTE — Progress Notes (Signed)
Cardiac Individual Treatment Plan  Patient Details  Name: Shelly Huynh MRN: [id number] Date of Birth: 1971-05-27 Referring Provider:  Corey Skains, MD  Initial Encounter Date:    Visit Diagnosis: S/P mitral valve replacement  Patient's Home Medications on Admission:  Current outpatient prescriptions:  .  fluticasone (FLONASE) 50 MCG/ACT nasal spray, Place into the nose., Disp: , Rfl:  .  furosemide (LASIX) 40 MG tablet, Take by mouth., Disp: , Rfl:  .  gabapentin (NEURONTIN) 300 MG capsule, Take by mouth., Disp: , Rfl:  .  loratadine (CLARITIN) 10 MG tablet, Take by mouth., Disp: , Rfl:  .  potassium chloride (MICRO-K) 10 MEQ CR capsule, Take by mouth., Disp: , Rfl:  .  warfarin (COUMADIN) 7.5 MG tablet, Take by mouth., Disp: , Rfl:  .  albuterol (PROAIR HFA) 108 (90 BASE) MCG/ACT inhaler, Inhale into the lungs., Disp: , Rfl:  .  amoxicillin (AMOXIL) 500 MG capsule, Take 2,000 mg by mouth as needed (for dental procedure). , Disp: , Rfl:  .  aspirin 81 MG chewable tablet, Chew by mouth., Disp: , Rfl:  .  Biotin w/ Vitamins C & E 1250-7.5-7.5 MCG-MG-UNT CHEW, Chew 2 tablets by mouth daily., Disp: , Rfl:  .  buPROPion (WELLBUTRIN) 100 MG tablet, Take by mouth., Disp: , Rfl:  .  carvedilol (COREG) 3.125 MG tablet, Take by mouth., Disp: , Rfl:  .  cyclobenzaprine (FLEXERIL) 5 MG tablet, Take 5 mg by mouth at bedtime. , Disp: , Rfl:  .  diazepam (VALIUM) 5 MG tablet, Take by mouth., Disp: , Rfl:  .  furosemide (LASIX) 20 MG tablet, Take 20 mg by mouth daily. , Disp: , Rfl:  .  furosemide (LASIX) 40 MG tablet, Take by mouth., Disp: , Rfl:  .  gabapentin (NEURONTIN) 300 MG capsule, Take by mouth., Disp: , Rfl:  .  hydrALAZINE (APRESOLINE) 25 MG tablet, Take by mouth., Disp: , Rfl:  .  HYDROcodone-acetaminophen (NORCO/VICODIN) 5-325 MG per tablet, Take 1 tablet by mouth 2 (two) times daily as needed for moderate pain., Disp: , Rfl:  .  Linaclotide (LINZESS) 290 MCG CAPS capsule, Take  by mouth., Disp: , Rfl:  .  meclizine (ANTIVERT) 25 MG tablet, Take by mouth., Disp: , Rfl:  .  meloxicam (MOBIC) 15 MG tablet, Take 15 mg by mouth daily as needed for pain., Disp: , Rfl:  .  omeprazole (PRILOSEC) 20 MG capsule, Take 20 mg by mouth daily. , Disp: , Rfl:  .  oxyCODONE-acetaminophen (ROXICET) 5-325 MG per tablet, Take 1 tablet by mouth every 6 (six) hours as needed., Disp: 20 tablet, Rfl: 0 .  potassium chloride (MICRO-K) 10 MEQ CR capsule, Take by mouth., Disp: , Rfl:  .  pravastatin (PRAVACHOL) 20 MG tablet, Take 20 mg by mouth daily. , Disp: , Rfl:  .  verapamil (CALAN-SR) 120 MG CR tablet, Take by mouth., Disp: , Rfl:   Past Medical History: Past Medical History  Diagnosis Date  . Hypertension   . High cholesterol   . Anxiety   . IBS (irritable bowel syndrome)   . Acid reflux   . Sciatic pain   . Arthritis   . High cholesterol   . Hypokalemia     Tobacco Use: History  Smoking status  . Current Every Day Smoker  Smokeless tobacco  . Not on file    Labs: Recent Review Flowsheet Data    Labs for ITP Cardiac and Pulmonary Rehab Latest Ref Rng 03/04/2015  PHART 7.350 - 7.450 7.402   PCO2ART 35.0 - 45.0 mmHg 34.4(L)   HCO3 20.0 - 24.0 mEq/L 21.3   TCO2 0 - 100 mmol/L 22   ACIDBASEDEF 0.0 - 2.0 mmol/L 3.0(H)   O2SAT - 88.0       Exercise Target Goals:    Exercise Program Goal: Individual exercise prescription set with THRR, safety & activity barriers. Participant demonstrates ability to understand and report RPE using BORG scale, to self-measure pulse accurately, and to acknowledge the importance of the exercise prescription.  Exercise Prescription Goal: Starting with aerobic activity 30 plus minutes a day, 3 days per week for initial exercise prescription. Provide home exercise prescription and guidelines that participant acknowledges understanding prior to discharge.  Activity Barriers & Risk Stratification:     Activity Barriers & Risk  Stratification - 06/29/15 1322    Activity Barriers & Risk Stratification   Activity Barriers Arthritis;Back Problems;Neck/Spine Problems;Other (comment)   Comments Vertigo   Risk Stratification High      6 Minute Walk:     6 Minute Walk      06/29/15 1517       6 Minute Walk   Phase Initial     Distance 1350 feet     Walk Time 6 minutes     Resting HR 84 bpm     Resting BP 142/90 mmHg     Max Ex. HR 109 bpm     Max Ex. BP 160/86 mmHg     RPE 11        Initial Exercise Prescription:     Initial Exercise Prescription - 06/29/15 1500    Date of Initial Exercise Prescription   Date 06/29/15   Treadmill   MPH 2.3   Grade 0   Minutes 15   Bike   Level 1.2   Minutes 15   Recumbant Bike   Level 2   RPM 40   Watts 30   Minutes 15   NuStep   Level 3   Watts 30   Minutes 15   Arm Ergometer   Level 1   Watts 10   Minutes 15   Arm/Foot Ergometer   Level 1   Watts 15   Minutes 15   Cybex   Level 2   RPM 40   Minutes 15   Recumbant Elliptical   Level 1   RPM 30   Watts 15   Minutes 15   Elliptical   Level 1   Speed 3   Minutes 5   REL-XR   Level 3   Watts 30   Minutes 15   Prescription Details   Frequency (times per week) 3   Duration Progress to 30 minutes of continuous aerobic without signs/symptoms of physical distress   Intensity   THHR 40-80% of Max Heartrate 120-140   Ratings of Perceived Exertion 11-13   Progression Continue progressive overload as per policy without signs/symptoms or physical distress.   Resistance Training   Training Prescription Yes   Weight 2   Reps 10-12      Exercise Prescription Changes:     Exercise Prescription Changes      08/03/15 0600           Exercise Review   Progression Yes       Response to Exercise   Blood Pressure (Admit) 120/66 mmHg       Blood Pressure (Exercise) 150/70 mmHg       Blood Pressure (Exit) 142/90 mmHg  Heart Rate (Admit) 87 bpm       Heart Rate (Exercise) 95 bpm        Heart Rate (Exit) 89 bpm       Rating of Perceived Exertion (Exercise) 15       Symptoms No       Duration Progress to 30 minutes of continuous aerobic without signs/symptoms of physical distress       Intensity Other (comment)  120-140 (40-80% HR max)       Progression Continue progressive overload as per policy without signs/symptoms or physical distress.       Resistance Training   Training Prescription Yes       Weight 3       Reps 10-15       Interval Training   Interval Training Yes       Equipment REL-XR;NuStep       NuStep   Level 4       Watts 35       Minutes 15       REL-XR   Level 5       Watts 75       Minutes 15          Discharge Exercise Prescription (Final Exercise Prescription Changes):     Exercise Prescription Changes - 08/03/15 0600    Exercise Review   Progression Yes   Response to Exercise   Blood Pressure (Admit) 120/66 mmHg   Blood Pressure (Exercise) 150/70 mmHg   Blood Pressure (Exit) 142/90 mmHg   Heart Rate (Admit) 87 bpm   Heart Rate (Exercise) 95 bpm   Heart Rate (Exit) 89 bpm   Rating of Perceived Exertion (Exercise) 15   Symptoms No   Duration Progress to 30 minutes of continuous aerobic without signs/symptoms of physical distress   Intensity Other (comment)  120-140 (40-80% HR max)   Progression Continue progressive overload as per policy without signs/symptoms or physical distress.   Resistance Training   Training Prescription Yes   Weight 3   Reps 10-15   Interval Training   Interval Training Yes   Equipment REL-XR;NuStep   NuStep   Level 4   Watts 35   Minutes 15   REL-XR   Level 5   Watts 75   Minutes 15      Nutrition:  Target Goals: Understanding of nutrition guidelines, daily intake of sodium <1569m, cholesterol <2041m calories 30% from fat and 7% or less from saturated fats, daily to have 5 or more servings of fruits and vegetables.  Biometrics:     Pre Biometrics - 06/29/15 1524    Pre Biometrics    Height 5' 3.25" (1.607 m)   Weight 222 lb 9.6 oz (100.971 kg)   Waist Circumference 43.25 inches   Hip Circumference 51 inches   Waist to Hip Ratio 0.85 %   BMI (Calculated) 39.2       Nutrition Therapy Plan and Nutrition Goals:   Nutrition Discharge: Rate Your Plate Scores:   Nutrition Goals Re-Evaluation:   Psychosocial: Target Goals: Acknowledge presence or absence of depression, maximize coping skills, provide positive support system. Participant is able to verbalize types and ability to use techniques and skills needed for reducing stress and depression.  Initial Review & Psychosocial Screening:     Initial Psych Review & Screening - 06/29/15 1333    Initial Review   Current issues with Current Depression;Current Anxiety/Panic;Current Psychotropic Meds;Current Stress Concerns   Source of Stress Concerns Occupation;Financial;Chronic  Illness;Unable to perform yard/household activities;Unable to participate in former interests or hobbies   [place]? Yes   Comments Great support while in hospital and at home.     Barriers   Psychosocial barriers to participate in program There are no identifiable barriers or psychosocial needs.;The patient should benefit from training in stress management and relaxation.   Screening Interventions   Interventions Encouraged to exercise;Program counselor consult      Quality of Life Scores:     Quality of Life - 06/30/15 0820    Quality of Life Scores   Health/Function Pre 20 %   Socioeconomic Pre 13.64 %   Psych/Spiritual Pre 18 %   Family Pre 26.4 %   GLOBAL Pre 19.26 %      PHQ-9:     Recent Review Flowsheet Data    Depression screen [facility] 2/9 06/29/2015   Decreased Interest 1   Down, Depressed, Hopeless 1   PHQ - 2 Score 2   Altered sleeping 1   Tired, decreased energy 1   Change in appetite 1   Feeling bad or failure about yourself  1   Trouble concentrating 1   Moving slowly or  fidgety/restless 1   Suicidal thoughts 0   PHQ-9 Score 8   Difficult doing work/chores Not difficult at all      Psychosocial Evaluation and Intervention:     Psychosocial Evaluation - 07/06/15 1707    Psychosocial Evaluation & Interventions   Interventions Stress management education;Relaxation education;Encouraged to exercise with the program and follow exercise prescription   Comments Counselor met with Ms. Brosseau today for initial psychosocial evaluation.  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  She has a strong support system with a spouse of 11 years and an Aunt who lives close by.  Ms. Neuser has other health issues complicating her recovery with GERD, IBS, Sleep Apnea and non-alcoholic fatty liver disease. She reports sleeping okay currently although the CPAP mask is a problem for her.  She also states her appetite is "too good" with a goal to lose some weight while in this program.  Ms. Messing reports a history of depression and anxiety and is currently on medication to treat this.  She continues to experience anxiety symptoms and was encouraged to speak with the doctor about adding a previous medication back that worked better for these symptoms.  Ms. Gentzler admits that although she is typically a positive person, she has stressors such as financial and her health that impact her mood.  She desires to learn more about healthier eating/preparation of foods in this program, to increase her stamina and strength and to lose some weight.  She plans to either work out at home on her treadmill or look into the follow up programs here at [facility] to see if they fit with her schedule once she completes this program.     Continued Psychosocial Services Needed Yes  Ms. Boulais will benefit from the psychoeducational components of this program as well as meeting with the dietician for her weight loss goals.  She also is encouraged to follow-up with her Dr. in regards to her anxiety symptoms  and possible Rx.      Psychosocial Re-Evaluation:   Vocational Rehabilitation: Provide vocational rehab assistance to qualifying candidates.   Vocational Rehab Evaluation & Intervention:     Vocational Rehab - 06/29/15 1324    Initial Vocational Rehab Evaluation & Intervention  Assessment shows need for Vocational Rehabilitation Yes   Documents faxed to [facility] Dept of Vocational Rehabilitation 06/29/15      Education: Education Goals: Education classes will be provided on a weekly basis, covering required topics. Participant will state understanding/return demonstration of topics presented.  Learning Barriers/Preferences:     Learning Barriers/Preferences - 06/29/15 1323    Learning Barriers/Preferences   Learning Barriers None   Learning Preferences Video      Education Topics: General Nutrition Guidelines/Fats and Fiber: -Group instruction provided by verbal, written material, models and posters to present the general guidelines for heart healthy nutrition. Gives an explanation and review of dietary fats and fiber.          Cardiac Rehab from 07/22/2015 in [facility] Cardiac Rehab   Date  07/06/15   Educator  PI   Instruction Review Code  2- meets goals/outcomes      Controlling Sodium/Reading Food Labels: -Group verbal and written material supporting the discussion of sodium use in heart healthy nutrition. Review and explanation with models, verbal and written materials for utilization of the food label.      Cardiac Rehab from 07/22/2015 in [facility] Cardiac Rehab   Date  07/13/15   Educator  PI   Instruction Review Code  2- meets goals/outcomes      Exercise Physiology & Risk Factors: - Group verbal and written instruction with models to review the exercise physiology of the cardiovascular system and associated critical values. Details cardiovascular disease risk factors and the goals associated with each risk factor.   Aerobic Exercise & Resistance Training: - Gives  group verbal and written discussion on the health impact of inactivity. On the components of aerobic and resistive training programs and the benefits of this training and how to safely progress through these programs.   Flexibility, Balance, General Exercise Guidelines: - Provides group verbal and written instruction on the benefits of flexibility and balance training programs. Provides general exercise guidelines with specific guidelines to those with heart or lung disease. Demonstration and skill practice provided.   Stress Management: - Provides group verbal and written instruction about the health risks of elevated stress, cause of high stress, and healthy ways to reduce stress.   Depression: - Provides group verbal and written instruction on the correlation between heart/lung disease and depressed mood, treatment options, and the stigmas associated with seeking treatment.   Anatomy & Physiology of the Heart: - Group verbal and written instruction and models provide basic cardiac anatomy and physiology, with the coronary electrical and arterial systems. Review of: AMI, Angina, Valve disease, Heart Failure, Cardiac Arrhythmia, Pacemakers, and the ICD.   Cardiac Procedures: - Group verbal and written instruction and models to describe the testing methods done to diagnose heart disease. Reviews the outcomes of the test results. Describes the treatment choices: Medical Management, Angioplasty, or Coronary Bypass Surgery.   Cardiac Medications: - Group verbal and written instruction to review commonly prescribed medications for heart disease. Reviews the medication, class of the drug, and side effects. Includes the steps to properly store meds and maintain the prescription regimen.      Cardiac Rehab from 07/22/2015 in [facility] Cardiac Rehab   Date  07/22/15   Educator  DW   Instruction Review Code  2- meets goals/outcomes      Go Sex-Intimacy & Heart Disease, Get SMART - Goal Setting: -  Group verbal and written instruction through game format to discuss heart disease and the return to sexual intimacy. Provides group verbal  and written material to discuss and apply goal setting through the application of the S.M.A.R.T. Method.   Other Matters of the Heart: - Provides group verbal, written materials and models to describe Heart Failure, Angina, Valve Disease, and Diabetes in the realm of heart disease. Includes description of the disease process and treatment options available to the cardiac patient.      Cardiac Rehab from 07/22/2015 in [facility] Cardiac Rehab   Date  07/01/15   Educator  DW   Instruction Review Code  2- meets goals/outcomes      Exercise & Equipment Safety: - Individual verbal instruction and demonstration of equipment use and safety with use of the equipment.      Cardiac Rehab from 07/22/2015 in [facility] Cardiac Rehab   Date  06/29/15   Educator  SB   Instruction Review Code  2- meets goals/outcomes      Infection Prevention: - Provides verbal and written material to individual with discussion of infection control including proper hand washing and proper equipment cleaning during exercise session.      Cardiac Rehab from 07/22/2015 in [facility] Cardiac Rehab   Date  06/29/15   Educator  SB   Instruction Review Code  2- meets goals/outcomes      Falls Prevention: - Provides verbal and written material to individual with discussion of falls prevention and safety.      Cardiac Rehab from 07/22/2015 in [facility] Cardiac Rehab   Date  06/29/15   Educator  SB   Instruction Review Code  2- meets goals/outcomes      Diabetes: - Individual verbal and written instruction to review signs/symptoms of diabetes, desired ranges of glucose level fasting, after meals and with exercise. Advice that pre and post exercise glucose checks will be done for 3 sessions at entry of program.    Knowledge Questionnaire Score:     Knowledge Questionnaire Score - 06/30/15 0749     Knowledge Questionnaire Score   Pre Score 23/28      Personal Goals and Risk Factors at Admission:     Personal Goals and Risk Factors at Admission - 06/29/15 1327    Personal Goals and Risk Factors on Admission    Weight Management Yes;Obesity   Intervention Learn and follow the exercise and diet guidelines while in the program. Utilize the nutrition and education classes to help gain knowledge of the diet and exercise expectations in the program   Intervention Provide weight management tools through evaluation completed by registered dietician and exercise physiologist.  Establish a goal weight with participant.   Goal Weight 160 lb (72.576 kg)   Increase Aerobic Exercise and Physical Activity Yes;Sedentary   Intervention While in program, learn and follow the exercise prescription taught. Start at a low level workload and increase workload after able to maintain previous level for 30 minutes. Increase time before increasing intensity.   Intervention Provide exercise education and an individualized exercise prescription that will provide continued progressive overload as per policy without signs/symptoms of physical distress.   Quit Smoking Yes   Number of packs per day 1 pack per week       Intervention Utilize your health care professional team to help with smoking cessation while in the program. Your doctor can prescribe medications to aid in cessation. The program can provide information and counseling as needed.   Take Less Medication Yes   Intervention Learn your risk factors and begin the lifestyle modifications for risk factor control during your time in the  program.   Understand more about Heart/Pulmonary Disease. Yes   Intervention While in program utilize professionals for any questions, and attend the education sessions. Great websites to use are www.americanheart.org or www.lung.org for reliable information.   Improve shortness of breath with ADL's Yes   Intervention While in  program, learn and follow the exercise prescription taught. Start at a low level workload and increase workload ad advised by the exercise physiologist. Increase time before increasing intensity.   Diabetes No   Hypertension Yes   Goal Participant will see blood pressure controlled within the values of 140/36m/Hg or within value directed by their physician.   Intervention Provide nutrition & aerobic exercise along with prescribed medications to achieve BP 140/90 or less.   Lipids Yes   Goal Cholesterol controlled with medications as prescribed, with individualized exercise RX and with personalized nutrition plan. Value goals: LDL < 713m HDL > 4071mParticipant states understanding of desired cholesterol values and following prescriptions.   Intervention Provide nutrition & aerobic exercise along with prescribed medications to achieve LDL <96m47mDL >40mg69mStress Yes   Goal To meet with psychosocial counselor for stress and relaxation information and guidance. To state understanding of performing relaxation techniques and or identifying personal stressors.      Personal Goals and Risk Factors Review:      Goals and Risk Factor Review      08/05/15 1040           Weight Management   Goals Progress/Improvement seen No       Comments No changes yet.  Is working on adding fruit to every meal and reading food labels.  Has RD appointment soon.       Increase Aerobic Exercise and Physical Activity   Goals Progress/Improvement seen  Yes       Comments Doing well with progression. Is adding 2 15 min exercise sesions at home.       Understand more about Heart/Pulmonary Disease   Comments RobinShirlean Mylar the education is informative and she is learning lots.          Personal Goals Discharge:     Comments:Shaneca "RobinShirlean Mylardoing well in Cardiac Rehab esp likes the Recumbent Elliptical REL 6000.

## 2015-08-05 NOTE — Progress Notes (Signed)
Daily Session Note  Patient Details  Name: Shelly Huynh MRN: [id number] Date of Birth: April 30, 1971 Referring Provider:  Corey Skains, MD  Encounter Date: 08/05/2015  Check In:      Goals Met:  Proper associated with RPD/PD & O2 Sat Exercise tolerated well  Goals Unmet:  Not Applicable  Goals Comments: [name] states she has made a lifestyle change after having open heart surgery   Dr. Emily Filbert is Medical Director for [facility] and [facility].

## 2015-08-05 NOTE — Progress Notes (Signed)
Cardiac Individual Treatment Plan  Patient Details  Name: Shelly Huynh MRN: [id number] Date of Birth: [birth year] Referring Provider:  Corey Skains, MD  Initial Encounter Date:    Visit Diagnosis: S/P aortic valve replacement  S/P mitral valve replacement  S/P tricuspid valve repair  Patient's Home Medications on Admission:  Current outpatient prescriptions:  .  albuterol (PROAIR HFA) 108 (90 BASE) MCG/ACT inhaler, Inhale into the lungs., Disp: , Rfl:  .  amoxicillin (AMOXIL) 500 MG capsule, Take 2,000 mg by mouth as needed (for dental procedure). , Disp: , Rfl:  .  aspirin 81 MG chewable tablet, Chew by mouth., Disp: , Rfl:  .  Biotin w/ Vitamins C & E 1250-7.5-7.5 MCG-MG-UNT CHEW, Chew 2 tablets by mouth daily., Disp: , Rfl:  .  buPROPion (WELLBUTRIN) 100 MG tablet, Take by mouth., Disp: , Rfl:  .  carvedilol (COREG) 3.125 MG tablet, Take by mouth., Disp: , Rfl:  .  cyclobenzaprine (FLEXERIL) 5 MG tablet, Take 5 mg by mouth at bedtime. , Disp: , Rfl:  .  diazepam (VALIUM) 5 MG tablet, Take by mouth., Disp: , Rfl:  .  fluticasone (FLONASE) 50 MCG/ACT nasal spray, Place into the nose., Disp: , Rfl:  .  furosemide (LASIX) 20 MG tablet, Take 20 mg by mouth daily. , Disp: , Rfl:  .  furosemide (LASIX) 40 MG tablet, Take by mouth., Disp: , Rfl:  .  furosemide (LASIX) 40 MG tablet, Take by mouth., Disp: , Rfl:  .  gabapentin (NEURONTIN) 300 MG capsule, Take by mouth., Disp: , Rfl:  .  gabapentin (NEURONTIN) 300 MG capsule, Take by mouth., Disp: , Rfl:  .  hydrALAZINE (APRESOLINE) 25 MG tablet, Take by mouth., Disp: , Rfl:  .  HYDROcodone-acetaminophen (NORCO/VICODIN) 5-325 MG per tablet, Take 1 tablet by mouth 2 (two) times daily as needed for moderate pain., Disp: , Rfl:  .  Linaclotide (LINZESS) 290 MCG CAPS capsule, Take by mouth., Disp: , Rfl:  .  loratadine (CLARITIN) 10 MG tablet, Take by mouth., Disp: , Rfl:  .  meclizine (ANTIVERT) 25 MG tablet, Take by mouth., Disp: ,  Rfl:  .  meloxicam (MOBIC) 15 MG tablet, Take 15 mg by mouth daily as needed for pain., Disp: , Rfl:  .  omeprazole (PRILOSEC) 20 MG capsule, Take 20 mg by mouth daily. , Disp: , Rfl:  .  oxyCODONE-acetaminophen (ROXICET) 5-325 MG per tablet, Take 1 tablet by mouth every 6 (six) hours as needed., Disp: 20 tablet, Rfl: 0 .  potassium chloride (MICRO-K) 10 MEQ CR capsule, Take by mouth., Disp: , Rfl:  .  potassium chloride (MICRO-K) 10 MEQ CR capsule, Take by mouth., Disp: , Rfl:  .  pravastatin (PRAVACHOL) 20 MG tablet, Take 20 mg by mouth daily. , Disp: , Rfl:  .  verapamil (CALAN-SR) 120 MG CR tablet, Take by mouth., Disp: , Rfl:  .  warfarin (COUMADIN) 7.5 MG tablet, Take by mouth., Disp: , Rfl:   Past Medical History: Past Medical History  Diagnosis Date  . Hypertension   . High cholesterol   . Anxiety   . IBS (irritable bowel syndrome)   . Acid reflux   . Sciatic pain   . Arthritis   . High cholesterol   . Hypokalemia     Tobacco Use: History  Smoking status  . Current Every Day Smoker  Smokeless tobacco  . Not on file    Labs: Recent Review Flowsheet Data    Labs for ITP  Cardiac and Pulmonary Rehab Latest Ref Rng 03/04/2015   PHART 7.350 - 7.450 7.402   PCO2ART 35.0 - 45.0 mmHg 34.4(L)   HCO3 20.0 - 24.0 mEq/L 21.3   TCO2 0 - 100 mmol/L 22   ACIDBASEDEF 0.0 - 2.0 mmol/L 3.0(H)   O2SAT - 88.0       Exercise Target Goals:    Exercise Program Goal: Individual exercise prescription set with THRR, safety & activity barriers. Participant demonstrates ability to understand and report RPE using BORG scale, to self-measure pulse accurately, and to acknowledge the importance of the exercise prescription.  Exercise Prescription Goal: Starting with aerobic activity 30 plus minutes a day, 3 days per week for initial exercise prescription. Provide home exercise prescription and guidelines that participant acknowledges understanding prior to discharge.  Activity Barriers &  Risk Stratification:     Activity Barriers & Risk Stratification - 06/29/15 1322    Activity Barriers & Risk Stratification   Activity Barriers Arthritis;Back Problems;Neck/Spine Problems;Other (comment)   Comments Vertigo   Risk Stratification High      6 Minute Walk:     6 Minute Walk      06/29/15 1517       6 Minute Walk   Phase Initial     Distance 1350 feet     Walk Time 6 minutes     Resting HR 84 bpm     Resting BP 142/90 mmHg     Max Ex. HR 109 bpm     Max Ex. BP 160/86 mmHg     RPE 11        Initial Exercise Prescription:     Initial Exercise Prescription - 06/29/15 1500    Date of Initial Exercise Prescription   Date 06/29/15   Treadmill   MPH 2.3   Grade 0   Minutes 15   Bike   Level 1.2   Minutes 15   Recumbant Bike   Level 2   RPM 40   Watts 30   Minutes 15   NuStep   Level 3   Watts 30   Minutes 15   Arm Ergometer   Level 1   Watts 10   Minutes 15   Arm/Foot Ergometer   Level 1   Watts 15   Minutes 15   Cybex   Level 2   RPM 40   Minutes 15   Recumbant Elliptical   Level 1   RPM 30   Watts 15   Minutes 15   Elliptical   Level 1   Speed 3   Minutes 5   REL-XR   Level 3   Watts 30   Minutes 15   Prescription Details   Frequency (times per week) 3   Duration Progress to 30 minutes of continuous aerobic without signs/symptoms of physical distress   Intensity   THHR 40-80% of Max Heartrate 120-140   Ratings of Perceived Exertion 11-13   Progression Continue progressive overload as per policy without signs/symptoms or physical distress.   Resistance Training   Training Prescription Yes   Weight 2   Reps 10-12      Exercise Prescription Changes:     Exercise Prescription Changes      08/03/15 0600           Exercise Review   Progression Yes       Response to Exercise   Blood Pressure (Admit) 120/66 mmHg       Blood Pressure (Exercise) 150/70 mmHg  Blood Pressure (Exit) 142/90 mmHg       Heart Rate  (Admit) 87 bpm       Heart Rate (Exercise) 95 bpm       Heart Rate (Exit) 89 bpm       Rating of Perceived Exertion (Exercise) 15       Symptoms No       Duration Progress to 30 minutes of continuous aerobic without signs/symptoms of physical distress       Intensity Other (comment)  120-140 (40-80% HR max)       Progression Continue progressive overload as per policy without signs/symptoms or physical distress.       Resistance Training   Training Prescription Yes       Weight 3       Reps 10-15       Interval Training   Interval Training Yes       Equipment REL-XR;NuStep       NuStep   Level 4       Watts 35       Minutes 15       REL-XR   Level 5       Watts 75       Minutes 15          Discharge Exercise Prescription (Final Exercise Prescription Changes):     Exercise Prescription Changes - 08/03/15 0600    Exercise Review   Progression Yes   Response to Exercise   Blood Pressure (Admit) 120/66 mmHg   Blood Pressure (Exercise) 150/70 mmHg   Blood Pressure (Exit) 142/90 mmHg   Heart Rate (Admit) 87 bpm   Heart Rate (Exercise) 95 bpm   Heart Rate (Exit) 89 bpm   Rating of Perceived Exertion (Exercise) 15   Symptoms No   Duration Progress to 30 minutes of continuous aerobic without signs/symptoms of physical distress   Intensity Other (comment)  120-140 (40-80% HR max)   Progression Continue progressive overload as per policy without signs/symptoms or physical distress.   Resistance Training   Training Prescription Yes   Weight 3   Reps 10-15   Interval Training   Interval Training Yes   Equipment REL-XR;NuStep   NuStep   Level 4   Watts 35   Minutes 15   REL-XR   Level 5   Watts 75   Minutes 15      Nutrition:  Target Goals: Understanding of nutrition guidelines, daily intake of sodium <1558m, cholesterol <2026m calories 30% from fat and 7% or less from saturated fats, daily to have 5 or more servings of fruits and vegetables.  Biometrics:      Pre Biometrics - 06/29/15 1524    Pre Biometrics   Height 5' 3.25" (1.607 m)   Weight 222 lb 9.6 oz (100.971 kg)   Waist Circumference 43.25 inches   Hip Circumference 51 inches   Waist to Hip Ratio 0.85 %   BMI (Calculated) 39.2       Nutrition Therapy Plan and Nutrition Goals:   Nutrition Discharge: Rate Your Plate Scores:   Nutrition Goals Re-Evaluation:   Psychosocial: Target Goals: Acknowledge presence or absence of depression, maximize coping skills, provide positive support system. Participant is able to verbalize types and ability to use techniques and skills needed for reducing stress and depression.  Initial Review & Psychosocial Screening:     Initial Psych Review & Screening - 06/29/15 1333    Initial Review   Current issues with Current Depression;Current Anxiety/Panic;Current  Psychotropic Meds;Current Stress Concerns   Source of Stress Concerns Occupation;Financial;Chronic Illness;Unable to perform yard/household activities;Unable to participate in former interests or hobbies   [place]? Yes   Comments Great support while in hospital and at home.     Barriers   Psychosocial barriers to participate in program There are no identifiable barriers or psychosocial needs.;The patient should benefit from training in stress management and relaxation.   Screening Interventions   Interventions Encouraged to exercise;Program counselor consult      Quality of Life Scores:     Quality of Life - 06/30/15 0820    Quality of Life Scores   Health/Function Pre 20 %   Socioeconomic Pre 13.64 %   Psych/Spiritual Pre 18 %   Family Pre 26.4 %   GLOBAL Pre 19.26 %      PHQ-9:     Recent Review Flowsheet Data    Depression screen [facility] 2/9 06/29/2015   Decreased Interest 1   Down, Depressed, Hopeless 1   PHQ - 2 Score 2   Altered sleeping 1   Tired, decreased energy 1   Change in appetite 1   Feeling bad or failure about yourself  1    Trouble concentrating 1   Moving slowly or fidgety/restless 1   Suicidal thoughts 0   PHQ-9 Score 8   Difficult doing work/chores Not difficult at all      Psychosocial Evaluation and Intervention:     Psychosocial Evaluation - 07/06/15 1707    Psychosocial Evaluation & Interventions   Interventions Stress management education;Relaxation education;Encouraged to exercise with the program and follow exercise prescription   Comments Counselor met with Ms. Wyer today for initial psychosocial evaluation.  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  She has a strong support system with a spouse of 11 years and an Aunt who lives close by.  Ms. Greenhouse has other health issues complicating her recovery with GERD, IBS, Sleep Apnea and non-alcoholic fatty liver disease. She reports sleeping okay currently although the CPAP mask is a problem for her.  She also states her appetite is "too good" with a goal to lose some weight while in this program.  Ms. Shanks reports a history of depression and anxiety and is currently on medication to treat this.  She continues to experience anxiety symptoms and was encouraged to speak with the doctor about adding a previous medication back that worked better for these symptoms.  Ms. Sharps admits that although she is typically a positive person, she has stressors such as financial and her health that impact her mood.  She desires to learn more about healthier eating/preparation of foods in this program, to increase her stamina and strength and to lose some weight.  She plans to either work out at home on her treadmill or look into the follow up programs here at [facility] to see if they fit with her schedule once she completes this program.     Continued Psychosocial Services Needed Yes  Ms. Grahn will benefit from the psychoeducational components of this program as well as meeting with the dietician for her weight loss goals.  She also is encouraged to follow-up  with her Dr. in regards to her anxiety symptoms and possible Rx.      Psychosocial Re-Evaluation:   Vocational Rehabilitation: Provide vocational rehab assistance to qualifying candidates.   Vocational Rehab Evaluation & Intervention:     Vocational Rehab - 06/29/15 1324  Initial Vocational Rehab Evaluation & Intervention   Assessment shows need for Vocational Rehabilitation Yes   Documents faxed to [facility] 06/29/15      Education: Education Goals: Education classes will be provided on a weekly basis, covering required topics. Participant will state understanding/return demonstration of topics presented.  Learning Barriers/Preferences:     Learning Barriers/Preferences - 06/29/15 1323    Learning Barriers/Preferences   Learning Barriers None   Learning Preferences Video      Education Topics: General Nutrition Guidelines/Fats and Fiber: -Group instruction provided by verbal, written material, models and posters to present the general guidelines for heart healthy nutrition. Gives an explanation and review of dietary fats and fiber.          Cardiac Rehab from 07/22/2015 in [facility] Cardiac Rehab   Date  07/06/15   Educator  PI   Instruction Review Code  2- meets goals/outcomes      Controlling Sodium/Reading Food Labels: -Group verbal and written material supporting the discussion of sodium use in heart healthy nutrition. Review and explanation with models, verbal and written materials for utilization of the food label.      Cardiac Rehab from 07/22/2015 in [facility] Cardiac Rehab   Date  07/13/15   Educator  PI   Instruction Review Code  2- meets goals/outcomes      Exercise Physiology & Risk Factors: - Group verbal and written instruction with models to review the exercise physiology of the cardiovascular system and associated critical values. Details cardiovascular disease risk factors and the goals associated with each risk  factor.   Aerobic Exercise & Resistance Training: - Gives group verbal and written discussion on the health impact of inactivity. On the components of aerobic and resistive training programs and the benefits of this training and how to safely progress through these programs.   Flexibility, Balance, General Exercise Guidelines: - Provides group verbal and written instruction on the benefits of flexibility and balance training programs. Provides general exercise guidelines with specific guidelines to those with heart or lung disease. Demonstration and skill practice provided.   Stress Management: - Provides group verbal and written instruction about the health risks of elevated stress, cause of high stress, and healthy ways to reduce stress.   Depression: - Provides group verbal and written instruction on the correlation between heart/lung disease and depressed mood, treatment options, and the stigmas associated with seeking treatment.   Anatomy & Physiology of the Heart: - Group verbal and written instruction and models provide basic cardiac anatomy and physiology, with the coronary electrical and arterial systems. Review of: AMI, Angina, Valve disease, Heart Failure, Cardiac Arrhythmia, Pacemakers, and the ICD.   Cardiac Procedures: - Group verbal and written instruction and models to describe the testing methods done to diagnose heart disease. Reviews the outcomes of the test results. Describes the treatment choices: Medical Management, Angioplasty, or Coronary Bypass Surgery.   Cardiac Medications: - Group verbal and written instruction to review commonly prescribed medications for heart disease. Reviews the medication, class of the drug, and side effects. Includes the steps to properly store meds and maintain the prescription regimen.      Cardiac Rehab from 07/22/2015 in [facility] Cardiac Rehab   Date  07/22/15   Educator  DW   Instruction Review Code  2- meets goals/outcomes       Go Sex-Intimacy & Heart Disease, Get SMART - Goal Setting: - Group verbal and written instruction through game format to discuss heart disease and  the return to sexual intimacy. Provides group verbal and written material to discuss and apply goal setting through the application of the S.M.A.R.T. Method.   Other Matters of the Heart: - Provides group verbal, written materials and models to describe Heart Failure, Angina, Valve Disease, and Diabetes in the realm of heart disease. Includes description of the disease process and treatment options available to the cardiac patient.      Cardiac Rehab from 07/22/2015 in [facility] Cardiac Rehab   Date  07/01/15   Educator  DW   Instruction Review Code  2- meets goals/outcomes      Exercise & Equipment Safety: - Individual verbal instruction and demonstration of equipment use and safety with use of the equipment.      Cardiac Rehab from 07/22/2015 in [facility] Cardiac Rehab   Date  06/29/15   Educator  SB   Instruction Review Code  2- meets goals/outcomes      Infection Prevention: - Provides verbal and written material to individual with discussion of infection control including proper hand washing and proper equipment cleaning during exercise session.      Cardiac Rehab from 07/22/2015 in [facility] Cardiac Rehab   Date  06/29/15   Educator  SB   Instruction Review Code  2- meets goals/outcomes      Falls Prevention: - Provides verbal and written material to individual with discussion of falls prevention and safety.      Cardiac Rehab from 07/22/2015 in [facility] Cardiac Rehab   Date  06/29/15   Educator  SB   Instruction Review Code  2- meets goals/outcomes      Diabetes: - Individual verbal and written instruction to review signs/symptoms of diabetes, desired ranges of glucose level fasting, after meals and with exercise. Advice that pre and post exercise glucose checks will be done for 3 sessions at entry of program.    Knowledge Questionnaire  Score:     Knowledge Questionnaire Score - 06/30/15 0749    Knowledge Questionnaire Score   Pre Score 23/28      Personal Goals and Risk Factors at Admission:     Personal Goals and Risk Factors at Admission - 06/29/15 1327    Personal Goals and Risk Factors on Admission    Weight Management Yes;Obesity   Intervention Learn and follow the exercise and diet guidelines while in the program. Utilize the nutrition and education classes to help gain knowledge of the diet and exercise expectations in the program   Intervention Provide weight management tools through evaluation completed by registered dietician and exercise physiologist.  Establish a goal weight with participant.   Goal Weight 160 lb (72.576 kg)   Increase Aerobic Exercise and Physical Activity Yes;Sedentary   Intervention While in program, learn and follow the exercise prescription taught. Start at a low level workload and increase workload after able to maintain previous level for 30 minutes. Increase time before increasing intensity.   Intervention Provide exercise education and an individualized exercise prescription that will provide continued progressive overload as per policy without signs/symptoms of physical distress.   Quit Smoking Yes   Number of packs per day 1 pack per week       Intervention Utilize your health care professional team to help with smoking cessation while in the program. Your doctor can prescribe medications to aid in cessation. The program can provide information and counseling as needed.   Take Less Medication Yes   Intervention Learn your risk factors and begin the lifestyle modifications for  risk factor control during your time in the program.   Understand more about Heart/Pulmonary Disease. Yes   Intervention While in program utilize professionals for any questions, and attend the education sessions. Great websites to use are www.americanheart.org or www.lung.org for reliable information.    Improve shortness of breath with ADL's Yes   Intervention While in program, learn and follow the exercise prescription taught. Start at a low level workload and increase workload ad advised by the exercise physiologist. Increase time before increasing intensity.   Diabetes No   Hypertension Yes   Goal Participant will see blood pressure controlled within the values of 140/47m/Hg or within value directed by their physician.   Intervention Provide nutrition & aerobic exercise along with prescribed medications to achieve BP 140/90 or less.   Lipids Yes   Goal Cholesterol controlled with medications as prescribed, with individualized exercise RX and with personalized nutrition plan. Value goals: LDL < 741m HDL > 4085mParticipant states understanding of desired cholesterol values and following prescriptions.   Intervention Provide nutrition & aerobic exercise along with prescribed medications to achieve LDL <68m60mDL >40mg84mStress Yes   Goal To meet with psychosocial counselor for stress and relaxation information and guidance. To state understanding of performing relaxation techniques and or identifying personal stressors.      Personal Goals and Risk Factors Review:      Goals and Risk Factor Review      08/05/15 1040           Weight Management   Goals Progress/Improvement seen No       Comments No changes yet.  Is working on adding fruit to every meal and reading food labels.  Has RD appointment soon.       Increase Aerobic Exercise and Physical Activity   Goals Progress/Improvement seen  Yes       Comments Doing well with progression. Is adding 2 15 min exercise sesions at home.       Understand more about Heart/Pulmonary Disease   Comments RobinShirlean Mylar the education is informative and she is learning lots.          Personal Goals Discharge:     Comments: 30 day review Continue with current ITP.

## 2015-08-07 ENCOUNTER — Ambulatory Visit
Admission: RE | Admit: 2015-08-07 | Discharge: 2015-08-07 | Disposition: A | Discharge status: Home / Self Care | Payer: Managed Care, Other (non HMO) | Source: Ambulatory Visit | Attending: Orthopedic Surgery | Admitting: Orthopedic Surgery

## 2015-08-07 DIAGNOSIS — M5416 Radiculopathy, lumbar region: Secondary | ICD-10-CM

## 2015-08-07 DIAGNOSIS — M4306 Spondylolysis, lumbar region: Secondary | ICD-10-CM

## 2015-08-11 ENCOUNTER — Other Ambulatory Visit: Payer: Self-pay | Admitting: Orthopedic Surgery

## 2015-08-11 ENCOUNTER — Other Ambulatory Visit: Payer: Self-pay

## 2015-08-11 DIAGNOSIS — M4306 Spondylolysis, lumbar region: Secondary | ICD-10-CM

## 2015-08-11 DIAGNOSIS — M5416 Radiculopathy, lumbar region: Secondary | ICD-10-CM

## 2015-08-19 ENCOUNTER — Encounter: Payer: Self-pay | Admitting: *Deleted

## 2015-08-19 NOTE — Progress Notes (Signed)
Mr. Schnieders called Cardiac Rehab department today to inform us that Shelly Huynh has had a cold/URI this week and will not be able to attend Cardiac Rehab sessions for the rest of the week.  Mr. Gullickson is hopeful that she will be able to return to Cardiac Rehab next week (week of October 3rd - 7th).

## 2015-08-23 ENCOUNTER — Ambulatory Visit
Admission: RE | Admit: 2015-08-23 | Discharge: 2015-08-23 | Disposition: A | Discharge status: Home / Self Care | Payer: Managed Care, Other (non HMO) | Source: Ambulatory Visit | Attending: Orthopedic Surgery | Admitting: Orthopedic Surgery

## 2015-08-23 ENCOUNTER — Other Ambulatory Visit: Payer: Self-pay | Admitting: Orthopedic Surgery

## 2015-08-23 DIAGNOSIS — M5416 Radiculopathy, lumbar region: Secondary | ICD-10-CM

## 2015-08-23 DIAGNOSIS — M4306 Spondylolysis, lumbar region: Secondary | ICD-10-CM

## 2015-08-25 ENCOUNTER — Other Ambulatory Visit: Payer: Self-pay

## 2015-08-25 ENCOUNTER — Encounter: Payer: Self-pay | Admitting: *Deleted

## 2015-08-25 ENCOUNTER — Other Ambulatory Visit: Payer: Self-pay | Admitting: Internal Medicine

## 2015-08-25 DIAGNOSIS — R1012 Left upper quadrant pain: Secondary | ICD-10-CM

## 2015-08-25 DIAGNOSIS — K219 Gastro-esophageal reflux disease without esophagitis: Secondary | ICD-10-CM

## 2015-08-25 MED ORDER — OMEPRAZOLE 20 MG PO CPDR: 20.0000 mg | DELAYED_RELEASE_CAPSULE | Freq: Every day | ORAL | Status: DC

## 2015-08-26 ENCOUNTER — Telehealth: Payer: Self-pay | Admitting: *Deleted

## 2015-08-26 ENCOUNTER — Encounter: Payer: Managed Care, Other (non HMO) | Admitting: *Deleted

## 2015-08-26 ENCOUNTER — Encounter: Payer: Self-pay | Admitting: *Deleted

## 2015-08-26 DIAGNOSIS — Z952 Presence of prosthetic heart valve: Secondary | ICD-10-CM
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
## 2015-08-28 ENCOUNTER — Ambulatory Visit: Payer: Managed Care, Other (non HMO)

## 2015-08-31 ENCOUNTER — Encounter: Payer: Managed Care, Other (non HMO) | Attending: Internal Medicine

## 2015-08-31 DIAGNOSIS — Z954 Presence of other heart-valve replacement: Secondary | ICD-10-CM | POA: Insufficient documentation

## 2015-08-31 DIAGNOSIS — Z9889 Other specified postprocedural states: Secondary | ICD-10-CM | POA: Insufficient documentation

## 2015-09-02 ENCOUNTER — Encounter: Payer: Self-pay | Admitting: *Deleted

## 2015-09-02 ENCOUNTER — Encounter: Payer: Managed Care, Other (non HMO) | Admitting: *Deleted

## 2015-09-02 DIAGNOSIS — Z952 Presence of prosthetic heart valve: Secondary | ICD-10-CM

## 2015-09-02 DIAGNOSIS — Z9889 Other specified postprocedural states: Secondary | ICD-10-CM | POA: Diagnosis not present

## 2015-09-02 DIAGNOSIS — Z954 Presence of other heart-valve replacement: Secondary | ICD-10-CM | POA: Diagnosis present
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
## 2015-09-02 NOTE — Progress Notes (Signed)
Daily Session Note  Patient Details  Name: Shelly Huynh MRN: [id number] Date of Birth: Oct 23, 1971 Referring Provider:  Glendon Axe, MD  Encounter Date: 09/02/2015  Check In:     Session Check In - 09/02/15 [name]    Check-In   Staff Present Lestine Box BS, ACSM EP-C, Exercise Physiologist;Renee Dillard Essex MS, ACSM CEP Exercise Physiologist;Carroll Enterkin RN, BSN   ER physicians immediately available to respond to emergencies See telemetry face sheet for immediately available ER MD   Medication changes reported     No   Fall or balance concerns reported    No   Warm-up and Cool-down Performed on first and last piece of equipment   VAD Patient? No         Goals Met:  Proper associated with RPD/PD & O2 Sat Exercise tolerated well No report of cardiac concerns or symptoms Strength training completed today  Goals Unmet:  Not Applicable  Goals Comments:    Dr. Emily Filbert is Medical Director for [facility].

## 2015-09-03 ENCOUNTER — Other Ambulatory Visit: Payer: Managed Care, Other (non HMO)

## 2015-09-03 DIAGNOSIS — Z954 Presence of other heart-valve replacement: Secondary | ICD-10-CM | POA: Diagnosis not present

## 2015-09-03 DIAGNOSIS — Z952 Presence of prosthetic heart valve: Secondary | ICD-10-CM

## 2015-09-03 NOTE — Progress Notes (Signed)
Daily Session Note  Patient Details  Name: Mckinzie Saksa MRN: [id number] Date of Birth: 25-Jul-1971 Referring Provider:  Glendon Axe, MD  Encounter Date: 09/03/2015  Check In:     Session Check In - 09/03/15 1746    Check-In   Staff Present Gerlene Burdock RN, BSN;Dajuana Palen BS, ACSM EP-C, Exercise Physiologist;Diane Mariana Arn, BSN   ER physicians immediately available to respond to emergencies See telemetry face sheet for immediately available ER MD   Medication changes reported     No   Fall or balance concerns reported    No   Warm-up and Cool-down Performed on first and last piece of equipment   VAD Patient? No         Goals Met:  Proper associated with RPD/PD & O2 Sat Exercise tolerated well No report of cardiac concerns or symptoms Strength training completed today  Goals Unmet:  Not Applicable  Goals Comments:    Dr. Emily Filbert is Medical Director for [facility].

## 2015-09-04 ENCOUNTER — Ambulatory Visit
Admission: RE | Admit: 2015-09-04 | Discharge: 2015-09-04 | Disposition: A | Discharge status: Home / Self Care | Payer: Managed Care, Other (non HMO) | Source: Ambulatory Visit | Attending: Internal Medicine | Admitting: Internal Medicine

## 2015-09-04 DIAGNOSIS — K760 Fatty (change of) liver, not elsewhere classified: Secondary | ICD-10-CM | POA: Diagnosis not present

## 2015-09-04 DIAGNOSIS — R1012 Left upper quadrant pain: Secondary | ICD-10-CM | POA: Diagnosis present

## 2015-09-09 ENCOUNTER — Encounter: Payer: Managed Care, Other (non HMO) | Admitting: *Deleted

## 2015-09-09 DIAGNOSIS — Z9889 Other specified postprocedural states: Secondary | ICD-10-CM

## 2015-09-09 DIAGNOSIS — Z952 Presence of prosthetic heart valve: Secondary | ICD-10-CM

## 2015-09-09 DIAGNOSIS — Z954 Presence of other heart-valve replacement: Secondary | ICD-10-CM | POA: Diagnosis not present

## 2015-09-09 NOTE — Progress Notes (Signed)
Daily Session Note  Patient Details  Name: Shelly Huynh MRN: [id number] Date of Birth: [birth year] Referring Provider:  Corey Skains, MD  Encounter Date: 09/09/2015  Check In:     Session Check In - 09/09/15 1713    Check-In   Staff Present Gerlene Burdock RN, BSN;Renee Dillard Essex MS, ACSM CEP Exercise Physiologist;Tony Friscia Mariana Arn, BSN   ER physicians immediately available to respond to emergencies See telemetry face sheet for immediately available ER MD   Medication changes reported     No   Fall or balance concerns reported    No   Warm-up and Cool-down Performed on first and last piece of equipment   VAD Patient? No   Pain Assessment   Currently in Pain? No/denies   Multiple Pain Sites No           Exercise Prescription Changes - 09/09/15 1700    Exercise Review   Progression Yes   Response to Exercise   Blood Pressure (Admit) --   Blood Pressure (Exercise) --   Blood Pressure (Exit) --   Heart Rate (Admit) --   Heart Rate (Exercise) --   Heart Rate (Exit) --   Rating of Perceived Exertion (Exercise) --   Symptoms --   Comments --   Duration Progress to 30 minutes of continuous aerobic without signs/symptoms of physical distress   Intensity Other (comment)  120-140 (40-80% HR max)   Progression Continue progressive overload as per policy without signs/symptoms or physical distress.   Resistance Training   Training Prescription Yes   Weight 3   Reps 10-15   Interval Training   Interval Training Yes   Equipment REL-XR;NuStep   NuStep   Level 4   Watts 35   Minutes 20   REL-XR   Level 5   Watts 75   Minutes 20   Home Exercise Plan   Plans to continue exercise at Home  1-2days/week home exercise      Goals Met:  Exercise tolerated well Personal goals reviewed No report of cardiac concerns or symptoms Strength training completed today  Goals Unmet:  Not Applicable  Goals Comments: Patient tolerated exercise well.  Talked to patient about  adding 1 - d days/week exercise at home.     Dr. Emily Filbert is Medical Director for [facility].

## 2015-09-17 ENCOUNTER — Telehealth: Payer: Self-pay | Admitting: *Deleted

## 2015-09-17 ENCOUNTER — Encounter: Payer: Self-pay | Admitting: *Deleted

## 2015-09-17 DIAGNOSIS — Z952 Presence of prosthetic heart valve: Secondary | ICD-10-CM

## 2015-09-17 NOTE — Telephone Encounter (Signed)
Her husband to call to say she is tired and won't be exercising in Cardiac Rehab this evening.
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
## 2015-09-21 ENCOUNTER — Encounter: Payer: Self-pay | Admitting: *Deleted

## 2015-09-21 DIAGNOSIS — Z954 Presence of other heart-valve replacement: Secondary | ICD-10-CM | POA: Diagnosis not present

## 2015-09-21 DIAGNOSIS — Z952 Presence of prosthetic heart valve: Secondary | ICD-10-CM

## 2015-09-21 NOTE — Progress Notes (Signed)
Daily Session Note  Patient Details  Name: Shelly Huynh MRN: [id number] Date of Birth: [birth year] Referring Provider:  Corey Skains, MD  Encounter Date: 09/21/2015  Check In:     Session Check In - 09/21/15 1636    Check-In   Staff Present Heath Lark RN, BSN, CCRP;Cayle Thunder BS, ACSM EP-C, Exercise Physiologist;Carroll Enterkin RN, BSN   ER physicians immediately available to respond to emergencies See telemetry face sheet for immediately available ER MD   Medication changes reported     No   Fall or balance concerns reported    No   Warm-up and Cool-down Performed on first and last piece of equipment   VAD Patient? No   Pain Assessment   Currently in Pain? No/denies           Exercise Prescription Changes - 09/21/15 1000    Exercise Review   Progression Yes   Response to Exercise   Blood Pressure (Admit) 144/86 mmHg   Blood Pressure (Exercise) 150/88 mmHg   Blood Pressure (Exit) 134/80 mmHg   Heart Rate (Admit) 87 bpm   Heart Rate (Exercise) 85 bpm   Heart Rate (Exit) 68 bpm   Rating of Perceived Exertion (Exercise) 14   Symptoms None   Comments Discussed adding a day of exercise at home. Shelly Huynh said she can walk on her treadmill at home. We disucssed the FITT principle as it relates to aerobic exercise so she can apply this to her home exercise routine.    Frequency Add 1 additional day to program exercise sessions.   Duration Progress to 30 minutes of continuous aerobic without signs/symptoms of physical distress   Intensity Other (comment)  120-140 (40-80% HR max)   Progression Continue progressive overload as per policy without signs/symptoms or physical distress.   Resistance Training   Training Prescription Yes   Weight 3   Reps 10-15   Interval Training   Interval Training Yes   Equipment REL-XR;NuStep   NuStep   Level 4   Watts 35   Minutes 20   REL-XR   Level 5   Watts 75   Minutes 20   Home Exercise Plan   Plans to continue exercise at  --      Goals Met:  Proper associated with RPD/PD & O2 Sat Exercise tolerated well No report of cardiac concerns or symptoms Strength training completed today  Goals Unmet:  Not Applicable  Goals Comments:   Dr. Emily Filbert is Medical Director for [place] and [facility].

## 2015-09-23 ENCOUNTER — Encounter: Payer: Self-pay | Admitting: *Deleted

## 2015-09-23 ENCOUNTER — Encounter: Payer: Managed Care, Other (non HMO) | Attending: Internal Medicine

## 2015-09-23 ENCOUNTER — Telehealth: Payer: Self-pay | Admitting: *Deleted

## 2015-09-23 DIAGNOSIS — Z9889 Other specified postprocedural states: Secondary | ICD-10-CM | POA: Insufficient documentation

## 2015-09-23 DIAGNOSIS — Z954 Presence of other heart-valve replacement: Secondary | ICD-10-CM | POA: Insufficient documentation

## 2015-09-23 DIAGNOSIS — Z952 Presence of prosthetic heart valve: Secondary | ICD-10-CM

## 2015-09-23 NOTE — Telephone Encounter (Signed)
Husband called in to say Shelly Huynh will not be in class today because she has some swollen lymph nodes.

## 2015-09-24 ENCOUNTER — Encounter: Payer: Self-pay | Admitting: *Deleted

## 2015-09-24 NOTE — Progress Notes (Signed)
Patient called to report that she would be unable to attend Cardiac Rehab today due to an ear infection.  Shelly Huynh hopes to return on Monday.

## 2015-09-28 ENCOUNTER — Encounter: Payer: Managed Care, Other (non HMO) | Admitting: *Deleted

## 2015-09-28 DIAGNOSIS — Z954 Presence of other heart-valve replacement: Secondary | ICD-10-CM | POA: Diagnosis present

## 2015-09-28 DIAGNOSIS — Z952 Presence of prosthetic heart valve: Secondary | ICD-10-CM

## 2015-09-28 DIAGNOSIS — Z9889 Other specified postprocedural states: Secondary | ICD-10-CM | POA: Diagnosis not present

## 2015-09-28 NOTE — Progress Notes (Signed)
Daily Session Note  Patient Details  Name: Gayna Braddy MRN: [id number] Date of Birth: 1971/04/06 Referring Provider:  Glendon Axe, MD  Encounter Date: 09/28/2015  Check In:     Session Check In - 09/28/15 1637    Check-In   Staff Present Gerlene Burdock RN, BSN;Steven Way BS, ACSM EP-C, Exercise Physiologist;Susanne Bice RN, BSN, [place]   ER physicians immediately available to respond to emergencies See telemetry face sheet for immediately available ER MD   Medication changes reported     No   Fall or balance concerns reported    No   Warm-up and Cool-down Performed on first and last piece of equipment   VAD Patient? No   VAD patient   Has back up controller? No   Pain Assessment   Currently in Pain? No/denies         Goals Met:  Proper associated with RPD/PD & O2 Sat Exercise tolerated well No report of cardiac concerns or symptoms  Goals Unmet:  Not Applicable  Goals Comments:    Dr. Emily Filbert is Medical Director for [place] and [facility].
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
## 2015-09-30 ENCOUNTER — Encounter: Payer: Self-pay | Admitting: *Deleted

## 2015-09-30 DIAGNOSIS — Z952 Presence of prosthetic heart valve: Secondary | ICD-10-CM
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
## 2015-10-12 ENCOUNTER — Ambulatory Visit: Payer: Managed Care, Other (non HMO)

## 2015-10-12 DIAGNOSIS — K219 Gastro-esophageal reflux disease without esophagitis: Secondary | ICD-10-CM | POA: Insufficient documentation

## 2015-10-12 DIAGNOSIS — M47816 Spondylosis without myelopathy or radiculopathy, lumbar region: Secondary | ICD-10-CM | POA: Insufficient documentation

## 2015-10-14 ENCOUNTER — Ambulatory Visit: Payer: Managed Care, Other (non HMO)

## 2015-10-19 ENCOUNTER — Ambulatory Visit: Payer: Managed Care, Other (non HMO)

## 2015-10-21 ENCOUNTER — Ambulatory Visit: Payer: Managed Care, Other (non HMO)

## 2015-10-22 ENCOUNTER — Ambulatory Visit: Payer: Managed Care, Other (non HMO) | Admitting: *Deleted
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
## 2015-10-26 ENCOUNTER — Ambulatory Visit: Payer: Managed Care, Other (non HMO)

## 2015-10-28 ENCOUNTER — Ambulatory Visit: Payer: Managed Care, Other (non HMO)

## 2015-10-29 ENCOUNTER — Ambulatory Visit: Payer: Managed Care, Other (non HMO)

## 2015-11-02 ENCOUNTER — Ambulatory Visit: Payer: Managed Care, Other (non HMO)

## 2015-11-04 ENCOUNTER — Ambulatory Visit: Payer: Managed Care, Other (non HMO)

## 2015-11-05 ENCOUNTER — Ambulatory Visit: Payer: Managed Care, Other (non HMO)

## 2015-11-09 ENCOUNTER — Ambulatory Visit: Payer: Managed Care, Other (non HMO)

## 2015-11-11 ENCOUNTER — Ambulatory Visit: Payer: Managed Care, Other (non HMO)

## 2015-11-12 ENCOUNTER — Ambulatory Visit: Payer: Managed Care, Other (non HMO)

## 2015-11-18 ENCOUNTER — Ambulatory Visit: Payer: Managed Care, Other (non HMO)

## 2015-11-19 ENCOUNTER — Ambulatory Visit: Payer: Managed Care, Other (non HMO)

## 2015-11-25 ENCOUNTER — Ambulatory Visit: Payer: Managed Care, Other (non HMO)

## 2015-11-26 ENCOUNTER — Ambulatory Visit: Payer: Managed Care, Other (non HMO)

## 2015-11-30 ENCOUNTER — Ambulatory Visit: Payer: Managed Care, Other (non HMO)

## 2015-12-02 ENCOUNTER — Ambulatory Visit: Payer: Managed Care, Other (non HMO)

## 2015-12-03 ENCOUNTER — Ambulatory Visit: Payer: Managed Care, Other (non HMO)

## 2015-12-07 ENCOUNTER — Ambulatory Visit: Payer: Managed Care, Other (non HMO)

## 2015-12-09 ENCOUNTER — Ambulatory Visit: Payer: Managed Care, Other (non HMO)

## 2015-12-10 ENCOUNTER — Ambulatory Visit: Payer: Managed Care, Other (non HMO)

## 2015-12-14 ENCOUNTER — Ambulatory Visit: Payer: Managed Care, Other (non HMO)

## 2015-12-16 ENCOUNTER — Ambulatory Visit: Payer: Managed Care, Other (non HMO)

## 2015-12-17 ENCOUNTER — Ambulatory Visit: Payer: Managed Care, Other (non HMO)

## 2015-12-21 ENCOUNTER — Ambulatory Visit: Payer: Managed Care, Other (non HMO)

## 2015-12-23 ENCOUNTER — Ambulatory Visit: Payer: Managed Care, Other (non HMO)

## 2015-12-24 ENCOUNTER — Ambulatory Visit: Payer: Managed Care, Other (non HMO)

## 2015-12-28 ENCOUNTER — Ambulatory Visit: Payer: Managed Care, Other (non HMO)

## 2015-12-30 ENCOUNTER — Ambulatory Visit: Payer: Managed Care, Other (non HMO)

## 2015-12-31 ENCOUNTER — Ambulatory Visit: Payer: Managed Care, Other (non HMO)

## 2016-01-04 ENCOUNTER — Ambulatory Visit: Payer: Managed Care, Other (non HMO)

## 2016-01-06 ENCOUNTER — Ambulatory Visit: Payer: Managed Care, Other (non HMO)

## 2016-01-07 ENCOUNTER — Ambulatory Visit: Payer: Managed Care, Other (non HMO)

## 2016-02-18 ENCOUNTER — Emergency Department (HOSPITAL_COMMUNITY): Payer: Managed Care, Other (non HMO)

## 2016-02-18 ENCOUNTER — Emergency Department (HOSPITAL_COMMUNITY)
Admission: EM | Admit: 2016-02-18 | Discharge: 2016-02-19 | Disposition: A | Discharge status: Home / Self Care | Payer: Managed Care, Other (non HMO) | Attending: Emergency Medicine | Admitting: Emergency Medicine

## 2016-02-18 ENCOUNTER — Encounter (HOSPITAL_COMMUNITY): Payer: Self-pay | Admitting: Emergency Medicine

## 2016-02-18 DIAGNOSIS — R062 Wheezing: Secondary | ICD-10-CM | POA: Diagnosis present

## 2016-02-18 DIAGNOSIS — J209 Acute bronchitis, unspecified: Secondary | ICD-10-CM | POA: Diagnosis not present

## 2016-02-18 DIAGNOSIS — Z7982 Long term (current) use of aspirin: Secondary | ICD-10-CM | POA: Insufficient documentation

## 2016-02-18 DIAGNOSIS — K219 Gastro-esophageal reflux disease without esophagitis: Secondary | ICD-10-CM | POA: Diagnosis not present

## 2016-02-18 DIAGNOSIS — E7800 Pure hypercholesterolemia, unspecified: Secondary | ICD-10-CM | POA: Diagnosis not present

## 2016-02-18 DIAGNOSIS — Z7901 Long term (current) use of anticoagulants: Secondary | ICD-10-CM | POA: Diagnosis not present

## 2016-02-18 DIAGNOSIS — F419 Anxiety disorder, unspecified: Secondary | ICD-10-CM | POA: Insufficient documentation

## 2016-02-18 DIAGNOSIS — E876 Hypokalemia: Secondary | ICD-10-CM | POA: Insufficient documentation

## 2016-02-18 DIAGNOSIS — K589 Irritable bowel syndrome without diarrhea: Secondary | ICD-10-CM | POA: Insufficient documentation

## 2016-02-18 DIAGNOSIS — I10 Essential (primary) hypertension: Secondary | ICD-10-CM | POA: Diagnosis not present

## 2016-02-18 DIAGNOSIS — M1990 Unspecified osteoarthritis, unspecified site: Secondary | ICD-10-CM | POA: Diagnosis not present

## 2016-02-18 DIAGNOSIS — F17200 Nicotine dependence, unspecified, uncomplicated: Secondary | ICD-10-CM | POA: Diagnosis not present

## 2016-02-18 LAB — CBC WITH DIFFERENTIAL/PLATELET
BASOS ABS: 0 10*3/uL (ref 0.0–0.1)
Basophils Relative: 0 %
EOS PCT: 1 %
Eosinophils Absolute: 0.2 10*3/uL (ref 0.0–0.7)
HCT: 37.3 % (ref 36.0–46.0)
Hemoglobin: 11.5 g/dL — ABNORMAL LOW (ref 12.0–15.0)
LYMPHS PCT: 7 %
Lymphs Abs: 0.8 10*3/uL (ref 0.7–4.0)
MCH: 26.1 pg (ref 26.0–34.0)
MCHC: 30.8 g/dL (ref 30.0–36.0)
MCV: 84.8 fL (ref 78.0–100.0)
MONO ABS: 0.8 10*3/uL (ref 0.1–1.0)
MONOS PCT: 7 %
Neutro Abs: 9.6 10*3/uL — ABNORMAL HIGH (ref 1.7–7.7)
Neutrophils Relative %: 85 %
PLATELETS: 350 10*3/uL (ref 150–400)
RBC: 4.4 MIL/uL (ref 3.87–5.11)
RDW: 17.2 % — AB (ref 11.5–15.5)
WBC: 11.3 10*3/uL — ABNORMAL HIGH (ref 4.0–10.5)

## 2016-02-18 LAB — BASIC METABOLIC PANEL
Anion gap: 7 (ref 5–15)
BUN: 10 mg/dL (ref 6–20)
CALCIUM: 8.7 mg/dL — AB (ref 8.9–10.3)
CO2: 23 mmol/L (ref 22–32)
Chloride: 104 mmol/L (ref 101–111)
Creatinine, Ser: 0.99 mg/dL (ref 0.44–1.00)
GFR calc Af Amer: >60 mL/min (ref >60)
GLUCOSE: 111 mg/dL — AB (ref 65–99)
Potassium: 4 mmol/L (ref 3.5–5.1)
Sodium: 134 mmol/L — ABNORMAL LOW (ref 135–145)

## 2016-02-18 LAB — I-STAT TROPONIN, ED: Troponin i, poc: 0.01 ng/mL (ref 0.00–0.08)

## 2016-02-18 MED ORDER — SODIUM CHLORIDE 0.9 % IV BOLUS (SEPSIS)
1000.0000 mL | Freq: Once | INTRAVENOUS | Status: AC
  Administered 2016-02-18: 1000 mL via INTRAVENOUS

## 2016-02-18 MED ORDER — IPRATROPIUM-ALBUTEROL 0.5-2.5 (3) MG/3ML IN SOLN
3.0000 mL | Freq: Once | RESPIRATORY_TRACT | Status: AC
  Administered 2016-02-18: 3 mL via RESPIRATORY_TRACT
  Filled 2016-02-18: qty 3

## 2016-02-18 MED ORDER — ALBUTEROL SULFATE (2.5 MG/3ML) 0.083% IN NEBU
5.0000 mg | INHALATION_SOLUTION | Freq: Once | RESPIRATORY_TRACT | Status: AC
  Administered 2016-02-18: 5 mg via RESPIRATORY_TRACT

## 2016-02-18 MED ORDER — ACETAMINOPHEN 325 MG PO TABS
ORAL_TABLET | ORAL | Status: AC
  Filled 2016-02-18: qty 2

## 2016-02-18 MED ORDER — ALBUTEROL SULFATE (2.5 MG/3ML) 0.083% IN NEBU
INHALATION_SOLUTION | RESPIRATORY_TRACT | Status: AC
  Filled 2016-02-18: qty 6

## 2016-02-18 MED ORDER — ALBUTEROL SULFATE (2.5 MG/3ML) 0.083% IN NEBU
5.0000 mg | INHALATION_SOLUTION | Freq: Once | RESPIRATORY_TRACT | Status: AC
  Administered 2016-02-18: 5 mg via RESPIRATORY_TRACT
  Filled 2016-02-18: qty 6

## 2016-02-18 MED ORDER — PREDNISONE 20 MG PO TABS
60.0000 mg | ORAL_TABLET | Freq: Once | ORAL | Status: AC
  Administered 2016-02-18: 60 mg via ORAL
  Filled 2016-02-18: qty 3

## 2016-02-18 MED ORDER — ACETAMINOPHEN 325 MG PO TABS
650.0000 mg | ORAL_TABLET | Freq: Once | ORAL | Status: AC
  Administered 2016-02-18: 650 mg via ORAL

## 2016-02-18 NOTE — Progress Notes (Signed)
Peak Flow 150. Pt attempt x2 with good effort. Pt instructed on how to perform this at home.

## 2016-02-18 NOTE — ED Notes (Signed)
Pt. reports wheezing , productive cough and chest congestion onset last week , prescribed with Prednisone by her PCP last week with no improvement diagnosed with bronchitis.

## 2016-02-18 NOTE — ED Provider Notes (Signed)
CSN: [id number]     Arrival date & time 02/18/16  2018 History   First MD Initiated Contact with Patient 02/18/16 2214     Chief Complaint  Patient presents with  . Wheezing  . Cough     (Consider location/radiation/quality/duration/timing/severity/associated sxs/prior Treatment) HPI  45 year old female presents with shortness of breath, wheezing, and cough. Afebrile currently. Patient states she had bronchitis and was given steroids last week. Cough and wheezing went away. All of her symptoms returned about 1-2 days ago. The chest pain or chest tightness. Patient hears herself wheezing. Albuterol nebulizer helps transiently but then the symptoms come right back. Went to urgent care and had negative influenza swabs. Was given a breathing treatment in the waiting room and feels much better.  Past Medical History  Diagnosis Date  . Hypertension   . High cholesterol   . Anxiety   . IBS (irritable bowel syndrome)   . Acid reflux   . Sciatic pain   . Arthritis   . High cholesterol   . Hypokalemia    Past Surgical History  Procedure Laterality Date  . Abdominal hysterectomy    . Cesarean section    . Hernia repair    . Cardiac valve surgery     No family history on file. Social History  Substance Use Topics  . Smoking status: Current Every Day Smoker  . Smokeless tobacco: None  . Alcohol Use: No   OB History    No data available     Review of Systems  Constitutional: Positive for fever.  HENT: Negative for congestion.   Respiratory: Positive for cough, shortness of breath and wheezing.   Cardiovascular: Negative for chest pain and leg swelling.  All other systems reviewed and are negative.     Allergies  Ivp dye; Hctz; Povidone iodine; Sulfa antibiotics; Lisinopril; and Mucinex  Home Medications   Prior to Admission medications   Medication Sig Start Date End Date Taking? Authorizing Provider  albuterol (PROAIR HFA) 108 (90 BASE) MCG/ACT inhaler Inhale into the  lungs. 08/06/14   Historical Provider, MD  amoxicillin (AMOXIL) 500 MG capsule Take 2,000 mg by mouth as needed (for dental procedure).  01/20/15   Historical Provider, MD  aspirin 81 MG chewable tablet Chew by mouth. 04/12/15 04/11/16  Historical Provider, MD  Biotin w/ Vitamins C & E 1250-7.5-7.5 MCG-MG-UNT CHEW Chew 2 tablets by mouth daily.    Historical Provider, MD  buPROPion (WELLBUTRIN) 100 MG tablet Take by mouth.    Historical Provider, MD  carvedilol (COREG) 3.125 MG tablet Take by mouth. 06/22/15 06/21/16  Historical Provider, MD  cyclobenzaprine (FLEXERIL) 5 MG tablet Take 5 mg by mouth at bedtime.  01/13/15   Historical Provider, MD  diazepam (VALIUM) 5 MG tablet Take by mouth.    Historical Provider, MD  fluticasone (FLONASE) 50 MCG/ACT nasal spray Place into the nose. 07/21/15 07/20/16  Historical Provider, MD  furosemide (LASIX) 40 MG tablet Take by mouth. 07/03/15 10/01/15  Historical Provider, MD  gabapentin (NEURONTIN) 300 MG capsule Take by mouth. 07/24/15 07/23/16  Historical Provider, MD  hydrALAZINE (APRESOLINE) 25 MG tablet Take by mouth. 05/30/15   Historical Provider, MD  HYDROcodone-acetaminophen (NORCO/VICODIN) 5-325 MG per tablet Take 1 tablet by mouth 2 (two) times daily as needed for moderate pain.    Historical Provider, MD  Linaclotide Rolan Lipa) 290 MCG CAPS capsule Take by mouth. 05/04/15   Historical Provider, MD  loratadine (CLARITIN) 10 MG tablet Take by mouth. 07/21/15 07/20/16  Historical  Provider, MD  meclizine (ANTIVERT) 25 MG tablet Take by mouth. 05/11/15   Historical Provider, MD  meloxicam (MOBIC) 15 MG tablet Take 15 mg by mouth daily as needed for pain.    Historical Provider, MD  omeprazole (PRILOSEC) 20 MG capsule Take 1 capsule (20 mg total) by mouth daily. 08/25/15   Lucilla Lame, MD  oxyCODONE-acetaminophen (ROXICET) 5-325 MG per tablet Take 1 tablet by mouth every 6 (six) hours as needed. 05/29/15 05/28/16  Lavonia Drafts, MD  potassium chloride (MICRO-K) 10 MEQ CR capsule  Take by mouth. 07/10/15 10/08/15  Historical Provider, MD  pravastatin (PRAVACHOL) 20 MG tablet Take 20 mg by mouth daily.  12/07/14   Historical Provider, MD  verapamil (CALAN-SR) 120 MG CR tablet Take by mouth. 06/15/15   Historical Provider, MD  warfarin (COUMADIN) 7.5 MG tablet Take by mouth. 07/10/15   Historical Provider, MD   BP 123/81 mmHg  Pulse 98  Temp(Src) 103.2 F (39.6 C) (Oral)  Resp 18  Ht 5' 2"  (1.575 m)  Wt 236 lb (107.049 kg)  BMI 43.15 kg/m2  SpO2 98% Physical Exam  Constitutional: [name] oriented to person, place, and time. She appears well-developed and well-nourished.  HENT:  Head: Normocephalic and atraumatic.  Right Ear: External ear normal.  Left Ear: External ear normal.  Nose: Nose normal.  Eyes: Right eye exhibits no discharge. Left eye exhibits no discharge.  Cardiovascular: Normal rate, regular rhythm and normal heart sounds.   Pulmonary/Chest: Effort normal. She has wheezes (end expiratory).  Speaks in full sentences, no respiratory distress  Abdominal: Soft. There is no tenderness.  Neurological: [name] alert and oriented to person, place, and time.  Skin: Skin is warm and dry.  Nursing note and vitals reviewed.   ED Course  Procedures (including critical care time) Labs Review Labs Reviewed  BASIC METABOLIC PANEL - Abnormal; Notable for the following:    Sodium 134 (*)    Glucose, Bld 111 (*)    Calcium 8.7 (*)    All other components within normal limits  CBC WITH DIFFERENTIAL/PLATELET - Abnormal; Notable for the following:    WBC 11.3 (*)    Hemoglobin 11.5 (*)    RDW 17.2 (*)    Neutro Abs 9.6 (*)    All other components within normal limits  I-STAT TROPOININ, ED    Imaging Review Dg Chest 2 View  02/18/2016  CLINICAL DATA:  45 year old female with shortness of breath, cough, and EXAM: CHEST  2 VIEW COMPARISON:  Radiograph dated 03/06/2015 FINDINGS: Two views of the chest demonstrate minimal bibasilar atelectatic changes. There is no  focal consolidation, pleural effusion, or pneumothorax. Stable mild cardiomegaly. Median sternotomy wires, and aortic valve replacement noted. No acute osseous pathology. IMPRESSION: No active cardiopulmonary disease. Electronically Signed   By: Anner Crete M.D.   On: 02/18/2016 21:37   I have personally reviewed and evaluated these images and lab results as part of my medical decision-making.   EKG Interpretation   Date/Time:  Thursday February 18 2016 21:00:36 EDT Ventricular Rate:  98 PR Interval:  138 QRS Duration: 84 QT Interval:  330 QTC Calculation: 421 R Axis:   71 Text Interpretation:  Normal sinus rhythm T wave abnormality, consider  lateral ischemia Abnormal ECG changes noted compared to July 2016, but  similar to April 2016 Confirmed by Regenia Skeeter  MD, [place] [phone]) on 02/18/2016  9:59:19 PM      MDM   Final diagnoses:  Acute bronchitis, unspecified organism  Patient feels like her shortness of breath and wheezing have completely resolved after albuterol treatments. Is given prednisone. She has been off steroids for the last 5 days, I think she needs another steroid burst given acute bronchospasm. No signs of CHF. She does have a nonspecific T wave abnormality but this has been present on prior ECGs. I doubt acute ischemia given note chest pain or tightness. She ambulated around the emergency department with normal oxygen saturations and no increased work of breathing. Sats have remained well. Given fever but no signs of sepsis, I will cover with antibiotics for bronchitis. Will do Augmentin instead of azithromycin given that she is on Coumadin.    Sherwood Gambler, MD 02/19/16 [phone]

## 2016-02-19 MED ORDER — PREDNISONE 20 MG PO TABS: 40.0000 mg | ORAL_TABLET | Freq: Every day | ORAL | Status: DC

## 2016-02-19 MED ORDER — AMOXICILLIN-POT CLAVULANATE 875-125 MG PO TABS: 1.0000 | ORAL_TABLET | Freq: Two times a day (BID) | ORAL | Status: DC

## 2016-02-19 NOTE — Discharge Instructions (Signed)
Use your albuterol nebulizer every 4 hours for the next 48 hours. If you need it significantly more than this or have worsening shortness of breath, return to the ER for immediate evaluation. Otherwise follow closely with your primary care physician.

## 2016-02-19 NOTE — ED Notes (Signed)
Ambulated pt with pulse ox, % started at 95% and fluctuated between 95%-97% until returning to pts room.

## 2016-05-16 ENCOUNTER — Other Ambulatory Visit: Payer: Self-pay | Admitting: Internal Medicine

## 2016-05-16 DIAGNOSIS — Z1231 Encounter for screening mammogram for malignant neoplasm of breast: Secondary | ICD-10-CM

## 2016-05-17 DIAGNOSIS — E119 Type 2 diabetes mellitus without complications: Secondary | ICD-10-CM | POA: Insufficient documentation

## 2016-05-17 DIAGNOSIS — E559 Vitamin D deficiency, unspecified: Secondary | ICD-10-CM | POA: Insufficient documentation

## 2016-05-27 ENCOUNTER — Ambulatory Visit
Admission: RE | Admit: 2016-05-27 | Discharge: 2016-05-27 | Disposition: A | Discharge status: Home / Self Care | Payer: Managed Care, Other (non HMO) | Source: Ambulatory Visit | Attending: Internal Medicine | Admitting: Internal Medicine

## 2016-05-27 ENCOUNTER — Other Ambulatory Visit: Payer: Self-pay | Admitting: Internal Medicine

## 2016-05-27 DIAGNOSIS — Z1231 Encounter for screening mammogram for malignant neoplasm of breast: Secondary | ICD-10-CM | POA: Insufficient documentation

## 2016-08-08 DIAGNOSIS — M4726 Other spondylosis with radiculopathy, lumbar region: Secondary | ICD-10-CM | POA: Insufficient documentation

## 2016-08-23 DIAGNOSIS — R600 Localized edema: Secondary | ICD-10-CM | POA: Insufficient documentation

## 2016-09-07 DIAGNOSIS — I10 Essential (primary) hypertension: Secondary | ICD-10-CM | POA: Insufficient documentation

## 2016-09-07 DIAGNOSIS — E119 Type 2 diabetes mellitus without complications: Secondary | ICD-10-CM | POA: Insufficient documentation

## 2016-09-07 DIAGNOSIS — Z7901 Long term (current) use of anticoagulants: Secondary | ICD-10-CM | POA: Insufficient documentation

## 2016-09-07 DIAGNOSIS — G4733 Obstructive sleep apnea (adult) (pediatric): Secondary | ICD-10-CM | POA: Insufficient documentation

## 2016-09-07 DIAGNOSIS — E7800 Pure hypercholesterolemia, unspecified: Secondary | ICD-10-CM | POA: Insufficient documentation

## 2016-09-07 DIAGNOSIS — I428 Other cardiomyopathies: Secondary | ICD-10-CM | POA: Insufficient documentation

## 2016-09-07 DIAGNOSIS — K2100 Gastro-esophageal reflux disease with esophagitis, without bleeding: Secondary | ICD-10-CM | POA: Insufficient documentation

## 2016-09-07 DIAGNOSIS — J45909 Unspecified asthma, uncomplicated: Secondary | ICD-10-CM | POA: Insufficient documentation

## 2016-09-09 ENCOUNTER — Other Ambulatory Visit: Payer: Self-pay | Admitting: Bariatrics

## 2016-09-19 ENCOUNTER — Other Ambulatory Visit: Payer: Self-pay

## 2016-09-19 DIAGNOSIS — Z720 Tobacco use: Secondary | ICD-10-CM | POA: Insufficient documentation

## 2016-09-20 ENCOUNTER — Encounter: Payer: Self-pay | Admitting: Gastroenterology

## 2016-09-20 ENCOUNTER — Ambulatory Visit (INDEPENDENT_AMBULATORY_CARE_PROVIDER_SITE_OTHER): Payer: Managed Care, Other (non HMO) | Admitting: Gastroenterology

## 2016-09-20 VITALS — BP 172/89 | HR 57 | Temp 98.3°F | Ht 62.0 in | Wt 236.0 lb

## 2016-09-20 DIAGNOSIS — K219 Gastro-esophageal reflux disease without esophagitis: Secondary | ICD-10-CM | POA: Diagnosis not present

## 2016-09-20 NOTE — Progress Notes (Signed)
Gastroenterology Consultation  Referring Provider:     Glendon Axe, MD Primary Care Physician:  Glendon Axe, MD,Dr Tyner Primary Gastroenterologist:  Dr. Jonathon Bellows  Reason for Consultation:     EGD for  GERD        HPI:   Shelly Huynh is a 45 y.o. y/o female referred for consultation & management  by Dr. Glendon Axe, MD.     She has been referredfor an EGD prior to gastric bypass (gastric sleeve)with a history of GERD. S/p heart valve replacement x 3  with mechanical valve for rheumatic heart disease . [name] on anticoagulation. 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 Hba1c 04/2016 6.5. Hb 12.0 in 04/2016.  Reflux:  Onset : diagnosed in 2015  Symptoms: her only symptoms was dysphagia and relived with prilosec , changed to Protonix after  Recent weight gain: stable  Dinner time : varies according to her work   Prior EGD: none  Family history of esophageal cancer:none    Past Medical History:  Diagnosis Date  . Acid reflux   . Anxiety   . Arthritis   . High cholesterol   . High cholesterol   . Hypertension   . Hypokalemia   . IBS (irritable bowel syndrome)   . Sciatic pain     Past Surgical History:  Procedure Laterality Date  . ABDOMINAL HYSTERECTOMY    . BREAST EXCISIONAL BIOPSY Right 1992   NEG  . CARDIAC VALVE SURGERY    . CESAREAN SECTION    . HERNIA REPAIR      Prior to Admission medications   Medication Sig Start Date End Date Taking? Authorizing Provider  albuterol (PROAIR HFA) 108 (90 BASE) MCG/ACT inhaler Inhale into the lungs. 08/06/14   Historical Provider, MD  amoxicillin (AMOXIL) 500 MG capsule Take 2,000 mg by mouth as needed (for dental procedure).  01/20/15   Historical Provider, MD  amoxicillin-clavulanate (AUGMENTIN) 875-125 MG tablet Take 1 tablet by mouth 2 (two) times daily. One po bid x 7 days 02/19/16   Sherwood Gambler, MD  Biotin w/ Vitamins C & E 1250-7.5-7.5 MCG-MG-UNT CHEW Chew 2 tablets by mouth daily.    Historical  Provider, MD  Blood Glucose Monitoring Suppl (FIFTY50 GLUCOSE METER 2.0) w/Device KIT Use as directed. DX: E11.9 One touch Verio 06/01/16 06/01/17  Historical Provider, MD  buPROPion (WELLBUTRIN) 100 MG tablet Take by mouth.    Historical Provider, MD  carvedilol (COREG) 3.125 MG tablet Take by mouth. 06/22/15 06/21/16  Historical Provider, MD  cyclobenzaprine (FLEXERIL) 5 MG tablet Take 5 mg by mouth at bedtime.  01/13/15   Historical Provider, MD  diazepam (VALIUM) 5 MG tablet Take by mouth.    Historical Provider, MD  fluticasone (FLONASE) 50 MCG/ACT nasal spray Place into the nose. 07/21/15 07/20/16  Historical Provider, MD  furosemide (LASIX) 40 MG tablet Take by mouth. 07/03/15 10/01/15  Historical Provider, MD  gabapentin (NEURONTIN) 300 MG capsule Take by mouth. 07/24/15 07/23/16  Historical Provider, MD  hydrALAZINE (APRESOLINE) 25 MG tablet Take by mouth. 05/30/15   Historical Provider, MD  HYDROcodone-acetaminophen (NORCO/VICODIN) 5-325 MG per tablet Take 1 tablet by mouth 2 (two) times daily as needed for moderate pain.    Historical Provider, MD  levalbuterol Penne Lash) 1.25 MG/3ML nebulizer solution Inhale into the lungs. 02/08/16 02/07/17  Historical Provider, MD  Linaclotide Rolan Lipa) 290 MCG CAPS capsule Take by mouth. 05/04/15   Historical Provider, MD  loratadine (CLARITIN) 10 MG tablet Take by mouth.  07/21/15 07/20/16  Historical Provider, MD  meclizine (ANTIVERT) 25 MG tablet Take by mouth. 05/11/15   Historical Provider, MD  meloxicam (MOBIC) 15 MG tablet Take 15 mg by mouth daily as needed for pain.    Historical Provider, MD  omeprazole (PRILOSEC) 20 MG capsule Take 1 capsule (20 mg total) by mouth daily. 08/25/15   Lucilla Lame, MD  pantoprazole (PROTONIX) 40 MG tablet Take 1 tablet by mouth two  times daily 06/30/16   Historical Provider, MD  potassium chloride (MICRO-K) 10 MEQ CR capsule Take by mouth. 07/10/15 10/08/15  Historical Provider, MD  pravastatin (PRAVACHOL) 20 MG tablet Take 20 mg by  mouth daily.  12/07/14   Historical Provider, MD  predniSONE (DELTASONE) 20 MG tablet Take 2 tablets (40 mg total) by mouth daily. 02/19/16   Sherwood Gambler, MD  traMADol (ULTRAM) 50 MG tablet Take by mouth. 08/09/16   Historical Provider, MD  verapamil (CALAN-SR) 120 MG CR tablet Take by mouth. 06/15/15   Historical Provider, MD  warfarin (COUMADIN) 7.5 MG tablet Take by mouth. 07/10/15   Historical Provider, MD    Family History  Problem Relation Age of Onset  . Breast cancer Paternal Grandmother      Social History  Substance Use Topics  . Smoking status: Current Every Day Smoker  . Smokeless tobacco: Not on file  . Alcohol use No    Allergies as of 09/20/2016 - Review Complete 02/18/2016  Allergen Reaction Noted  . Ivp dye [iodinated diagnostic agents] Anaphylaxis and Hives 06/14/2014  . Sulfa antibiotics Hives and Anaphylaxis 04/06/2013  . Guaifenesin Hives 04/18/2014  . Hctz [hydrochlorothiazide] Hives 06/14/2014  . Losartan Itching 01/15/2015  . Povidone iodine Hives 07/24/2015  . Povidone-iodine Hives 04/15/2015  . Lisinopril Hives and Rash 04/18/2014  . Mucinex [guaifenesin er] Hives and Rash 04/06/2013    Review of Systems:    All systems reviewed and negative except where noted in HPI.   Physical Exam:  There were no vitals taken for this visit. No LMP recorded. Patient has had a hysterectomy. Psych:  Alert and cooperative. Normal mood and affect. General:   Alert,  Well-developed, well-nourished, pleasant and cooperative in NAD Head:  Normocephalic and atraumatic. Eyes:  Sclera clear, no icterus.   Conjunctiva pink. Ears:  Normal auditory acuity. Nose:  No deformity, discharge, or lesions. Mouth:  No deformity or lesions,oropharynx pink & moist. Neck:  Supple; no masses or thyromegaly. Lungs:  Respirations even and unlabored.  Clear throughout to auscultation.   No wheezes, crackles, or rhonchi. No acute distress. Heart:  Regular rate and rhythm; click of  mechanical heart valve audible Abdomen:  Normal bowel sounds.  No bruits.  Soft, non-tender and non-distended without masses, hepatosplenomegaly or hernias noted.  No guarding or rebound tenderness.    Msk:  Symmetrical without gross deformities. Good, equal movement & strength bilaterally. Pulses:  Normal pulses noted. Extremities:  No clubbing or edema.  No cyanosis. Neurologic:  Alert and oriented x3;  grossly normal neurologically. Skin:  Intact without significant lesions or rashes. No jaundice. Lymph Nodes:  No significant cervical adenopathy. Psych:  Alert and cooperative. Normal mood and affect.  Imaging Studies: No results found.  Assessment and Plan:   Yicel Shannon is a 45 y.o. y/o female has been referred for EGD for GERD prior to bariatric surgery.   1. GERD:  EGD after cardiac clearance, will need bridging of Coumadin for the procedure. 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 [name] willing to accept  blood and blood products if it is required to save her life. Counseled on life style changes, suggest to use PPI first thing in the morning on empty stomach and eat 30 minutes after. Advised on the use of a wedge pillow at night , avoid meals for 2 hours prior to bed time.Discussed the risks and benefits of long term PPI use including but not limited to bone loss, chronic kidney disease, infections , low magnesium . Aim to use at the lowest dose for the shortest period of time   Discussed the rationale for an endoscopy  and decided to proceed.   Follow up in 6 months     I have discussed risks & benefits of the procedure  which include, but are not limited to, bleeding, infection, perforation,respiratory compromise & drug reaction.  The patient agrees with this plan & written consent will be obtained.     Follow up in as needed  Dr Jonathon Bellows MD

## 2016-09-20 NOTE — Patient Instructions (Addendum)
I have scheduled your upper endoscopy for 10/07/2016. I am waiting for medication clearance from your cardiologist.   As soon as I receive the clearance, I will give you a call to let you know when to stop your blood thinner and when to resume.   Please call our office around 01/2017 to make an appointment for 02/2017 for your 6 months follow up  Please call our office if you have any questions or concerns.    Gastroesophageal Reflux Disease, Adult Normally, food travels down the esophagus and stays in the stomach to be digested. However, when a person has gastroesophageal reflux disease (GERD), food and stomach acid move back up into the esophagus. When this happens, the esophagus becomes sore and inflamed. Over time, GERD can create small holes (ulcers) in the lining of the esophagus.  CAUSES This condition is caused by a problem with the muscle between the esophagus and the stomach (lower esophageal sphincter, or LES). Normally, the LES muscle closes after food passes through the esophagus to the stomach. When the LES is weakened or abnormal, it does not close properly, and that allows food and stomach acid to go back up into the esophagus. The LES can be weakened by certain dietary substances, medicines, and medical conditions, including:  Tobacco use.  Pregnancy.  Having a hiatal hernia.  Heavy alcohol use.  Certain foods and beverages, such as coffee, chocolate, onions, and peppermint. RISK FACTORS This condition is more likely to develop in:  People who have an increased body weight.  People who have connective tissue disorders.  People who use NSAID medicines. SYMPTOMS Symptoms of this condition include:  Heartburn.  Difficult or painful swallowing.  The feeling of having a lump in the throat.  Abitter taste in the mouth.  Bad breath.  Having a large amount of saliva.  Having an upset or bloated stomach.  Belching.  Chest pain.  Shortness of breath or  wheezing.  Ongoing (chronic) cough or a night-time cough.  Wearing away of tooth enamel.  Weight loss. Different conditions can cause chest pain. Make sure to see your health care provider if you experience chest pain. DIAGNOSIS Your health care provider will take a medical history and perform a physical exam. To determine if you have mild or severe GERD, your health care provider may also monitor how you respond to treatment. You may also have other tests, including:  An endoscopy toexamine your stomach and esophagus with a small camera.  A test thatmeasures the acidity level in your esophagus.  A test thatmeasures how much pressure is on your esophagus.  A barium swallow or modified barium swallow to show the shape, size, and functioning of your esophagus. TREATMENT The goal of treatment is to help relieve your symptoms and to prevent complications. Treatment for this condition may vary depending on how severe your symptoms are. Your health care provider may recommend:  Changes to your diet.  Medicine.  Surgery. HOME CARE INSTRUCTIONS Diet  Follow a diet as recommended by your health care provider. This may involve avoiding foods and drinks such as:  Coffee and tea (with or without caffeine).  Drinks that containalcohol.  Energy drinks and sports drinks.  Carbonated drinks or sodas.  Chocolate and cocoa.  Peppermint and mint flavorings.  Garlic and onions.  Horseradish.  Spicy and acidic foods, including peppers, chili powder, curry powder, vinegar, hot sauces, and barbecue sauce.  Citrus fruit juices and citrus fruits, such as oranges, lemons, and limes.  Tomato-based foods, such as red sauce, chili, salsa, and pizza with red sauce.  Fried and fatty foods, such as donuts, french fries, potato chips, and high-fat dressings.  High-fat meats, such as hot dogs and fatty cuts of red and white meats, such as rib eye steak, sausage, ham, and bacon.  High-fat  dairy items, such as whole milk, butter, and cream cheese.  Eat small, frequent meals instead of large meals.  Avoid drinking large amounts of liquid with your meals.  Avoid eating meals during the 2-3 hours before bedtime.  Avoid lying down right after you eat.  Do not exercise right after you eat. General Instructions  Pay attention to any changes in your symptoms.  Take over-the-counter and prescription medicines only as told by your health care provider. Do not take aspirin, ibuprofen, or other NSAIDs unless your health care provider told you to do so.  Do not use any tobacco products, including cigarettes, chewing tobacco, and e-cigarettes. If you need help quitting, ask your health care provider.  Wear loose-fitting clothing. Do not wear anything tight around your waist that causes pressure on your abdomen.  Raise (elevate) the head of your bed 6 inches (15cm).  Try to reduce your stress, such as with yoga or meditation. If you need help reducing stress, ask your health care provider.  If you are overweight, reduce your weight to an amount that is healthy for you. Ask your health care provider for guidance about a safe weight loss goal.  Keep all follow-up visits as told by your health care provider. This is important. SEEK MEDICAL CARE IF:  You have new symptoms.  You have unexplained weight loss.  You have difficulty swallowing, or it hurts to swallow.  You have wheezing or a persistent cough.  Your symptoms do not improve with treatment.  You have a hoarse voice. SEEK IMMEDIATE MEDICAL CARE IF:  You have pain in your arms, neck, jaw, teeth, or back.  You feel sweaty, dizzy, or light-headed.  You have chest pain or shortness of breath.  You vomit and your vomit looks like blood or coffee grounds.  You faint.  Your stool is bloody or black.  You cannot swallow, drink, or eat.   This information is not intended to replace advice given to you by your  health care provider. Make sure you discuss any questions you have with your health care provider.   Document Released: 08/17/2005 Document Revised: 07/29/2015 Document Reviewed: 03/04/2015 Elsevier Interactive Patient Education [employer].

## 2016-09-20 NOTE — Addendum Note (Signed)
Addended by: Gavin Pound on: 09/20/2016 04:39 PM   Modules accepted: Orders, SmartSet

## 2016-09-23 ENCOUNTER — Telehealth: Payer: Self-pay

## 2016-09-23 NOTE — Telephone Encounter (Signed)
I received the medication clearance but it has been denied. I called Dr. Alveria Apley nurse, the patient has been started with Bridging and is all set for her colonoscopy on 10/07/16

## 2016-09-27 ENCOUNTER — Ambulatory Visit
Admission: RE | Admit: 2016-09-27 | Discharge: 2016-09-27 | Disposition: A | Discharge status: Home / Self Care | Payer: Managed Care, Other (non HMO) | Source: Ambulatory Visit | Attending: Bariatrics | Admitting: Bariatrics

## 2016-09-27 DIAGNOSIS — I509 Heart failure, unspecified: Secondary | ICD-10-CM | POA: Insufficient documentation

## 2016-09-27 DIAGNOSIS — F17200 Nicotine dependence, unspecified, uncomplicated: Secondary | ICD-10-CM | POA: Diagnosis not present

## 2016-09-27 DIAGNOSIS — R935 Abnormal findings on diagnostic imaging of other abdominal regions, including retroperitoneum: Secondary | ICD-10-CM | POA: Diagnosis not present

## 2016-09-27 DIAGNOSIS — J45909 Unspecified asthma, uncomplicated: Secondary | ICD-10-CM | POA: Diagnosis not present

## 2016-09-27 DIAGNOSIS — R918 Other nonspecific abnormal finding of lung field: Secondary | ICD-10-CM | POA: Diagnosis not present

## 2016-10-06 ENCOUNTER — Encounter: Payer: Self-pay | Admitting: *Deleted

## 2016-10-07 ENCOUNTER — Ambulatory Visit: Payer: Managed Care, Other (non HMO) | Admitting: Anesthesiology

## 2016-10-07 ENCOUNTER — Encounter
Admission: RE | Disposition: A | Discharge status: Home / Self Care | Payer: Self-pay | Source: Ambulatory Visit | Attending: Gastroenterology

## 2016-10-07 ENCOUNTER — Encounter: Payer: Self-pay | Admitting: *Deleted

## 2016-10-07 ENCOUNTER — Ambulatory Visit
Admission: RE | Admit: 2016-10-07 | Discharge: 2016-10-07 | Disposition: A | Discharge status: Home / Self Care | Payer: Managed Care, Other (non HMO) | Source: Ambulatory Visit | Attending: Gastroenterology | Admitting: Gastroenterology

## 2016-10-07 DIAGNOSIS — Z6841 Body Mass Index (BMI) 40.0 and over, adult: Secondary | ICD-10-CM | POA: Insufficient documentation

## 2016-10-07 DIAGNOSIS — Z7952 Long term (current) use of systemic steroids: Secondary | ICD-10-CM | POA: Diagnosis not present

## 2016-10-07 DIAGNOSIS — I509 Heart failure, unspecified: Secondary | ICD-10-CM | POA: Insufficient documentation

## 2016-10-07 DIAGNOSIS — I110 Hypertensive heart disease with heart failure: Secondary | ICD-10-CM | POA: Insufficient documentation

## 2016-10-07 DIAGNOSIS — Z952 Presence of prosthetic heart valve: Secondary | ICD-10-CM | POA: Insufficient documentation

## 2016-10-07 DIAGNOSIS — Z87891 Personal history of nicotine dependence: Secondary | ICD-10-CM | POA: Diagnosis not present

## 2016-10-07 DIAGNOSIS — K2900 Acute gastritis without bleeding: Secondary | ICD-10-CM

## 2016-10-07 DIAGNOSIS — I2510 Atherosclerotic heart disease of native coronary artery without angina pectoris: Secondary | ICD-10-CM | POA: Insufficient documentation

## 2016-10-07 DIAGNOSIS — Z01818 Encounter for other preprocedural examination: Secondary | ICD-10-CM | POA: Diagnosis not present

## 2016-10-07 DIAGNOSIS — E119 Type 2 diabetes mellitus without complications: Secondary | ICD-10-CM | POA: Diagnosis not present

## 2016-10-07 DIAGNOSIS — Z7901 Long term (current) use of anticoagulants: Secondary | ICD-10-CM | POA: Diagnosis not present

## 2016-10-07 DIAGNOSIS — K2950 Unspecified chronic gastritis without bleeding: Secondary | ICD-10-CM | POA: Insufficient documentation

## 2016-10-07 DIAGNOSIS — Z79899 Other long term (current) drug therapy: Secondary | ICD-10-CM | POA: Insufficient documentation

## 2016-10-07 DIAGNOSIS — Z7984 Long term (current) use of oral hypoglycemic drugs: Secondary | ICD-10-CM | POA: Diagnosis not present

## 2016-10-07 DIAGNOSIS — E7800 Pure hypercholesterolemia, unspecified: Secondary | ICD-10-CM | POA: Diagnosis not present

## 2016-10-07 DIAGNOSIS — G4730 Sleep apnea, unspecified: Secondary | ICD-10-CM | POA: Insufficient documentation

## 2016-10-07 DIAGNOSIS — K219 Gastro-esophageal reflux disease without esophagitis: Secondary | ICD-10-CM | POA: Diagnosis not present

## 2016-10-07 HISTORY — DX: Presence of prosthetic heart valve: Z95.2

## 2016-10-07 HISTORY — DX: Type 2 diabetes mellitus without complications: E11.9

## 2016-10-07 HISTORY — PX: ESOPHAGOGASTRODUODENOSCOPY (EGD) WITH PROPOFOL: SHX5813

## 2016-10-07 LAB — GLUCOSE, CAPILLARY: GLUCOSE-CAPILLARY: 99 mg/dL (ref 65–99)

## 2016-10-07 SURGERY — ESOPHAGOGASTRODUODENOSCOPY (EGD) WITH PROPOFOL
Anesthesia: General

## 2016-10-07 MED ORDER — PROPOFOL 10 MG/ML IV BOLUS
INTRAVENOUS | Status: DC | PRN
  Administered 2016-10-07: 80 mg via INTRAVENOUS

## 2016-10-07 MED ORDER — SODIUM CHLORIDE 0.9 % IV SOLN
INTRAVENOUS | Status: DC
  Administered 2016-10-07 (×2): via INTRAVENOUS

## 2016-10-07 MED ORDER — PROPOFOL 500 MG/50ML IV EMUL
INTRAVENOUS | Status: DC | PRN
Start: 2016-10-07 — End: 2016-10-07
  Administered 2016-10-07: 150 ug/kg/min via INTRAVENOUS

## 2016-10-07 NOTE — H&P (Signed)
Jonathon Bellows MD [address] [place], [place] [address] Phone: [phone] Fax : [phone]  Primary Care Physician:  Glendon Axe, MD Primary Gastroenterologist:  Dr. Jonathon Bellows   Pre-Procedure History & Physical: HPI:  LAQUETA BONAVENTURA is a 45 y.o. female is here for an endoscopy.   Past Medical History:  Diagnosis Date  . Acid reflux   . Anxiety   . Arthritis   . Diabetes mellitus without complication ([place])   . Heart valve replaced   . High cholesterol   . High cholesterol   . Hypertension   . Hypokalemia   . IBS (irritable bowel syndrome)   . Sciatic pain     Past Surgical History:  Procedure Laterality Date  . ABDOMINAL HYSTERECTOMY    . BREAST EXCISIONAL BIOPSY Right 1992   NEG  . CARDIAC VALVE SURGERY    . CESAREAN SECTION    . HERNIA REPAIR      Prior to Admission medications   Medication Sig Start Date End Date Taking? Authorizing Provider  cyclobenzaprine (FLEXERIL) 5 MG tablet Take 5 mg by mouth at bedtime.  01/13/15  Yes Historical Provider, MD  diazepam (VALIUM) 5 MG tablet Take by mouth.   Yes Historical Provider, MD  hydrALAZINE (APRESOLINE) 25 MG tablet Take by mouth. 05/30/15  Yes Historical Provider, MD  HYDROcodone-acetaminophen (NORCO/VICODIN) 5-325 MG per tablet Take 1 tablet by mouth 2 (two) times daily as needed for moderate pain.   Yes Historical Provider, MD  levalbuterol Penne Lash) 1.25 MG/3ML nebulizer solution Inhale into the lungs. 02/08/16 02/07/17 Yes Historical Provider, MD  meclizine (ANTIVERT) 25 MG tablet Take by mouth. 05/11/15  Yes Historical Provider, MD  pantoprazole (PROTONIX) 40 MG tablet Take 1 tablet by mouth two  times daily 06/30/16  Yes Historical Provider, MD  pravastatin (PRAVACHOL) 20 MG tablet Take 20 mg by mouth daily.  12/07/14  Yes Historical Provider, MD  verapamil (CALAN-SR) 120 MG CR tablet Take by mouth. 06/15/15  Yes Historical Provider, MD  warfarin (COUMADIN) 7.5 MG tablet Take by mouth. 07/10/15  Yes  Historical Provider, MD  albuterol (PROAIR HFA) 108 (90 BASE) MCG/ACT inhaler Inhale into the lungs. 08/06/14   Historical Provider, MD  amoxicillin (AMOXIL) 500 MG capsule Take 2,000 mg by mouth as needed (for dental procedure).  01/20/15   Historical Provider, MD  amoxicillin-clavulanate (AUGMENTIN) 875-125 MG tablet Take 1 tablet by mouth 2 (two) times daily. One po bid x 7 days 02/19/16   Sherwood Gambler, MD  Biotin w/ Vitamins C & E 1250-7.5-7.5 MCG-MG-UNT CHEW Chew 2 tablets by mouth daily.    Historical Provider, MD  Blood Glucose Monitoring Suppl (FIFTY50 GLUCOSE METER 2.0) w/Device KIT Use as directed. DX: E11.9 One touch Verio 06/01/16 06/01/17  Historical Provider, MD  buPROPion (WELLBUTRIN) 100 MG tablet Take by mouth.    Historical Provider, MD  carvedilol (COREG) 3.125 MG tablet Take by mouth. 06/22/15 06/21/16  Historical Provider, MD  fluticasone (FLONASE) 50 MCG/ACT nasal spray Place into the nose. 07/21/15 07/20/16  Historical Provider, MD  furosemide (LASIX) 40 MG tablet Take by mouth. 07/03/15 10/01/15  Historical Provider, MD  gabapentin (NEURONTIN) 300 MG capsule Take by mouth. 07/24/15 07/23/16  Historical Provider, MD  Linaclotide Rolan Lipa) 290 MCG CAPS capsule Take by mouth. 05/04/15   Historical Provider, MD  loratadine (CLARITIN) 10 MG tablet Take by mouth. 07/21/15 07/20/16  Historical Provider, MD  potassium chloride (MICRO-K) 10 MEQ CR capsule Take by mouth. 07/10/15 10/08/15  Historical Provider, MD  predniSONE (DELTASONE) 20 MG tablet Take 2 tablets (40 mg total) by mouth daily. 02/19/16   Sherwood Gambler, MD  traMADol (ULTRAM) 50 MG tablet Take by mouth. 08/09/16   Historical Provider, MD    Allergies as of 09/20/2016 - Review Complete 09/20/2016  Allergen Reaction Noted  . Ivp dye [iodinated diagnostic agents] Anaphylaxis and Hives 06/14/2014  . Sulfa antibiotics Hives and Anaphylaxis 04/06/2013  . Guaifenesin Hives 04/18/2014  . Hctz [hydrochlorothiazide] Hives 06/14/2014  .  Losartan Itching 01/15/2015  . Povidone iodine Hives 07/24/2015  . Povidone-iodine Hives 04/15/2015  . Lisinopril Hives and Rash 04/18/2014  . Mucinex [guaifenesin er] Hives and Rash 04/06/2013    Family History  Problem Relation Age of Onset  . Breast cancer Paternal Grandmother     Social History   Social History  . Marital status: Married    Spouse name: N/A  . Number of children: N/A  . Years of education: N/A   Occupational History  . Not on file.   Social History Main Topics  . Smoking status: Former Smoker    Quit date: 03/08/2015  . Smokeless tobacco: Never Used  . Alcohol use No  . Drug use: No  . Sexual activity: Not on file   Other Topics Concern  . Not on file   Social History Narrative  . No narrative on file    Review of Systems: See HPI, otherwise negative ROS  Physical Exam: There were no vitals taken for this visit. General:   Alert,  pleasant and cooperative in NAD Head:  Normocephalic and atraumatic. Neck:  Supple; no masses or thyromegaly. Lungs:  Clear throughout to auscultation.    Heart:  Regular rate and rhythm. Abdomen:  Soft, nontender and nondistended. Normal bowel sounds, without guarding, and without rebound.   Neurologic:  Alert and  oriented x4;  grossly normal neurologically.  Impression/Plan: LATONIA CONROW is here for an endoscopy to be performed for evaluation for reflux and prior to bariatric surgery   Risks, benefits, limitations, and alternatives regarding  endoscopy have been reviewed with the patient.  Questions have been answered.  All parties agreeable.   Jonathon Bellows, MD  10/07/2016, 8:41 AM

## 2016-10-07 NOTE — Anesthesia Procedure Notes (Signed)
Date/Time: 10/07/2016 9:29 AM Performed by: Nelda Marseille Pre-anesthesia Checklist: Patient identified, Emergency Drugs available, Suction available, Patient being monitored and Timeout performed Oxygen Delivery Method: Nasal cannula

## 2016-10-07 NOTE — Transfer of Care (Signed)
Immediate Anesthesia Transfer of Care Note  Patient: Shelly Huynh  Procedure(s) Performed: Procedure(s): ESOPHAGOGASTRODUODENOSCOPY (EGD) WITH PROPOFOL (N/A)  Patient Location: PACU  Anesthesia Type:General  Level of Consciousness: awake and sedated  Airway & Oxygen Therapy: Patient Spontanous Breathing and Patient connected to nasal cannula oxygen  Post-op Assessment: Report given to RN and Post -op Vital signs reviewed and stable  Post vital signs: Reviewed and stable  Last Vitals:  Vitals:   10/07/16 0841  BP: (!) 156/83  Pulse: 60  Resp: 20  Temp: 36.9 C    Last Pain:  Vitals:   10/07/16 0841  TempSrc: Tympanic         Complications: No apparent anesthesia complications

## 2016-10-07 NOTE — Anesthesia Postprocedure Evaluation (Signed)
Anesthesia Post Note  Patient: Shelly Huynh  Procedure(s) Performed: Procedure(s) (LRB): ESOPHAGOGASTRODUODENOSCOPY (EGD) WITH PROPOFOL (N/A)  Patient location during evaluation: Endoscopy Anesthesia Type: General Level of consciousness: awake and alert Pain management: pain level controlled Vital Signs Assessment: post-procedure vital signs reviewed and stable Respiratory status: spontaneous breathing and respiratory function stable Cardiovascular status: stable Anesthetic complications: no    Last Vitals:  Vitals:   10/07/16 0951 10/07/16 1001  BP: (!) 144/80 (!) 147/83  Pulse: 61   Resp: 16   Temp:      Last Pain:  Vitals:   10/07/16 0841  TempSrc: Tympanic                 Haralambos Yeatts K

## 2016-10-07 NOTE — Anesthesia Preprocedure Evaluation (Signed)
Anesthesia Evaluation  Patient identified by MRN, date of birth, ID band Patient awake    Reviewed: Allergy & Precautions, NPO status , Patient's Chart, lab work & pertinent test results  History of Anesthesia Complications Negative for: history of anesthetic complications  Airway Mallampati: II       Dental   Pulmonary sleep apnea , COPD,  COPD inhaler, former smoker,           Cardiovascular hypertension, Pt. on medications + CAD and +CHF  + Valvular Problems/Murmurs (s/p aortic, mitral, and pulmonic valve replacement)      Neuro/Psych Anxiety    GI/Hepatic GERD  Medicated,  Endo/Other  diabetes, Type 2, Oral Hypoglycemic AgentsMorbid obesity  Renal/GU      Musculoskeletal  (+) Arthritis ,   Abdominal   Peds  Hematology  (+) anemia ,   Anesthesia Other Findings   Reproductive/Obstetrics                             Anesthesia Physical Anesthesia Plan  ASA: III  Anesthesia Plan: General   Post-op Pain Management:    Induction: Intravenous  Airway Management Planned: Nasal Cannula  Additional Equipment:   Intra-op Plan:   Post-operative Plan:   Informed Consent: I have reviewed the patients History and Physical, chart, labs and discussed the procedure including the risks, benefits and alternatives for the proposed anesthesia with the patient or authorized representative who has indicated his/her understanding and acceptance.     Plan Discussed with:   Anesthesia Plan Comments:         Anesthesia Quick Evaluation

## 2016-10-07 NOTE — Op Note (Signed)
[facility] Gastroenterology Patient Name: Shelly Huynh Procedure Date: 10/07/2016 9:18 AM MRN: [id number] Account #: [id number] Date of Birth: 03/05/1971 Admit Type: Ambulatory Age: 45 Room: [facility] ENDO ROOM 4 Gender: Female Note Status: Finalized Procedure:            Upper GI endoscopy Indications:          For therapy of gastro-esophageal reflux disease,                        Preoperative assessment for bariatric surgery to treat                        morbid obesity Providers:            Jonathon Bellows MD, MD Referring MD:         Glendon Axe (Referring MD) Medicines:            Monitored Anesthesia Care Complications:        No immediate complications. Procedure:            Pre-Anesthesia Assessment:                       - Prior to the procedure, a History and Physical was                        performed, and patient medications, allergies and                        sensitivities were reviewed. The patient's tolerance of                        previous anesthesia was reviewed.                       - The risks and benefits of the procedure and the                        sedation options and risks were discussed with the                        patient. All questions were answered and informed                        consent was obtained.                       - ASA Grade Assessment: III - A patient with severe                        systemic disease.                       - Prior to the procedure, a History and Physical was                        performed, and patient medications, allergies and                        sensitivities were reviewed. The patient's tolerance of  previous anesthesia was reviewed.                       - The risks and benefits of the procedure and the                        sedation options and risks were discussed with the                        patient. All questions were answered and informed              consent was obtained.                       After obtaining informed consent, the endoscope was                        passed under direct vision. Throughout the procedure,                        the patient's blood pressure, pulse, and oxygen                        saturations were monitored continuously. The Endoscope                        was introduced through the mouth, and advanced to the                        third part of duodenum. The upper GI endoscopy was                        accomplished with ease. The patient tolerated the                        procedure well. Findings:      The esophagus was normal.      The examined duodenum was normal.      Diffuse mild inflammation characterized by congestion (edema) and       erythema was found in the gastric antrum. Biopsies were taken with a       cold forceps for histology.      The GE junction was at 40 cm mark from the incisors, the upper margin of       gastric folds , diaphargm were also at the 40 cm mark. There was no bile       in the stomach seen. Impression:           - Normal esophagus.                       - Normal examined duodenum.                       - Gastritis. Biopsied. Recommendation:       - Await pathology results.                       - Resume previous diet.                       - Patient has a contact number available for  emergencies. The signs and symptoms of potential                        delayed complications were discussed with the patient.                        Return to normal activities tomorrow. Written discharge                        instructions were provided to the patient. Procedure Code(s):    --- Professional ---                       [phone], Esophagogastroduodenoscopy, flexible, transoral;                        with biopsy, single or multiple Diagnosis Code(s):    --- Professional ---                       K29.70, Gastritis, unspecified, without  bleeding                       K21.9, Gastro-esophageal reflux disease without                        esophagitis                       Z01.818, Encounter for other preprocedural examination                       E66.01, Morbid (severe) obesity due to excess calories CPT copyright 2016 American Medical Association. All rights reserved. The codes documented in this report are preliminary and upon coder review may  be revised to meet current compliance requirements. Jonathon Bellows, MD Jonathon Bellows MD, MD 10/07/2016 9:36:38 AM This report has been signed electronically. Number of Addenda: 0 Note Initiated On: 10/07/2016 9:18 AM      [facility]

## 2016-10-10 ENCOUNTER — Encounter: Payer: Self-pay | Admitting: Gastroenterology

## 2016-10-10 LAB — SURGICAL PATHOLOGY

## 2016-10-18 ENCOUNTER — Ambulatory Visit: Payer: Managed Care, Other (non HMO) | Admitting: Gastroenterology

## 2016-10-24 ENCOUNTER — Telehealth: Payer: Self-pay

## 2016-10-24 NOTE — Telephone Encounter (Signed)
Please review pathology results. Do not see results were sent to your inbox.

## 2016-10-26 NOTE — Telephone Encounter (Signed)
Pt notified of EGD results.

## 2016-10-26 NOTE — Telephone Encounter (Signed)
Antral gastritis with No H pylori

## 2017-01-05 DIAGNOSIS — R768 Other specified abnormal immunological findings in serum: Secondary | ICD-10-CM | POA: Insufficient documentation

## 2017-03-10 ENCOUNTER — Ambulatory Visit (INDEPENDENT_AMBULATORY_CARE_PROVIDER_SITE_OTHER): Payer: Managed Care, Other (non HMO) | Admitting: Podiatry

## 2017-03-10 DIAGNOSIS — L603 Nail dystrophy: Secondary | ICD-10-CM | POA: Diagnosis not present

## 2017-03-10 DIAGNOSIS — B351 Tinea unguium: Secondary | ICD-10-CM | POA: Diagnosis not present

## 2017-03-10 DIAGNOSIS — Z79899 Other long term (current) drug therapy: Secondary | ICD-10-CM

## 2017-03-10 DIAGNOSIS — L608 Other nail disorders: Secondary | ICD-10-CM

## 2017-03-10 DIAGNOSIS — M79609 Pain in unspecified limb: Secondary | ICD-10-CM

## 2017-03-12 NOTE — Progress Notes (Signed)
   Subjective: Patient presents today for possible treatment and evaluation of fungal nails bilaterally 1 through 5. Patient states that the nails have been discolored and thickened for several years. She states she has used Lamisil in the past without successful treatment of the symptoms. Patient presents today for further treatment and evaluation.  Objective: Physical Exam General: The patient is alert and oriented x3 in no acute distress.  Dermatology: Hyperkeratotic, discolored, thickened, onychodystrophy of nails noted bilaterally.  Skin is warm, dry and supple bilateral lower extremities. Negative for open lesions or macerations.  Vascular: Palpable pedal pulses bilaterally. No edema or erythema noted. Capillary refill within normal limits.  Neurological: Epicritic and protective threshold grossly intact bilaterally.   Musculoskeletal Exam: Range of motion within normal limits to all pedal and ankle joints bilateral. Muscle strength 5/5 in all groups bilateral.   Assessment: #1 onychodystrophy bilateral toenails #2 possible onychomycosis #3 hyperkeratotic nails bilateral  Plan of Care:  #1 Patient was evaluated. #2 Orders for liver function tests were ordered today.  #3 Today nail biopsy was taken and sent to pathology for fungal culture. #4 we will call patient with fungal nail biopsy results and call in prescription for Lamisil. #5 Appt with Janett Billow, RN for antifungal laser treatment. #6 Return to clinic in 6 months.   Edrick Kins, DPM [facility]  Dr. Edrick Kins, [place]                                        [place], [place] [address]                Office [phone]  Fax [phone]

## 2017-03-17 LAB — HEPATIC FUNCTION PANEL
ALK PHOS: 84 IU/L (ref 39–117)
ALT: 21 IU/L (ref 0–32)
AST: 29 IU/L (ref 0–40)
Albumin: 4.3 g/dL (ref 3.5–5.5)
BILIRUBIN, DIRECT: 0.14 mg/dL (ref 0.00–0.40)
Bilirubin Total: 0.3 mg/dL (ref 0.0–1.2)
Total Protein: 7.9 g/dL (ref 6.0–8.5)

## 2017-03-20 ENCOUNTER — Telehealth: Payer: Self-pay | Admitting: *Deleted

## 2017-03-20 NOTE — Telephone Encounter (Addendum)
Pt states her toenail fungus was negative, would the laser treatment help her toenails.03/21/2017-I informed pt of J. Nelma Rothman, RN's statement. Pt asked if there was anything she could do. I told pt for thickness she could use an urea cream daily, it would soften nails to be filed thinner and easier to trim, and if the thickness was causing the color change that may help.I told pt the Revitaderm40 could be purchased at the [place] office. Pt asked if she could use her flexible spending for the purchase and I told her I was not certain, but could try. Sent Revitaderm40 to [employer] office with A. Prevette.

## 2017-03-21 ENCOUNTER — Ambulatory Visit: Payer: Managed Care, Other (non HMO)

## 2017-03-21 NOTE — Telephone Encounter (Signed)
Laser is for positive fungal culture only. It wont help onychodystrophy

## 2017-03-27 ENCOUNTER — Ambulatory Visit: Payer: Managed Care, Other (non HMO)

## 2017-04-04 LAB — FUNGUS CULTURE W SMEAR

## 2017-04-24 DIAGNOSIS — R0602 Shortness of breath: Secondary | ICD-10-CM | POA: Insufficient documentation

## 2017-05-01 ENCOUNTER — Ambulatory Visit (INDEPENDENT_AMBULATORY_CARE_PROVIDER_SITE_OTHER): Payer: Managed Care, Other (non HMO) | Admitting: Podiatry

## 2017-05-01 DIAGNOSIS — B351 Tinea unguium: Secondary | ICD-10-CM

## 2017-05-01 DIAGNOSIS — L608 Other nail disorders: Secondary | ICD-10-CM

## 2017-05-09 NOTE — Progress Notes (Signed)
Pt presents with discolored nails 1-5 bilateral.   All other systems are negative  Advised that since her fungal culture results were negative that there is no guarantee that this treatment will change the appearance of her nails  Laser therapy administered to affected nails and tolerated well. All safety precautions were in place. Re-appointed in 1 month for 2nd treatment

## 2017-06-01 ENCOUNTER — Ambulatory Visit: Payer: Managed Care, Other (non HMO) | Admitting: Podiatry

## 2017-06-01 DIAGNOSIS — B351 Tinea unguium: Secondary | ICD-10-CM

## 2017-06-01 DIAGNOSIS — M79609 Pain in unspecified limb: Principal | ICD-10-CM

## 2017-06-07 NOTE — Progress Notes (Signed)
Pt presents with discolored nails 1-5 bilateral. Nails are becoming lighter in color  All other systems are negative   Laser therapy administered to affected nails and tolerated well. All safety precautions were in place. Re-appointed in 1 month for 3rd treatment

## 2017-06-12 ENCOUNTER — Emergency Department
Admission: EM | Admit: 2017-06-12 | Discharge: 2017-06-12 | Disposition: A | Discharge status: Home / Self Care | Payer: Managed Care, Other (non HMO) | Attending: Emergency Medicine | Admitting: Emergency Medicine

## 2017-06-12 ENCOUNTER — Encounter: Payer: Self-pay | Admitting: Emergency Medicine

## 2017-06-12 ENCOUNTER — Emergency Department: Payer: Managed Care, Other (non HMO)

## 2017-06-12 DIAGNOSIS — J189 Pneumonia, unspecified organism: Secondary | ICD-10-CM | POA: Diagnosis not present

## 2017-06-12 DIAGNOSIS — E119 Type 2 diabetes mellitus without complications: Secondary | ICD-10-CM | POA: Insufficient documentation

## 2017-06-12 DIAGNOSIS — I5043 Acute on chronic combined systolic (congestive) and diastolic (congestive) heart failure: Secondary | ICD-10-CM | POA: Insufficient documentation

## 2017-06-12 DIAGNOSIS — Z7901 Long term (current) use of anticoagulants: Secondary | ICD-10-CM | POA: Diagnosis not present

## 2017-06-12 DIAGNOSIS — Z952 Presence of prosthetic heart valve: Secondary | ICD-10-CM | POA: Insufficient documentation

## 2017-06-12 DIAGNOSIS — Z7902 Long term (current) use of antithrombotics/antiplatelets: Secondary | ICD-10-CM | POA: Diagnosis not present

## 2017-06-12 DIAGNOSIS — R509 Fever, unspecified: Secondary | ICD-10-CM | POA: Diagnosis present

## 2017-06-12 DIAGNOSIS — I10 Essential (primary) hypertension: Secondary | ICD-10-CM | POA: Diagnosis not present

## 2017-06-12 DIAGNOSIS — Z87891 Personal history of nicotine dependence: Secondary | ICD-10-CM | POA: Insufficient documentation

## 2017-06-12 DIAGNOSIS — Z79899 Other long term (current) drug therapy: Secondary | ICD-10-CM | POA: Diagnosis not present

## 2017-06-12 LAB — COMPREHENSIVE METABOLIC PANEL
ALK PHOS: 88 U/L (ref 38–126)
ALT: 23 U/L (ref 14–54)
ANION GAP: 11 (ref 5–15)
AST: 34 U/L (ref 15–41)
Albumin: 4.1 g/dL (ref 3.5–5.0)
BILIRUBIN TOTAL: 1.2 mg/dL (ref 0.3–1.2)
BUN: 11 mg/dL (ref 6–20)
CALCIUM: 9.1 mg/dL (ref 8.9–10.3)
CO2: 21 mmol/L — ABNORMAL LOW (ref 22–32)
Chloride: 105 mmol/L (ref 101–111)
Creatinine, Ser: 0.81 mg/dL (ref 0.44–1.00)
GFR calc Af Amer: >60 mL/min (ref >60)
Glucose, Bld: 83 mg/dL (ref 65–99)
POTASSIUM: 3.5 mmol/L (ref 3.5–5.1)
Sodium: 137 mmol/L (ref 135–145)
TOTAL PROTEIN: 9 g/dL — AB (ref 6.5–8.1)

## 2017-06-12 LAB — CBC WITH DIFFERENTIAL/PLATELET
Basophils Absolute: 0.1 10*3/uL (ref 0–0.1)
Basophils Relative: 1 %
Eosinophils Absolute: 0.7 10*3/uL (ref 0–0.7)
Eosinophils Relative: 8 %
HEMATOCRIT: 40.2 % (ref 35.0–47.0)
HEMOGLOBIN: 12.8 g/dL (ref 12.0–16.0)
LYMPHS PCT: 11 %
Lymphs Abs: 0.9 10*3/uL — ABNORMAL LOW (ref 1.0–3.6)
MCH: 26.8 pg (ref 26.0–34.0)
MCHC: 32 g/dL (ref 32.0–36.0)
MCV: 83.8 fL (ref 80.0–100.0)
MONO ABS: 1.5 10*3/uL — AB (ref 0.2–0.9)
Monocytes Relative: 17 %
NEUTROS ABS: 5.4 10*3/uL (ref 1.4–6.5)
Neutrophils Relative %: 63 %
Platelets: 234 10*3/uL (ref 150–440)
RBC: 4.8 MIL/uL (ref 3.80–5.20)
RDW: 19.6 % — AB (ref 11.5–14.5)
WBC: 8.6 10*3/uL (ref 3.6–11.0)

## 2017-06-12 LAB — LACTIC ACID, PLASMA: Lactic Acid, Venous: 0.9 mmol/L (ref 0.5–1.9)

## 2017-06-12 LAB — TROPONIN I

## 2017-06-12 MED ORDER — LEVOFLOXACIN IN D5W 750 MG/150ML IV SOLN
750.0000 mg | Freq: Once | INTRAVENOUS | Status: AC
  Administered 2017-06-12: 750 mg via INTRAVENOUS
  Filled 2017-06-12: qty 150

## 2017-06-12 MED ORDER — BENZONATATE 100 MG PO CAPS: 100.0000 mg | ORAL_CAPSULE | Freq: Four times a day (QID) | ORAL | 0 refills | Status: DC | PRN

## 2017-06-12 MED ORDER — LEVOFLOXACIN 750 MG PO TABS: 750.0000 mg | ORAL_TABLET | Freq: Every day | ORAL | 0 refills | Status: AC

## 2017-06-12 MED ORDER — ACETAMINOPHEN 500 MG PO TABS
1000.0000 mg | ORAL_TABLET | Freq: Once | ORAL | Status: AC
  Administered 2017-06-12: 1000 mg via ORAL
  Filled 2017-06-12: qty 2

## 2017-06-12 NOTE — ED Notes (Addendum)
Patient discharge and follow up information reviewed with patient by ED nursing staff and patient given the opportunity to ask questions pertaining to ED visit and discharge plan of care. Patient advised that should symptoms not continue to improve, resolve entirely, or should new symptoms develop then a follow up visit with their PCP or a return visit to the ED may be warranted. Patient verbalized consent and understanding of discharge plan of care including potential need for further evaluation. Patient being discharged in stable condition per attending ED physician on duty.   Pt states she will continue to use incentive spirometer at home.

## 2017-06-12 NOTE — ED Provider Notes (Signed)
[facility] Emergency Department Provider Note   ____________________________________________   I have reviewed the triage vital signs and the nursing notes.   HISTORY  Chief Complaint Fever and Pneumonia   History limited by: Not Limited   HPI Shelly Huynh is a 46 y.o. female who presents to the emergency department today because of concerns for worsening pneumonia. The patient states that she has had issues with her breathing for over a week. She went to urgent care where she states that she was told she had a pneumonia per chest x-ray. She was placed on clindamycin 2 times a day. She states she has not felt any improvement. Today she started having fevers and felt worse.  Past Medical History:  Diagnosis Date  . Acid reflux   . Anxiety   . Arthritis   . Diabetes mellitus without complication ([place])   . Heart valve replaced   . High cholesterol   . High cholesterol   . Hypertension   . Hypokalemia   . IBS (irritable bowel syndrome)   . Sciatic pain     Patient Active Problem List   Diagnosis Date Noted  . Tobacco abuse 09/19/2016  . Bilateral leg edema 08/23/2016  . Osteoarthritis of spine with radiculopathy, lumbar region 08/08/2016  . Diabetes mellitus type 2, diet-controlled ([place]) 05/17/2016  . Vitamin D deficiency 05/17/2016  . Gastroesophageal reflux disease without esophagitis 10/12/2015  . Spondylosis of lumbar region without myelopathy or radiculopathy 10/12/2015  . Adiposity 06/30/2015  . Compulsive tobacco user syndrome 06/30/2015  . Chronic hypokalemia 06/15/2015  . Palpitations 05/18/2015  . Chronic anticoagulation 05/07/2015  . Benign essential HTN 05/05/2015  . Absolute anemia 04/26/2015  . Acute on chronic combined systolic and diastolic congestive heart failure ([place]) 04/08/2015  . H/O aortic valve replacement 04/07/2015  . H/O heart valve replacement with mechanical valve 04/07/2015  . H/O prosthetic heart valve  04/07/2015  . 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 Acute right heart failure ([place]) 04/04/2015  . History of open heart surgery 04/03/2015  . H/O mitral valve replacement with mechanical valve 04/03/2015  . 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 Acute respiratory failure with hypoxemia ([place]) 03/05/2015  . Coronary artery disease due to calcified coronary lesion 03/04/2015  . Obstructive apnea 10/28/2014  . Enlarged heart 10/28/2014  . Combined fat and carbohydrate induced hyperlipemia 09/11/2014    Past Surgical History:  Procedure Laterality Date  . ABDOMINAL HYSTERECTOMY    . BREAST EXCISIONAL BIOPSY Right 1992   NEG  . CARDIAC VALVE SURGERY    . CESAREAN SECTION    . ESOPHAGOGASTRODUODENOSCOPY (EGD) WITH PROPOFOL N/A 10/07/2016   Procedure: ESOPHAGOGASTRODUODENOSCOPY (EGD) WITH PROPOFOL;  Surgeon: Jonathon Bellows, MD;  Location: [facility] ENDOSCOPY;  Service: Endoscopy;  Laterality: N/A;  . HERNIA REPAIR      Prior to Admission medications   Medication Sig Start Date End Date Taking? Authorizing Provider  albuterol (PROAIR HFA) 108 (90 BASE) MCG/ACT inhaler Inhale into the lungs. 08/06/14   [provider]  amoxicillin (AMOXIL) 500 MG capsule Take 2,000 mg by mouth as needed (for dental procedure).  01/20/15   [provider]  buPROPion (WELLBUTRIN) 100 MG tablet Take by mouth.    [provider]  carvedilol (COREG) 3.125 MG tablet Take by mouth. 06/22/15 10/07/16  [provider]  cyclobenzaprine (FLEXERIL) 5 MG tablet Take 5 mg by mouth at bedtime.  01/13/15   [provider]  diazepam (VALIUM) 5 MG tablet  Take by mouth.    [provider]  fluticasone (FLONASE) 50 MCG/ACT nasal spray Place into the nose. 07/21/15 07/20/16  [provider]  furosemide (LASIX) 40 MG tablet Take by mouth. 07/03/15 10/01/15  [provider]  hydrALAZINE (APRESOLINE) 25 MG tablet Take by mouth. 05/30/15   [provider]   HYDROcodone-acetaminophen (NORCO/VICODIN) 5-325 MG per tablet Take 1 tablet by mouth 2 (two) times daily as needed for moderate pain.    [provider]  levalbuterol Penne Lash) 1.25 MG/3ML nebulizer solution Inhale into the lungs. 02/08/16 02/07/17  [provider]  Linaclotide Rolan Lipa) 290 MCG CAPS capsule Take by mouth. 05/04/15   [provider]  loratadine (CLARITIN) 10 MG tablet Take by mouth. 07/21/15 07/20/16  [provider]  meclizine (ANTIVERT) 25 MG tablet Take by mouth. 05/11/15   [provider]  pantoprazole (PROTONIX) 40 MG tablet Take 1 tablet by mouth two  times daily 06/30/16   [provider]  potassium chloride (MICRO-K) 10 MEQ CR capsule Take by mouth. 07/10/15 10/08/15  [provider]  pravastatin (PRAVACHOL) 20 MG tablet Take 20 mg by mouth daily.  12/07/14   [provider]  predniSONE (DELTASONE) 20 MG tablet Take 2 tablets (40 mg total) by mouth daily. 02/19/16   Sherwood Gambler, MD  traMADol (ULTRAM) 50 MG tablet Take by mouth. 08/09/16   [provider]  verapamil (CALAN-SR) 120 MG CR tablet Take by mouth. 06/15/15   [provider]  warfarin (COUMADIN) 7.5 MG tablet Take by mouth. 07/10/15   [provider]    Allergies Ivp dye [iodinated diagnostic agents]; Sulfa antibiotics; Banana; Guaifenesin; Hctz [hydrochlorothiazide]; Losartan; Povidone iodine; Povidone-iodine; Lisinopril; and Mucinex [guaifenesin er]  Family History  Problem Relation Age of Onset  . Breast cancer Paternal Grandmother     Social History Social History  Substance Use Topics  . Smoking status: Former Smoker    Quit date: 03/08/2015  . Smokeless tobacco: Never Used  . Alcohol use No    Review of Systems Constitutional: Positive for fever. Eyes: No visual changes. ENT: No sore throat. Cardiovascular: Denies chest pain. Respiratory: Positive for shortness of breath.  Gastrointestinal: No  abdominal pain.  No nausea, no vomiting.  No diarrhea.   Genitourinary: Negative for dysuria. Musculoskeletal: Negative for back pain. Skin: Negative for rash. Neurological: Negative for headaches, focal weakness or numbness.  ____________________________________________   PHYSICAL EXAM:  VITAL SIGNS: ED Triage Vitals  Enc Vitals Group     BP 06/12/17 1608 (!) 169/94     Pulse Rate 06/12/17 1608 90     Resp 06/12/17 1608 (!) 22     Temp 06/12/17 1608 (!) 102 F (38.9 C)     Temp Source 06/12/17 1608 Oral     SpO2 06/12/17 1608 93 %     Weight 06/12/17 1607 237 lb (107.5 kg)     Height 06/12/17 1607 5' 2.5" (1.588 m)     Head Circumference --      Peak Flow --      Pain Score 06/12/17 1606 8    Constitutional: Alert and oriented. Well appearing and in no distress. Eyes: Conjunctivae are normal.  ENT   Head: Normocephalic and atraumatic.   Nose: No congestion/rhinnorhea.   Mouth/Throat: Mucous membranes are moist.   Neck: No stridor. Hematological/Lymphatic/Immunilogical: No cervical lymphadenopathy. Cardiovascular: Normal rate, regular rhythm.  No murmurs, rubs, or gallops.  Respiratory: Normal respiratory effort without tachypnea nor retractions. Breath sounds are clear  and equal bilaterally. No wheezes/rales/rhonchi. Gastrointestinal: Soft and non tender. No rebound. No guarding.  Genitourinary: Deferred Musculoskeletal: Normal range of motion in all extremities. No lower extremity edema. Neurologic:  Normal speech and language. No gross focal neurologic deficits are appreciated.  Skin:  Skin is warm, dry and intact. No rash noted. Psychiatric: Mood and affect are normal. Speech and behavior are normal. Patient exhibits appropriate insight and judgment.  ____________________________________________    LABS (pertinent positives/negatives)  Labs Reviewed  CBC WITH DIFFERENTIAL/PLATELET - Abnormal; Notable for the following:       Result Value   RDW 19.6  (*)    Lymphs Abs 0.9 (*)    Monocytes Absolute 1.5 (*)    All other components within normal limits  COMPREHENSIVE METABOLIC PANEL - Abnormal; Notable for the following:    CO2 21 (*)    Total Protein 9.0 (*)    All other components within normal limits  CULTURE, BLOOD (ROUTINE X 2)  CULTURE, BLOOD (ROUTINE X 2)  LACTIC ACID, PLASMA  TROPONIN I  LACTIC ACID, PLASMA     ____________________________________________   EKG  I, Nance Pear, attending physician, personally viewed and interpreted this EKG  EKG Time: 1634 Rate: 89 Rhythm: normal sinus rhythm Axis: normal Intervals: qtc 458 QRS: narrow, LVH ST changes: no st elevation Impression: abnormal ekg   ____________________________________________    RADIOLOGY  CXR  IMPRESSION: 1. Cardiomegaly with mild central congestion 2. Patchy perihilar pulmonary opacity could relate to mild edema or pulmonary infiltrates.  ____________________________________________   PROCEDURES  Procedures  ____________________________________________   INITIAL IMPRESSION / ASSESSMENT AND PLAN / ED COURSE  Pertinent labs & imaging results that were available during my care of the patient were reviewed by me and considered in my medical decision making (see chart for details).  Patient presented to the emergency department today with concerns for continued pneumonia. Chest x-ray here does show possible infiltrate. Blood work without any concerning leukocytosis or elevation of the lactic acid level. Patient was given dose of IV Levaquin here. Will plan on discharging with further Levaquin.   ____________________________________________   FINAL CLINICAL IMPRESSION(S) / ED DIAGNOSES  Final diagnoses:  Community acquired pneumonia, unspecified laterality     Note: This dictation was prepared with Sales executive. Any transcriptional errors that result from this process are unintentional     Nance Pear,  MD 06/12/17 1909

## 2017-06-12 NOTE — ED Triage Notes (Signed)
Pt reports recent dx of pneumonia. Pt reports return of fever today. Pt with audible wheezing in triage.

## 2017-06-12 NOTE — ED Notes (Signed)
Patient transported to X-ray 

## 2017-06-12 NOTE — Discharge Instructions (Signed)
Please seek medical attention for any high fevers, chest pain, shortness of breath, change in behavior, persistent vomiting, bloody stool or any other new or concerning symptoms.  

## 2017-06-12 NOTE — ED Notes (Signed)
Pt eating, NAD, no needs

## 2017-06-17 LAB — CULTURE, BLOOD (ROUTINE X 2)
CULTURE: NO GROWTH
Culture: NO GROWTH
Special Requests: ADEQUATE

## 2017-06-19 ENCOUNTER — Other Ambulatory Visit: Payer: Self-pay | Admitting: Internal Medicine

## 2017-06-19 DIAGNOSIS — Z1231 Encounter for screening mammogram for malignant neoplasm of breast: Secondary | ICD-10-CM

## 2017-06-29 ENCOUNTER — Ambulatory Visit: Payer: Managed Care, Other (non HMO)

## 2017-07-04 ENCOUNTER — Ambulatory Visit
Admission: RE | Admit: 2017-07-04 | Discharge: 2017-07-04 | Disposition: A | Discharge status: Home / Self Care | Payer: Managed Care, Other (non HMO) | Source: Ambulatory Visit | Attending: Internal Medicine | Admitting: Internal Medicine

## 2017-07-04 DIAGNOSIS — Z1231 Encounter for screening mammogram for malignant neoplasm of breast: Secondary | ICD-10-CM | POA: Insufficient documentation

## 2017-07-07 ENCOUNTER — Other Ambulatory Visit: Payer: Self-pay | Admitting: Internal Medicine

## 2017-07-07 DIAGNOSIS — R928 Other abnormal and inconclusive findings on diagnostic imaging of breast: Secondary | ICD-10-CM

## 2017-07-07 DIAGNOSIS — R921 Mammographic calcification found on diagnostic imaging of breast: Secondary | ICD-10-CM

## 2017-07-18 ENCOUNTER — Ambulatory Visit
Admission: RE | Admit: 2017-07-18 | Discharge: 2017-07-18 | Disposition: A | Discharge status: Home / Self Care | Payer: Managed Care, Other (non HMO) | Source: Ambulatory Visit | Attending: Internal Medicine | Admitting: Internal Medicine

## 2017-07-18 ENCOUNTER — Other Ambulatory Visit: Payer: Self-pay | Admitting: Internal Medicine

## 2017-07-18 DIAGNOSIS — R921 Mammographic calcification found on diagnostic imaging of breast: Secondary | ICD-10-CM

## 2017-07-18 DIAGNOSIS — R928 Other abnormal and inconclusive findings on diagnostic imaging of breast: Secondary | ICD-10-CM

## 2017-07-20 ENCOUNTER — Other Ambulatory Visit: Payer: Self-pay | Admitting: Internal Medicine

## 2017-07-20 DIAGNOSIS — R921 Mammographic calcification found on diagnostic imaging of breast: Secondary | ICD-10-CM

## 2017-07-20 DIAGNOSIS — R928 Other abnormal and inconclusive findings on diagnostic imaging of breast: Secondary | ICD-10-CM

## 2017-07-25 ENCOUNTER — Ambulatory Visit
Admission: RE | Admit: 2017-07-25 | Discharge: 2017-07-25 | Disposition: A | Discharge status: Home / Self Care | Payer: Managed Care, Other (non HMO) | Source: Ambulatory Visit | Attending: Internal Medicine | Admitting: Internal Medicine

## 2017-07-25 DIAGNOSIS — R921 Mammographic calcification found on diagnostic imaging of breast: Secondary | ICD-10-CM | POA: Diagnosis present

## 2017-07-25 DIAGNOSIS — R928 Other abnormal and inconclusive findings on diagnostic imaging of breast: Secondary | ICD-10-CM

## 2017-07-25 HISTORY — PX: BREAST BIOPSY: SHX20

## 2017-07-27 LAB — SURGICAL PATHOLOGY

## 2017-09-15 ENCOUNTER — Encounter: Payer: Managed Care, Other (non HMO) | Admitting: Podiatry

## 2017-09-21 DIAGNOSIS — I059 Rheumatic mitral valve disease, unspecified: Secondary | ICD-10-CM | POA: Insufficient documentation

## 2017-09-21 DIAGNOSIS — R071 Chest pain on breathing: Secondary | ICD-10-CM | POA: Insufficient documentation

## 2017-09-21 DIAGNOSIS — I079 Rheumatic tricuspid valve disease, unspecified: Secondary | ICD-10-CM | POA: Insufficient documentation

## 2017-09-21 DIAGNOSIS — I069 Rheumatic aortic valve disease, unspecified: Secondary | ICD-10-CM | POA: Insufficient documentation

## 2017-09-26 NOTE — Progress Notes (Signed)
This encounter was created in error - please disregard.

## 2017-10-02 ENCOUNTER — Emergency Department: Payer: Managed Care, Other (non HMO)

## 2017-10-02 ENCOUNTER — Encounter: Payer: Self-pay | Admitting: Emergency Medicine

## 2017-10-02 ENCOUNTER — Other Ambulatory Visit: Payer: Self-pay

## 2017-10-02 ENCOUNTER — Inpatient Hospital Stay
Admission: EM | Admit: 2017-10-02 | Discharge: 2017-10-04 | DRG: 186 | Disposition: A | Discharge status: Home / Self Care | Payer: Managed Care, Other (non HMO) | Attending: Internal Medicine | Admitting: Internal Medicine

## 2017-10-02 DIAGNOSIS — K589 Irritable bowel syndrome without diarrhea: Secondary | ICD-10-CM | POA: Diagnosis present

## 2017-10-02 DIAGNOSIS — J90 Pleural effusion, not elsewhere classified: Principal | ICD-10-CM | POA: Diagnosis present

## 2017-10-02 DIAGNOSIS — Z87891 Personal history of nicotine dependence: Secondary | ICD-10-CM

## 2017-10-02 DIAGNOSIS — Y95 Nosocomial condition: Secondary | ICD-10-CM | POA: Diagnosis present

## 2017-10-02 DIAGNOSIS — Z7901 Long term (current) use of anticoagulants: Secondary | ICD-10-CM

## 2017-10-02 DIAGNOSIS — Z91041 Radiographic dye allergy status: Secondary | ICD-10-CM

## 2017-10-02 DIAGNOSIS — Z882 Allergy status to sulfonamides status: Secondary | ICD-10-CM

## 2017-10-02 DIAGNOSIS — I5033 Acute on chronic diastolic (congestive) heart failure: Secondary | ICD-10-CM | POA: Diagnosis present

## 2017-10-02 DIAGNOSIS — I50811 Acute right heart failure: Secondary | ICD-10-CM

## 2017-10-02 DIAGNOSIS — J189 Pneumonia, unspecified organism: Secondary | ICD-10-CM | POA: Diagnosis present

## 2017-10-02 DIAGNOSIS — Z952 Presence of prosthetic heart valve: Secondary | ICD-10-CM

## 2017-10-02 DIAGNOSIS — I2510 Atherosclerotic heart disease of native coronary artery without angina pectoris: Secondary | ICD-10-CM | POA: Diagnosis present

## 2017-10-02 DIAGNOSIS — Z9889 Other specified postprocedural states: Secondary | ICD-10-CM

## 2017-10-02 DIAGNOSIS — Z888 Allergy status to other drugs, medicaments and biological substances status: Secondary | ICD-10-CM

## 2017-10-02 DIAGNOSIS — E119 Type 2 diabetes mellitus without complications: Secondary | ICD-10-CM | POA: Diagnosis present

## 2017-10-02 DIAGNOSIS — E785 Hyperlipidemia, unspecified: Secondary | ICD-10-CM | POA: Diagnosis present

## 2017-10-02 DIAGNOSIS — Z79899 Other long term (current) drug therapy: Secondary | ICD-10-CM

## 2017-10-02 DIAGNOSIS — Z91018 Allergy to other foods: Secondary | ICD-10-CM

## 2017-10-02 DIAGNOSIS — I110 Hypertensive heart disease with heart failure: Secondary | ICD-10-CM | POA: Diagnosis present

## 2017-10-02 DIAGNOSIS — Z7952 Long term (current) use of systemic steroids: Secondary | ICD-10-CM

## 2017-10-02 HISTORY — DX: Heart failure, unspecified: I50.9

## 2017-10-02 HISTORY — DX: Depression, unspecified: F32.A

## 2017-10-02 HISTORY — DX: Major depressive disorder, single episode, unspecified: F32.9

## 2017-10-02 LAB — CBC
HCT: 40.1 % (ref 35.0–47.0)
HEMOGLOBIN: 12.8 g/dL (ref 12.0–16.0)
MCH: 28.3 pg (ref 26.0–34.0)
MCHC: 31.8 g/dL — ABNORMAL LOW (ref 32.0–36.0)
MCV: 88.9 fL (ref 80.0–100.0)
PLATELETS: 438 10*3/uL (ref 150–440)
RBC: 4.51 MIL/uL (ref 3.80–5.20)
RDW: 18.2 % — ABNORMAL HIGH (ref 11.5–14.5)
WBC: 9.6 10*3/uL (ref 3.6–11.0)

## 2017-10-02 LAB — TROPONIN I

## 2017-10-02 LAB — BASIC METABOLIC PANEL
ANION GAP: 13 (ref 5–15)
BUN: 7 mg/dL (ref 6–20)
CALCIUM: 9.9 mg/dL (ref 8.9–10.3)
CO2: 22 mmol/L (ref 22–32)
CREATININE: 0.74 mg/dL (ref 0.44–1.00)
Chloride: 104 mmol/L (ref 101–111)
Glucose, Bld: 85 mg/dL (ref 65–99)
Potassium: 3.6 mmol/L (ref 3.5–5.1)
SODIUM: 139 mmol/L (ref 135–145)

## 2017-10-02 LAB — PROTIME-INR
INR: 1.4
PROTHROMBIN TIME: 17 s — AB (ref 11.4–15.2)

## 2017-10-02 LAB — TSH: TSH: 2.84 u[IU]/mL (ref 0.350–4.500)

## 2017-10-02 MED ORDER — FUROSEMIDE 10 MG/ML IJ SOLN
20.0000 mg | Freq: Four times a day (QID) | INTRAMUSCULAR | Status: AC
  Administered 2017-10-02 – 2017-10-03 (×2): 20 mg via INTRAVENOUS
  Filled 2017-10-02 (×2): qty 2

## 2017-10-02 MED ORDER — ADULT MULTIVITAMIN W/MINERALS CH
1.0000 | ORAL_TABLET | Freq: Every day | ORAL | Status: DC
Start: 2017-10-03 — End: 2017-10-04
  Administered 2017-10-03 – 2017-10-04 (×2): 1 via ORAL
  Filled 2017-10-02 (×4): qty 1

## 2017-10-02 MED ORDER — ASPIRIN EC 81 MG PO TBEC
81.0000 mg | DELAYED_RELEASE_TABLET | Freq: Every day | ORAL | Status: DC
  Administered 2017-10-03 – 2017-10-04 (×2): 81 mg via ORAL
  Filled 2017-10-02: qty 1

## 2017-10-02 MED ORDER — IPRATROPIUM-ALBUTEROL 0.5-2.5 (3) MG/3ML IN SOLN
RESPIRATORY_TRACT | Status: AC
  Filled 2017-10-02: qty 3

## 2017-10-02 MED ORDER — MECLIZINE HCL 25 MG PO TABS
25.0000 mg | ORAL_TABLET | Freq: Two times a day (BID) | ORAL | Status: DC | PRN
  Filled 2017-10-02: qty 1

## 2017-10-02 MED ORDER — FUROSEMIDE 40 MG PO TABS: 40.0000 mg | ORAL_TABLET | Freq: Every day | ORAL | Status: DC

## 2017-10-02 MED ORDER — VANCOMYCIN HCL 10 G IV SOLR
1500.0000 mg | Freq: Two times a day (BID) | INTRAVENOUS | Status: DC
  Administered 2017-10-02 – 2017-10-03 (×2): 1500 mg via INTRAVENOUS
  Filled 2017-10-02 (×5): qty 1500

## 2017-10-02 MED ORDER — ACETAMINOPHEN 650 MG RE SUPP: 650.0000 mg | Freq: Four times a day (QID) | RECTAL | Status: DC | PRN

## 2017-10-02 MED ORDER — CARVEDILOL 3.125 MG PO TABS
3.1250 mg | ORAL_TABLET | Freq: Two times a day (BID) | ORAL | Status: DC
  Administered 2017-10-02 – 2017-10-04 (×4): 3.125 mg via ORAL
  Filled 2017-10-02 (×4): qty 1

## 2017-10-02 MED ORDER — FUROSEMIDE 10 MG/ML IJ SOLN
20.0000 mg | INTRAMUSCULAR | Status: AC
  Administered 2017-10-02: 20 mg via INTRAVENOUS
  Filled 2017-10-02: qty 4

## 2017-10-02 MED ORDER — PANTOPRAZOLE SODIUM 40 MG PO TBEC
40.0000 mg | DELAYED_RELEASE_TABLET | Freq: Two times a day (BID) | ORAL | Status: DC
  Administered 2017-10-02 – 2017-10-04 (×4): 40 mg via ORAL
  Filled 2017-10-02 (×4): qty 1

## 2017-10-02 MED ORDER — VERAPAMIL HCL ER 120 MG PO TBCR
120.0000 mg | EXTENDED_RELEASE_TABLET | Freq: Three times a day (TID) | ORAL | Status: DC
  Administered 2017-10-02 – 2017-10-04 (×5): 120 mg via ORAL
  Filled 2017-10-02 (×7): qty 1

## 2017-10-02 MED ORDER — LEVOCETIRIZINE DIHYDROCHLORIDE 5 MG PO TABS: 5.0000 mg | ORAL_TABLET | Freq: Every evening | ORAL | Status: DC

## 2017-10-02 MED ORDER — DOCUSATE SODIUM 100 MG PO CAPS
100.0000 mg | ORAL_CAPSULE | Freq: Two times a day (BID) | ORAL | Status: DC
  Administered 2017-10-02 – 2017-10-04 (×4): 100 mg via ORAL
  Filled 2017-10-02 (×4): qty 1

## 2017-10-02 MED ORDER — IPRATROPIUM-ALBUTEROL 0.5-2.5 (3) MG/3ML IN SOLN
3.0000 mL | Freq: Once | RESPIRATORY_TRACT | Status: AC
Start: 2017-10-02 — End: 2017-10-02
  Administered 2017-10-02: 3 mL via RESPIRATORY_TRACT

## 2017-10-02 MED ORDER — TRAMADOL HCL 50 MG PO TABS: 50.0000 mg | ORAL_TABLET | Freq: Four times a day (QID) | ORAL | Status: DC | PRN

## 2017-10-02 MED ORDER — DEXTROSE 5 % IV SOLN
1.0000 g | Freq: Once | INTRAVENOUS | Status: AC
  Administered 2017-10-02: 1 g via INTRAVENOUS
  Filled 2017-10-02: qty 1

## 2017-10-02 MED ORDER — CETIRIZINE HCL 10 MG PO TABS
10.0000 mg | ORAL_TABLET | Freq: Every day | ORAL | Status: DC
  Administered 2017-10-02 – 2017-10-03 (×2): 10 mg via ORAL
  Filled 2017-10-02 (×3): qty 1

## 2017-10-02 MED ORDER — BIOTIN 5000 MCG PO TABS: 1.0000 | ORAL_TABLET | Freq: Every day | ORAL | Status: DC

## 2017-10-02 MED ORDER — VANCOMYCIN HCL IN DEXTROSE 1-5 GM/200ML-% IV SOLN
1000.0000 mg | Freq: Once | INTRAVENOUS | Status: AC
  Administered 2017-10-02: 1000 mg via INTRAVENOUS
  Filled 2017-10-02: qty 200

## 2017-10-02 MED ORDER — FLUTICASONE PROPIONATE 50 MCG/ACT NA SUSP
2.0000 | Freq: Every day | NASAL | Status: DC
  Filled 2017-10-02: qty 16

## 2017-10-02 MED ORDER — ENOXAPARIN SODIUM 100 MG/ML ~~LOC~~ SOLN
1.0000 mg/kg | Freq: Once | SUBCUTANEOUS | Status: AC
  Administered 2017-10-02: 100 mg via SUBCUTANEOUS
  Filled 2017-10-02: qty 1

## 2017-10-02 MED ORDER — DIAZEPAM 5 MG PO TABS: 5.0000 mg | ORAL_TABLET | Freq: Three times a day (TID) | ORAL | Status: DC | PRN

## 2017-10-02 MED ORDER — ONDANSETRON HCL 4 MG/2ML IJ SOLN: 4.0000 mg | Freq: Four times a day (QID) | INTRAMUSCULAR | Status: DC | PRN

## 2017-10-02 MED ORDER — HYDRALAZINE HCL 50 MG PO TABS
100.0000 mg | ORAL_TABLET | Freq: Two times a day (BID) | ORAL | Status: DC
  Administered 2017-10-02 – 2017-10-04 (×4): 100 mg via ORAL
  Filled 2017-10-02 (×4): qty 2

## 2017-10-02 MED ORDER — HYDROCODONE-ACETAMINOPHEN 5-325 MG PO TABS
1.0000 | ORAL_TABLET | Freq: Two times a day (BID) | ORAL | Status: DC | PRN
  Administered 2017-10-02 – 2017-10-03 (×3): 1 via ORAL
  Filled 2017-10-02 (×3): qty 1

## 2017-10-02 MED ORDER — LINACLOTIDE 290 MCG PO CAPS
290.0000 ug | ORAL_CAPSULE | Freq: Every day | ORAL | Status: DC
  Administered 2017-10-04: 09:00:00 290 ug via ORAL
  Filled 2017-10-02 (×2): qty 1

## 2017-10-02 MED ORDER — POTASSIUM CHLORIDE CRYS ER 20 MEQ PO TBCR
20.0000 meq | EXTENDED_RELEASE_TABLET | Freq: Every day | ORAL | Status: DC
  Administered 2017-10-03: 20 meq via ORAL
  Filled 2017-10-02: qty 1

## 2017-10-02 MED ORDER — ONDANSETRON HCL 4 MG PO TABS: 4.0000 mg | ORAL_TABLET | Freq: Four times a day (QID) | ORAL | Status: DC | PRN

## 2017-10-02 MED ORDER — VITAMIN B COMPLEX PO TABS: 1.0000 | ORAL_TABLET | Freq: Every day | ORAL | Status: DC

## 2017-10-02 MED ORDER — PRAVASTATIN SODIUM 20 MG PO TABS
20.0000 mg | ORAL_TABLET | Freq: Every evening | ORAL | Status: DC
  Administered 2017-10-02 – 2017-10-03 (×2): 20 mg via ORAL
  Filled 2017-10-02 (×2): qty 1

## 2017-10-02 MED ORDER — CHLORHEXIDINE GLUCONATE 0.12 % MT SOLN
15.0000 mL | Freq: Two times a day (BID) | OROMUCOSAL | Status: DC
  Administered 2017-10-03 – 2017-10-04 (×4): 15 mL via OROMUCOSAL
  Filled 2017-10-02 (×4): qty 15

## 2017-10-02 MED ORDER — ALBUTEROL SULFATE (2.5 MG/3ML) 0.083% IN NEBU
2.5000 mg | INHALATION_SOLUTION | RESPIRATORY_TRACT | Status: DC
  Administered 2017-10-02 – 2017-10-04 (×10): 2.5 mg via RESPIRATORY_TRACT
  Filled 2017-10-02 (×10): qty 3

## 2017-10-02 MED ORDER — CYCLOBENZAPRINE HCL 10 MG PO TABS
5.0000 mg | ORAL_TABLET | Freq: Every day | ORAL | Status: DC
  Administered 2017-10-03: 21:00:00 5 mg via ORAL
  Filled 2017-10-02: qty 1

## 2017-10-02 MED ORDER — ACETAMINOPHEN 325 MG PO TABS: 650.0000 mg | ORAL_TABLET | Freq: Four times a day (QID) | ORAL | Status: DC | PRN

## 2017-10-02 MED ORDER — B COMPLEX-C PO TABS
1.0000 | ORAL_TABLET | Freq: Every day | ORAL | Status: DC
  Administered 2017-10-03 – 2017-10-04 (×2): 1 via ORAL
  Filled 2017-10-02 (×2): qty 1

## 2017-10-02 MED ORDER — CARVEDILOL 3.125 MG PO TABS: 1.0000 mg | ORAL_TABLET | Freq: Two times a day (BID) | ORAL | Status: DC

## 2017-10-02 NOTE — ED Provider Notes (Signed)
[facility] Emergency Department Provider Note  ____________________________________________   I have reviewed the triage vital signs and the nursing notes.   HISTORY  Chief Complaint Shortness of Breath    HPI Shelly Huynh is a 46 y.o. female who presents today complaining of shortness of breath.  She has very comp gated medical history including mechanical heart valve replacement, on Coumadin, which had to be reduced recently because of the surgery and her decreased vitamin K intake, as well as gastric sleeve surgery which was performed on the 30th with discharge on the fifth.  Patient did have a therapeutic INR at that time.  Patient does not have a history of PE or DVT.  She has had a pneumothorax after surgery apparently.   Patient denies chest pain.  He states for the last few days she has had gradually increasing shortness of breath.  She has a slight cough.  She denies any fever or chills.  No leg swelling.  No personal or family history of DVT or PE.  No exertional symptoms..  [name] compliant with her medication.   Past Medical History:  Diagnosis Date  . Acid reflux   . Anxiety   . Arthritis   . Diabetes mellitus without complication ([place])   . Heart valve replaced   . High cholesterol   . High cholesterol   . Hypertension   . Hypokalemia   . IBS (irritable bowel syndrome)   . Sciatic pain     Patient Active Problem List   Diagnosis Date Noted  . Tobacco abuse 09/19/2016  . Bilateral leg edema 08/23/2016  . Osteoarthritis of spine with radiculopathy, lumbar region 08/08/2016  . Diabetes mellitus type 2, diet-controlled ([place]) 05/17/2016  . Vitamin D deficiency 05/17/2016  . Gastroesophageal reflux disease without esophagitis 10/12/2015  . Spondylosis of lumbar region without myelopathy or radiculopathy 10/12/2015  . Adiposity 06/30/2015  . Compulsive tobacco user syndrome 06/30/2015  . Chronic hypokalemia 06/15/2015  . Palpitations  05/18/2015  . Chronic anticoagulation 05/07/2015  . Benign essential HTN 05/05/2015  . Absolute anemia 04/26/2015  . Acute on chronic combined systolic and diastolic congestive heart failure ([place]) 04/08/2015  . H/O aortic valve replacement 04/07/2015  . H/O heart valve replacement with mechanical valve 04/07/2015  . H/O prosthetic heart valve 04/07/2015  . 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 Acute right heart failure ([place]) 04/04/2015  . History of open heart surgery 04/03/2015  . H/O mitral valve replacement with mechanical valve 04/03/2015  . 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 Acute respiratory failure with hypoxemia ([place]) 03/05/2015  . Coronary artery disease due to calcified coronary lesion 03/04/2015  . Obstructive apnea 10/28/2014  . Enlarged heart 10/28/2014  . Combined fat and carbohydrate induced hyperlipemia 09/11/2014    Past Surgical History:  Procedure Laterality Date  . ABDOMINAL HYSTERECTOMY    . BREAST BIOPSY Left 07/25/2017   path pending  . BREAST EXCISIONAL BIOPSY Right 1992   NEG  . CARDIAC VALVE SURGERY    . CESAREAN SECTION    . HERNIA REPAIR      Prior to Admission medications   Medication Sig Start Date End Date Taking? Authorizing Provider  albuterol (PROAIR HFA) 108 (90 BASE) MCG/ACT inhaler Inhale into the lungs. 08/06/14   [provider]  amoxicillin (AMOXIL) 500 MG capsule Take 2,000 mg by mouth as needed (for dental procedure).  01/20/15   [provider]  benzonatate (TESSALON PERLES) 100 MG capsule Take  1 capsule (100 mg total) by mouth every 6 (six) hours as needed for cough. 06/12/17 06/12/18  Nance Pear, MD  buPROPion (WELLBUTRIN) 100 MG tablet Take by mouth.    [provider]  carvedilol (COREG) 3.125 MG tablet Take by mouth. 06/22/15 10/07/16  [provider]  cyclobenzaprine (FLEXERIL) 5 MG tablet Take 5 mg by mouth at bedtime.  01/13/15   [provider]  diazepam  (VALIUM) 5 MG tablet Take by mouth.    [provider]  fluticasone (FLONASE) 50 MCG/ACT nasal spray Place into the nose. 07/21/15 07/20/16  [provider]  furosemide (LASIX) 40 MG tablet Take by mouth. 07/03/15 10/01/15  [provider]  hydrALAZINE (APRESOLINE) 25 MG tablet Take by mouth. 05/30/15   [provider]  HYDROcodone-acetaminophen (NORCO/VICODIN) 5-325 MG per tablet Take 1 tablet by mouth 2 (two) times daily as needed for moderate pain.    [provider]  levalbuterol Penne Lash) 1.25 MG/3ML nebulizer solution Inhale into the lungs. 02/08/16 02/07/17  [provider]  Linaclotide Rolan Lipa) 290 MCG CAPS capsule Take by mouth. 05/04/15   [provider]  loratadine (CLARITIN) 10 MG tablet Take by mouth. 07/21/15 07/20/16  [provider]  meclizine (ANTIVERT) 25 MG tablet Take by mouth. 05/11/15   [provider]  pantoprazole (PROTONIX) 40 MG tablet Take 1 tablet by mouth two  times daily 06/30/16   [provider]  potassium chloride (MICRO-K) 10 MEQ CR capsule Take by mouth. 07/10/15 10/08/15  [provider]  pravastatin (PRAVACHOL) 20 MG tablet Take 20 mg by mouth daily.  12/07/14   [provider]  predniSONE (DELTASONE) 20 MG tablet Take 2 tablets (40 mg total) by mouth daily. 02/19/16   Sherwood Gambler, MD  traMADol (ULTRAM) 50 MG tablet Take by mouth. 08/09/16   [provider]  verapamil (CALAN-SR) 120 MG CR tablet Take by mouth. 06/15/15   [provider]  warfarin (COUMADIN) 7.5 MG tablet Take by mouth. 07/10/15   [provider]    Allergies Ivp dye [iodinated diagnostic agents]; Sulfa antibiotics; Banana; Guaifenesin; Hctz [hydrochlorothiazide]; Losartan; Povidone iodine; Povidone-iodine; Lisinopril; and Mucinex [guaifenesin er]  Family History  Problem Relation Age of Onset  . Breast cancer Paternal Grandmother     Social History Social History    Tobacco Use  . Smoking status: Former Smoker    Last attempt to quit: 03/08/2015    Years since quitting: 2.5  . Smokeless tobacco: Never Used  Substance Use Topics  . Alcohol use: No  . Drug use: No    Review of Systems Constitutional: No fever/chills Eyes: No visual changes. ENT: No sore throat. No stiff neck no neck pain Cardiovascular: Denies chest pain. Respiratory: Positive shortness of breath. Gastrointestinal:   no vomiting.  No diarrhea.  No constipation. Genitourinary: Negative for dysuria. Musculoskeletal: Negative lower extremity swelling Skin: Negative for rash. Neurological: Negative for severe headaches, focal weakness or numbness.   ____________________________________________   PHYSICAL EXAM:  VITAL SIGNS: ED Triage Vitals  Enc Vitals Group     BP 10/02/17 1127 (!) 164/88     Pulse Rate 10/02/17 1127 70     Resp 10/02/17 1127 (!) 24     Temp 10/02/17 1127 98 F (36.7 C)     Temp Source 10/02/17 1127 Oral     SpO2 10/02/17 1127 98 %     Weight 10/02/17 1122 220 lb (99.8 kg)     Height 10/02/17 1122 5' 2"  (1.575  m)     Head Circumference --      Peak Flow --      Pain Score 10/02/17 1120 5     Pain Loc --      Pain Edu? --      Excl. in [place]? --     Constitutional: Alert and oriented. Well appearing and in no acute distress. Eyes: Conjunctivae are normal Head: Atraumatic HEENT: No congestion/rhinnorhea. Mucous membranes are moist.  Oropharynx non-erythematous Neck:   Nontender with no meningismus, no masses, no stridor Cardiovascular: Normal rate, regular rhythm.  mechanical clicks appreciated.  Good peripheral circulation. Respiratory: Normal respiratory effort.  No retractions.  Lungs diminished in the base. Abdominal: Soft and nontender. No distention. No guarding no rebound Back:  There is no focal tenderness or step off.  there is no midline tenderness there are no lesions noted. there is no CVA tenderness Musculoskeletal: No lower  extremity tenderness, no upper extremity tenderness. No joint effusions, no DVT signs strong distal pulses no edema Neurologic:  Normal speech and language. No gross focal neurologic deficits are appreciated.  Skin:  Skin is warm, dry and intact. No rash noted. Psychiatric: Mood and affect are normal. Speech and behavior are normal.  ____________________________________________   LABS (all labs ordered are listed, but only abnormal results are displayed)  Labs Reviewed  CBC - Abnormal; Notable for the following components:      Result Value   MCHC 31.8 (*)    RDW 18.2 (*)    All other components within normal limits  PROTIME-INR - Abnormal; Notable for the following components:   Prothrombin Time 17.0 (*)    All other components within normal limits  BASIC METABOLIC PANEL  TROPONIN I    Pertinent labs  results that were available during my care of the patient were reviewed by me and considered in my medical decision making (see chart for details). ____________________________________________  EKG  I personally interpreted any EKGs ordered by me or triage Sinus rhythm at 69 bpm, no acute ST elevation depression nonspecific ST changes ____________________________________________  RADIOLOGY  Pertinent labs & imaging results that were available during my care of the patient were reviewed by me and considered in my medical decision making (see chart for details). If possible, patient and/or family made aware of any abnormal findings. ____________________________________________    PROCEDURES  Procedure(s) performed: None  Procedures  Critical Care performed: None  ____________________________________________   INITIAL IMPRESSION / ASSESSMENT AND PLAN / ED COURSE  Pertinent labs & imaging results that were available during my care of the patient were reviewed by me and considered in my medical decision making (see chart for details).  Patient here with shortness of  breath after surgery.  No clinical evidence of DVT.  Patient does have mechanical heart valve, her target INR is over 2.5 and she is subtherapeutic likely because of diminished dosing.  We are giving her Lovenox here to cover her while we work her up.  Chest x-ray shows questionable pneumonia however, she has had something of a cough however, I am concerned that other pathologies could be present given her recent history.  Patient truly anaphylaxis with tongue swelling to IV contrast and I do not think is amenable to that particular intervention declines in any event.  This does limit our ability to evaluate the patient's shortness of breath however, I will do a noncontrasted CT to verify the present or absence of pneumonia.  Patient will require significant anticoagulation irrespective of  the presence of a blood clot, making the decision tree somewhat truncated in this regard, in addition, a VQ scan likely would not be indicated given the presence of chest x-ray abnormalities.  We will also evaluate for effusion or other pathology that could certainly cause her.  Patient apparently did have a pneumothorax although clinically that does not appear to be likely chest x-ray does not support it.    ____________________________________________   FINAL CLINICAL IMPRESSION(S) / ED DIAGNOSES  Final diagnoses:  None      This chart was dictated using voice recognition software.  Despite best efforts to proofread,  errors can occur which can change meaning.      Schuyler Amor, MD 10/02/17 [phone]

## 2017-10-02 NOTE — ED Notes (Signed)
Per Dr. Cinda Quest okay to start abx after obtaining one set of cultures

## 2017-10-02 NOTE — ED Triage Notes (Addendum)
Pt c/o SHOB that started at 5 am today.  Had gastric sleeve surgery 09/19/17.  Has pneumothorax from hematoma r/t surgery. Felt OK when left hospital 09/25/17 but acute [facility] today. Mildly labored in triage.   Discussed with dr Burlene Arnt. Blue top sent but will wait to see if d dimer needed. Pt on coumadin for 3 artificial heart valve

## 2017-10-02 NOTE — H&P (Signed)
Shelly Huynh is an 46 y.o. female.   Chief Complaint: Shortness of breath HPI: The patient with past medical history of diabetes, mechanical heart valve, hypertension and obstructive sleep apnea who is postoperative day 13 from gastric sleeve operation presents to the emergency department complaining of shortness of breath.  The patient states that she awoke this morning feeling as if she might suffocate.  She also became incredibly dyspneic walking from her bed to the restroom.  She came to the emergency department for evaluation where she was found to have normal oxygen saturation on room air.  Chest x-ray showed a possible case of pneumonia within her right lung.  The patient takes Coumadin but is subtherapeutic secondary to reduced dosing prior to her surgery.  Due to her ongoing shortness of breath and labored breathing the patient underwent CT of the chest to rule out pulmonary embolism.  ([name] allergic to contrast dye thus no angiogram performed).  Imaging revealed significant right-sided pleural effusion as well as small left pleural effusion.  The patient received cefepime and vancomycin in the emergency department but was persistently tachypneic with ambulation which prompted the emergency department staff to call the hospitalist service for admission.  Past Medical History:  Diagnosis Date  . Acid reflux   . Anxiety   . Arthritis   . Diabetes mellitus without complication ([place])   . Heart valve replaced   . High cholesterol   . High cholesterol   . Hypertension   . Hypokalemia   . IBS (irritable bowel syndrome)   . Sciatic pain     Past Surgical History:  Procedure Laterality Date  . ABDOMINAL HYSTERECTOMY    . BREAST BIOPSY Left 07/25/2017   path pending  . BREAST EXCISIONAL BIOPSY Right 1992   NEG  . CARDIAC VALVE SURGERY    . CESAREAN SECTION    . HERNIA REPAIR      Family History  Problem Relation Age of Onset  . Breast cancer Paternal Grandmother    Social  History:  reports that she quit smoking about 2 years ago. she has never used smokeless tobacco. She reports that she does not drink alcohol or use drugs.  Allergies:  Allergies  Allergen Reactions  . Ivp Dye [Iodinated Diagnostic Agents] Anaphylaxis and Hives    Especially CT contrast media; tightness in chest  . Sulfa Antibiotics Hives and Anaphylaxis  . Banana Itching  . Guaifenesin Hives  . Hctz [Hydrochlorothiazide] Hives  . Losartan Itching  . Povidone Iodine Hives  . Povidone-Iodine Hives  . Lisinopril Hives and Rash  . Mucinex [Guaifenesin Er] Hives and Rash    Medications Prior to Admission  Medication Sig Dispense Refill  . aspirin EC 81 MG tablet Take 81 mg daily by mouth.    . B Complex Vitamins (VITAMIN B COMPLEX) TABS Take 1 tablet daily by mouth.    . Biotin 5000 MCG TABS Take 1 tablet daily by mouth.    . carvedilol (COREG) 3.125 MG tablet Take 3.125 mg 2 (two) times daily by mouth.     . fluticasone (FLONASE) 50 MCG/ACT nasal spray Place 2 sprays daily into the nose.     . furosemide (LASIX) 40 MG tablet Take by mouth.    . hydrALAZINE (APRESOLINE) 100 MG tablet Take 100 mg 2 (two) times daily by mouth.     Marland Kitchen HYDROcodone-acetaminophen (NORCO/VICODIN) 5-325 MG per tablet Take 1 tablet by mouth 2 (two) times daily as needed for moderate pain.    Marland Kitchen  levalbuterol (XOPENEX) 1.25 MG/3ML nebulizer solution Inhale 1.25 mg every 6 (six) hours as needed into the lungs.     Marland Kitchen levocetirizine (XYZAL) 5 MG tablet Take 1 tablet every evening by mouth.    . Multiple Vitamins-Minerals (MULTIVITAMIN WITH MINERALS) tablet Take 1 tablet daily by mouth.    . pantoprazole (PROTONIX) 40 MG tablet Take 1 tablet by mouth two  times daily    . potassium chloride (MICRO-K) 10 MEQ CR capsule Take by mouth.    . pravastatin (PRAVACHOL) 20 MG tablet Take 20 mg by mouth daily.     . verapamil (CALAN-SR) 120 MG CR tablet Take 120 mg 3 (three) times daily by mouth.     . warfarin (COUMADIN) 7.5 MG  tablet Take 4-9.5 mg by mouth.     Marland Kitchen albuterol (PROAIR HFA) 108 (90 BASE) MCG/ACT inhaler Inhale into the lungs.    Marland Kitchen amoxicillin (AMOXIL) 500 MG capsule Take 2,000 mg by mouth as needed (for dental procedure).     . benzonatate (TESSALON PERLES) 100 MG capsule Take 1 capsule (100 mg total) by mouth every 6 (six) hours as needed for cough. (Patient not taking: Reported on 10/02/2017) 30 capsule 0  . cyclobenzaprine (FLEXERIL) 5 MG tablet Take 5 mg by mouth at bedtime.     . diazepam (VALIUM) 5 MG tablet Take by mouth.    Marland Kitchen Linaclotide (LINZESS) 290 MCG CAPS capsule Take by mouth.    . meclizine (ANTIVERT) 25 MG tablet Take by mouth.    . traMADol (ULTRAM) 50 MG tablet Take by mouth.      Results for orders placed or performed during the hospital encounter of 10/02/17 (from the past 48 hour(s))  Basic metabolic panel     Status: None   Collection Time: 10/02/17 11:21 AM  Result Value Ref Range   Sodium 139 135 - 145 mmol/L   Potassium 3.6 3.5 - 5.1 mmol/L    Comment: HEMOLYSIS AT THIS LEVEL MAY AFFECT RESULT   Chloride 104 101 - 111 mmol/L   CO2 22 22 - 32 mmol/L   Glucose, Bld 85 65 - 99 mg/dL   BUN 7 6 - 20 mg/dL   Creatinine, Ser 0.74 0.44 - 1.00 mg/dL   Calcium 9.9 8.9 - 10.3 mg/dL   GFR calc non Af Amer >60 >60 mL/min   GFR calc Af Amer >60 >60 mL/min    Comment: (NOTE) The eGFR has been calculated using the CKD EPI equation. This calculation has not been validated in all clinical situations. eGFR's persistently <60 mL/min signify possible Chronic Kidney Disease.    Anion gap 13 5 - 15  CBC     Status: Abnormal   Collection Time: 10/02/17 11:21 AM  Result Value Ref Range   WBC 9.6 3.6 - 11.0 K/uL   RBC 4.51 3.80 - 5.20 MIL/uL   Hemoglobin 12.8 12.0 - 16.0 g/dL   HCT 40.1 35.0 - 47.0 %   MCV 88.9 80.0 - 100.0 fL   MCH 28.3 26.0 - 34.0 pg   MCHC 31.8 (L) 32.0 - 36.0 g/dL   RDW 18.2 (H) 11.5 - 14.5 %   Platelets 438 150 - 440 K/uL  Troponin I     Status: None    Collection Time: 10/02/17 11:21 AM  Result Value Ref Range   Troponin I <0.03 <0.03 ng/mL  Protime-INR     Status: Abnormal   Collection Time: 10/02/17 11:21 AM  Result Value Ref Range   Prothrombin Time  17.0 (H) 11.4 - 15.2 seconds   INR 1.40    Dg Chest 2 View  Result Date: 10/02/2017 CLINICAL DATA:  Shortness of breath and mid chest pain since 5 a.m. Former smoker. EXAM: CHEST  2 VIEW COMPARISON:  06/12/2017. FINDINGS: The heart is enlarged. Prior median sternotomy for valve replacement. Borderline pulmonary edema. Increasing opacity at the RIGHT base, elevated RIGHT hemidiaphragm with volume loss, suspicious for RIGHT lower lobe pneumonia. No osseous findings. IMPRESSION: Cardiomegaly. Borderline edema. Increasing opacity at the RIGHT base. Correlate clinically for pneumonia. Electronically Signed   By: Staci Righter M.D.   On: 10/02/2017 12:08   Ct Chest Wo Contrast  Result Date: 10/02/2017 CLINICAL DATA:  Dyspnea. Shortness of breath. Recent gastric sleeve surgery EXAM: CT CHEST WITHOUT CONTRAST TECHNIQUE: Multidetector CT imaging of the chest was performed following the standard protocol without IV contrast. COMPARISON:  None. FINDINGS: Cardiovascular: Previous median sternotomy and CABG procedure. Moderate cardiac enlargement. No pericardial effusion. Mediastinum/Nodes: The trachea appears patent and is midline. Prominent bilateral mediastinal lymph nodes are identified. Index right paratracheal node measures 1.2 cm, image 44 of series 2. Index pre vascular lymph node measures 1.4 cm, image 47 of series 2. Right cardio phrenic angle noted measures 1 cm, image 99 of series 2. Lungs/Pleura: Moderate bilateral pleural scratch set there is a moderate to large right pleural effusion identified. Small to moderate left pleural effusion noted. Subsegmental atelectasis within the right middle lobe and anterior basal right upper lobe identified. Atelectasis and/or consolidation within the right lower  lobe identified. Calcified granuloma within the left lower lobe identified. Upper Abdomen: There is a small volume of ascites identified overlying the liver. Postsurgical changes within the left upper quadrant of the abdomen are incompletely visualized and difficult to characterize. Musculoskeletal: No chest wall mass or suspicious bone lesions identified. Scoliosis deformity is identified. The thoracic spine is convex towards the left. IMPRESSION: 1. Bilateral pleural effusions are identified right greater in left. 2. There is subsegmental atelectasis within the anterior basal right upper lobe and in the right middle lobe. Atelectasis and/or consolidation noted in the right lower lobe. 3. Nonspecific mediastinal lymph node enlargement. The largest node is in the prevascular region measuring 1.4 cm. 4. Small volume of ascites noted within the upper abdomen. 5. Postsurgical changes from recent gastric sleeve surgery partially visualized. Difficult to evaluate without intravenous and oral contrast material. 6. Cardiac enlargement.  Status post CABG procedure. Electronically Signed   By: Kerby Moors M.D.   On: 10/02/2017 15:06    Review of Systems  Constitutional: Negative for chills and fever.  HENT: Negative for sore throat and tinnitus.   Eyes: Negative for blurred vision and redness.  Respiratory: Positive for cough and shortness of breath.   Cardiovascular: Positive for orthopnea. Negative for chest pain, palpitations and PND.  Gastrointestinal: Negative for abdominal pain, diarrhea, nausea and vomiting.  Genitourinary: Negative for dysuria, frequency and urgency.  Musculoskeletal: Negative for joint pain and myalgias.  Skin: Negative for rash.       No lesions  Neurological: Negative for speech change, focal weakness and weakness.  Endo/Heme/Allergies: Does not bruise/bleed easily.       No temperature intolerance  Psychiatric/Behavioral: Negative for depression and suicidal ideas.    Blood  pressure (!) 164/82, pulse 62, temperature 98 F (36.7 C), temperature source Oral, resp. rate (!) 22, height 5' 2" (1.575 m), weight 99.8 kg (220 lb), SpO2 98 %. Physical Exam  Vitals reviewed. Constitutional: [name] oriented  to person, place, and time. She appears well-developed and well-nourished. No distress.  HENT:  Head: Normocephalic and atraumatic.  Mouth/Throat: Oropharynx is clear and moist.  Eyes: Conjunctivae and EOM are normal. Pupils are equal, round, and reactive to light. No scleral icterus.  Neck: Normal range of motion. Neck supple. No JVD present. No tracheal deviation present. No thyromegaly present.  Cardiovascular: Normal rate, regular rhythm and normal heart sounds. Exam reveals no gallop and no friction rub.  No murmur heard. Respiratory: Effort normal. Tachypnea noted. She has decreased breath sounds in the right middle field and the right lower field.  GI: Soft. Bowel sounds are normal. She exhibits no distension. There is no tenderness.  Genitourinary:  Genitourinary Comments: Deferred  Musculoskeletal: Normal range of motion. She exhibits no edema.  Lymphadenopathy:    She has no cervical adenopathy.  Neurological: [name] alert and oriented to person, place, and time. No cranial nerve deficit. She exhibits normal muscle tone.  Skin: Skin is warm and dry. No rash noted. No erythema.  Psychiatric: She has a normal mood and affect. Her behavior is normal. Judgment and thought content normal.     Assessment/Plan This is a 46 year old female admitted for pleural effusions. 1.  Pleural effusions: With tachypnea during ambulation.  Etiology is unclear.  I have made the patient n.p.o. after midnight inhaled anticoagulation in anticipation of thoracentesis in the morning.  Laying supine causes orthopnea but the patient has no hypoxia at this time.  IV Lasix every 6 hours for now.  May resume Lasix 40 mg p.o. per home regimen tomorrow following thoracentesis. 2.   Healthcare associated pneumonia: The patient has received cefepime and vancomycin in the emergency department.  She does not meet criteria for sepsis.  Kidney function is good and no hypoxia present.  Transition to oral antibiotics. 3.  Hypertension: Uncontrolled; continue Coreg, hydralazine and verapamil.  Labetalol as needed. 4.  Long-term anticoagulation: Secondary to mechanical heart valve.  The patient's INR is subtherapeutic at this time at 1.4 as her Coumadin was decreased prior to recent surgery.  I have continue to hold her Coumadin for possible thoracentesis.  She has been bridged with Lovenox in the emergency department.  Continue aspirin. 5.  Hyperlipidemia: Continue statin therapy 6.  DVT prophylaxis: As above 7.  GI prophylaxis: Resolved per home regimen It is a full code.  Time spent on admission orders and patient care approximately 45 minutes  Harrie Foreman, MD 10/02/2017, 6:16 PM

## 2017-10-02 NOTE — ED Notes (Signed)
Ambulated pt in hall, pt tachypenic RR 30 while ambulating, o2 sat 94% on RA, Dr. Marcille Blanco aware

## 2017-10-02 NOTE — Progress Notes (Signed)
PHARMACIST - PHYSICIAN ORDER COMMUNICATION  CONCERNING: P&T Medication Policy on Herbal Medications  DESCRIPTION:  This patient's order for:  biotin  has been noted.  This product(s) is classified as an "herbal" or natural product. Due to a lack of definitive safety studies or FDA approval, nonstandard manufacturing practices, plus the potential risk of unknown drug-drug interactions while on inpatient medications, the Pharmacy and Therapeutics Committee does not permit the use of "herbal" or natural products of this type within [facility].   ACTION TAKEN: The pharmacy department is unable to verify this order at this time Please reevaluate patient's clinical condition at discharge and address if the herbal or natural product(s) should be resumed at that time.   

## 2017-10-02 NOTE — ED Notes (Signed)
Patient transported to CT 

## 2017-10-02 NOTE — ED Notes (Signed)
Pt resting in bed, family at bedside, denies any needs, aware of pending admission

## 2017-10-02 NOTE — ED Notes (Signed)
EDP at bedside  

## 2017-10-02 NOTE — Progress Notes (Addendum)
Pharmacy Antibiotic Note  Shelly Huynh is a 46 y.o. female admitted on 10/02/2017 with pneumonia.  Pharmacy has been consulted for vancomycin dosing.  Plan: Vancomycin 1580m IV every 12 hours.  Goal trough 15-20 mcg/mL. Vancomycin trough 11/14@0930   Height: 5' 2"  (157.5 cm) Weight: 220 lb (99.8 kg) IBW/kg (Calculated) : 50.1  Temp (24hrs), Avg:98 F (36.7 C), Min:98 F (36.7 C), Max:98 F (36.7 C)  Recent Labs  Lab 10/02/17 1121  WBC 9.6  CREATININE 0.74    Estimated Creatinine Clearance: 96.1 mL/min (by C-G formula based on SCr of 0.74 mg/dL).    Allergies  Allergen Reactions  . Ivp Dye [Iodinated Diagnostic Agents] Anaphylaxis and Hives    Especially CT contrast media; tightness in chest  . Sulfa Antibiotics Hives and Anaphylaxis  . Banana Itching  . Guaifenesin Hives  . Hctz [Hydrochlorothiazide] Hives  . Losartan Itching  . Povidone Iodine Hives  . Povidone-Iodine Hives  . Lisinopril Hives and Rash  . Mucinex [Guaifenesin Er] Hives and Rash    Antimicrobials this admission: Anti-infectives (From admission, onward)   Start     Dose/Rate Route Frequency Ordered Stop   10/02/17 2200  vancomycin (VANCOCIN) 1,500 mg in sodium chloride 0.9 % 500 mL IVPB     1,500 mg 250 mL/hr over 120 Minutes Intravenous Every 12 hours 10/02/17 1623     10/02/17 1630  vancomycin (VANCOCIN) IVPB 1000 mg/200 mL premix     1,000 mg 200 mL/hr over 60 Minutes Intravenous  Once 10/02/17 1619     10/02/17 1545  ceFEPIme (MAXIPIME) 1 g in dextrose 5 % 50 mL IVPB     1 g 100 mL/hr over 30 Minutes Intravenous  Once 10/02/17 1530       Microbiology results: No results found for this or any previous visit (from the past 240 hour(s)).   Thank you for allowing pharmacy to be a part of this patient's care.  GDonna ChristenCoffee 10/02/2017 4:23 PM

## 2017-10-02 NOTE — ED Notes (Signed)
Pt returned from CT, resting in bed, family at bedside

## 2017-10-03 ENCOUNTER — Observation Stay: Payer: Managed Care, Other (non HMO)

## 2017-10-03 DIAGNOSIS — J189 Pneumonia, unspecified organism: Secondary | ICD-10-CM | POA: Diagnosis present

## 2017-10-03 DIAGNOSIS — I5033 Acute on chronic diastolic (congestive) heart failure: Secondary | ICD-10-CM | POA: Diagnosis present

## 2017-10-03 DIAGNOSIS — Z91018 Allergy to other foods: Secondary | ICD-10-CM | POA: Diagnosis not present

## 2017-10-03 DIAGNOSIS — Y95 Nosocomial condition: Secondary | ICD-10-CM | POA: Diagnosis present

## 2017-10-03 DIAGNOSIS — Z87891 Personal history of nicotine dependence: Secondary | ICD-10-CM | POA: Diagnosis not present

## 2017-10-03 DIAGNOSIS — I2510 Atherosclerotic heart disease of native coronary artery without angina pectoris: Secondary | ICD-10-CM | POA: Diagnosis present

## 2017-10-03 DIAGNOSIS — Z888 Allergy status to other drugs, medicaments and biological substances status: Secondary | ICD-10-CM | POA: Diagnosis not present

## 2017-10-03 DIAGNOSIS — E785 Hyperlipidemia, unspecified: Secondary | ICD-10-CM | POA: Diagnosis present

## 2017-10-03 DIAGNOSIS — Z882 Allergy status to sulfonamides status: Secondary | ICD-10-CM | POA: Diagnosis not present

## 2017-10-03 DIAGNOSIS — Z7901 Long term (current) use of anticoagulants: Secondary | ICD-10-CM | POA: Diagnosis not present

## 2017-10-03 DIAGNOSIS — Z7952 Long term (current) use of systemic steroids: Secondary | ICD-10-CM | POA: Diagnosis not present

## 2017-10-03 DIAGNOSIS — K589 Irritable bowel syndrome without diarrhea: Secondary | ICD-10-CM | POA: Diagnosis present

## 2017-10-03 DIAGNOSIS — E119 Type 2 diabetes mellitus without complications: Secondary | ICD-10-CM | POA: Diagnosis present

## 2017-10-03 DIAGNOSIS — Z91041 Radiographic dye allergy status: Secondary | ICD-10-CM | POA: Diagnosis not present

## 2017-10-03 DIAGNOSIS — J90 Pleural effusion, not elsewhere classified: Secondary | ICD-10-CM | POA: Diagnosis present

## 2017-10-03 DIAGNOSIS — I110 Hypertensive heart disease with heart failure: Secondary | ICD-10-CM | POA: Diagnosis present

## 2017-10-03 DIAGNOSIS — Z79899 Other long term (current) drug therapy: Secondary | ICD-10-CM | POA: Diagnosis not present

## 2017-10-03 DIAGNOSIS — Z952 Presence of prosthetic heart valve: Secondary | ICD-10-CM | POA: Diagnosis not present

## 2017-10-03 LAB — AMYLASE, PLEURAL OR PERITONEAL FLUID: Amylase, Fluid: 37 U/L

## 2017-10-03 LAB — GLUCOSE, PLEURAL OR PERITONEAL FLUID: Glucose, Fluid: 78 mg/dL

## 2017-10-03 LAB — BODY FLUID CELL COUNT WITH DIFFERENTIAL
Eos, Fluid: 7 %
Lymphs, Fluid: 34 %
Monocyte-Macrophage-Serous Fluid: 4 %
Neutrophil Count, Fluid: 55 %
Total Nucleated Cell Count, Fluid: 2043 cu mm

## 2017-10-03 LAB — PROTEIN, PLEURAL OR PERITONEAL FLUID: Total protein, fluid: 4.6 g/dL

## 2017-10-03 LAB — ALBUMIN, PLEURAL OR PERITONEAL FLUID: Albumin, Fluid: 2.5 g/dL

## 2017-10-03 LAB — LACTATE DEHYDROGENASE, PLEURAL OR PERITONEAL FLUID: LD, Fluid: 662 U/L — ABNORMAL HIGH (ref 3–23)

## 2017-10-03 LAB — MRSA PCR SCREENING: MRSA BY PCR: NEGATIVE

## 2017-10-03 LAB — PATHOLOGIST SMEAR REVIEW

## 2017-10-03 MED ORDER — WARFARIN SODIUM 7.5 MG PO TABS
7.5000 mg | ORAL_TABLET | Freq: Once | ORAL | Status: AC
  Administered 2017-10-03: 7.5 mg via ORAL
  Filled 2017-10-03: qty 1

## 2017-10-03 MED ORDER — PIPERACILLIN-TAZOBACTAM 3.375 G IVPB
3.3750 g | Freq: Three times a day (TID) | INTRAVENOUS | Status: DC
  Administered 2017-10-03 – 2017-10-04 (×4): 3.375 g via INTRAVENOUS
  Filled 2017-10-03 (×4): qty 50

## 2017-10-03 MED ORDER — ENOXAPARIN SODIUM 100 MG/ML ~~LOC~~ SOLN
1.0000 mg/kg | Freq: Two times a day (BID) | SUBCUTANEOUS | Status: DC
  Administered 2017-10-03 – 2017-10-04 (×2): 100 mg via SUBCUTANEOUS
  Filled 2017-10-03 (×3): qty 1

## 2017-10-03 MED ORDER — WARFARIN - PHARMACIST DOSING INPATIENT
Freq: Every day | Status: DC
  Administered 2017-10-03: 18:00:00

## 2017-10-03 MED ORDER — FUROSEMIDE 10 MG/ML IJ SOLN
40.0000 mg | Freq: Every day | INTRAMUSCULAR | Status: DC
  Administered 2017-10-03: 40 mg via INTRAVENOUS
  Filled 2017-10-03 (×2): qty 4

## 2017-10-03 NOTE — Progress Notes (Signed)
ANTICOAGULATION CONSULT NOTE - Initial Consult  Pharmacy Consult for warfarin Indication: Mechanical aortic and mitral valve  Allergies  Allergen Reactions  . Ivp Dye [Iodinated Diagnostic Agents] Anaphylaxis and Hives    Especially CT contrast media; tightness in chest  . Sulfa Antibiotics Hives and Anaphylaxis  . Banana Itching  . Guaifenesin Hives  . Hctz [Hydrochlorothiazide] Hives  . Losartan Itching  . Povidone Iodine Hives  . Povidone-Iodine Hives  . Lisinopril Hives and Rash  . Mucinex [Guaifenesin Er] Hives and Rash    Patient Measurements: Height: 5' 2"  (157.5 cm) Weight: 215 lb 9.6 oz (97.8 kg) IBW/kg (Calculated) : 50.1  Vital Signs: Temp: 98.4 F (36.9 C) (11/13 1304) Temp Source: Oral (11/13 1304) BP: 121/77 (11/13 1304) Pulse Rate: 69 (11/13 1304)  Labs: Recent Labs    10/02/17 1121  HGB 12.8  HCT 40.1  PLT 438  LABPROT 17.0*  INR 1.40  CREATININE 0.74  TROPONINI <0.03    Estimated Creatinine Clearance: 95 mL/min (by C-G formula based on SCr of 0.74 mg/dL).  Assessment: Pharmacy consulted to dose and monitor warfarin in this 46 year old female who was taking warfarin prior to admission for a mechanical aortic and mitral valve.   Patient reports prior to her recent surgery she was taking warfarin 9.5 mg PO daily, but recently has been taking warfarin 2.5 mg PO Daily. INR = 1.4 is subtherapeutic on admission on current regimen.  Patient underwent thoracentesis on 11/13 - per MD warfarin should be resumed this evening with therapeutic enoxaparin bridge.  Dosing History Date INR Dose 11/12 1.40 11/13 -- 7.5 mg  Goal of Therapy:  INR 2.5 - 3.5 (confirmed with patient)   Plan:  Will order warfarin 7.5 mg PO dose this evening.  Enoxaparin 1 mg/kg subQ q12h bridge therapy ordered to begin this evening Will recheck INR with AM labs tomorrow  Lenis Noon, PharmD, BCPS Clinical Pharmacist 10/03/2017,2:14 PM

## 2017-10-03 NOTE — Progress Notes (Signed)
PT Cancellation Note  Patient Details Name: Shelly Huynh MRN: [id number] DOB: 05-31-1971   Cancelled Treatment:    Reason Eval/Treat Not Completed: Patient at procedure or test/unavailable(consult received and chart reviewed.  Patient currently off unit for diagnostic procedure (thoracentesis).  Will re-attempt at later time/date as medically appropriate and available.)   Lateka Rady H. Owens Shark, PT, DPT, NCS 10/03/17, 9:56 AM [phone]

## 2017-10-03 NOTE — Progress Notes (Signed)
Pt on home cpap unit w/o complications.

## 2017-10-03 NOTE — Progress Notes (Signed)
Pharmacy Antibiotic Note  Shelly Huynh is a 46 y.o. female admitted on 10/02/2017 with aspiration pneumonia.  Pharmacy has been consulted for piperacillin/tazobactam dosing.  Plan: Patient is currently receiving vancomycin as well.  Will order piperacillin/tazobactam 3.375 g IV q8h EI  Height: 5' 2"  (157.5 cm) Weight: 215 lb 9.6 oz (97.8 kg) IBW/kg (Calculated) : 50.1  Temp (24hrs), Avg:98.2 F (36.8 C), Min:98 F (36.7 C), Max:98.3 F (36.8 C)  Recent Labs  Lab 10/02/17 1121  WBC 9.6  CREATININE 0.74    Estimated Creatinine Clearance: 95 mL/min (by C-G formula based on SCr of 0.74 mg/dL).    Allergies  Allergen Reactions  . Ivp Dye [Iodinated Diagnostic Agents] Anaphylaxis and Hives    Especially CT contrast media; tightness in chest  . Sulfa Antibiotics Hives and Anaphylaxis  . Banana Itching  . Guaifenesin Hives  . Hctz [Hydrochlorothiazide] Hives  . Losartan Itching  . Povidone Iodine Hives  . Povidone-Iodine Hives  . Lisinopril Hives and Rash  . Mucinex [Guaifenesin Er] Hives and Rash    Antimicrobials this admission: vancomycin 11/12 >>  Piperacillin/tazobactam 11/13 >>   Dose adjustments this admission:  Microbiology results: 11/12 BCx: No growth < 24 hours 11/12 MRSA PCR: Negative  Thank you for allowing pharmacy to be a part of this patient's care.  Lenis Noon, PharmD Clinical Pharmacist 10/03/2017 10:25 AM

## 2017-10-03 NOTE — Evaluation (Signed)
Clinical/Bedside Swallow Evaluation Patient Details  Name: Shelly Huynh MRN: [id number] Date of Birth: [birth year]  Today's Date: 10/03/2017 Time: SLP Start Time (ACUTE ONLY): 0930 SLP Stop Time (ACUTE ONLY): 1030 SLP Time Calculation (min) (ACUTE ONLY): 60 min  Past Medical History:  Past Medical History:  Diagnosis Date  . Acid reflux   . Anxiety   . Arthritis   . CHF (congestive heart failure) ([place])   . Depression   . Diabetes mellitus without complication ([place])   . Heart valve replaced   . High cholesterol   . High cholesterol   . Hypertension   . Hypokalemia   . IBS (irritable bowel syndrome)   . Sciatic pain    Past Surgical History:  Past Surgical History:  Procedure Laterality Date  . ABDOMINAL HYSTERECTOMY    . BREAST BIOPSY Left 07/25/2017   path pending  . BREAST EXCISIONAL BIOPSY Right 1992   NEG  . CARDIAC VALVE SURGERY    . CESAREAN SECTION    . HERNIA REPAIR     HPI:  The patient with past medical history of diabetes, mechanical heart valve, hypertension, acid reflux, and obstructive sleep apnea who is postoperative day 13 from gastric sleeve operation presents to the emergency department complaining of shortness of breath.  The patient states that she awoke this morning feeling as if she might suffocate.  She also became incredibly dyspneic walking from her bed to the restroom.  She came to the emergency department for evaluation where she was found to have normal oxygen saturation on room air.  Chest x-ray showed a possible case of pneumonia within her right lung.  The patient takes Coumadin but is subtherapeutic secondary to reduced dosing prior to her surgery.  Due to her ongoing shortness of breath and labored breathing the patient underwent CT of the chest to rule out pulmonary embolism.  ([name] allergic to contrast dye thus no angiogram performed).  Imaging revealed significant right-sided pleural effusion as well as small left pleural effusion.  The  patient received cefepime and vancomycin in the emergency department but was persistently tachypneic with ambulation which prompted the emergency department staff to call the hospitalist service for admission. Noted Chest CT results indicating atelectasis within the anterior basal right upper lobe and in the right middle lobe. Atelectasis and/or consolidation noted in the right lower lobe - unsure if related to any impact from the recent postoperative day 13 from gastric sleeve operation; Acid Reflux at baseline.     Assessment / Plan / Recommendation Clinical Impression  Pt appears to present w/ adequate oropharyngeal swallow function w/ decreased risk for aspiration from an oropharyngeal phase standpoint when following general aspiration precautions. However, pt's past med hx includes gastric sleeve operation (approx 2 weeks ago), and Acid Reflux diagnosis per chart which can directly increase risk for aspiration of REGURGITATED Reflux material thus impacting Pulmonary status including development of pneumonia.  Pt reported her GI has had her full liquids (diet) but instructed her on a GI soft diet recently.   Pt was given trials of thin liquid via cup, puree and soft solids during this evaluation. During trials of thin liquid via cup, pt's oral phase appeared grossly [facility] c/b timely A-P transit and swallow, control/awareness of the bolus, and adequate oral clearing. No immediate, overt s/s of aspiration were noted. No decline in vocal quality or respiratory status were noted. During trials of puree and soft solid, pt's oral phase appeared grossly WFL c/b awareness/control of the  bolus, adequate mastication, timely A-P transit and swallow, and adeqaute oral clearing. No overt, immediate s/s of aspiration were noted. No decline in vocal quality or respiratory status were noted. Pt was educated on general aspiration precautions - sitting upright, small bites/sips, slow pace, cutting meats, moistening foods,  and reducing environmental distractions. Pt was educated on general Reflux precautions - slow pace, small bites, sitting upright for ~60 min after meals and not lying down for bedtime for ~90 mins after last meal/snack. Pt was able to feed self independently.  D/t pt's appropriate oropharyngeal phase presentation and no noted oropharyngeal deficits, pt is appropriate for regular consistency diet w/ thin liquids (cut meats; moistened foods for easier breakdown); recommend meds whole w/ water; general aspiration precautions.  Recommend thorough f/u w/ her GI post the gastric sleeve procedure to ensure she is following the correct diet for consistency and volume; REFLUX and GI precautions.  No further skilled ST services are indicated at this time. NSG will reconsult if any decline in status occurs. NSG/MD updated.       Aspiration Risk  (decreased risk when following aspiration precautions) from an oropharyngeal phase standpoint; risk for aspiration of REFLUX though   Diet Recommendation   Regular diet w/ thin liquids; general aspiration precautions - check w/ GI for volume/consistency of certain foods.   Medication Administration: Whole meds with liquid    Other  Recommendations Recommended Consults: (GI f/u; Dietician f/u) Oral Care Recommendations: Oral care BID;Patient independent with oral care   Follow up Recommendations None      Frequency and Duration  n/a         Prognosis Prognosis for Safe Diet Advancement: Good      Swallow Study   General Date of Onset: 10/02/17 HPI: The patient with past medical history of diabetes, mechanical heart valve, hypertension, acid reflux, and obstructive sleep apnea who is postoperative day 13 from gastric sleeve operation presents to the emergency department complaining of shortness of breath.  The patient states that she awoke this morning feeling as if she might suffocate.  She also became incredibly dyspneic walking from her bed to the  restroom.  She came to the emergency department for evaluation where she was found to have normal oxygen saturation on room air.  Chest x-ray showed a possible case of pneumonia within her right lung.  The patient takes Coumadin but is subtherapeutic secondary to reduced dosing prior to her surgery.  Due to her ongoing shortness of breath and labored breathing the patient underwent CT of the chest to rule out pulmonary embolism.  ([name] allergic to contrast dye thus no angiogram performed).  Imaging revealed significant right-sided pleural effusion as well as small left pleural effusion.  The patient received cefepime and vancomycin in the emergency department but was persistently tachypneic with ambulation which prompted the emergency department staff to call the hospitalist service for admission. Type of Study: Bedside Swallow Evaluation Previous Swallow Assessment: none reported Diet Prior to this Study: NPO Temperature Spikes Noted: No(wbc WNL) Respiratory Status: Room air History of Recent Intubation: No Behavior/Cognition: Alert;Cooperative;Pleasant mood Oral Cavity Assessment: Within Functional Limits Oral Care Completed by SLP: Recent completion by staff Oral Cavity - Dentition: Adequate natural dentition Vision: Functional for self-feeding Self-Feeding Abilities: Able to feed self Patient Positioning: Upright in bed Baseline Vocal Quality: Normal Volitional Cough: Strong Volitional Swallow: Able to elicit    Oral/Motor/Sensory Function Overall Oral Motor/Sensory Function: Within functional limits   Ice Chips Ice chips: Not tested  Thin Liquid Thin Liquid: Within functional limits Presentation: Cup;Self Fed(3 trials)    Nectar Thick Nectar Thick Liquid: Not tested   Honey Thick Honey Thick Liquid: Not tested   Puree Puree: Within functional limits Presentation: Self Fed;Spoon(5 trials)   Solid   GO   Solid: Within functional limits Presentation: Self Fed(3 trials graham  cracker)        Carolynn Sayers, SLP-Graduate Student Carolynn Sayers 10/03/2017,10:54 AM    The information in this patient note, response to treatment, and overall treatment plan developed has been reviewed and agreed upon by this clinician.  Orinda Kenner, [place], [place] [phone] 10/03/17,11:51 AM

## 2017-10-03 NOTE — Plan of Care (Signed)
S/p thoracentesis. Puncture site WDL, band aid dry and intact. O2 sats in the mid 90's on RA. Pt uses incentive spirometer frequently. Norco given x2 during the shift for abdominal pain with improvement.

## 2017-10-03 NOTE — Plan of Care (Signed)
Pt has been voiding frequently and is concerned about possible thoracentesis today. Pt receive pain med x1. Pt uses CPAP at night but unable to tolerate the mask on nose, so pt was placed on 2L nasal cannula. No other signs of distress noted., Will continue to monitor.

## 2017-10-03 NOTE — Consult Note (Signed)
[facility]  CARDIOLOGY CONSULT NOTE  Patient ID: Shelly Huynh MRN: [id number] DOB/AGE: 47-Oct-1972 47 y.o.  Admit date: 10/02/2017 Referring Physician Dr. Benjie Karvonen Primary Physician Dr. Glendon Axe Primary Cardiologist Dr. Nehemiah Massed Reason for Consultation SOB  HPI: Pt is a 46 yo female with history of cad, valvular disease s/p  aortic valve replacement, mitral valve replacement, and tricuspid valve replacement on warfarin for chronic anticoagulation, who is s/p gastric sleave surgery with post op course complicated by pvcs. Who presented to the er with complaints of sob. CXR revealed possible airspace disease vs volume overload. Chest ct revealed bilateral pleural effusions right, greater than the left. There was borderline edema. She underwent right sided thoracentesis of 700 cc today with clinical improvement. She has ruled out for an mi. [name] on emperic abx. She has some pleuritic right sided chest pain. Echo is pending. Renal function is normal.   Review of Systems  Constitutional: Positive for malaise/fatigue.  HENT: Negative.   Eyes: Negative.   Respiratory: Positive for shortness of breath.   Cardiovascular: Positive for chest pain.  Gastrointestinal: Negative.   Genitourinary: Negative.   Musculoskeletal: Negative.   Skin: Negative.   Neurological: Negative.   Endo/Heme/Allergies: Negative.   Psychiatric/Behavioral: Negative.     Past Medical History:  Diagnosis Date  . Acid reflux   . Anxiety   . Arthritis   . CHF (congestive heart failure) ([place])   . Depression   . Diabetes mellitus without complication ([place])   . Heart valve replaced   . High cholesterol   . High cholesterol   . Hypertension   . Hypokalemia   . IBS (irritable bowel syndrome)   . Sciatic pain     Family History  Problem Relation Age of Onset  . Breast cancer Paternal Grandmother     Social History   Socioeconomic History  . Marital  status: Married    Spouse name: Not on file  . Number of children: Not on file  . Years of education: Not on file  . Highest education level: Not on file  Social Needs  . Financial resource strain: Somewhat hard  . Food insecurity - worry: Often true  . Food insecurity - inability: Often true  . Transportation needs - medical: No  . Transportation needs - non-medical: No  Occupational History  . Not on file  Tobacco Use  . Smoking status: Former Smoker    Last attempt to quit: 03/08/2015    Years since quitting: 2.5  . Smokeless tobacco: Never Used  Substance and Sexual Activity  . Alcohol use: No  . Drug use: No  . Sexual activity: Not on file  Other Topics Concern  . Not on file  Social History Narrative  . Not on file    Past Surgical History:  Procedure Laterality Date  . ABDOMINAL HYSTERECTOMY    . BREAST BIOPSY Left 07/25/2017   path pending  . BREAST EXCISIONAL BIOPSY Right 1992   NEG  . CARDIAC VALVE SURGERY    . CESAREAN SECTION    . HERNIA REPAIR       Medications Prior to Admission  Medication Sig Dispense Refill Last Dose  . aspirin EC 81 MG tablet Take 81 mg daily by mouth.   10/02/2017 at 0800  . B Complex Vitamins (VITAMIN B COMPLEX) TABS Take 1 tablet daily by mouth.   10/02/2017 at 0800  . Biotin 5000 MCG TABS Take 1 tablet  daily by mouth.   10/02/2017 at 0800  . carvedilol (COREG) 3.125 MG tablet Take 3.125 mg 2 (two) times daily by mouth.    10/02/2017 at 0800  . fluticasone (FLONASE) 50 MCG/ACT nasal spray Place 2 sprays daily into the nose.    prn at prn  . furosemide (LASIX) 40 MG tablet Take by mouth.   prn at prn  . hydrALAZINE (APRESOLINE) 100 MG tablet Take 100 mg 2 (two) times daily by mouth.    10/02/2017 at 0800  . HYDROcodone-acetaminophen (NORCO/VICODIN) 5-325 MG per tablet Take 1 tablet by mouth 2 (two) times daily as needed for moderate pain.   10/01/2017 at 2200  . levalbuterol (XOPENEX) 1.25 MG/3ML nebulizer solution Inhale 1.25 mg  every 6 (six) hours as needed into the lungs.    prn at prn  . levocetirizine (XYZAL) 5 MG tablet Take 1 tablet every evening by mouth.   10/01/2017 at 2200  . Multiple Vitamins-Minerals (MULTIVITAMIN WITH MINERALS) tablet Take 1 tablet daily by mouth.   10/02/2017 at 0800  . pantoprazole (PROTONIX) 40 MG tablet Take 1 tablet by mouth two  times daily   10/02/2017 at 0800  . potassium chloride (MICRO-K) 10 MEQ CR capsule Take by mouth.   10/02/2017 at 0800  . pravastatin (PRAVACHOL) 20 MG tablet Take 20 mg by mouth daily.    10/01/2017 at 2000  . verapamil (CALAN-SR) 120 MG CR tablet Take 120 mg 3 (three) times daily by mouth.    10/02/2017 at 0800  . warfarin (COUMADIN) 7.5 MG tablet Take 4-9.5 mg by mouth.    10/01/2017 at Unknown time  . albuterol (PROAIR HFA) 108 (90 BASE) MCG/ACT inhaler Inhale into the lungs.   prn at prn  . amoxicillin (AMOXIL) 500 MG capsule Take 2,000 mg by mouth as needed (for dental procedure).    prn at prn  . benzonatate (TESSALON PERLES) 100 MG capsule Take 1 capsule (100 mg total) by mouth every 6 (six) hours as needed for cough. (Patient not taking: Reported on 10/02/2017) 30 capsule 0 Not Taking at Unknown time  . cyclobenzaprine (FLEXERIL) 5 MG tablet Take 5 mg by mouth at bedtime.    prn at prn  . diazepam (VALIUM) 5 MG tablet Take by mouth.   prn at prn  . Linaclotide (LINZESS) 290 MCG CAPS capsule Take by mouth.   prn at prn  . meclizine (ANTIVERT) 25 MG tablet Take by mouth.   prn at prn  . traMADol (ULTRAM) 50 MG tablet Take by mouth.   prn at prn    Physical Exam: Blood pressure (!) 141/77, pulse 67, temperature 98.2 F (36.8 C), temperature source Oral, resp. rate 12, height 5' 2"  (1.575 m), weight 97.8 kg (215 lb 9.6 oz), SpO2 100 %.   Wt Readings from Last 1 Encounters:  10/03/17 97.8 kg (215 lb 9.6 oz)     General appearance: alert and cooperative Resp: diminished breath sounds bibasilar Chest wall: right sided chest wall tenderness Cardio:  regular rate and rhythm Extremities: extremities normal, atraumatic, no cyanosis or edema Neurologic: Grossly normal  Labs:   Lab Results  Component Value Date   WBC 9.6 10/02/2017   HGB 12.8 10/02/2017   HCT 40.1 10/02/2017   MCV 88.9 10/02/2017   PLT 438 10/02/2017    Recent Labs  Lab 10/02/17 1121  NA 139  K 3.6  CL 104  CO2 22  BUN 7  CREATININE 0.74  CALCIUM 9.9  GLUCOSE 85  Lab Results  Component Value Date   TROPONINI <0.03 10/02/2017      Radiology: Bilateral plerual effusions. EKG: nsr  ASSESSMENT AND PLAN:  Pt with history of valvular heart disease s/p avr, mvr and tvr with recent gastric sleave procedure. Post op course has been complicated by incresing sob and palpitaiotns. [name] admitted with sob and was noted to have bilateral pleural efusions. [name] s/p thoracentesis of 700 cc on the right. Echo is pending. WIll evaluate lv funciton and valve funciton. Conitnue with emperic abx. Continue with warfarin with inr goal of 2.5-3.5. COntinue carvedilol, iv furosemide, hydralazine, and verapamil. Will follow with you.  Signed: Teodoro Spray MD, [facility] 10/03/2017, 7:00 PM

## 2017-10-03 NOTE — Procedures (Signed)
Ultrasound-guided diagnostic and therapeutic right thoracentesis performed yielding 0.7 liters of bloody colored fluid. No immediate complications. Follow-up chest x-ray pending.      Shelly Huynh E 9:46 AM 10/03/2017

## 2017-10-03 NOTE — Progress Notes (Signed)
[place] [facility] [place] NAME: Shelly Huynh    MR#:  [id number]  DATE OF BIRTH:  10-Mar-1971  SUBJECTIVE:   Patient feels better after thoracentesis.  REVIEW OF SYSTEMS:    Review of Systems  Constitutional: Negative for fever, chills weight loss HENT: Negative for ear pain, nosebleeds, congestion, facial swelling, rhinorrhea, neck pain, neck stiffness and ear discharge.   Respiratory: Negative for cough, shortness of breath, wheezing  Cardiovascular: Negative for chest pain, palpitations and leg swelling.  Gastrointestinal: Negative for heartburn, abdominal pain, vomiting, diarrhea or consitpation Genitourinary: Negative for dysuria, urgency, frequency, hematuria Musculoskeletal: Negative for back pain or joint pain Neurological: Negative for dizziness, seizures, syncope, focal weakness,  numbness and headaches.  Hematological: Does not bruise/bleed easily.  Psychiatric/Behavioral: Negative for hallucinations, confusion, dysphoric mood    Tolerating Diet: yes      DRUG ALLERGIES:   Allergies  Allergen Reactions  . Ivp Dye [Iodinated Diagnostic Agents] Anaphylaxis and Hives    Especially CT contrast media; tightness in chest  . Sulfa Antibiotics Hives and Anaphylaxis  . Banana Itching  . Guaifenesin Hives  . Hctz [Hydrochlorothiazide] Hives  . Losartan Itching  . Povidone Iodine Hives  . Povidone-Iodine Hives  . Lisinopril Hives and Rash  . Mucinex [Guaifenesin Er] Hives and Rash    VITALS:  Blood pressure (!) 156/83, pulse 73, temperature 98.3 F (36.8 C), temperature source Oral, resp. rate 12, height 5' 2"  (1.575 m), weight 97.8 kg (215 lb 9.6 oz), SpO2 98 %.  PHYSICAL EXAMINATION:  Constitutional: Appears well-developed and well-nourished. No distress. HENT: Normocephalic. Marland Kitchen Oropharynx is clear and moist.  Eyes: Conjunctivae and EOM are normal. PERRLA, no scleral icterus.  Neck: Normal ROM. Neck supple. No JVD. No  tracheal deviation. CVS: RRR, S1/S2 +, no murmurs, no gallops, no carotid bruit.  Pulmonary: Effort and breath sounds normal, no stridor, rhonchi, wheezes, rales.  Abdominal: Soft. BS +,  no distension, tenderness, rebound or guarding.  Musculoskeletal: Normal range of motion. No edema and no tenderness.  Neuro: Alert. CN 2-12 grossly intact. No focal deficits. Skin: Skin is warm and dry. No rash noted. Psychiatric: Normal mood and affect.      LABORATORY PANEL:   CBC Recent Labs  Lab 10/02/17 1121  WBC 9.6  HGB 12.8  HCT 40.1  PLT 438   ------------------------------------------------------------------------------------------------------------------  Chemistries  Recent Labs  Lab 10/02/17 1121  NA 139  K 3.6  CL 104  CO2 22  GLUCOSE 85  BUN 7  CREATININE 0.74  CALCIUM 9.9   ------------------------------------------------------------------------------------------------------------------  Cardiac Enzymes Recent Labs  Lab 10/02/17 1121  TROPONINI <0.03   ------------------------------------------------------------------------------------------------------------------  RADIOLOGY:  Dg Chest 1 View  Result Date: 10/03/2017 CLINICAL DATA:  Post thoracentesis EXAM: CHEST 1 VIEW COMPARISON:  Yesterday FINDINGS: There is no pneumothorax after right thoracentesis. Right pleural effusion is improved. Lungs remain under aerated with bibasilar atelectasis. Cardiomegaly. Postop changes. IMPRESSION: No pneumothorax. Electronically Signed   By: Marybelle Killings M.D.   On: 10/03/2017 10:01   Dg Chest 2 View  Result Date: 10/02/2017 CLINICAL DATA:  Shortness of breath and mid chest pain since 5 a.m. Former smoker. EXAM: CHEST  2 VIEW COMPARISON:  06/12/2017. FINDINGS: The heart is enlarged. Prior median sternotomy for valve replacement. Borderline pulmonary edema. Increasing opacity at the RIGHT base, elevated RIGHT hemidiaphragm with volume loss, suspicious for RIGHT lower lobe  pneumonia. No osseous findings. IMPRESSION: Cardiomegaly. Borderline edema. Increasing opacity at the RIGHT  base. Correlate clinically for pneumonia. Electronically Signed   By: Staci Righter M.D.   On: 10/02/2017 12:08   Ct Chest Wo Contrast  Result Date: 10/02/2017 CLINICAL DATA:  Dyspnea. Shortness of breath. Recent gastric sleeve surgery EXAM: CT CHEST WITHOUT CONTRAST TECHNIQUE: Multidetector CT imaging of the chest was performed following the standard protocol without IV contrast. COMPARISON:  None. FINDINGS: Cardiovascular: Previous median sternotomy and CABG procedure. Moderate cardiac enlargement. No pericardial effusion. Mediastinum/Nodes: The trachea appears patent and is midline. Prominent bilateral mediastinal lymph nodes are identified. Index right paratracheal node measures 1.2 cm, image 44 of series 2. Index pre vascular lymph node measures 1.4 cm, image 47 of series 2. Right cardio phrenic angle noted measures 1 cm, image 99 of series 2. Lungs/Pleura: Moderate bilateral pleural scratch set there is a moderate to large right pleural effusion identified. Small to moderate left pleural effusion noted. Subsegmental atelectasis within the right middle lobe and anterior basal right upper lobe identified. Atelectasis and/or consolidation within the right lower lobe identified. Calcified granuloma within the left lower lobe identified. Upper Abdomen: There is a small volume of ascites identified overlying the liver. Postsurgical changes within the left upper quadrant of the abdomen are incompletely visualized and difficult to characterize. Musculoskeletal: No chest wall mass or suspicious bone lesions identified. Scoliosis deformity is identified. The thoracic spine is convex towards the left. IMPRESSION: 1. Bilateral pleural effusions are identified right greater in left. 2. There is subsegmental atelectasis within the anterior basal right upper lobe and in the right middle lobe. Atelectasis and/or  consolidation noted in the right lower lobe. 3. Nonspecific mediastinal lymph node enlargement. The largest node is in the prevascular region measuring 1.4 cm. 4. Small volume of ascites noted within the upper abdomen. 5. Postsurgical changes from recent gastric sleeve surgery partially visualized. Difficult to evaluate without intravenous and oral contrast material. 6. Cardiac enlargement.  Status post CABG procedure. Electronically Signed   By: Kerby Moors M.D.   On: 10/02/2017 15:06   US Thoracentesis Asp Pleural Space W/img Guide  Result Date: 10/03/2017 INDICATION: Status post gastric sleeve surgery on Coumadin. Patient with shortness of breath. Recent imaging reveals bilateral pleural effusions with the right greater than left. Request was made for diagnostic and therapeutic thoracentesis. EXAM: ULTRASOUND GUIDED DIAGNOSTIC AND THERAPEUTIC THORACENTESIS MEDICATIONS: 1% xylocaine COMPLICATIONS: None immediate. PROCEDURE: An ultrasound guided thoracentesis was thoroughly discussed with the patient and questions answered. The benefits, risks, alternatives and complications were also discussed. The patient understands and wishes to proceed with the procedure. Written consent was obtained. Ultrasound was performed to localize and mark an adequate pocket of fluid in the right chest. The area was then prepped and draped in the normal sterile fashion. 1% xylocaine was used for local anesthesia. Under ultrasound guidance a Safe-T-Centesis catheter was introduced. Thoracentesis was performed. The catheter was removed and a dressing applied. FINDINGS: A total of approximately 0.7 L of bloody fluid was removed. Samples were sent to the laboratory as requested by the clinical team. IMPRESSION: Successful ultrasound guided right thoracentesis yielding 0.7 L of pleural fluid. Read by: Saverio Danker, PA-C Electronically Signed   By: Marybelle Killings M.D.   On: 10/03/2017 10:06     ASSESSMENT AND PLAN:     46 year old female with chronic diastolic heart failure and recent gastric sleeve procedure who presented with shortness of breath.  1. Bilateral pleural effusions right greater than the left: This is etiology of shortness of breath [name] status post thoracentesis  and is feeling much better. Start IV Lasix Check echocardiogram  2. Healthcare associated pneumonia/ATLECTASIS: Continue Zosyn and vancomycin If MRSA PCR is negative discontinue vancomycin Speech consultation to evaluate for aspiration ISS   3. Essential hypertension: Continue Coreg, hydralazine and verapamil  4. Acute on chronic diastolic heart failure:  Check ECHO Cardiology consult Monitor daily weight and I/o Lasix 40 mg IV daily   Management plans discussed with the patient and she is in agreement.  CODE STATUS: full  TOTAL TIME TAKING CARE OF THIS PATIENT: 30 minutes.     POSSIBLE D/C tomorrow, DEPENDING ON CLINICAL CONDITION.   Shelly Huynh M.D on 10/03/2017 at 11:42 AM  Between 7am to 6pm - Pager - [phone] After 6pm go to [email or web address] - password [email or web address] [facility]  Office  [phone]  CC: Primary care physician; Glendon Axe, MD  Note: This dictation was prepared with Dragon dictation along with smaller phrase technology. Any transcriptional errors that result from this process are unintentional.

## 2017-10-04 ENCOUNTER — Inpatient Hospital Stay
Admit: 2017-10-04 | Discharge: 2017-10-04 | Disposition: A | Discharge status: Home / Self Care | Payer: Managed Care, Other (non HMO) | Attending: Internal Medicine | Admitting: Internal Medicine

## 2017-10-04 LAB — BASIC METABOLIC PANEL
Anion gap: 11 (ref 5–15)
BUN: 10 mg/dL (ref 6–20)
CHLORIDE: 101 mmol/L (ref 101–111)
CO2: 24 mmol/L (ref 22–32)
CREATININE: 0.72 mg/dL (ref 0.44–1.00)
Calcium: 9.6 mg/dL (ref 8.9–10.3)
GFR calc Af Amer: >60 mL/min (ref >60)
GFR calc non Af Amer: >60 mL/min (ref >60)
Glucose, Bld: 87 mg/dL (ref 65–99)
Potassium: 3.1 mmol/L — ABNORMAL LOW (ref 3.5–5.1)
Sodium: 136 mmol/L (ref 135–145)

## 2017-10-04 LAB — PROTIME-INR
INR: 1.45
Prothrombin Time: 17.5 seconds — ABNORMAL HIGH (ref 11.4–15.2)

## 2017-10-04 LAB — CBC
HEMATOCRIT: 34.8 % — AB (ref 35.0–47.0)
Hemoglobin: 11.2 g/dL — ABNORMAL LOW (ref 12.0–16.0)
MCH: 28.3 pg (ref 26.0–34.0)
MCHC: 32.1 g/dL (ref 32.0–36.0)
MCV: 88.1 fL (ref 80.0–100.0)
PLATELETS: 424 10*3/uL (ref 150–440)
RBC: 3.95 MIL/uL (ref 3.80–5.20)
RDW: 18.9 % — AB (ref 11.5–14.5)
WBC: 8.8 10*3/uL (ref 3.6–11.0)

## 2017-10-04 LAB — PH, BODY FLUID: pH, Body Fluid: 7.6

## 2017-10-04 LAB — TRIGLYCERIDES, BODY FLUIDS: Triglycerides, Fluid: 58 mg/dL

## 2017-10-04 LAB — ECHOCARDIOGRAM COMPLETE
Height: 62 in
Weight: 3403.2 oz

## 2017-10-04 MED ORDER — WARFARIN SODIUM 7.5 MG PO TABS
7.5000 mg | ORAL_TABLET | Freq: Once | ORAL | Status: DC
  Filled 2017-10-04: qty 1

## 2017-10-04 MED ORDER — WARFARIN SODIUM 7.5 MG PO TABS: 7.5000 mg | ORAL_TABLET | Freq: Once | ORAL | 0 refills | Status: DC

## 2017-10-04 MED ORDER — FUROSEMIDE 20 MG PO TABS: 20.0000 mg | ORAL_TABLET | Freq: Every day | ORAL | 11 refills | Status: DC

## 2017-10-04 MED ORDER — POTASSIUM CHLORIDE CRYS ER 20 MEQ PO TBCR
40.0000 meq | EXTENDED_RELEASE_TABLET | Freq: Every day | ORAL | Status: DC
  Administered 2017-10-04: 09:00:00 40 meq via ORAL
  Filled 2017-10-04: qty 2

## 2017-10-04 MED ORDER — LEVOFLOXACIN 750 MG PO TABS: 750.0000 mg | ORAL_TABLET | Freq: Every day | ORAL | 0 refills | Status: DC

## 2017-10-04 MED ORDER — ENOXAPARIN SODIUM 100 MG/ML ~~LOC~~ SOLN: 1.0000 mg/kg | Freq: Two times a day (BID) | SUBCUTANEOUS | 0 refills | Status: DC

## 2017-10-04 MED ORDER — ALBUTEROL SULFATE (2.5 MG/3ML) 0.083% IN NEBU: 2.5000 mg | INHALATION_SOLUTION | RESPIRATORY_TRACT | Status: DC | PRN

## 2017-10-04 NOTE — Discharge Summary (Signed)
[place] [facility] [place] NAME: Shelly Huynh    MR#:  [id number]  DATE OF BIRTH:  Jan 20, 1971  DATE OF ADMISSION:  10/02/2017 ADMITTING PHYSICIAN: Harrie Foreman, MD  DATE OF DISCHARGE: 10/04/2017  PRIMARY CARE PHYSICIAN: Glendon Axe, MD    ADMISSION DIAGNOSIS:  Pleural effusion [J90]  DISCHARGE DIAGNOSIS:  Active Problems:   Pleural effusion   HCAP (healthcare-associated pneumonia)   SECONDARY DIAGNOSIS:   Past Medical History:  Diagnosis Date  . Acid reflux   . Anxiety   . Arthritis   . CHF (congestive heart failure) ([place])   . Depression   . Diabetes mellitus without complication ([place])   . Heart valve replaced   . High cholesterol   . High cholesterol   . Hypertension   . Hypokalemia   . IBS (irritable bowel syndrome)   . Sciatic pain     HOSPITAL COURSE:   46 year old female with chronic diastolic heart failure and recent gastric sleeve procedure who presented with shortness of breath.  1. Bilateral pleural effusions right greater than the left: This is etiology of shortness of breath [name] status post thoracentesis and is feeling much better.  Her ECHO showed her mechanical valves. There was no other major valvular abnormalities. Ejection fraction remained within normal limits.  2. Healthcare associated pneumonia/ATLECTASIS: Her symptoms have improved. She was discharged with Levaquin 5 days.  3. Essential hypertension: Continue Coreg, hydralazine and verapamil  4. Acute on chronic diastolic heart failure:   her symptoms have improved. [name] referred to CHF clinic at discharge. She will also be discharged with Lasix 20 mg daily with outpatient follow-up with her primary care physician.  5. 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 Goal INR 2.33.5. She needs INR checked in 3 days.  DISCHARGE CONDITIONS AND DIET:    Stable for discharge on regular heart healthy diet  CONSULTS  OBTAINED:  Treatment Team:  Teodoro Spray, MD  DRUG ALLERGIES:   Allergies  Allergen Reactions  . Ivp Dye [Iodinated Diagnostic Agents] Anaphylaxis and Hives    Especially CT contrast media; tightness in chest  . Sulfa Antibiotics Hives and Anaphylaxis  . Banana Itching  . Guaifenesin Hives  . Hctz [Hydrochlorothiazide] Hives  . Losartan Itching  . Povidone Iodine Hives  . Povidone-Iodine Hives  . Lisinopril Hives and Rash  . Mucinex [Guaifenesin Er] Hives and Rash    DISCHARGE MEDICATIONS:   Current Discharge Medication List    START taking these medications   Details  enoxaparin (LOVENOX) 100 MG/ML injection Inject 1 mL (100 mg total) every 12 (twelve) hours for 4 days into the skin. Qty: 8 mL, Refills: 0    levofloxacin (LEVAQUIN) 750 MG tablet Take 1 tablet (750 mg total) daily by mouth. Qty: 5 tablet, Refills: 0      CONTINUE these medications which have CHANGED   Details  furosemide (LASIX) 20 MG tablet Take 1 tablet (20 mg total) daily by mouth. Qty: 30 tablet, Refills: 11    warfarin (COUMADIN) 7.5 MG tablet Take 1 tablet (7.5 mg total) one time only at 6 PM by mouth. Qty: 30 tablet, Refills: 0      CONTINUE these medications which have NOT CHANGED   Details  aspirin EC 81 MG tablet Take 81 mg daily by mouth.    B Complex Vitamins (VITAMIN B COMPLEX) TABS Take 1 tablet daily by mouth.    Biotin 5000 MCG TABS Take 1  tablet daily by mouth.    carvedilol (COREG) 3.125 MG tablet Take 3.125 mg 2 (two) times daily by mouth.     hydrALAZINE (APRESOLINE) 100 MG tablet Take 100 mg 2 (two) times daily by mouth.     HYDROcodone-acetaminophen (NORCO/VICODIN) 5-325 MG per tablet Take 1 tablet by mouth 2 (two) times daily as needed for moderate pain.    levocetirizine (XYZAL) 5 MG tablet Take 1 tablet every evening by mouth.    Multiple Vitamins-Minerals (MULTIVITAMIN WITH MINERALS) tablet Take 1 tablet daily by mouth.    pantoprazole (PROTONIX) 40 MG tablet  Take 1 tablet by mouth two  times daily    pravastatin (PRAVACHOL) 20 MG tablet Take 20 mg by mouth daily.     verapamil (CALAN-SR) 120 MG CR tablet Take 120 mg 3 (three) times daily by mouth.     albuterol (PROAIR HFA) 108 (90 BASE) MCG/ACT inhaler Inhale into the lungs.    amoxicillin (AMOXIL) 500 MG capsule Take 2,000 mg by mouth as needed (for dental procedure).     cyclobenzaprine (FLEXERIL) 5 MG tablet Take 5 mg by mouth at bedtime.     diazepam (VALIUM) 5 MG tablet Take by mouth.    Linaclotide (LINZESS) 290 MCG CAPS capsule Take by mouth.    meclizine (ANTIVERT) 25 MG tablet Take by mouth.    traMADol (ULTRAM) 50 MG tablet Take by mouth.      STOP taking these medications     fluticasone (FLONASE) 50 MCG/ACT nasal spray      levalbuterol (XOPENEX) 1.25 MG/3ML nebulizer solution      potassium chloride (MICRO-K) 10 MEQ CR capsule      benzonatate (TESSALON PERLES) 100 MG capsule           Today   CHIEF COMPLAINT:   Patient doing well this point. No shortness of breath.   VITAL SIGNS:  Blood pressure 126/67, pulse 63, temperature 98.3 F (36.8 C), temperature source Oral, resp. rate 18, height 5' 2"  (1.575 m), weight 96.5 kg (212 lb 11.2 oz), SpO2 98 %.   REVIEW OF SYSTEMS:  Review of Systems  Constitutional: Negative.  Negative for chills, fever and malaise/fatigue.  HENT: Negative.  Negative for ear discharge, ear pain, hearing loss, nosebleeds and sore throat.   Eyes: Negative.  Negative for blurred vision and pain.  Respiratory: Negative.  Negative for cough, hemoptysis, shortness of breath and wheezing.   Cardiovascular: Negative.  Negative for chest pain, palpitations and leg swelling.  Gastrointestinal: Negative.  Negative for abdominal pain, blood in stool, diarrhea, nausea and vomiting.  Genitourinary: Negative.  Negative for dysuria.  Musculoskeletal: Negative.  Negative for back pain.  Skin: Negative.   Neurological: Negative for dizziness,  tremors, speech change, focal weakness, seizures and headaches.  Endo/Heme/Allergies: Negative.  Does not bruise/bleed easily.  Psychiatric/Behavioral: Negative.  Negative for depression, hallucinations and suicidal ideas.     PHYSICAL EXAMINATION:  GENERAL:  46 y.o.-year-old patient lying in the bed with no acute distress.  NECK:  Supple, no jugular venous distention. No thyroid enlargement, no tenderness.  LUNGS: Normal breath sounds bilaterally, no wheezing, rales,rhonchi  No use of accessory muscles of respiration.  CARDIOVASCULAR: S1, S2 normal. No murmurs, rubs, or gallops.  ABDOMEN: Soft, non-tender, non-distended. Bowel sounds present. No organomegaly or mass.  EXTREMITIES:1+ LEE NO cyanosis, or clubbing.  PSYCHIATRIC: The patient is alert and oriented x 3.  SKIN: No obvious rash, lesion, or ulcer.   DATA REVIEW:   CBC Recent Labs  Lab 10/04/17 0554  WBC 8.8  HGB 11.2*  HCT 34.8*  PLT 424    Chemistries  Recent Labs  Lab 10/04/17 0554  NA 136  K 3.1*  CL 101  CO2 24  GLUCOSE 87  BUN 10  CREATININE 0.72  CALCIUM 9.6    Cardiac Enzymes Recent Labs  Lab 10/02/17 1121  TROPONINI <0.03    Microbiology Results  @[email or web address] @  RADIOLOGY:  Dg Chest 1 View  Result Date: 10/03/2017 CLINICAL DATA:  Post thoracentesis EXAM: CHEST 1 VIEW COMPARISON:  Yesterday FINDINGS: There is no pneumothorax after right thoracentesis. Right pleural effusion is improved. Lungs remain under aerated with bibasilar atelectasis. Cardiomegaly. Postop changes. IMPRESSION: No pneumothorax. Electronically Signed   By: Marybelle Killings M.D.   On: 10/03/2017 10:01   Dg Chest 2 View  Result Date: 10/02/2017 CLINICAL DATA:  Shortness of breath and mid chest pain since 5 a.m. Former smoker. EXAM: CHEST  2 VIEW COMPARISON:  06/12/2017. FINDINGS: The heart is enlarged. Prior median sternotomy for valve replacement. Borderline pulmonary edema. Increasing opacity at the RIGHT base, elevated RIGHT  hemidiaphragm with volume loss, suspicious for RIGHT lower lobe pneumonia. No osseous findings. IMPRESSION: Cardiomegaly. Borderline edema. Increasing opacity at the RIGHT base. Correlate clinically for pneumonia. Electronically Signed   By: Staci Righter M.D.   On: 10/02/2017 12:08   Ct Chest Wo Contrast  Result Date: 10/02/2017 CLINICAL DATA:  Dyspnea. Shortness of breath. Recent gastric sleeve surgery EXAM: CT CHEST WITHOUT CONTRAST TECHNIQUE: Multidetector CT imaging of the chest was performed following the standard protocol without IV contrast. COMPARISON:  None. FINDINGS: Cardiovascular: Previous median sternotomy and CABG procedure. Moderate cardiac enlargement. No pericardial effusion. Mediastinum/Nodes: The trachea appears patent and is midline. Prominent bilateral mediastinal lymph nodes are identified. Index right paratracheal node measures 1.2 cm, image 44 of series 2. Index pre vascular lymph node measures 1.4 cm, image 47 of series 2. Right cardio phrenic angle noted measures 1 cm, image 99 of series 2. Lungs/Pleura: Moderate bilateral pleural scratch set there is a moderate to large right pleural effusion identified. Small to moderate left pleural effusion noted. Subsegmental atelectasis within the right middle lobe and anterior basal right upper lobe identified. Atelectasis and/or consolidation within the right lower lobe identified. Calcified granuloma within the left lower lobe identified. Upper Abdomen: There is a small volume of ascites identified overlying the liver. Postsurgical changes within the left upper quadrant of the abdomen are incompletely visualized and difficult to characterize. Musculoskeletal: No chest wall mass or suspicious bone lesions identified. Scoliosis deformity is identified. The thoracic spine is convex towards the left. IMPRESSION: 1. Bilateral pleural effusions are identified right greater in left. 2. There is subsegmental atelectasis within the anterior basal  right upper lobe and in the right middle lobe. Atelectasis and/or consolidation noted in the right lower lobe. 3. Nonspecific mediastinal lymph node enlargement. The largest node is in the prevascular region measuring 1.4 cm. 4. Small volume of ascites noted within the upper abdomen. 5. Postsurgical changes from recent gastric sleeve surgery partially visualized. Difficult to evaluate without intravenous and oral contrast material. 6. Cardiac enlargement.  Status post CABG procedure. Electronically Signed   By: Kerby Moors M.D.   On: 10/02/2017 15:06   US Thoracentesis Asp Pleural Space W/img Guide  Result Date: 10/03/2017 INDICATION: Status post gastric sleeve surgery on Coumadin. Patient with shortness of breath. Recent imaging reveals bilateral pleural effusions with the right greater than left. Request was made for diagnostic and  therapeutic thoracentesis. EXAM: ULTRASOUND GUIDED DIAGNOSTIC AND THERAPEUTIC THORACENTESIS MEDICATIONS: 1% xylocaine COMPLICATIONS: None immediate. PROCEDURE: An ultrasound guided thoracentesis was thoroughly discussed with the patient and questions answered. The benefits, risks, alternatives and complications were also discussed. The patient understands and wishes to proceed with the procedure. Written consent was obtained. Ultrasound was performed to localize and mark an adequate pocket of fluid in the right chest. The area was then prepped and draped in the normal sterile fashion. 1% xylocaine was used for local anesthesia. Under ultrasound guidance a Safe-T-Centesis catheter was introduced. Thoracentesis was performed. The catheter was removed and a dressing applied. FINDINGS: A total of approximately 0.7 L of bloody fluid was removed. Samples were sent to the laboratory as requested by the clinical team. IMPRESSION: Successful ultrasound guided right thoracentesis yielding 0.7 L of pleural fluid. Read by: Saverio Danker, PA-C Electronically Signed   By: Marybelle Killings M.D.    On: 10/03/2017 10:06      Current Discharge Medication List    START taking these medications   Details  enoxaparin (LOVENOX) 100 MG/ML injection Inject 1 mL (100 mg total) every 12 (twelve) hours for 4 days into the skin. Qty: 8 mL, Refills: 0    levofloxacin (LEVAQUIN) 750 MG tablet Take 1 tablet (750 mg total) daily by mouth. Qty: 5 tablet, Refills: 0      CONTINUE these medications which have CHANGED   Details  furosemide (LASIX) 20 MG tablet Take 1 tablet (20 mg total) daily by mouth. Qty: 30 tablet, Refills: 11    warfarin (COUMADIN) 7.5 MG tablet Take 1 tablet (7.5 mg total) one time only at 6 PM by mouth. Qty: 30 tablet, Refills: 0      CONTINUE these medications which have NOT CHANGED   Details  aspirin EC 81 MG tablet Take 81 mg daily by mouth.    B Complex Vitamins (VITAMIN B COMPLEX) TABS Take 1 tablet daily by mouth.    Biotin 5000 MCG TABS Take 1 tablet daily by mouth.    carvedilol (COREG) 3.125 MG tablet Take 3.125 mg 2 (two) times daily by mouth.     hydrALAZINE (APRESOLINE) 100 MG tablet Take 100 mg 2 (two) times daily by mouth.     HYDROcodone-acetaminophen (NORCO/VICODIN) 5-325 MG per tablet Take 1 tablet by mouth 2 (two) times daily as needed for moderate pain.    levocetirizine (XYZAL) 5 MG tablet Take 1 tablet every evening by mouth.    Multiple Vitamins-Minerals (MULTIVITAMIN WITH MINERALS) tablet Take 1 tablet daily by mouth.    pantoprazole (PROTONIX) 40 MG tablet Take 1 tablet by mouth two  times daily    pravastatin (PRAVACHOL) 20 MG tablet Take 20 mg by mouth daily.     verapamil (CALAN-SR) 120 MG CR tablet Take 120 mg 3 (three) times daily by mouth.     albuterol (PROAIR HFA) 108 (90 BASE) MCG/ACT inhaler Inhale into the lungs.    amoxicillin (AMOXIL) 500 MG capsule Take 2,000 mg by mouth as needed (for dental procedure).     cyclobenzaprine (FLEXERIL) 5 MG tablet Take 5 mg by mouth at bedtime.     diazepam (VALIUM) 5 MG tablet Take  by mouth.    Linaclotide (LINZESS) 290 MCG CAPS capsule Take by mouth.    meclizine (ANTIVERT) 25 MG tablet Take by mouth.    traMADol (ULTRAM) 50 MG tablet Take by mouth.      STOP taking these medications     fluticasone (FLONASE) 50 MCG/ACT  nasal spray      levalbuterol (XOPENEX) 1.25 MG/3ML nebulizer solution      potassium chloride (MICRO-K) 10 MEQ CR capsule      benzonatate (TESSALON PERLES) 100 MG capsule            Management plans discussed with the patient and she is in agreement. Stable for discharge home  Patient should follow up with cardiology  CODE STATUS:     Code Status Orders  (From admission, onward)        Start     Ordered   10/02/17 1826  Full code  Continuous     10/02/17 1825    Code Status History    Date Active Date Inactive Code Status Order ID Comments User Context   03/05/2015 04:15 03/06/2015 14:51 Full Code [id number]  Corey Harold, NP ED      TOTAL TIME TAKING CARE OF THIS PATIENT: 38 minutes.    Note: This dictation was prepared with Dragon dictation along with smaller phrase technology. Any transcriptional errors that result from this process are unintentional.  Jordin Vicencio M.D on 10/04/2017 at 11:52 AM  Between 7am to 6pm - Pager - [phone] After 6pm go to [email or web address] - password [email or web address] [facility]  Office  [phone]  CC: Primary care physician; Glendon Axe, MD

## 2017-10-04 NOTE — Progress Notes (Signed)
Discharge paperwork reviewed with patient who verbalized understanding of new medications and medication changes. Follow up apt made and patient aware. Patient is stable and ready for discharge. Husband to transport home.

## 2017-10-04 NOTE — Progress Notes (Signed)
*  PRELIMINARY RESULTS* Echocardiogram 2D Echocardiogram has been performed.  Sherrie Sport 10/04/2017, 11:55 AM

## 2017-10-04 NOTE — Progress Notes (Signed)
ANTICOAGULATION CONSULT NOTE - Initial Consult  Pharmacy Consult for warfarin Indication: Mechanical aortic and mitral valve  Allergies  Allergen Reactions  . Ivp Dye [Iodinated Diagnostic Agents] Anaphylaxis and Hives    Especially CT contrast media; tightness in chest  . Sulfa Antibiotics Hives and Anaphylaxis  . Banana Itching  . Guaifenesin Hives  . Hctz [Hydrochlorothiazide] Hives  . Losartan Itching  . Povidone Iodine Hives  . Povidone-Iodine Hives  . Lisinopril Hives and Rash  . Mucinex [Guaifenesin Er] Hives and Rash    Patient Measurements: Height: 5' 2"  (157.5 cm) Weight: 212 lb 11.2 oz (96.5 kg) IBW/kg (Calculated) : 50.1  Vital Signs: Temp: 98.3 F (36.8 C) (11/14 0407) Temp Source: Oral (11/14 0407) BP: 126/67 (11/14 0849) Pulse Rate: 63 (11/14 0849)  Labs: Recent Labs    10/02/17 1121 10/04/17 0554  HGB 12.8 11.2*  HCT 40.1 34.8*  PLT 438 424  LABPROT 17.0* 17.5*  INR 1.40 1.45  CREATININE 0.74 0.72  TROPONINI <0.03  --     Estimated Creatinine Clearance: 94.3 mL/min (by C-G formula based on SCr of 0.72 mg/dL).  Assessment: Pharmacy consulted to dose and monitor warfarin in this 46 year old female who was taking warfarin prior to admission for a mechanical aortic and mitral valve.   Patient reports prior to her recent gastric sleeve surgery she was taking warfarin 9.5 mg PO daily, but recently has been taking warfarin 2.5 mg PO Daily. INR = 1.4 is subtherapeutic on admission on current regimen. There is some literature to suggest patient's may need a ~25% dose reduction in warfarin after this surgery.  Dosing History Date INR Dose 11/12 1.40 11/13 -- 7.5 mg 11/14 1.45 7.5 mg  Goal of Therapy:  INR 2.5 - 3.5 (confirmed with patient)   Plan:  Will continue with warfarin 7.5 mg PO dose this evening (approximately 22% reduction from pre-surgery dose). Enoxaparin 1 mg/kg subQ q12h bridge therapy ordered per discussion with MD Will follow INR  closely and recheck with AM labs tomorrow.  Lenis Noon, PharmD, BCPS Clinical Pharmacist 10/04/2017,10:02 AM

## 2017-10-04 NOTE — Progress Notes (Signed)
PT Cancellation Note  Patient Details Name: Shelly Huynh MRN: [id number] DOB: [birth year]   Cancelled Treatment:    Reason Eval/Treat Not Completed: (Chart reviewed for additional attempt at evaluation.  Per primary RN, patient mobilizing indep in room without difficulty of safety concern. No PT needs identified.  Will complete initial order at this time; please re-consult should needs change.)  Teion Ballin H. Owens Shark, PT, DPT, NCS 10/04/17, 9:29 AM [phone]

## 2017-10-05 ENCOUNTER — Telehealth: Payer: Self-pay

## 2017-10-05 NOTE — Telephone Encounter (Signed)
Contacted Ms. Swisher in regards to referral. She as willing to make an appointment with the clinic but is only able to schedule appointments at 4 pm. I explained that these appointment slots are in high demand and fill up very quickly. The next available appointment was 12/31 and offered to the patient which was then declined. The next available was 1/8 which the patient accepted and an appointment was made.   [name] currently following closely with cardiology, and will call back if she can come in sooner.

## 2017-10-05 NOTE — Telephone Encounter (Signed)
-----   Message from Alisa Graff, [place] sent at 10/04/2017  6:42 PM EST ----- Regarding: Please call Hospitalist referral made at discharge

## 2017-10-07 LAB — CULTURE, BLOOD (ROUTINE X 2)
Culture: NO GROWTH
Culture: NO GROWTH
Special Requests: ADEQUATE
Special Requests: ADEQUATE

## 2017-10-07 LAB — BODY FLUID CULTURE: Culture: NO GROWTH

## 2017-11-23 ENCOUNTER — Ambulatory Visit: Payer: Managed Care, Other (non HMO)

## 2017-11-28 ENCOUNTER — Ambulatory Visit: Payer: Managed Care, Other (non HMO) | Admitting: Family

## 2017-12-11 ENCOUNTER — Ambulatory Visit: Payer: Managed Care, Other (non HMO) | Admitting: Family

## 2017-12-18 ENCOUNTER — Ambulatory Visit: Payer: Managed Care, Other (non HMO) | Admitting: Family

## 2018-04-02 DIAGNOSIS — F411 Generalized anxiety disorder: Secondary | ICD-10-CM | POA: Insufficient documentation

## 2018-06-12 DIAGNOSIS — M5412 Radiculopathy, cervical region: Secondary | ICD-10-CM | POA: Insufficient documentation

## 2018-06-24 DIAGNOSIS — E269 Hyperaldosteronism, unspecified: Secondary | ICD-10-CM | POA: Insufficient documentation

## 2018-06-28 ENCOUNTER — Other Ambulatory Visit: Payer: Self-pay | Admitting: Internal Medicine

## 2018-06-28 DIAGNOSIS — E876 Hypokalemia: Secondary | ICD-10-CM

## 2018-06-28 DIAGNOSIS — E269 Hyperaldosteronism, unspecified: Secondary | ICD-10-CM

## 2018-06-28 DIAGNOSIS — I10 Essential (primary) hypertension: Secondary | ICD-10-CM

## 2018-07-03 ENCOUNTER — Ambulatory Visit
Admission: RE | Admit: 2018-07-03 | Discharge: 2018-07-03 | Disposition: A | Discharge status: Home / Self Care | Payer: Managed Care, Other (non HMO) | Source: Ambulatory Visit | Attending: Internal Medicine | Admitting: Internal Medicine

## 2018-07-03 DIAGNOSIS — E876 Hypokalemia: Secondary | ICD-10-CM | POA: Diagnosis not present

## 2018-07-03 DIAGNOSIS — I700 Atherosclerosis of aorta: Secondary | ICD-10-CM | POA: Insufficient documentation

## 2018-07-03 DIAGNOSIS — I10 Essential (primary) hypertension: Secondary | ICD-10-CM | POA: Diagnosis present

## 2018-08-14 DIAGNOSIS — I10 Essential (primary) hypertension: Secondary | ICD-10-CM | POA: Insufficient documentation

## 2018-08-15 ENCOUNTER — Other Ambulatory Visit: Payer: Self-pay | Admitting: Internal Medicine

## 2018-08-15 DIAGNOSIS — Z1231 Encounter for screening mammogram for malignant neoplasm of breast: Secondary | ICD-10-CM

## 2018-08-17 DIAGNOSIS — I152 Hypertension secondary to endocrine disorders: Secondary | ICD-10-CM | POA: Insufficient documentation

## 2018-09-12 DIAGNOSIS — Z9884 Bariatric surgery status: Secondary | ICD-10-CM | POA: Insufficient documentation

## 2018-12-26 DIAGNOSIS — K581 Irritable bowel syndrome with constipation: Secondary | ICD-10-CM | POA: Insufficient documentation

## 2019-02-08 ENCOUNTER — Other Ambulatory Visit: Payer: Self-pay

## 2019-02-08 ENCOUNTER — Ambulatory Visit: Payer: Self-pay

## 2019-02-08 DIAGNOSIS — R6889 Other general symptoms and signs: Secondary | ICD-10-CM

## 2019-02-08 NOTE — Telephone Encounter (Signed)
Patient called and asked what time does the COVID-19 testing site close, advised today at 5pm. I asked does she have a doctor's order, she says yes. She says she's not a [facility] patient, but was sent to the site on [address] for testing.

## 2019-02-17 LAB — NOVEL CORONAVIRUS, NAA: SARS-CoV-2, NAA: NOT DETECTED

## 2019-04-10 ENCOUNTER — Other Ambulatory Visit: Payer: Self-pay | Admitting: Student

## 2019-04-10 DIAGNOSIS — M25561 Pain in right knee: Secondary | ICD-10-CM

## 2019-04-10 DIAGNOSIS — M25461 Effusion, right knee: Secondary | ICD-10-CM

## 2019-04-10 DIAGNOSIS — Z6841 Body Mass Index (BMI) 40.0 and over, adult: Secondary | ICD-10-CM | POA: Insufficient documentation

## 2019-04-11 ENCOUNTER — Ambulatory Visit
Admission: RE | Admit: 2019-04-11 | Discharge: 2019-04-11 | Disposition: A | Discharge status: Home / Self Care | Payer: Managed Care, Other (non HMO) | Source: Ambulatory Visit | Attending: Student | Admitting: Student

## 2019-04-11 ENCOUNTER — Other Ambulatory Visit: Payer: Self-pay

## 2019-04-11 DIAGNOSIS — M25561 Pain in right knee: Secondary | ICD-10-CM | POA: Diagnosis present

## 2019-04-11 DIAGNOSIS — M25461 Effusion, right knee: Secondary | ICD-10-CM | POA: Diagnosis present

## 2019-05-13 ENCOUNTER — Ambulatory Visit (HOSPITAL_COMMUNITY)
Admission: EM | Admit: 2019-05-13 | Discharge: 2019-05-13 | Disposition: A | Discharge status: Home / Self Care | Payer: Managed Care, Other (non HMO) | Attending: Family Medicine | Admitting: Family Medicine

## 2019-05-13 ENCOUNTER — Encounter (HOSPITAL_COMMUNITY): Payer: Self-pay | Admitting: Emergency Medicine

## 2019-05-13 ENCOUNTER — Other Ambulatory Visit: Payer: Self-pay

## 2019-05-13 DIAGNOSIS — S0501XA Injury of conjunctiva and corneal abrasion without foreign body, right eye, initial encounter: Secondary | ICD-10-CM

## 2019-05-13 MED ORDER — ERYTHROMYCIN 5 MG/GM OP OINT: TOPICAL_OINTMENT | OPHTHALMIC | 0 refills | Status: DC

## 2019-05-13 NOTE — ED Provider Notes (Signed)
[facility]    CSN: [id number] Arrival date & time: 05/13/19  1017      History   Chief Complaint Chief Complaint  Patient presents with  . Eye Pain    HPI Shelly Huynh is a 48 y.o. female history of GERD, anxiety, DM type II, presenting today for evaluation of right eye irritation.  Patient states that yesterday she was working in her garden planting seeds.  She notes that she was sweating a lot and believes oils from her hair ran into her eye.  She had some slight blurred vision, but she flushed this out and did not have any further issues.  Later that evening she developed increased discomfort in her eye and a sensation of foreign body present.  She denies changes in vision.  She does admit to using contacts, but has not used these recently.  Denies eye pain, mainly irritation.  She notes that the little she puts in her hair is coconut oil, olive oil, vitamin E, apple cider vinegar and distilled water.  HPI  Past Medical History:  Diagnosis Date  . Acid reflux   . Anxiety   . Arthritis   . CHF (congestive heart failure) ([place])   . Depression   . Diabetes mellitus without complication ([place])   . Heart valve replaced   . High cholesterol   . High cholesterol   . Hypertension   . Hypokalemia   . IBS (irritable bowel syndrome)   . Sciatic pain     Patient Active Problem List   Diagnosis Date Noted  . HCAP (healthcare-associated pneumonia) 10/03/2017  . Pleural effusion 10/02/2017  . Tobacco abuse 09/19/2016  . Bilateral leg edema 08/23/2016  . Osteoarthritis of spine with radiculopathy, lumbar region 08/08/2016  . Diabetes mellitus type 2, diet-controlled ([place]) 05/17/2016  . Vitamin D deficiency 05/17/2016  . Gastroesophageal reflux disease without esophagitis 10/12/2015  . Spondylosis of lumbar region without myelopathy or radiculopathy 10/12/2015  . Adiposity 06/30/2015  . Compulsive tobacco user syndrome 06/30/2015  . 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 Palpitations 05/18/2015  . Chronic anticoagulation 05/07/2015  . Benign essential HTN 05/05/2015  . Absolute anemia 04/26/2015  . Acute on chronic combined systolic and diastolic congestive heart failure ([place]) 04/08/2015  . H/O aortic valve replacement 04/07/2015  . H/O heart valve replacement with mechanical valve 04/07/2015  . H/O prosthetic heart valve 04/07/2015  . 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 Acute right heart failure ([place]) 04/04/2015  . History of open heart surgery 04/03/2015  . H/O mitral valve replacement with mechanical valve 04/03/2015  . 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 Acute respiratory failure with hypoxemia ([place]) 03/05/2015  . Coronary artery disease due to calcified coronary lesion 03/04/2015  . Obstructive apnea 10/28/2014  . Enlarged heart 10/28/2014  . Combined fat and carbohydrate induced hyperlipemia 09/11/2014    Past Surgical History:  Procedure Laterality Date  . ABDOMINAL HYSTERECTOMY    . BREAST BIOPSY Left 07/25/2017   path pending  . BREAST EXCISIONAL BIOPSY Right 1992   NEG  . CARDIAC VALVE SURGERY    . CESAREAN SECTION    . ESOPHAGOGASTRODUODENOSCOPY (EGD) WITH PROPOFOL N/A 10/07/2016   Procedure: ESOPHAGOGASTRODUODENOSCOPY (EGD) WITH PROPOFOL;  Surgeon: Jonathon Bellows, MD;  Location: [facility] ENDOSCOPY;  Service: Endoscopy;  Laterality: N/A;  . HERNIA REPAIR      OB History   No obstetric history on file.      Home Medications    Prior to  Admission medications   Medication Sig Start Date End Date Taking? Authorizing Provider  albuterol (PROAIR HFA) 108 (90 BASE) MCG/ACT inhaler Inhale into the lungs. 08/06/14   [provider]  amoxicillin (AMOXIL) 500 MG capsule Take 2,000 mg by mouth as needed (for dental procedure).  01/20/15   [provider]  aspirin EC 81 MG tablet Take 81 mg daily by mouth.    [provider]  B Complex Vitamins (VITAMIN B COMPLEX) TABS Take 1 tablet daily by  mouth.    [provider]  Biotin 5000 MCG TABS Take 1 tablet daily by mouth.    [provider]  carvedilol (COREG) 3.125 MG tablet Take 3.125 mg 2 (two) times daily by mouth.  06/22/15 10/02/17  [provider]  cyclobenzaprine (FLEXERIL) 5 MG tablet Take 5 mg by mouth at bedtime.  01/13/15   [provider]  diazepam (VALIUM) 5 MG tablet Take by mouth.    [provider]  enoxaparin (LOVENOX) 100 MG/ML injection Inject 1 mL (100 mg total) every 12 (twelve) hours for 4 days into the skin. 10/04/17 10/08/17  Bettey Costa, MD  erythromycin ophthalmic ointment Place a 1/2 inch ribbon of ointment into the lower eyelid 4-6 times daily 05/13/19   ,  C, PA-C  furosemide (LASIX) 20 MG tablet Take 1 tablet (20 mg total) daily by mouth. 10/04/17 10/04/18  Bettey Costa, MD  hydrALAZINE (APRESOLINE) 100 MG tablet Take 100 mg 2 (two) times daily by mouth.  05/30/15   [provider]  HYDROcodone-acetaminophen (NORCO/VICODIN) 5-325 MG per tablet Take 1 tablet by mouth 2 (two) times daily as needed for moderate pain.    [provider]  levocetirizine (XYZAL) 5 MG tablet Take 1 tablet every evening by mouth. 05/10/17 05/10/18  [provider]  levofloxacin (LEVAQUIN) 750 MG tablet Take 1 tablet (750 mg total) daily by mouth. 10/04/17   Bettey Costa, MD  Linaclotide (LINZESS) 290 MCG CAPS capsule Take by mouth. 05/04/15   [provider]  meclizine (ANTIVERT) 25 MG tablet Take by mouth. 05/11/15   [provider]  Multiple Vitamins-Minerals (MULTIVITAMIN WITH MINERALS) tablet Take 1 tablet daily by mouth.    [provider]  pantoprazole (PROTONIX) 40 MG tablet Take 1 tablet by mouth two  times daily 06/30/16   [provider]  pravastatin (PRAVACHOL) 20 MG tablet Take 20 mg by mouth daily.  12/07/14   [provider]  traMADol (ULTRAM) 50 MG tablet Take by mouth. 08/09/16   [provider]   verapamil (CALAN-SR) 120 MG CR tablet Take 120 mg 3 (three) times daily by mouth.  06/15/15   [provider]  warfarin (COUMADIN) 7.5 MG tablet Take 1 tablet (7.5 mg total) one time only at 6 PM by mouth. 10/04/17   Bettey Costa, MD    Family History Family History  Problem Relation Age of Onset  . Breast cancer Paternal Grandmother     Social History Social History   Tobacco Use  . Smoking status: Former Smoker    Quit date: 03/08/2015    Years since quitting: 4.1  . Smokeless tobacco: Never Used  Substance Use Topics  . Alcohol use: No  . Drug use: No     Allergies   Ivp dye [iodinated diagnostic agents], Sulfa antibiotics, Banana, Guaifenesin, Hctz [hydrochlorothiazide], Losartan, Povidone iodine, Povidone-iodine, Lisinopril, and Mucinex [guaifenesin er]   Review of Systems Review of Systems  Constitutional: Negative for activity change, appetite change, chills,  fatigue and fever.  HENT: Negative for congestion, ear pain, rhinorrhea, sinus pressure, sore throat and trouble swallowing.   Eyes: Positive for discharge, redness and itching. Negative for photophobia and visual disturbance.  Respiratory: Negative for cough, chest tightness and shortness of breath.   Cardiovascular: Negative for chest pain.  Gastrointestinal: Negative for abdominal pain, diarrhea, nausea and vomiting.  Musculoskeletal: Negative for myalgias.  Skin: Negative for rash.  Neurological: Negative for dizziness, light-headedness and headaches.     Physical Exam Triage Vital Signs ED Triage Vitals [05/13/19 1101]  Enc Vitals Group     BP 116/83     Pulse Rate 75     Resp 18     Temp 98.4 F (36.9 C)     Temp Source Oral     SpO2 98 %     Weight      Height      Head Circumference      Peak Flow      Pain Score 7     Pain Loc      Pain Edu?      Excl. in [place]?    No data found.  Updated Vital Signs BP 116/83 (BP Location: Right Arm)   Pulse 75   Temp 98.4 F (36.9 C)  (Oral)   Resp 18   SpO2 98%   Visual Acuity Right Eye Distance:  20/100 Left Eye Distance:  20/70 Bilateral Distance:  20/30  Right Eye Near:   Left Eye Near:    Bilateral Near:     Physical Exam Vitals signs and nursing note reviewed.  Constitutional:      General: [name] not in acute distress.    Appearance: [name] well-developed.  HENT:     Head: Normocephalic and atraumatic.     Ears:     Comments: Bilateral ears without tenderness to palpation of external auricle, tragus and mastoid, EAC's without erythema or swelling, TM's with good bony landmarks and cone of light. Non erythematous.    Mouth/Throat:     Comments: Oral mucosa pink and moist, no tonsillar enlargement or exudate. Posterior pharynx patent and nonerythematous, no uvula deviation or swelling. Normal phonation. Eyes:     Extraocular Movements: Extraocular movements intact.     Conjunctiva/sclera: Conjunctivae normal.     Pupils: Pupils are equal, round, and reactive to light.     Comments: Right eye appear slightly injected compared to left.  Watery drainage present.  Pressure averaging 17 with Tono-Pen.  Large ovular area of fluorescein uptake directly over central cornea, see picture below.  Neck:     Musculoskeletal: Neck supple.  Cardiovascular:     Rate and Rhythm: Normal rate and regular rhythm.     Heart sounds: No murmur.  Pulmonary:     Effort: Pulmonary effort is normal. No respiratory distress.     Breath sounds: Normal breath sounds.  Abdominal:     Palpations: Abdomen is soft.     Tenderness: There is no abdominal tenderness.  Skin:    General: Skin is warm and dry.  Neurological:     Mental Status: [name] alert.        UC Treatments / Results  Labs (all labs ordered are listed, but only abnormal results are displayed) Labs Reviewed - No data to display  EKG None  Radiology No results found.  Procedures Procedures (including critical care time)  Medications Ordered in UC  Medications - No data to display  Initial Impression / Assessment  and Plan / UC Course  I have reviewed the triage vital signs and the nursing notes.  Pertinent labs & imaging results that were available during my care of the patient were reviewed by me and considered in my medical decision making (see chart for details).     Patient appears to have corneal abrasion versus ulcer.  Will treat with erythromycin ointment and have follow-up with ophthalmology.  Patient states that she had an eye doctor that she sees, but provided ophthalmologist on call as well.  Follow-up if developing worsening pain or changes in vision.Discussed strict return precautions. Patient verbalized understanding and is agreeable with plan.  Final Clinical Impressions(s) / UC Diagnoses   Final diagnoses:  Abrasion of right cornea, initial encounter     Discharge Instructions     You have a corneal abrasion/ulcer on your eye Begin erythromycin eye drops 4-6 times daily Contact ophthalmology for follow up to ensure this is healing  Follow up if developing increased pain or changes in vision   ED Prescriptions    Medication Sig Dispense Auth. Provider   erythromycin ophthalmic ointment Place a 1/2 inch ribbon of ointment into the lower eyelid 4-6 times daily 3.5 g , [place] C, PA-C     Controlled Substance Prescriptions [place] Controlled Substance Registry consulted? Not Applicable   Janith Lima, [place] 05/13/19 1141

## 2019-05-13 NOTE — Discharge Instructions (Signed)
You have a corneal abrasion/ulcer on your eye Begin erythromycin eye drops 4-6 times daily Contact ophthalmology for follow up to ensure this is healing  Follow up if developing increased pain or changes in vision

## 2019-05-13 NOTE — ED Triage Notes (Signed)
Pt sts right eye irritation after working in garden yesterday; pt sts eye is watering and irritated

## 2019-06-25 ENCOUNTER — Other Ambulatory Visit: Payer: Self-pay | Admitting: Internal Medicine

## 2019-06-25 DIAGNOSIS — Z1231 Encounter for screening mammogram for malignant neoplasm of breast: Secondary | ICD-10-CM

## 2019-08-07 ENCOUNTER — Other Ambulatory Visit: Payer: Self-pay

## 2019-08-07 DIAGNOSIS — Z20822 Contact with and (suspected) exposure to covid-19: Secondary | ICD-10-CM

## 2019-08-08 LAB — NOVEL CORONAVIRUS, NAA: SARS-CoV-2, NAA: NOT DETECTED

## 2019-08-26 ENCOUNTER — Other Ambulatory Visit: Payer: Self-pay | Admitting: Internal Medicine

## 2019-08-26 DIAGNOSIS — Z952 Presence of prosthetic heart valve: Secondary | ICD-10-CM

## 2019-08-26 DIAGNOSIS — R1010 Upper abdominal pain, unspecified: Secondary | ICD-10-CM

## 2019-08-26 DIAGNOSIS — M5412 Radiculopathy, cervical region: Secondary | ICD-10-CM

## 2019-09-06 ENCOUNTER — Other Ambulatory Visit: Payer: Self-pay

## 2019-09-06 ENCOUNTER — Ambulatory Visit
Admission: RE | Admit: 2019-09-06 | Discharge: 2019-09-06 | Disposition: A | Discharge status: Home / Self Care | Payer: Managed Care, Other (non HMO) | Source: Ambulatory Visit | Attending: Internal Medicine | Admitting: Internal Medicine

## 2019-09-06 DIAGNOSIS — R1010 Upper abdominal pain, unspecified: Secondary | ICD-10-CM | POA: Diagnosis present

## 2019-09-06 DIAGNOSIS — M5412 Radiculopathy, cervical region: Secondary | ICD-10-CM | POA: Diagnosis present

## 2019-09-06 DIAGNOSIS — Z952 Presence of prosthetic heart valve: Secondary | ICD-10-CM | POA: Diagnosis present

## 2019-10-01 ENCOUNTER — Ambulatory Visit
Admission: RE | Admit: 2019-10-01 | Discharge: 2019-10-01 | Disposition: A | Discharge status: Home / Self Care | Payer: Managed Care, Other (non HMO) | Source: Ambulatory Visit | Attending: Internal Medicine | Admitting: Internal Medicine

## 2019-10-01 DIAGNOSIS — Z1231 Encounter for screening mammogram for malignant neoplasm of breast: Secondary | ICD-10-CM | POA: Diagnosis present

## 2019-10-03 ENCOUNTER — Other Ambulatory Visit: Payer: Self-pay | Admitting: Internal Medicine

## 2019-10-03 DIAGNOSIS — R928 Other abnormal and inconclusive findings on diagnostic imaging of breast: Secondary | ICD-10-CM

## 2019-10-03 DIAGNOSIS — R921 Mammographic calcification found on diagnostic imaging of breast: Secondary | ICD-10-CM

## 2019-10-03 DIAGNOSIS — N6320 Unspecified lump in the left breast, unspecified quadrant: Secondary | ICD-10-CM

## 2019-10-11 ENCOUNTER — Ambulatory Visit
Admission: RE | Admit: 2019-10-11 | Discharge: 2019-10-11 | Disposition: A | Discharge status: Home / Self Care | Payer: Managed Care, Other (non HMO) | Source: Ambulatory Visit | Attending: Internal Medicine | Admitting: Internal Medicine

## 2019-10-11 DIAGNOSIS — R921 Mammographic calcification found on diagnostic imaging of breast: Secondary | ICD-10-CM

## 2019-10-11 DIAGNOSIS — N6320 Unspecified lump in the left breast, unspecified quadrant: Secondary | ICD-10-CM

## 2019-10-11 DIAGNOSIS — R928 Other abnormal and inconclusive findings on diagnostic imaging of breast: Secondary | ICD-10-CM | POA: Diagnosis present

## 2019-10-14 ENCOUNTER — Other Ambulatory Visit: Payer: Self-pay | Admitting: Internal Medicine

## 2019-10-14 DIAGNOSIS — R928 Other abnormal and inconclusive findings on diagnostic imaging of breast: Secondary | ICD-10-CM

## 2019-10-14 DIAGNOSIS — R921 Mammographic calcification found on diagnostic imaging of breast: Secondary | ICD-10-CM

## 2019-10-22 ENCOUNTER — Ambulatory Visit
Admission: RE | Admit: 2019-10-22 | Discharge: 2019-10-22 | Disposition: A | Discharge status: Home / Self Care | Payer: Managed Care, Other (non HMO) | Source: Ambulatory Visit | Attending: Internal Medicine | Admitting: Internal Medicine

## 2019-10-22 DIAGNOSIS — R921 Mammographic calcification found on diagnostic imaging of breast: Secondary | ICD-10-CM | POA: Insufficient documentation

## 2019-10-22 DIAGNOSIS — R928 Other abnormal and inconclusive findings on diagnostic imaging of breast: Secondary | ICD-10-CM

## 2019-10-22 DIAGNOSIS — M542 Cervicalgia: Secondary | ICD-10-CM | POA: Insufficient documentation

## 2019-10-22 DIAGNOSIS — R200 Anesthesia of skin: Secondary | ICD-10-CM | POA: Insufficient documentation

## 2019-10-22 HISTORY — PX: BREAST BIOPSY: SHX20

## 2019-10-23 LAB — SURGICAL PATHOLOGY

## 2019-10-28 DIAGNOSIS — R7309 Other abnormal glucose: Secondary | ICD-10-CM | POA: Insufficient documentation

## 2019-10-28 DIAGNOSIS — K7460 Unspecified cirrhosis of liver: Secondary | ICD-10-CM | POA: Insufficient documentation

## 2019-10-28 DIAGNOSIS — M5416 Radiculopathy, lumbar region: Secondary | ICD-10-CM | POA: Insufficient documentation

## 2019-11-14 ENCOUNTER — Ambulatory Visit: Payer: Self-pay | Admitting: General Surgery

## 2019-11-14 NOTE — H&P (Signed)
PATIENT PROFILE: [name] is a 48 y.o. female who presents to the Clinic for consultation at the request of Dr. Ginette Pitman for evaluation of right breast complex sclerosing lesion.  PCP:  Azzie Glatter, MD  HISTORY OF PRESENT ILLNESS: [name] reports she had her screening mammogram as usual.  He denies any palpable mass, skin changes, nipple retraction, nipple discharge before straining mammogram.  She denies any breast pain.  There is no pain radiation.  There is no alleviating or aggravating factor.  Her mammogram shows 2 lesions with microcalcifications on the upper outer quadrant of the right breast.  There was suspicious for malignancy.  Diagnostic mammogram confirmed this lesions.  No lesions were appreciated on ultrasound.  This led to stereotactic core needle biopsy.  Biopsy showed complete sclerosing lesion without atypia.  Focus of radial scar concerning on one of the lesions.  On the left breast there was complex cyst with benign features.  79-monthfollow-up was recommended for this cyst.  Family history of breast cancer: Paternal grandmother Family history of other cancers: Paternal grandmother with lung cancer, mother with thyroid cancer Menarche: 89years old Menopause: 48years old due to hysterectomy for fibroids Used OCP: Yes Used estrogen and progesterone therapy: No History of Radiation to the chest: No Number of pregnancies: 4 (2 completed pregnancies) Previous biopsy: 1 use biopsy on the right breast, 1 biopsy on the the left breast  PROBLEM LIST:         Problem List  Date Reviewed: 06/25/2019         Noted   Cirrhosis of liver without ascites (CMS-HCC) 10/28/2019   Lumbar radicular pain 10/28/2019   Elevated hemoglobin A1c 10/28/2019   Neck pain, bilateral 10/22/2019   Bilateral hand numbness 10/22/2019   SOBOE (shortness of breath on exertion) 05/17/2019   BMI 40.0-44.9, adult (CMS-HCC) (Chronic) 04/10/2019   Irritable bowel syndrome with  constipation 12/26/2018   Bariatric surgery status 09/12/2018   Hypertension due to endocrine disorder 08/17/2018   Hypertension, well controlled 08/14/2018   Hyperaldosteronism (CMS-HCC) 06/24/2018   Cervical radiculopathy 06/12/2018   Anxiety state 04/02/2018   Hypokalemia 12/24/2017   Pleural effusion 10/02/2017   Mitral valve disease, 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 Jude MVR 2016      SOBOE (shortness of breath on exertion) 04/24/2017   Diabetes mellitus (CMS-HCC) 09/07/2016   GERD with esophagitis 09/07/2016   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CMS-HCC) 09/07/2016   Pure hypercholesterolemia 08/08/2016   Osteoarthritis of spine with radiculopathy, lumbar region 08/08/2016   Vitamin D deficiency 05/17/2016   Diabetes mellitus type 2, diet-controlled (CMS-HCC) 05/17/2016   Spondylosis of lumbar region without myelopathy or radiculopathy 10/12/2015   Gastroesophageal reflux disease without esophagitis 10/12/2015   Nicotine dependence, uncomplicated 81/02/3887  Chronic hypokalemia 06/15/2015   Palpitations 05/18/2015   Long term current use of anticoagulants with INR goal of 2.5-3.5 05/11/2015   Mixed hyperlipidemia 05/11/2015   Chronic anticoagulation 05/07/2015   Overview    Coumadin anti-coagulation therapy mechanical heart valve prosthesis with goal INR range 2.5-3.5      Acute postoperative pain 04/29/2015   Postoperative anemia 04/26/2015   Acute post-operative pain 04/10/2015   S/P heart valve replacement with mechanical valve 04/07/2015   S/P AVR (aortic valve replacement) 04/07/2015   S/P TVR (tricuspid valve replacement) 04/07/2015   S/P MVR (mitral valve replacement) 04/07/2015   H/O mechanical aortic valve replacement 04/03/2015   Overview  19 mm Saint Jude aortic valve prosthesis      H/O mitral valve replacement with mechanical valve 04/03/2015   Overview    25 mm Saint Jude mechanical mitral valve prosthesis       History of tricuspid valve replacement with bioprosthetic valve 04/03/2015   Overview    29 mm Mosaic biologic tricuspid valve prosthesis      Presence of prosthetic heart valve 04/03/2015   Overview    29 mm Mosaic biologic tricuspid valve prosthesis      Avoidance of blood transfusions except in life-threatening or emergency situations. 03/12/2015   Overview     See [facility] for Blood Conservation Consult note.       Coronary artery disease due to calcified coronary lesion (Chronic) 03/04/2015   Overview    1. As determined by CT scan with Dr. Nehemiah Massed (Cardiology)      OSA (obstructive sleep apnea) 10/28/2014   Enlarged heart 10/28/2014   Obesity Unknown      GENERAL REVIEW OF SYSTEMS:   General ROS: negative for - chills, fatigue, fever, weight gain or weight loss Allergy and Immunology ROS: negative for - hives  Hematological and Lymphatic ROS: negative for - bleeding problems or bruising, negative for palpable nodes Endocrine ROS: negative for - heat or cold intolerance, hair changes Respiratory ROS: negative for - cough, shortness of breath or wheezing Cardiovascular ROS: no chest pain or palpitations GI ROS: negative for nausea, vomiting, abdominal pain, diarrhea, constipation Musculoskeletal ROS: negative for - joint swelling or muscle pain Neurological ROS: negative for - confusion, syncope Dermatological ROS: negative for pruritus and rash Psychiatric: negative for anxiety, depression, difficulty sleeping and memory loss  MEDICATIONS: Current Medications        Current Outpatient Medications  Medication Sig Dispense Refill  . albuterol 90 mcg/actuation inhaler Inhale 2 inhalations into the lungs every 6 (six) hours as needed for Wheezing 1 Inhaler 5  . aspirin 81 MG EC tablet Take 1 tablet (81 mg total) by mouth once daily 90 tablet 3  . candesartan (ATACAND) 8 MG tablet Take 1 tablet (8 mg total) by mouth once daily 90 tablet  3  . carvediloL (COREG) 3.125 MG tablet Take 1 tablet (3.125 mg total) by mouth 2 (two) times daily with meals 180 tablet 3  . cyclobenzaprine (FLEXERIL) 10 MG tablet Take 1 tablet (10 mg total) by mouth 3 (three) times daily as needed 30 tablet 1  . diazePAM (VALIUM) 5 MG tablet diazepam 5 mg tablet    . docusate sodium (COLACE ORAL) Take 1 capsule by mouth once daily as needed      . DULoxetine (CYMBALTA) 30 MG DR capsule Take 1 capsule (30 mg total) by mouth once daily 30 capsule 5  . fluticasone propionate (FLONASE) 50 mcg/actuation nasal spray Place 2 sprays into both nostrils 2 (two) times daily 16 mL 5  . gabapentin (NEURONTIN) 300 MG capsule Take 1 capsule (300 mg total) by mouth nightly 90 capsule 1  . Herbal Supplement Blackseed Oil  1 teaspoon daily    . hydrocortisone 2.5 % cream APPLY A THIN LAYER TO FACE BID PRN FOR 1 TO 2 WEEKS    . iron polysaccharides (FERREX) 150 mg iron capsule Take 1 capsule (150 mg total) by mouth once daily 90 capsule 3  . levalbuterol (XOPENEX) 1.25 mg/3 mL nebulizer solution levalbuterol 1.25 mg/3 mL solution for nebulization    . levocetirizine (XYZAL) 5 MG tablet TAKE 1 TABLET(5 MG) BY  MOUTH EVERY EVENING 90 tablet 1  . linaCLOtide (LINZESS) 290 mcg capsule Take 1 capsule (290 mcg total) by mouth once daily 10 capsule 2  . meclizine (ANTIVERT) 25 mg tablet Take 1 tablet (25 mg total) by mouth every 6 (six) hours as needed 30 tablet 3  . minoxidiL (ROGAINE) 5 % Foam APPLY 1 ML TO SCALP DAILY    . ONETOUCH VERIO test strip USE TO CHECK BLOOD SUGARS 3 TIMES DAILY IN THE MORNING  FASTING BEFORE LUNCH AND  BEFORE BEDTIME 300 strip 3  . pantoprazole (PROTONIX) 40 MG DR tablet Take 1 tablet (40 mg total) by mouth 2 (two) times daily 180 tablet 1  . potassium chloride (MICRO-K) 10 MEQ ER capsule TAKE 1 CAPSULE BY MOUTH  ONCE DAILY TAKE 4 CAPSULES  ONLY WHEN TAKING FUROSEMIDE 90 capsule 1  . pravastatin (PRAVACHOL) 20 MG tablet Take 1 tablet (20 mg  total) by mouth once daily 90 tablet 1  . spironolactone (ALDACTONE) 25 MG tablet Take 1 tablet (25 mg total) by mouth once daily Take with 50 mg tablet = 75 mg daily. 90 tablet 1  . spironolactone (ALDACTONE) 50 MG tablet Take 1 tablet (50 mg total) by mouth once daily Take with 25 mg tablet = 75 mg daily. 90 tablet 1  . warfarin (COUMADIN) 6 MG tablet TAKE 2 TABLETS BY MOUTH  ONCE DAILY 180 tablet 2  . warfarin (COUMADIN) 7.5 MG tablet Take 1 tablet (7.5 mg total) by mouth once daily 90 tablet 1   No current facility-administered medications for this visit.       ALLERGIES: Iodinated contrast media, Sulfa (sulfonamide antibiotics), Keflex [cephalexin], Betadine [povidone-iodine], Lisinopril, Losartan, and Mucinex [guaifenesin]  PAST MEDICAL HISTORY:     Past Medical History:  Diagnosis Date  . 3-vessel CAD 09/13/2014   By cat scan   . Allergy   . Anxiety   . Aortic valve stenosis and insufficiency, rheumatic 01/15/2015  . Arthritis   . Asthma without status asthmaticus, unspecified   . ATN (acute tubular necrosis) (CMS-HCC)   . CHF (congestive heart failure) (CMS-HCC)   . Chronic anticoagulation 05/07/2015   Coumadin anti-coagulation therapy mechanical heart valve prosthesis with goal INR range 2.5-3.5   . Chronic diastolic heart failure, NYHA class 2 (CMS-HCC) 03/04/2015  . Coronary artery disease due to calcified coronary lesion 03/04/2015   1. As determined by CT scan with Dr. Nehemiah Massed (Cardiology)  . Current smoker 11/10/2016  . Depression   . Diabetes mellitus type 2, diet-controlled (CMS-HCC) 05/17/2016  . Encounter for blood transfusion   . Essential hypertension 09/11/2014  . Fever in adult 07/08/2017  . GERD (gastroesophageal reflux disease)   . H/O mechanical aortic valve replacement 04/03/2015   19 mm Saint Jude aortic valve prosthesis  . H/O mitral valve replacement with mechanical valve 04/03/2015   25 mm Saint Jude mechanical mitral valve prosthesis   . Hepatic disease   . History of tricuspid valve replacement with bioprosthetic valve 04/03/2015   29 mm Mosaic biologic tricuspid valve prosthesis  . Hyperlipemia, mixed 09/11/2014  . Hyperlipidemia   . Hypertension   . Long term current use of anticoagulants with INR goal of 2.5-3.5 05/11/2015  . Mitral stenosis with insufficiency, 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  . Moderate tricuspid regurgitation 01/15/2015  . Obesity   . OSA (obstructive sleep apnea) 10/28/2014  . Postoperative anemia   . Postoperative infection of wound of sternum 04/24/2015  . Postoperative infection of wound of sternum, subsequent encounter 04/24/2015  .  Refusal of blood transfusions as patient is Jehovah's Witness 03/12/2015  . Shortness of breath 10/28/2014  . Sleep apnea   . Tobacco abuse   . Tobacco abuse     PAST SURGICAL HISTORY:      Past Surgical History:  Procedure Laterality Date  . Cardiac Catherization  12/05/2014  . CARDIAC VALVE REPLACEMENT  04/02/2015  . [place] and 2000  . HERNIA REPAIR  09/2004  . HYSTERECTOMY VAGINAL  2009  . LIPOSUCTION TRUNK  1996  . REPLACEMENT AORTIC VALVE N/A 04/03/2015   Procedure: REPLACEMENT AORTIC VALVE;  Surgeon: Elna Breslow, MD;  Location: [facility];  Service: Cardiothoracic;  Laterality: N/A;  . REPLACEMENT MITRAL VALVE N/A 04/03/2015   Procedure: REPLACEMENT MITRAL VALVE;  Surgeon: Elna Breslow, MD;  Location: [facility];  Service: Cardiothoracic;  Laterality: N/A;  . REPLACEMENT TRICUSPID VALVE N/A 04/03/2015   Procedure: REPLACEMENT TRICUSPID VALVE ;  Surgeon: Elna Breslow, MD;  Location: [facility];  Service: Cardiothoracic;  Laterality: N/A;  . STERNOTOMY MEDIAN N/A 04/03/2015   Procedure: STERNOTOMY MEDIAN;  Surgeon: Elna Breslow, MD;  Location: [facility];  Service: Cardiothoracic;  Laterality: N/A;  . TUBAL LIGATION  08/1999  . UMBILICAL HERNIA REPAIR  2005     FAMILY HISTORY:       Family History  Problem Relation Age of Onset  . Uterine cancer Mother   . Thyroid disease Mother   . Thyroid cancer Mother   . No Known Problems Father   . Coronary Artery Disease (Blocked arteries around heart) Other        grandparents  . Breast cancer Paternal Grandmother   . Coronary Artery Disease (Blocked arteries around heart) Paternal Grandmother   . High blood pressure (Hypertension) Paternal Grandmother   . Thyroid disease Paternal Grandmother   . Coronary Artery Disease (Blocked arteries around heart) Maternal Grandmother   . High blood pressure (Hypertension) Maternal Grandmother      SOCIAL HISTORY: Social History          Socioeconomic History  . Marital status: Married    Spouse name: Not on file  . Number of children: Not on file  . Years of education: Not on file  . Highest education level: Not on file  Occupational History  . Not on file  Social Needs  . Financial resource strain: Not on file  . Food insecurity    Worry: Not on file    Inability: Not on file  . Transportation needs    Medical: Not on file    Non-medical: Not on file  Tobacco Use  . Smoking status: Former Smoker    Packs/day: 0.25    Years: 25.00    Pack years: 6.25    Quit date: 03/19/2015    Years since quitting: 4.6  . Smokeless tobacco: Never Used  . 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  [name] married with 2 children.  Substance and Sexual Activity  . Alcohol use: Yes    Alcohol/week: 0.0 standard drinks    Frequency: Monthly or less    Drinks per session: 1 or 2    Binge frequency: Never    Comment: occasional, during holidays  . Drug use: No  . Sexual activity: Defer  Other Topics Concern  . Not on file  Social History Narrative  . Not on file      PHYSICAL EXAM:    Vitals:  11/14/19 0905  BP: 97/64  Pulse: 63   Body mass index is 42.48 kg/m. Weight: (!) 107 kg (236 lb)    GENERAL: Alert, active, oriented x3  HEENT: Pupils equal reactive to light. Extraocular movements are intact. Sclera clear. Palpebral conjunctiva normal red color.Pharynx clear.  NECK: Supple with no palpable mass and no adenopathy.  LUNGS: Sound clear with no rales rhonchi or wheezes.  HEART: Regular rhythm S1 and S2 without murmur.  BREAST: Right breast with induration on the biopsy area but no identifiable palpable mass.  There is no skin changes.  There is no nipple retraction.  There is no nipple discharge.  There is no palpable adenopathy in the axillary area.  Left breast without palpable mass, no skin changes, no nipple retraction, no nipple discharge.  There is no axillary adenopathy.  ABDOMEN: Soft and depressible, nontender with no palpable mass, no hepatomegaly.  EXTREMITIES: Well-developed well-nourished symmetrical with no dependent edema.  NEUROLOGICAL: Awake alert oriented, facial expression symmetrical, moving all extremities.  REVIEW OF DATA: I have reviewed the following data today:      Appointment on 10/28/2019  Component Date Value  . Prothrombin Time 10/28/2019 56.0*  . Prothrombin INR 10/28/2019 5.0*  . WBC (White Blood Cell Co* 10/28/2019 7.0   . RBC (Red Blood Cell Coun* 10/28/2019 3.87*  . Hemoglobin 10/28/2019 11.4*  . Hematocrit 10/28/2019 36.6   . MCV (Mean Corpuscular Vo* 10/28/2019 94.6   . MCH (Mean Corpuscular He* 10/28/2019 29.5   . MCHC (Mean Corpuscular H* 10/28/2019 31.1*  . Platelet Count 10/28/2019 254   . RDW-CV (Red Cell Distrib* 10/28/2019 15.9*  . MPV (Mean Platelet Volum* 10/28/2019 10.7   . Neutrophils 10/28/2019 4.49   . Lymphocytes 10/28/2019 1.62   . Monocytes 10/28/2019 0.57   . Eosinophils 10/28/2019 0.19   . Basophils 10/28/2019 0.03   . Neutrophil % 10/28/2019 64.5   . Lymphocyte % 10/28/2019 23.2   . Monocyte % 10/28/2019 8.2   . Eosinophil % 10/28/2019 2.7   . Basophil% 10/28/2019 0.4   . Immature  Granulocyte % 10/28/2019 1.0*  . Immature Granulocyte Cou* 10/28/2019 0.07*  . Glucose 10/28/2019 156*  . Sodium 10/28/2019 141   . Potassium 10/28/2019 4.1   . Chloride 10/28/2019 111*  . Carbon Dioxide (CO2) 10/28/2019 23.1   . Urea Nitrogen (BUN) 10/28/2019 8   . Creatinine 10/28/2019 0.9   . Glomerular Filtration Ra* 10/28/2019 81   . Calcium 10/28/2019 9.2   . AST  10/28/2019 14   . ALT  10/28/2019 9   . Alk Phos (alkaline Phosp* 10/28/2019 70   . Albumin 10/28/2019 4.0   . Bilirubin, Total 10/28/2019 0.4   . Protein, Total 10/28/2019 7.2   . A/G Ratio 10/28/2019 1.3   . Hemoglobin A1C 10/28/2019 6.5*  . Average Blood Glucose (C* 10/28/2019 140   . Urine Albumin, Random 10/28/2019 7   . Cholesterol, Total 10/28/2019 136   . Triglyceride 10/28/2019 140   . HDL (High Density Lipopr* 10/28/2019 37.0   . LDL Calculated 10/28/2019 71   . VLDL Cholesterol 10/28/2019 28   . Cholesterol/HDL Ratio 10/28/2019 3.7   . Thyroid Stimulating Horm* 10/28/2019 2.535   . Color 10/28/2019 Yellow   . Clarity 10/28/2019 Clear   . Specific Gravity 10/28/2019 1.010   . pH, Urine 10/28/2019 5.0   . Protein, Urinalysis 10/28/2019 Negative   . Glucose, Urinalysis 10/28/2019 Negative   . Ketones, Urinalysis 10/28/2019 Negative   . Blood,  Urinalysis 10/28/2019 Small*  . Nitrite, Urinalysis 10/28/2019 Negative   . Leukocyte Esterase, Urin* 10/28/2019 Negative   . White Blood Cells, Urina* 10/28/2019 0-3   . Red Blood Cells, Urinaly* 10/28/2019 0-3   . Bacteria, Urinalysis 10/28/2019 Rare*  . Squamous Epithelial Cell* 10/28/2019 Few   . Prothrombin Time 10/31/2019 21.0*  . Prothrombin INR 10/31/2019 1.8*  Appointment on 09/26/2019  Component Date Value  . Prothrombin Time 09/26/2019 32.0*  . Prothrombin INR 09/26/2019 2.8   Appointment on 08/22/2019  Component Date Value  . Prothrombin Time 08/22/2019 32.5*  . Prothrombin INR 08/22/2019 2.9      ASSESSMENT: Ms. Bentley is a 48 y.o.  female presenting for consultation for right breast sclerosing adenoma x2.    Patient was oriented again about the pathology results. Surgical alternatives were discussed with patient including partial vs total mastectomy. Surgical technique and post operative care was discussed with patient. Risk of surgery was discussed with patient including but not limited to: wound infection, seroma, hematoma, brachial plexopathy, mondor's disease (thrombosis of small veins of breast), chronic wound pain, breast lymphedema, altered sensation to the nipple and cosmesis among others.   Patient with high risk of bleeding due to warfarin.  Patient had warfarin due to heart valve replacement.  Patient has bridge to warfarin before for other procedures.  Will contact radiology for recommendation for warfarin bridging before the surgery.  Recommendation will be to stop warfarin 5 days before with Lovenox bridging.  Radial scar of breast [N64.89]  PLAN: 1.  Needle guided excisional biopsy x2 (19125) 2.  We will contact your cardiologist for recommendations regarding holding warfarin and bridging with Lovenox. 3.  CBC, CMP done on 10/28/2019 4.  PT/INR on the day of surgery 5. Contact us if has any question or concern.   Patient verbalized understanding, all questions were answered, and were agreeable with the plan outlined above.   Herbert Pun, MD  Electronically signed by Herbert Pun, MD

## 2019-11-14 NOTE — H&P (View-Only) (Signed)
PATIENT PROFILE: Shelly Huynh is a 48 y.o. female who presents to the Clinic for consultation at the request of Dr. Ginette Pitman for evaluation of right breast complex sclerosing lesion.  PCP:  Azzie Glatter, MD  HISTORY OF PRESENT ILLNESS: [name] reports she had her screening mammogram as usual.  He denies any palpable mass, skin changes, nipple retraction, nipple discharge before straining mammogram.  She denies any breast pain.  There is no pain radiation.  There is no alleviating or aggravating factor.  Her mammogram shows 2 lesions with microcalcifications on the upper outer quadrant of the right breast.  There was suspicious for malignancy.  Diagnostic mammogram confirmed this lesions.  No lesions were appreciated on ultrasound.  This led to stereotactic core needle biopsy.  Biopsy showed complete sclerosing lesion without atypia.  Focus of radial scar concerning on one of the lesions.  On the left breast there was complex cyst with benign features.  79-monthfollow-up was recommended for this cyst.  Family history of breast cancer: Paternal grandmother Family history of other cancers: Paternal grandmother with lung cancer, mother with thyroid cancer Menarche: 89years old Menopause: 48years old due to hysterectomy for fibroids Used OCP: Yes Used estrogen and progesterone therapy: No History of Radiation to the chest: No Number of pregnancies: 4 (2 completed pregnancies) Previous biopsy: 1 use biopsy on the right breast, 1 biopsy on the the left breast  PROBLEM LIST:         Problem List  Date Reviewed: 06/25/2019         Noted   Cirrhosis of liver without ascites (CMS-HCC) 10/28/2019   Lumbar radicular pain 10/28/2019   Elevated hemoglobin A1c 10/28/2019   Neck pain, bilateral 10/22/2019   Bilateral hand numbness 10/22/2019   SOBOE (shortness of breath on exertion) 05/17/2019   BMI 40.0-44.9, adult (CMS-HCC) (Chronic) 04/10/2019   Irritable bowel syndrome with  constipation 12/26/2018   Bariatric surgery status 09/12/2018   Hypertension due to endocrine disorder 08/17/2018   Hypertension, well controlled 08/14/2018   Hyperaldosteronism (CMS-HCC) 06/24/2018   Cervical radiculopathy 06/12/2018   Anxiety state 04/02/2018   Hypokalemia 12/24/2017   Pleural effusion 10/02/2017   Mitral valve disease, 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 Jude MVR 2016      SOBOE (shortness of breath on exertion) 04/24/2017   Diabetes mellitus (CMS-HCC) 09/07/2016   GERD with esophagitis 09/07/2016   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CMS-HCC) 09/07/2016   Pure hypercholesterolemia 08/08/2016   Osteoarthritis of spine with radiculopathy, lumbar region 08/08/2016   Vitamin D deficiency 05/17/2016   Diabetes mellitus type 2, diet-controlled (CMS-HCC) 05/17/2016   Spondylosis of lumbar region without myelopathy or radiculopathy 10/12/2015   Gastroesophageal reflux disease without esophagitis 10/12/2015   Nicotine dependence, uncomplicated 81/02/3887  Chronic hypokalemia 06/15/2015   Palpitations 05/18/2015   Long term current use of anticoagulants with INR goal of 2.5-3.5 05/11/2015   Mixed hyperlipidemia 05/11/2015   Chronic anticoagulation 05/07/2015   Overview    Coumadin anti-coagulation therapy mechanical heart valve prosthesis with goal INR range 2.5-3.5      Acute postoperative pain 04/29/2015   Postoperative anemia 04/26/2015   Acute post-operative pain 04/10/2015   S/P heart valve replacement with mechanical valve 04/07/2015   S/P AVR (aortic valve replacement) 04/07/2015   S/P TVR (tricuspid valve replacement) 04/07/2015   S/P MVR (mitral valve replacement) 04/07/2015   H/O mechanical aortic valve replacement 04/03/2015   Overview  19 mm Saint Jude aortic valve prosthesis      H/O mitral valve replacement with mechanical valve 04/03/2015   Overview    25 mm Saint Jude mechanical mitral valve prosthesis       History of tricuspid valve replacement with bioprosthetic valve 04/03/2015   Overview    29 mm Mosaic biologic tricuspid valve prosthesis      Presence of prosthetic heart valve 04/03/2015   Overview    29 mm Mosaic biologic tricuspid valve prosthesis      Avoidance of blood transfusions except in life-threatening or emergency situations. 03/12/2015   Overview     See [facility] for Blood Conservation Consult note.       Coronary artery disease due to calcified coronary lesion (Chronic) 03/04/2015   Overview    1. As determined by CT scan with Dr. Nehemiah Massed (Cardiology)      OSA (obstructive sleep apnea) 10/28/2014   Enlarged heart 10/28/2014   Obesity Unknown      GENERAL REVIEW OF SYSTEMS:   General ROS: negative for - chills, fatigue, fever, weight gain or weight loss Allergy and Immunology ROS: negative for - hives  Hematological and Lymphatic ROS: negative for - bleeding problems or bruising, negative for palpable nodes Endocrine ROS: negative for - heat or cold intolerance, hair changes Respiratory ROS: negative for - cough, shortness of breath or wheezing Cardiovascular ROS: no chest pain or palpitations GI ROS: negative for nausea, vomiting, abdominal pain, diarrhea, constipation Musculoskeletal ROS: negative for - joint swelling or muscle pain Neurological ROS: negative for - confusion, syncope Dermatological ROS: negative for pruritus and rash Psychiatric: negative for anxiety, depression, difficulty sleeping and memory loss  MEDICATIONS: Current Medications        Current Outpatient Medications  Medication Sig Dispense Refill  . albuterol 90 mcg/actuation inhaler Inhale 2 inhalations into the lungs every 6 (six) hours as needed for Wheezing 1 Inhaler 5  . aspirin 81 MG EC tablet Take 1 tablet (81 mg total) by mouth once daily 90 tablet 3  . candesartan (ATACAND) 8 MG tablet Take 1 tablet (8 mg total) by mouth once daily 90 tablet  3  . carvediloL (COREG) 3.125 MG tablet Take 1 tablet (3.125 mg total) by mouth 2 (two) times daily with meals 180 tablet 3  . cyclobenzaprine (FLEXERIL) 10 MG tablet Take 1 tablet (10 mg total) by mouth 3 (three) times daily as needed 30 tablet 1  . diazePAM (VALIUM) 5 MG tablet diazepam 5 mg tablet    . docusate sodium (COLACE ORAL) Take 1 capsule by mouth once daily as needed      . DULoxetine (CYMBALTA) 30 MG DR capsule Take 1 capsule (30 mg total) by mouth once daily 30 capsule 5  . fluticasone propionate (FLONASE) 50 mcg/actuation nasal spray Place 2 sprays into both nostrils 2 (two) times daily 16 mL 5  . gabapentin (NEURONTIN) 300 MG capsule Take 1 capsule (300 mg total) by mouth nightly 90 capsule 1  . Herbal Supplement Blackseed Oil  1 teaspoon daily    . hydrocortisone 2.5 % cream APPLY A THIN LAYER TO FACE BID PRN FOR 1 TO 2 WEEKS    . iron polysaccharides (FERREX) 150 mg iron capsule Take 1 capsule (150 mg total) by mouth once daily 90 capsule 3  . levalbuterol (XOPENEX) 1.25 mg/3 mL nebulizer solution levalbuterol 1.25 mg/3 mL solution for nebulization    . levocetirizine (XYZAL) 5 MG tablet TAKE 1 TABLET(5 MG) BY  MOUTH EVERY EVENING 90 tablet 1  . linaCLOtide (LINZESS) 290 mcg capsule Take 1 capsule (290 mcg total) by mouth once daily 10 capsule 2  . meclizine (ANTIVERT) 25 mg tablet Take 1 tablet (25 mg total) by mouth every 6 (six) hours as needed 30 tablet 3  . minoxidiL (ROGAINE) 5 % Foam APPLY 1 ML TO SCALP DAILY    . ONETOUCH VERIO test strip USE TO CHECK BLOOD SUGARS 3 TIMES DAILY IN THE MORNING  FASTING BEFORE LUNCH AND  BEFORE BEDTIME 300 strip 3  . pantoprazole (PROTONIX) 40 MG DR tablet Take 1 tablet (40 mg total) by mouth 2 (two) times daily 180 tablet 1  . potassium chloride (MICRO-K) 10 MEQ ER capsule TAKE 1 CAPSULE BY MOUTH  ONCE DAILY TAKE 4 CAPSULES  ONLY WHEN TAKING FUROSEMIDE 90 capsule 1  . pravastatin (PRAVACHOL) 20 MG tablet Take 1 tablet (20 mg  total) by mouth once daily 90 tablet 1  . spironolactone (ALDACTONE) 25 MG tablet Take 1 tablet (25 mg total) by mouth once daily Take with 50 mg tablet = 75 mg daily. 90 tablet 1  . spironolactone (ALDACTONE) 50 MG tablet Take 1 tablet (50 mg total) by mouth once daily Take with 25 mg tablet = 75 mg daily. 90 tablet 1  . warfarin (COUMADIN) 6 MG tablet TAKE 2 TABLETS BY MOUTH  ONCE DAILY 180 tablet 2  . warfarin (COUMADIN) 7.5 MG tablet Take 1 tablet (7.5 mg total) by mouth once daily 90 tablet 1   No current facility-administered medications for this visit.       ALLERGIES: Iodinated contrast media, Sulfa (sulfonamide antibiotics), Keflex [cephalexin], Betadine [povidone-iodine], Lisinopril, Losartan, and Mucinex [guaifenesin]  PAST MEDICAL HISTORY:     Past Medical History:  Diagnosis Date  . 3-vessel CAD 09/13/2014   By cat scan   . Allergy   . Anxiety   . Aortic valve stenosis and insufficiency, rheumatic 01/15/2015  . Arthritis   . Asthma without status asthmaticus, unspecified   . ATN (acute tubular necrosis) (CMS-HCC)   . CHF (congestive heart failure) (CMS-HCC)   . Chronic anticoagulation 05/07/2015   Coumadin anti-coagulation therapy mechanical heart valve prosthesis with goal INR range 2.5-3.5   . Chronic diastolic heart failure, NYHA class 2 (CMS-HCC) 03/04/2015  . Coronary artery disease due to calcified coronary lesion 03/04/2015   1. As determined by CT scan with Dr. Nehemiah Massed (Cardiology)  . Current smoker 11/10/2016  . Depression   . Diabetes mellitus type 2, diet-controlled (CMS-HCC) 05/17/2016  . Encounter for blood transfusion   . Essential hypertension 09/11/2014  . Fever in adult 07/08/2017  . GERD (gastroesophageal reflux disease)   . H/O mechanical aortic valve replacement 04/03/2015   19 mm Saint Jude aortic valve prosthesis  . H/O mitral valve replacement with mechanical valve 04/03/2015   25 mm Saint Jude mechanical mitral valve prosthesis   . Hepatic disease   . History of tricuspid valve replacement with bioprosthetic valve 04/03/2015   29 mm Mosaic biologic tricuspid valve prosthesis  . Hyperlipemia, mixed 09/11/2014  . Hyperlipidemia   . Hypertension   . Long term current use of anticoagulants with INR goal of 2.5-3.5 05/11/2015  . Mitral stenosis with insufficiency, 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  . Moderate tricuspid regurgitation 01/15/2015  . Obesity   . OSA (obstructive sleep apnea) 10/28/2014  . Postoperative anemia   . Postoperative infection of wound of sternum 04/24/2015  . Postoperative infection of wound of sternum, subsequent encounter 04/24/2015  .  Refusal of blood transfusions as patient is Jehovah's Witness 03/12/2015  . Shortness of breath 10/28/2014  . Sleep apnea   . Tobacco abuse   . Tobacco abuse     PAST SURGICAL HISTORY:      Past Surgical History:  Procedure Laterality Date  . Cardiac Catherization  12/05/2014  . CARDIAC VALVE REPLACEMENT  04/02/2015  . [place] and 2000  . HERNIA REPAIR  09/2004  . HYSTERECTOMY VAGINAL  2009  . LIPOSUCTION TRUNK  1996  . REPLACEMENT AORTIC VALVE N/A 04/03/2015   Procedure: REPLACEMENT AORTIC VALVE;  Surgeon: Elna Breslow, MD;  Location: [facility];  Service: Cardiothoracic;  Laterality: N/A;  . REPLACEMENT MITRAL VALVE N/A 04/03/2015   Procedure: REPLACEMENT MITRAL VALVE;  Surgeon: Elna Breslow, MD;  Location: [facility];  Service: Cardiothoracic;  Laterality: N/A;  . REPLACEMENT TRICUSPID VALVE N/A 04/03/2015   Procedure: REPLACEMENT TRICUSPID VALVE ;  Surgeon: Elna Breslow, MD;  Location: [facility];  Service: Cardiothoracic;  Laterality: N/A;  . STERNOTOMY MEDIAN N/A 04/03/2015   Procedure: STERNOTOMY MEDIAN;  Surgeon: Elna Breslow, MD;  Location: [facility];  Service: Cardiothoracic;  Laterality: N/A;  . TUBAL LIGATION  08/1999  . UMBILICAL HERNIA REPAIR  2005     FAMILY HISTORY:       Family History  Problem Relation Age of Onset  . Uterine cancer Mother   . Thyroid disease Mother   . Thyroid cancer Mother   . No Known Problems Father   . Coronary Artery Disease (Blocked arteries around heart) Other        grandparents  . Breast cancer Paternal Grandmother   . Coronary Artery Disease (Blocked arteries around heart) Paternal Grandmother   . High blood pressure (Hypertension) Paternal Grandmother   . Thyroid disease Paternal Grandmother   . Coronary Artery Disease (Blocked arteries around heart) Maternal Grandmother   . High blood pressure (Hypertension) Maternal Grandmother      SOCIAL HISTORY: Social History          Socioeconomic History  . Marital status: Married    Spouse name: Not on file  . Number of children: Not on file  . Years of education: Not on file  . Highest education level: Not on file  Occupational History  . Not on file  Social Needs  . Financial resource strain: Not on file  . Food insecurity    Worry: Not on file    Inability: Not on file  . Transportation needs    Medical: Not on file    Non-medical: Not on file  Tobacco Use  . Smoking status: Former Smoker    Packs/day: 0.25    Years: 25.00    Pack years: 6.25    Quit date: 03/19/2015    Years since quitting: 4.6  . Smokeless tobacco: Never Used  . 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  [name] married with 2 children.  Substance and Sexual Activity  . Alcohol use: Yes    Alcohol/week: 0.0 standard drinks    Frequency: Monthly or less    Drinks per session: 1 or 2    Binge frequency: Never    Comment: occasional, during holidays  . Drug use: No  . Sexual activity: Defer  Other Topics Concern  . Not on file  Social History Narrative  . Not on file      PHYSICAL EXAM:    Vitals:  11/14/19 0905  BP: 97/64  Pulse: 63   Body mass index is 42.48 kg/m. Weight: (!) 107 kg (236 lb)    GENERAL: Alert, active, oriented x3  HEENT: Pupils equal reactive to light. Extraocular movements are intact. Sclera clear. Palpebral conjunctiva normal red color.Pharynx clear.  NECK: Supple with no palpable mass and no adenopathy.  LUNGS: Sound clear with no rales rhonchi or wheezes.  HEART: Regular rhythm S1 and S2 without murmur.  BREAST: Right breast with induration on the biopsy area but no identifiable palpable mass.  There is no skin changes.  There is no nipple retraction.  There is no nipple discharge.  There is no palpable adenopathy in the axillary area.  Left breast without palpable mass, no skin changes, no nipple retraction, no nipple discharge.  There is no axillary adenopathy.  ABDOMEN: Soft and depressible, nontender with no palpable mass, no hepatomegaly.  EXTREMITIES: Well-developed well-nourished symmetrical with no dependent edema.  NEUROLOGICAL: Awake alert oriented, facial expression symmetrical, moving all extremities.  REVIEW OF DATA: I have reviewed the following data today:      Appointment on 10/28/2019  Component Date Value  . Prothrombin Time 10/28/2019 56.0*  . Prothrombin INR 10/28/2019 5.0*  . WBC (White Blood Cell Co* 10/28/2019 7.0   . RBC (Red Blood Cell Coun* 10/28/2019 3.87*  . Hemoglobin 10/28/2019 11.4*  . Hematocrit 10/28/2019 36.6   . MCV (Mean Corpuscular Vo* 10/28/2019 94.6   . MCH (Mean Corpuscular He* 10/28/2019 29.5   . MCHC (Mean Corpuscular H* 10/28/2019 31.1*  . Platelet Count 10/28/2019 254   . RDW-CV (Red Cell Distrib* 10/28/2019 15.9*  . MPV (Mean Platelet Volum* 10/28/2019 10.7   . Neutrophils 10/28/2019 4.49   . Lymphocytes 10/28/2019 1.62   . Monocytes 10/28/2019 0.57   . Eosinophils 10/28/2019 0.19   . Basophils 10/28/2019 0.03   . Neutrophil % 10/28/2019 64.5   . Lymphocyte % 10/28/2019 23.2   . Monocyte % 10/28/2019 8.2   . Eosinophil % 10/28/2019 2.7   . Basophil% 10/28/2019 0.4   . Immature  Granulocyte % 10/28/2019 1.0*  . Immature Granulocyte Cou* 10/28/2019 0.07*  . Glucose 10/28/2019 156*  . Sodium 10/28/2019 141   . Potassium 10/28/2019 4.1   . Chloride 10/28/2019 111*  . Carbon Dioxide (CO2) 10/28/2019 23.1   . Urea Nitrogen (BUN) 10/28/2019 8   . Creatinine 10/28/2019 0.9   . Glomerular Filtration Ra* 10/28/2019 81   . Calcium 10/28/2019 9.2   . AST  10/28/2019 14   . ALT  10/28/2019 9   . Alk Phos (alkaline Phosp* 10/28/2019 70   . Albumin 10/28/2019 4.0   . Bilirubin, Total 10/28/2019 0.4   . Protein, Total 10/28/2019 7.2   . A/G Ratio 10/28/2019 1.3   . Hemoglobin A1C 10/28/2019 6.5*  . Average Blood Glucose (C* 10/28/2019 140   . Urine Albumin, Random 10/28/2019 7   . Cholesterol, Total 10/28/2019 136   . Triglyceride 10/28/2019 140   . HDL (High Density Lipopr* 10/28/2019 37.0   . LDL Calculated 10/28/2019 71   . VLDL Cholesterol 10/28/2019 28   . Cholesterol/HDL Ratio 10/28/2019 3.7   . Thyroid Stimulating Horm* 10/28/2019 2.535   . Color 10/28/2019 Yellow   . Clarity 10/28/2019 Clear   . Specific Gravity 10/28/2019 1.010   . pH, Urine 10/28/2019 5.0   . Protein, Urinalysis 10/28/2019 Negative   . Glucose, Urinalysis 10/28/2019 Negative   . Ketones, Urinalysis 10/28/2019 Negative   . Blood,  Urinalysis 10/28/2019 Small*  . Nitrite, Urinalysis 10/28/2019 Negative   . Leukocyte Esterase, Urin* 10/28/2019 Negative   . White Blood Cells, Urina* 10/28/2019 0-3   . Red Blood Cells, Urinaly* 10/28/2019 0-3   . Bacteria, Urinalysis 10/28/2019 Rare*  . Squamous Epithelial Cell* 10/28/2019 Few   . Prothrombin Time 10/31/2019 21.0*  . Prothrombin INR 10/31/2019 1.8*  Appointment on 09/26/2019  Component Date Value  . Prothrombin Time 09/26/2019 32.0*  . Prothrombin INR 09/26/2019 2.8   Appointment on 08/22/2019  Component Date Value  . Prothrombin Time 08/22/2019 32.5*  . Prothrombin INR 08/22/2019 2.9      ASSESSMENT: Ms. Bentley is a 48 y.o.  female presenting for consultation for right breast sclerosing adenoma x2.    Patient was oriented again about the pathology results. Surgical alternatives were discussed with patient including partial vs total mastectomy. Surgical technique and post operative care was discussed with patient. Risk of surgery was discussed with patient including but not limited to: wound infection, seroma, hematoma, brachial plexopathy, mondor's disease (thrombosis of small veins of breast), chronic wound pain, breast lymphedema, altered sensation to the nipple and cosmesis among others.   Patient with high risk of bleeding due to warfarin.  Patient had warfarin due to heart valve replacement.  Patient has bridge to warfarin before for other procedures.  Will contact radiology for recommendation for warfarin bridging before the surgery.  Recommendation will be to stop warfarin 5 days before with Lovenox bridging.  Radial scar of breast [N64.89]  PLAN: 1.  Needle guided excisional biopsy x2 (19125) 2.  We will contact your cardiologist for recommendations regarding holding warfarin and bridging with Lovenox. 3.  CBC, CMP done on 10/28/2019 4.  PT/INR on the day of surgery 5. Contact us if has any question or concern.   Patient verbalized understanding, all questions were answered, and were agreeable with the plan outlined above.   Herbert Pun, MD  Electronically signed by Herbert Pun, MD

## 2019-11-18 ENCOUNTER — Other Ambulatory Visit
Admission: RE | Admit: 2019-11-18 | Discharge: 2019-11-18 | Disposition: A | Discharge status: Home / Self Care | Payer: Managed Care, Other (non HMO) | Source: Ambulatory Visit | Attending: General Surgery | Admitting: General Surgery

## 2019-11-18 ENCOUNTER — Other Ambulatory Visit: Payer: Self-pay | Admitting: General Surgery

## 2019-11-18 ENCOUNTER — Other Ambulatory Visit: Payer: Self-pay

## 2019-11-18 DIAGNOSIS — N6489 Other specified disorders of breast: Secondary | ICD-10-CM

## 2019-11-19 ENCOUNTER — Other Ambulatory Visit
Admission: RE | Admit: 2019-11-19 | Discharge: 2019-11-19 | Disposition: A | Discharge status: Home / Self Care | Payer: Managed Care, Other (non HMO) | Source: Ambulatory Visit | Attending: General Surgery | Admitting: General Surgery

## 2019-11-19 DIAGNOSIS — Z20828 Contact with and (suspected) exposure to other viral communicable diseases: Secondary | ICD-10-CM | POA: Insufficient documentation

## 2019-11-19 DIAGNOSIS — Z01812 Encounter for preprocedural laboratory examination: Secondary | ICD-10-CM | POA: Insufficient documentation

## 2019-11-19 LAB — SARS CORONAVIRUS 2 (TAT 6-24 HRS): SARS Coronavirus 2: NEGATIVE

## 2019-11-19 MED ORDER — LACTATED RINGERS IV SOLN: INTRAVENOUS | Status: DC

## 2019-11-20 ENCOUNTER — Other Ambulatory Visit: Payer: Self-pay

## 2019-11-20 ENCOUNTER — Ambulatory Visit: Payer: Managed Care, Other (non HMO) | Admitting: Anesthesiology

## 2019-11-20 ENCOUNTER — Encounter
Admission: RE | Disposition: A | Discharge status: Home / Self Care | Payer: Self-pay | Source: Home / Self Care | Attending: General Surgery

## 2019-11-20 ENCOUNTER — Ambulatory Visit
Admission: RE | Admit: 2019-11-20 | Discharge: 2019-11-20 | Disposition: A | Discharge status: Home / Self Care | Payer: Managed Care, Other (non HMO) | Source: Ambulatory Visit | Attending: General Surgery | Admitting: General Surgery

## 2019-11-20 ENCOUNTER — Ambulatory Visit
Admission: RE | Admit: 2019-11-20 | Discharge: 2019-11-20 | Disposition: A | Discharge status: Home / Self Care | Payer: Managed Care, Other (non HMO) | Attending: General Surgery | Admitting: General Surgery

## 2019-11-20 ENCOUNTER — Encounter: Payer: Self-pay | Admitting: General Surgery

## 2019-11-20 DIAGNOSIS — M5412 Radiculopathy, cervical region: Secondary | ICD-10-CM | POA: Diagnosis not present

## 2019-11-20 DIAGNOSIS — Z888 Allergy status to other drugs, medicaments and biological substances status: Secondary | ICD-10-CM | POA: Diagnosis not present

## 2019-11-20 DIAGNOSIS — I5032 Chronic diastolic (congestive) heart failure: Secondary | ICD-10-CM | POA: Insufficient documentation

## 2019-11-20 DIAGNOSIS — Z882 Allergy status to sulfonamides status: Secondary | ICD-10-CM | POA: Insufficient documentation

## 2019-11-20 DIAGNOSIS — Z7982 Long term (current) use of aspirin: Secondary | ICD-10-CM | POA: Diagnosis not present

## 2019-11-20 DIAGNOSIS — I110 Hypertensive heart disease with heart failure: Secondary | ICD-10-CM | POA: Insufficient documentation

## 2019-11-20 DIAGNOSIS — M1990 Unspecified osteoarthritis, unspecified site: Secondary | ICD-10-CM | POA: Insufficient documentation

## 2019-11-20 DIAGNOSIS — Z79899 Other long term (current) drug therapy: Secondary | ICD-10-CM | POA: Diagnosis not present

## 2019-11-20 DIAGNOSIS — K219 Gastro-esophageal reflux disease without esophagitis: Secondary | ICD-10-CM | POA: Insufficient documentation

## 2019-11-20 DIAGNOSIS — G4730 Sleep apnea, unspecified: Secondary | ICD-10-CM | POA: Diagnosis not present

## 2019-11-20 DIAGNOSIS — E782 Mixed hyperlipidemia: Secondary | ICD-10-CM | POA: Insufficient documentation

## 2019-11-20 DIAGNOSIS — G709 Myoneural disorder, unspecified: Secondary | ICD-10-CM | POA: Diagnosis not present

## 2019-11-20 DIAGNOSIS — Z91041 Radiographic dye allergy status: Secondary | ICD-10-CM | POA: Insufficient documentation

## 2019-11-20 DIAGNOSIS — Z801 Family history of malignant neoplasm of trachea, bronchus and lung: Secondary | ICD-10-CM | POA: Insufficient documentation

## 2019-11-20 DIAGNOSIS — Z8349 Family history of other endocrine, nutritional and metabolic diseases: Secondary | ICD-10-CM | POA: Insufficient documentation

## 2019-11-20 DIAGNOSIS — Z881 Allergy status to other antibiotic agents status: Secondary | ICD-10-CM | POA: Insufficient documentation

## 2019-11-20 DIAGNOSIS — Z952 Presence of prosthetic heart valve: Secondary | ICD-10-CM | POA: Insufficient documentation

## 2019-11-20 DIAGNOSIS — N9089 Other specified noninflammatory disorders of vulva and perineum: Secondary | ICD-10-CM | POA: Insufficient documentation

## 2019-11-20 DIAGNOSIS — Z87891 Personal history of nicotine dependence: Secondary | ICD-10-CM | POA: Diagnosis not present

## 2019-11-20 DIAGNOSIS — Z8249 Family history of ischemic heart disease and other diseases of the circulatory system: Secondary | ICD-10-CM | POA: Insufficient documentation

## 2019-11-20 DIAGNOSIS — N6489 Other specified disorders of breast: Secondary | ICD-10-CM

## 2019-11-20 DIAGNOSIS — I2510 Atherosclerotic heart disease of native coronary artery without angina pectoris: Secondary | ICD-10-CM | POA: Insufficient documentation

## 2019-11-20 DIAGNOSIS — Z803 Family history of malignant neoplasm of breast: Secondary | ICD-10-CM | POA: Insufficient documentation

## 2019-11-20 DIAGNOSIS — K7460 Unspecified cirrhosis of liver: Secondary | ICD-10-CM | POA: Insufficient documentation

## 2019-11-20 DIAGNOSIS — F419 Anxiety disorder, unspecified: Secondary | ICD-10-CM | POA: Diagnosis not present

## 2019-11-20 DIAGNOSIS — E118 Type 2 diabetes mellitus with unspecified complications: Secondary | ICD-10-CM | POA: Diagnosis not present

## 2019-11-20 DIAGNOSIS — Z9884 Bariatric surgery status: Secondary | ICD-10-CM | POA: Diagnosis not present

## 2019-11-20 DIAGNOSIS — Z90710 Acquired absence of both cervix and uterus: Secondary | ICD-10-CM | POA: Insufficient documentation

## 2019-11-20 DIAGNOSIS — Z808 Family history of malignant neoplasm of other organs or systems: Secondary | ICD-10-CM | POA: Diagnosis not present

## 2019-11-20 DIAGNOSIS — N6021 Fibroadenosis of right breast: Secondary | ICD-10-CM | POA: Insufficient documentation

## 2019-11-20 DIAGNOSIS — Z8049 Family history of malignant neoplasm of other genital organs: Secondary | ICD-10-CM | POA: Insufficient documentation

## 2019-11-20 DIAGNOSIS — N6310 Unspecified lump in the right breast, unspecified quadrant: Secondary | ICD-10-CM | POA: Diagnosis present

## 2019-11-20 HISTORY — PX: BREAST LUMPECTOMY: SHX2

## 2019-11-20 HISTORY — PX: BREAST BIOPSY: SHX20

## 2019-11-20 LAB — GLUCOSE, CAPILLARY
Glucose-Capillary: 89 mg/dL (ref 70–99)
Glucose-Capillary: 102 mg/dL — ABNORMAL HIGH (ref 70–99)

## 2019-11-20 SURGERY — BREAST BIOPSY WITH NEEDLE LOCALIZATION
Anesthesia: General | Site: Breast | Laterality: Right

## 2019-11-20 MED ORDER — CEFAZOLIN SODIUM-DEXTROSE 2-4 GM/100ML-% IV SOLN
2.0000 g | INTRAVENOUS | Status: AC
  Administered 2019-11-20: 2 g via INTRAVENOUS

## 2019-11-20 MED ORDER — GLYCOPYRROLATE 0.2 MG/ML IJ SOLN
INTRAMUSCULAR | Status: AC
  Filled 2019-11-20: qty 1

## 2019-11-20 MED ORDER — OXYCODONE HCL 5 MG PO TABS: 5.0000 mg | ORAL_TABLET | Freq: Once | ORAL | Status: DC | PRN

## 2019-11-20 MED ORDER — SODIUM CHLORIDE 0.9 % IV SOLN
INTRAVENOUS | Status: DC | PRN
  Administered 2019-11-20: 10:00:00 50 ug/min via INTRAVENOUS

## 2019-11-20 MED ORDER — MIDAZOLAM HCL 2 MG/2ML IJ SOLN
INTRAMUSCULAR | Status: DC | PRN
  Administered 2019-11-20: 2 mg via INTRAVENOUS

## 2019-11-20 MED ORDER — OXYCODONE HCL 5 MG/5ML PO SOLN: 5.0000 mg | Freq: Once | ORAL | Status: DC | PRN

## 2019-11-20 MED ORDER — ONDANSETRON HCL 4 MG/2ML IJ SOLN
INTRAMUSCULAR | Status: DC | PRN
  Administered 2019-11-20: 4 mg via INTRAVENOUS

## 2019-11-20 MED ORDER — DEXAMETHASONE SODIUM PHOSPHATE 10 MG/ML IJ SOLN
INTRAMUSCULAR | Status: DC | PRN
  Administered 2019-11-20: 5 mg via INTRAVENOUS

## 2019-11-20 MED ORDER — BUPIVACAINE-EPINEPHRINE (PF) 0.5% -1:200000 IJ SOLN
INTRAMUSCULAR | Status: DC | PRN
  Administered 2019-11-20: 20 mL

## 2019-11-20 MED ORDER — EPHEDRINE SULFATE 50 MG/ML IJ SOLN
INTRAMUSCULAR | Status: AC
  Filled 2019-11-20: qty 1

## 2019-11-20 MED ORDER — FENTANYL CITRATE (PF) 100 MCG/2ML IJ SOLN
INTRAMUSCULAR | Status: DC | PRN
  Administered 2019-11-20 (×2): 25 ug via INTRAVENOUS
  Administered 2019-11-20: 50 ug via INTRAVENOUS
  Administered 2019-11-20 (×4): 25 ug via INTRAVENOUS

## 2019-11-20 MED ORDER — EPHEDRINE SULFATE 50 MG/ML IJ SOLN
INTRAMUSCULAR | Status: DC | PRN
  Administered 2019-11-20 (×2): 10 mg via INTRAVENOUS

## 2019-11-20 MED ORDER — PROPOFOL 10 MG/ML IV BOLUS
INTRAVENOUS | Status: AC
  Filled 2019-11-20: qty 20

## 2019-11-20 MED ORDER — TRAMADOL HCL 50 MG PO TABS: 50.0000 mg | ORAL_TABLET | Freq: Four times a day (QID) | ORAL | 0 refills | Status: DC | PRN

## 2019-11-20 MED ORDER — CEFAZOLIN SODIUM-DEXTROSE 2-4 GM/100ML-% IV SOLN
INTRAVENOUS | Status: AC
  Filled 2019-11-20: qty 100

## 2019-11-20 MED ORDER — EPINEPHRINE PF 1 MG/ML IJ SOLN
INTRAMUSCULAR | Status: AC
  Filled 2019-11-20: qty 1

## 2019-11-20 MED ORDER — FENTANYL CITRATE (PF) 100 MCG/2ML IJ SOLN
INTRAMUSCULAR | Status: AC
  Filled 2019-11-20: qty 2

## 2019-11-20 MED ORDER — PROPOFOL 10 MG/ML IV BOLUS
INTRAVENOUS | Status: DC | PRN
  Administered 2019-11-20: 200 mg via INTRAVENOUS

## 2019-11-20 MED ORDER — BUPIVACAINE HCL (PF) 0.5 % IJ SOLN
INTRAMUSCULAR | Status: AC
  Filled 2019-11-20: qty 30

## 2019-11-20 MED ORDER — FENTANYL CITRATE (PF) 100 MCG/2ML IJ SOLN
INTRAMUSCULAR | Status: AC
  Administered 2019-11-20: 50 ug via INTRAVENOUS
  Filled 2019-11-20: qty 2

## 2019-11-20 MED ORDER — LIDOCAINE HCL (PF) 2 % IJ SOLN
INTRAMUSCULAR | Status: AC
  Filled 2019-11-20: qty 10

## 2019-11-20 MED ORDER — GLYCOPYRROLATE 0.2 MG/ML IJ SOLN
INTRAMUSCULAR | Status: DC | PRN
  Administered 2019-11-20: .2 mg via INTRAVENOUS

## 2019-11-20 MED ORDER — DEXAMETHASONE SODIUM PHOSPHATE 10 MG/ML IJ SOLN
INTRAMUSCULAR | Status: AC
  Filled 2019-11-20: qty 1

## 2019-11-20 MED ORDER — PHENYLEPHRINE HCL (PRESSORS) 10 MG/ML IV SOLN
INTRAVENOUS | Status: DC | PRN
  Administered 2019-11-20: 200 ug via INTRAVENOUS
  Administered 2019-11-20 (×3): 100 ug via INTRAVENOUS
  Administered 2019-11-20: 200 ug via INTRAVENOUS
  Administered 2019-11-20: 100 ug via INTRAVENOUS

## 2019-11-20 MED ORDER — FENTANYL CITRATE (PF) 100 MCG/2ML IJ SOLN: 25.0000 ug | INTRAMUSCULAR | Status: DC | PRN

## 2019-11-20 MED ORDER — SODIUM CHLORIDE 0.9 % IV SOLN: INTRAVENOUS | Status: DC

## 2019-11-20 MED ORDER — MIDAZOLAM HCL 2 MG/2ML IJ SOLN
INTRAMUSCULAR | Status: AC
  Filled 2019-11-20: qty 2

## 2019-11-20 MED ORDER — LIDOCAINE HCL (CARDIAC) PF 100 MG/5ML IV SOSY
PREFILLED_SYRINGE | INTRAVENOUS | Status: DC | PRN
  Administered 2019-11-20: 100 mg via INTRAVENOUS

## 2019-11-20 MED ORDER — ONDANSETRON HCL 4 MG/2ML IJ SOLN
INTRAMUSCULAR | Status: AC
  Filled 2019-11-20: qty 2

## 2019-11-20 SURGICAL SUPPLY — 41 items
BLADE SURG 15 STRL LF DISP TIS (BLADE) ×1 IMPLANT
BLADE SURG 15 STRL SS (BLADE) ×1
CANISTER SUCT 1200ML W/VALVE (MISCELLANEOUS) ×2 IMPLANT
CHLORAPREP W/TINT 26 (MISCELLANEOUS) ×2 IMPLANT
CNTNR SPEC 2.5X3XGRAD LEK (MISCELLANEOUS)
CONT SPEC 4OZ STER OR WHT (MISCELLANEOUS)
CONTAINER SPEC 2.5X3XGRAD LEK (MISCELLANEOUS) IMPLANT
COVER WAND RF STERILE (DRAPES) ×2 IMPLANT
DERMABOND ADVANCED (GAUZE/BANDAGES/DRESSINGS) ×1
DERMABOND ADVANCED .7 DNX12 (GAUZE/BANDAGES/DRESSINGS) ×1 IMPLANT
DEVICE DUBIN SPECIMEN MAMMOGRA (MISCELLANEOUS) ×2 IMPLANT
DRAPE LAPAROTOMY TRNSV 106X77 (MISCELLANEOUS) ×2 IMPLANT
ELECT CAUTERY BLADE TIP 2.5 (TIP) ×2
ELECT REM PT RETURN 9FT ADLT (ELECTROSURGICAL) ×2
ELECTRODE CAUTERY BLDE TIP 2.5 (TIP) ×1 IMPLANT
ELECTRODE REM PT RTRN 9FT ADLT (ELECTROSURGICAL) ×1 IMPLANT
GAUZE 4X4 16PLY RFD (DISPOSABLE) ×2 IMPLANT
GLOVE BIO SURGEON STRL SZ 6.5 (GLOVE) ×2 IMPLANT
GLOVE BIOGEL PI IND STRL 6.5 (GLOVE) ×1 IMPLANT
GLOVE BIOGEL PI INDICATOR 6.5 (GLOVE) ×1
GOWN STRL REUS W/ TWL LRG LVL3 (GOWN DISPOSABLE) ×3 IMPLANT
GOWN STRL REUS W/TWL LRG LVL3 (GOWN DISPOSABLE) ×3
KIT MARKER MARGIN INK (KITS) ×2 IMPLANT
KIT TURNOVER KIT A (KITS) ×2 IMPLANT
LABEL OR SOLS (LABEL) ×2 IMPLANT
MARGIN MAP 10MM (MISCELLANEOUS) ×2 IMPLANT
NEEDLE HYPO 25X1 1.5 SAFETY (NEEDLE) ×2 IMPLANT
PACK BASIN MINOR ARMC (MISCELLANEOUS) ×2 IMPLANT
RETRACTOR RING XSMALL (MISCELLANEOUS) ×1 IMPLANT
RTRCTR WOUND ALEXIS 13CM XS SH (MISCELLANEOUS) ×2
SUT ETHILON 3-0 FS-10 30 BLK (SUTURE) ×2
SUT MNCRL 4-0 (SUTURE) ×2
SUT MNCRL 4-0 27XMFL (SUTURE) ×2
SUT SILK 2 0 SH (SUTURE) ×2 IMPLANT
SUT VIC AB 3-0 SH 27 (SUTURE) ×2
SUT VIC AB 3-0 SH 27X BRD (SUTURE) ×2 IMPLANT
SUTURE EHLN 3-0 FS-10 30 BLK (SUTURE) ×1 IMPLANT
SUTURE MNCRL 4-0 27XMF (SUTURE) ×2 IMPLANT
SYR 10ML LL (SYRINGE) ×2 IMPLANT
SYR BULB IRRIG 60ML STRL (SYRINGE) ×2 IMPLANT
WATER STERILE IRR 1000ML POUR (IV SOLUTION) ×2 IMPLANT

## 2019-11-20 NOTE — Interval H&P Note (Signed)
History and Physical Interval Note:  11/20/2019 9:06 AM  Shelly Huynh  has presented today for surgery, with the diagnosis of N64.89 Radial scar of breast.  The various methods of treatment have been discussed with the patient and family. After consideration of risks, benefits and other options for treatment, the patient has consented to  Procedure(s): BREAST BIOPSY WITH NEEDLE LOCALIZATION x 2 (Right) as a surgical intervention.  The patient's history has been reviewed, patient examined, no change in status, stable for surgery.  I have reviewed the patient's chart and labs.  Right breast marked in the pre procedure room. Questions were answered to the patient's satisfaction.     Herbert Pun

## 2019-11-20 NOTE — Transfer of Care (Signed)
Immediate Anesthesia Transfer of Care Note  Patient: Shelly Huynh  Procedure(s) Performed: BREAST BIOPSY WITH NEEDLE LOCALIZATION x 2 (Right Breast)  Patient Location: PACU  Anesthesia Type:General  Level of Consciousness: sedated  Airway & Oxygen Therapy: Patient Spontanous Breathing and Patient connected to face mask oxygen  Post-op Assessment: Report given to RN and Post -op Vital signs reviewed and stable  Post vital signs: Reviewed and stable  Last Vitals:  Vitals Value Taken Time  BP 102/60 11/20/19 1129  Temp 36.2 C 11/20/19 1129  Pulse 69 11/20/19 1130  Resp 22 11/20/19 1130  SpO2 99 % 11/20/19 1130  Vitals shown include unvalidated device data.  Last Pain:  Vitals:   11/20/19 1129  TempSrc: Temporal  PainSc: 0-No pain         Complications: No apparent anesthesia complications

## 2019-11-20 NOTE — Op Note (Signed)
Preoperative diagnosis: Right breast radial scar.  Postoperative diagnosis: Right breast radial scar.  Procedure: Right breast needle guided excisional biopsy.  Anesthesia: GETA  Surgeon: Dr. Windell Moment  Wound Classification: Clean  Indications:  Patient is a 48 y.o. female with a nonpalpable right breast mass underwent workup with ultrasound, diagnostic mammogram and Ultrasound guided biopsy. These showed radial scar features. Excisional biopsy was indicated to rule out malignancy.   Findings: 1.  No palpable mass 2.  Very fibrotic breast tissue 3.  Both markers identified on specimen mammogram  Description of procedure: The patient was taken to the operating room and placed supine on the operating table, and after general anesthesia was administered, the right chest was prepped and draped in the usual sterile fashion.  Preop localization studies were reviewed and markers identified.  A time-out was completed verifying correct patient, procedure, site, positioning, and implant(s) and/or special equipment prior to beginning this procedure.  A circumareolar skin incision incision was planned in between the 2 wires placed preop. Local anesthesia was infiltrated and a skin incision was made. Flaps were raised.  A 2-0 silk figure-of-eight stay suture was placed used to retract.  The mass was dissected from surrounding tissues using electrocautery. After removing tissue with wires, the cavity was palpated and no additional abnormalities was palpated. The specimen was oriented and submitted to pathology.  Specimen mammogram shows 2 markers at the center of the specimen.  Pathology called by reporting that the 2 biopsy cavities were in the specimen and at least 5 mm from the margins. Hemostasis was achieved with electrocautery and suture ligatures of 3-0 Vicryl. The subcutaneous tissue immediately under the skin was approximated with interrupted 3-0 Vicryl sutures.  No attempt was made to close the  dead space. The skin was closed with a subcuticular suture of running monocryl 4-0.  Dermabond was applied.  The patient tolerated the procedure well and was taken to the postanesthesia care unit in stable condition.   Specimen: Right breast excisional biopsy  Complications: None  Estimated Blood Loss: 5 mL

## 2019-11-20 NOTE — Anesthesia Preprocedure Evaluation (Signed)
Anesthesia Evaluation  Patient identified by MRN, date of birth, ID band Patient awake    Reviewed: Allergy & Precautions, H&P , NPO status , Patient's Chart, lab work & pertinent test results  History of Anesthesia Complications Negative for: history of anesthetic complications  Airway Mallampati: II  TM Distance: >3 FB Neck ROM: full    Dental  (+) Chipped, Poor Dentition   Pulmonary sleep apnea and Continuous Positive Airway Pressure Ventilation , former smoker,           Cardiovascular Exercise Tolerance: Good hypertension, (-) angina+ CAD and +CHF  (-) DOE + Valvular Problems/Murmurs      Neuro/Psych PSYCHIATRIC DISORDERS  Neuromuscular disease    GI/Hepatic Neg liver ROS, GERD  Medicated and Controlled,  Endo/Other  diabetes, Type 2  Renal/GU      Musculoskeletal  (+) Arthritis ,   Abdominal   Peds  Hematology negative hematology ROS (+)   Anesthesia Other Findings Past Medical History: No date: Acid reflux No date: Anxiety No date: Arthritis No date: CHF (congestive heart failure) (HCC) No date: Depression No date: Diabetes mellitus without complication (HCC) No date: Heart valve replaced No date: High cholesterol No date: High cholesterol No date: Hypertension No date: Hypokalemia No date: IBS (irritable bowel syndrome) No date: Sciatic pain  Past Surgical History: No date: ABDOMINAL HYSTERECTOMY 07/25/2017: BREAST BIOPSY; Left     Comment:  BENIGN BREAST TISSUE WITH MICROCALCIFICATIONS 10/22/2019: BREAST BIOPSY; Right     Comment:  Affirm Bx- X-clip- path pending 10/22/2019: BREAST BIOPSY; Right     Comment:  Affirm Bx- Ribbon clip- path pending 1992: BREAST EXCISIONAL BIOPSY; Right     Comment:  NEG 11/20/2019: BREAST LUMPECTOMY; Right     Comment:  complex sclerosing lesion x 2 No date: CARDIAC VALVE SURGERY No date: CESAREAN SECTION 10/07/2016: ESOPHAGOGASTRODUODENOSCOPY (EGD) WITH  PROPOFOL; N/A     Comment:  Procedure: ESOPHAGOGASTRODUODENOSCOPY (EGD) WITH               PROPOFOL;  Surgeon: Jonathon Bellows, MD;  Location: [facility]               ENDOSCOPY;  Service: Endoscopy;  Laterality: N/A; No date: HERNIA REPAIR     Reproductive/Obstetrics negative OB ROS                             Anesthesia Physical Anesthesia Plan  ASA: III  Anesthesia Plan: General LMA   Post-op Pain Management:    Induction: Intravenous  PONV Risk Score and Plan: Dexamethasone, Ondansetron, Midazolam and Treatment may vary due to age or medical condition  Airway Management Planned: LMA  Additional Equipment:   Intra-op Plan:   Post-operative Plan: Extubation in OR  Informed Consent: I have reviewed the patients History and Physical, chart, labs and discussed the procedure including the risks, benefits and alternatives for the proposed anesthesia with the patient or authorized representative who has indicated his/her understanding and acceptance.     Dental Advisory Given  Plan Discussed with: Anesthesiologist, CRNA and Surgeon  Anesthesia Plan Comments: (Patient consented for risks of anesthesia including but not limited to:  - adverse reactions to medications - damage to teeth, lips or other oral mucosa - sore throat or hoarseness - Damage to heart, brain, lungs or loss of life  Patient voiced understanding.)        Anesthesia Quick Evaluation

## 2019-11-20 NOTE — Anesthesia Procedure Notes (Signed)
Procedure Name: LMA Insertion Date/Time: 11/20/2019 9:47 AM Performed by: Rudean Hitt, CRNA Pre-anesthesia Checklist: Patient identified, Patient being monitored, Timeout performed, Emergency Drugs available and Suction available Patient Re-evaluated:Patient Re-evaluated prior to induction Oxygen Delivery Method: Circle system utilized Preoxygenation: Pre-oxygenation with 100% oxygen Induction Type: IV induction Ventilation: Mask ventilation without difficulty LMA: LMA inserted LMA Size: 4.5 Tube type: Oral Number of attempts: 1 Placement Confirmation: positive ETCO2 and breath sounds checked- equal and bilateral Tube secured with: Tape Dental Injury: Teeth and Oropharynx as per pre-operative assessment

## 2019-11-20 NOTE — Anesthesia Post-op Follow-up Note (Signed)
Anesthesia QCDR form completed.        

## 2019-11-20 NOTE — Anesthesia Postprocedure Evaluation (Signed)
Anesthesia Post Note  Patient: Shelly Huynh  Procedure(s) Performed: BREAST BIOPSY WITH NEEDLE LOCALIZATION x 2 (Right Breast)  Patient location during evaluation: PACU Anesthesia Type: General Level of consciousness: awake and alert Pain management: pain level controlled Vital Signs Assessment: post-procedure vital signs reviewed and stable Respiratory status: spontaneous breathing, nonlabored ventilation, respiratory function stable and patient connected to nasal cannula oxygen Cardiovascular status: blood pressure returned to baseline and stable Postop Assessment: no apparent nausea or vomiting Anesthetic complications: no     Last Vitals:  Vitals:   11/20/19 1200 11/20/19 1215  BP: 99/78 111/74  Pulse: 84 77  Resp: 19 19  Temp:    SpO2: 97% 99%    Last Pain:  Vitals:   11/20/19 1215  TempSrc:   PainSc: 2                  Precious Haws Piscitello

## 2019-11-20 NOTE — Discharge Instructions (Signed)
  Diet: Resume home heart healthy regular diet.   Activity: Increase activity as tolerated. Light activity and walking are encouraged. Do not drive or drink alcohol if taking narcotic pain medications.  Wound care: May shower with soapy water and pat dry (do not rub incisions), but no baths or submerging incision underwater until follow-up. (no swimming)   Medications: Resume all home medications. Take tramadol for pain as needed  May restart coumadin as recommended by Cardiologist.  May resume Coumadin per Dr. Windell Moment.  Call office [phone]) at any time if any questions, worsening pain, fevers/chills, bleeding, drainage from incision site, or other concerns.   AMBULATORY SURGERY  DISCHARGE INSTRUCTIONS   1) The drugs that you were given will stay in your system until tomorrow so for the next 24 hours you should not:  A) Drive an automobile B) Make any legal decisions C) Drink any alcoholic beverage   2) You may resume regular meals tomorrow.  Today it is better to start with liquids and gradually work up to solid foods.  You may eat anything you prefer, but it is better to start with liquids, then soup and crackers, and gradually work up to solid foods.   3) Please notify your doctor immediately if you have any unusual bleeding, trouble breathing, redness and pain at the surgery site, drainage, fever, or pain not relieved by medication.    4) Additional Instructions:        Please contact your physician with any problems or Same Day Surgery at [phone], Monday through Friday 6 am to 4 pm, or [place] [facility] number at [phone].

## 2019-11-25 LAB — SURGICAL PATHOLOGY

## 2019-12-11 ENCOUNTER — Encounter: Payer: Self-pay | Admitting: Pain Medicine

## 2019-12-12 NOTE — Progress Notes (Signed)
Patient: Shelly Huynh  Service Category: E/M  Provider: Gaspar Cola, MD  DOB: 1971-07-11  DOS: 12/16/2019  Location: Office  MRN: [id number]  Setting: Ambulatory outpatient  Referring Provider: Tracie Harrier, MD  Type: New Patient  Specialty: Interventional Pain Management  PCP: Tracie Harrier, MD  Location: Remote location  Delivery: TeleHealth     Virtual Encounter - Pain Management PROVIDER NOTE: Information contained herein reflects review and annotations entered in association with encounter. Interpretation of such information and data should be left to medically-trained personnel. Information provided to patient can be located elsewhere in the medical record under "Patient Instructions". Document created using STT-dictation technology, any transcriptional errors that may result from process are unintentional.    Contact & Pharmacy Preferred: [phone] Home: [phone] (home) Mobile: [phone] (mobile) E-mail: [email or web address] [place], [place] [address] [place] [address] Phone: [phone] Fax: [phone]   Pre-screening note:  Our staff contacted Ms. Tavella and offered her an "in person", "face-to-face" appointment versus a telephone encounter. She indicated preferring the telephone encounter, at this time.  Primary Reason(s) for Visit: Tele-Encounter for initial evaluation of one or more chronic problems (new to examiner) potentially causing chronic pain, and posing a threat to normal musculoskeletal function. (Level of risk: High) CC: Back Pain, Shoulder Pain, Hip Pain, and Leg Pain  I contacted Shelly Huynh on 12/16/2019 via telephone.      I clearly identified myself as Gaspar Cola, MD. I verified that I was speaking with the correct person using two identifiers (Name: DENETTE HASS, and date of birth: 22-Jul-1971).  Advanced Informed  Consent I sought verbal advanced consent from Shelly Huynh for virtual visit interactions. I informed Ms. Howes of possible security and privacy concerns, risks, and limitations associated with providing "not-in-person" medical evaluation and management services. I also informed Ms. Chick of the availability of "in-person" appointments. Finally, I informed her that there would be a charge for the virtual visit and that she could be  personally, fully or partially, financially responsible for it. Ms. Meinhardt expressed understanding and agreed to proceed.   HPI  Ms. Tlatelpa is a 49 y.o. year old, female patient, contacted today for an initial evaluation of her chronic pain. 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; Compulsive tobacco user syndrome; Bilateral leg edema; Chronic anticoagulation (Coumadin); Chronic hypokalemia; Coronary artery disease due to calcified coronary lesion; Diabetes mellitus type 2, diet-controlled ([place]); Enlarged heart; Gastroesophageal reflux disease without esophagitis; Osteoarthritis of spine with radiculopathy, lumbar region; Palpitations; Spondylosis of lumbar region without myelopathy or radiculopathy; Tobacco abuse; Vitamin D deficiency; Pleural effusion; HCAP (healthcare-associated pneumonia); On anticoagulant therapy; Positive Helicobacter pylori serology; Anxiety state; Asthma; Bilateral hand numbness; Cervical radiculopathy; Chest pain on breathing; Diabetes mellitus ([place]);  Cirrhosis of liver without ascites ([place]); Elevated hemoglobin A1c; GERD with esophagitis; Hyperaldosteronism ([place]); Hypertension; Hypertension due to endocrine disorder; Hypertension, well controlled; Irritable bowel syndrome with constipation; Lumbar radicular pain; Mitral valve disease, rheumatic; Cervicalgia; Presence of prosthetic heart valve; SOBOE (  shortness of breath on exertion); Status post bariatric surgery; Tricuspid valve disorder; 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[place]); BMI 40.0-44.9, adult ([place]); Hypercholesterolemia; Nicotine dependence, uncomplicated; H/O mechanical aortic valve replacement; Obstructive sleep apnea syndrome; Chronic low back pain (Bilateral) (L>R) w/o sciatica; Chronic lower extremity pain (Bilateral) (L>R); Chronic hip pain (Bilateral) (L>R); Chronic knee pain (Bilateral) (L>R); Chronic recurrent occipital headache (Bilateral); Cervicogenic headache (Bilateral); Chronic neck pain (Bilateral) (L>R); Chronic shoulder pain (Bilateral) (L>R); Chronic upper extremity pain (Bilateral) (R>L); Chronic pain syndrome; Pharmacologic therapy; Disorder of skeletal system; Problems influencing health status; Abnormal MRI, cervical spine (09/06/2019); Cervical central spinal stenosis; Cervical foraminal stenosis (C2-3, C4-5) (Left); Abnormal MRI, lumbar spine (08/24/2015); Lumbar facet hypertrophy (L4-5 and L5-S1) (Bilateral); and Lumbar facet syndrome (Bilateral) (L>R) on their problem list.   Onset and Duration: Gradual and Present longer than 3 months Cause of pain: Unknown Severity: Getting worse, NAS-11 at its worse: 10/10, NAS-11 at its best: 5/10, NAS-11 now: 7/10 and NAS-11 on the average: 5/10 Timing: Not influenced by the time of the day Aggravating Factors: Bending, Prolonged sitting and Squatting Alleviating Factors: Stretching, Hot packs and Lying down Associated Problems: Fatigue, Weakness, Pain that wakes patient up and Pain that does not allow patient to sleep Quality of Pain:  Aching, Constant, Heavy, Pressure-like and Tingling Previous Examinations or Tests: X-rays, Neurological evaluation and Orthopedic evaluation Previous Treatments: Narcotic medications and Physical Therapy  According to the patient her primary area of pain is that of the lower back, bilaterally (Bilateral), with the primary pain being in the midline, but the left side is usually worse than the right (L>R).  She denies any prior back surgeries, physical therapy, or recent x-rays, MRIs, or CT scans of the lumbar spine.  She does admit having had to epidurals for cesarean section, but none for her chronic pain.  The patient secondary area pain is that of the lower extremities, bilaterally (Bilateral) with the left being worse than the right (L>R).  She denies any prior surgeries, nerve blocks, physical therapy, or recent x-rays of the lower extremities, including no ultrasounds, MRIs, or CT scans.  In the case of the left lower extremity pain this goes down to the area of the knee through the back and the front of the leg with numbness in the area of her anterior thigh, usually with prolonged sitting or standing.  In the case of the right lower extremity pain this goes all the way down to the ankle running through the back of the leg with occasional numbness into the foot and the lateral aspect of the foot following an S1 dermatomal distribution.  The lower extremity pain also involves the hips, bilaterally (Bilateral), with the left being worse than the right (L>R).  She denies any hip surgery, joint injection, physical therapy, or recent x-rays.  In addition, it also involves pain going down to both knees (Bilateral), with the left being worse than the right (L>R).  Also the patient denies any knee surgery, knee joint injections, physical therapy, but does admit to an MRI of the right knee.  No imaging of the left knee.  The patient's third area pain is that of the neck, and the posterior aspect of the neck,  bilaterally (Bilateral), with the left being worse than the right (L>R).  She denies any prior  Cervical Surgery, no nerve blocks, but she has been having some physical therapy which started in November for her neck and shoulder pain.  This does provide the patient with some temporary relief of  the pain.  The patient also has an MRI of the cervical spine that was recently done.  This pain also seems to be associated with occipital headaches, bilaterally (Bilateral).  The fourth area of pain is that of the upper extremities and shoulders with the pain affecting both shoulders (Bilateral), with the left being worse than the right (L>R).  The patient denies any shoulder surgery, nerve blocks, and she does admit to physical therapy which she is currently undergoing for the neck and shoulder pain.  She denies any recent x-rays or imaging of the shoulders.  The patient's fifth area of pain is that of the upper extremities, bilaterally (Bilateral), with the right being worse than the left (R>L).  She denies any surgery, nerve blocks, physical therapy, or recent x-rays or imaging of the upper extremities.  She does complain of bilateral upper extremity numbness that is worse on the right compared to the left (R>L).  [name] also right-handed.  In the case of the right upper extremity pain and numbness this goes all the way into the hand affecting fingers 1, 2, and 3 (thumb, index finger, and middle finger), following a C6 and C7 dermatomal distribution.  In the case of the left upper extremity the pain and numbness goes all the way down to fingers 3, 4, and 5.  (Middle finger, ring finger, and pinky finger.)  This seems to follow a C7 and C8 dermatomal distribution.  Historic Controlled Substance Pharmacotherapy Review  Current opioid analgesics:  Tramadol 50 mg 1 tablet p.o. every 6 hours Highest recorded MME/day: 30 mg/day MME/day: 20 mg/day   Historical Monitoring: The patient  reports no history of drug  use. List of all UDS Test(s): No results found. List of other Serum/Urine Drug Screening Test(s):  No results found. Historical Background Evaluation: [place] PMP: PDMP reviewed during this encounter. Two (2) year initial data search conducted.             PMP NARX Score Report:  Narcotic: 270 Sedative: 150 Stimulant: 000 Risk Assessment Profile: PMP NARX Overdose Risk Score: 270  Pharmacologic Plan: As per protocol, I have not taken over any controlled substance management, pending the results of ordered tests and/or consults.            Initial impression: Pending review of available data and ordered tests.  Meds   Current Outpatient Medications:  .  albuterol (PROAIR HFA) 108 (90 BASE) MCG/ACT inhaler, Inhale 2 puffs into the lungs every 4 (four) hours as needed for wheezing or shortness of breath. , Disp: , Rfl:  .  amoxicillin (AMOXIL) 500 MG capsule, Take 2,000 mg by mouth as needed (for dental procedure). , Disp: , Rfl:  .  aspirin EC 81 MG tablet, Take 81 mg daily by mouth., Disp: , Rfl:  .  Biotin 5000 MCG TABS, Take 1 tablet daily by mouth., Disp: , Rfl:  .  candesartan (ATACAND) 8 MG tablet, Take 8 mg by mouth daily., Disp: , Rfl:  .  carvedilol (COREG) 3.125 MG tablet, Take 3.125 mg 2 (two) times daily by mouth. , Disp: , Rfl:  .  cyclobenzaprine (FLEXERIL) 10 MG tablet, Take 10 mg by mouth 3 (three) times daily as needed for muscle spasms. , Disp: , Rfl:  .  diazepam (VALIUM) 5 MG tablet, Take 5 mg by mouth every 8 (eight) hours as needed for anxiety or muscle spasms. , Disp: , Rfl:  .  docusate sodium (COLACE) 100 MG capsule, Take 100 mg  by mouth daily as needed for mild constipation., Disp: , Rfl:  .  DULoxetine (CYMBALTA) 30 MG capsule, Take 30 mg by mouth at bedtime., Disp: , Rfl:  .  ergocalciferol (VITAMIN D2) 1.25 MG (50000 UT) capsule, Take 50,000 Units by mouth once a week., Disp: , Rfl:  .  fluticasone (FLONASE) 50 MCG/ACT nasal spray, Place 2 sprays into both  nostrils daily as needed for allergies or rhinitis., Disp: , Rfl:  .  gabapentin (NEURONTIN) 300 MG capsule, Take 300 mg by mouth at bedtime., Disp: , Rfl:  .  hydrocortisone 2.5 % cream, Apply 1 application topically 2 (two) times a week., Disp: , Rfl:  .  iron polysaccharides (FERREX 150) 150 MG capsule, Take 150 mg by mouth daily., Disp: , Rfl:  .  levalbuterol (XOPENEX) 1.25 MG/0.5ML nebulizer solution, Take 1.25 mg by nebulization every 4 (four) hours as needed for wheezing or shortness of breath., Disp: , Rfl:  .  levocetirizine (XYZAL) 5 MG tablet, Take 5 mg by mouth every evening. , Disp: , Rfl:  .  Linaclotide (LINZESS) 290 MCG CAPS capsule, Take 290 mcg by mouth daily as needed (constipation). , Disp: , Rfl:  .  meclizine (ANTIVERT) 25 MG tablet, Take 25 mg by mouth 3 (three) times daily as needed for dizziness. , Disp: , Rfl:  .  Minoxidil 5 % FOAM, Apply 1 mL topically daily. , Disp: , Rfl:  .  pantoprazole (PROTONIX) 40 MG tablet, Take 40 mg by mouth 2 (two) times daily. , Disp: , Rfl:  .  potassium chloride (KLOR-CON) 10 MEQ tablet, potassium chloride ER 10 mEq tablet,extended release, Disp: , Rfl:  .  pravastatin (PRAVACHOL) 20 MG tablet, Take 20 mg by mouth daily. , Disp: , Rfl:  .  spironolactone (ALDACTONE) 50 MG tablet, Take 75 mg by mouth daily., Disp: , Rfl:  .  warfarin (COUMADIN) 6 MG tablet, Take 6 mg by mouth See admin instructions. Mon and Tues, WED, Disp: , Rfl:  .  warfarin (COUMADIN) 7.5 MG tablet, Take 1 tablet (7.5 mg total) one time only at 6 PM by mouth. (Patient taking differently: Take 7.5 mg by mouth See admin instructions. Thurs- Sun), Disp: 30 tablet, Rfl: 0  ROS  Cardiovascular: High blood pressure, Weak heart (CHF) and Blood thinners:  Anticoagulant Pulmonary or Respiratory: No reported pulmonary signs or symptoms such as wheezing and difficulty taking a deep full breath (Asthma), difficulty blowing air out (Emphysema), coughing up mucus (Bronchitis),  persistent dry cough, or temporary stoppage of breathing during sleep Neurological: No reported neurological signs or symptoms such as seizures, abnormal skin sensations, urinary and/or fecal incontinence, being born with an abnormal open spine and/or a tethered spinal cord Psychological-Psychiatric: Anxiousness and Depressed Gastrointestinal: Reflux or heatburn and Alternating episodes iof diarrhea and constipation (IBS-Irritable bowe syndrome) Genitourinary: No reported renal or genitourinary signs or symptoms such as difficulty voiding or producing urine, peeing blood, non-functioning kidney, kidney stones, difficulty emptying the bladder, difficulty controlling the flow of urine, or chronic kidney disease Hematological: Weakness due to low blood hemoglobin or red blood cell count (Anemia) Endocrine: High blood sugar controlled without the use of insulin (NIDDM) Rheumatologic: No reported rheumatological signs and symptoms such as fatigue, joint pain, tenderness, swelling, redness, heat, stiffness, decreased range of motion, with or without associated rash Musculoskeletal: Negative for myasthenia gravis, muscular dystrophy, multiple sclerosis or malignant hyperthermia Work History: Out of work due to pain  Allergies  Ms. Bergerson is allergic to ivp dye [  iodinated diagnostic agents]; sulfa antibiotics; banana; guaifenesin; hctz [hydrochlorothiazide]; losartan; povidone iodine; povidone-iodine; lisinopril; and mucinex [guaifenesin er].  Laboratory Chemistry Profile   Screening Lab Results  Component Value Date   [place] NEGATIVE 11/19/2019   MRSAPCR NEGATIVE 10/03/2017    Inflammation (CRP: Acute Phase) (ESR: Chronic Phase) Lab Results  Component Value Date   LATICACIDVEN 0.9 06/12/2017                         Rheumatology No results found.  Renal Lab Results  Component Value Date   BUN 10 10/04/2017   CREATININE 0.72 10/04/2017   GFRAA >60 10/04/2017   GFRNONAA >60  10/04/2017                             Hepatic Lab Results  Component Value Date   AST 34 06/12/2017   ALT 23 06/12/2017   ALBUMIN 4.1 06/12/2017   ALKPHOS 88 06/12/2017   LIPASE 33 06/14/2014                        Electrolytes Lab Results  Component Value Date   NA 136 10/04/2017   K 3.1 (L) 10/04/2017   CL 101 10/04/2017   CALCIUM 9.6 10/04/2017   MG 2.2 03/05/2015   PHOS 3.6 03/05/2015                        Neuropathy No results found.  CNS No results found.  Bone No results found.  Coagulation Lab Results  Component Value Date   INR 1.45 10/04/2017   LABPROT 17.5 (H) 10/04/2017   APTT 41 (H) 05/29/2015   PLT 424 10/04/2017                        Cardiovascular Lab Results  Component Value Date   BNP 215.0 (H) 03/05/2015   TROPONINI <0.03 10/02/2017   HGB 11.2 (L) 10/04/2017   HCT 34.8 (L) 10/04/2017                         ID Lab Results  Component Value Date   SARSCOV2NAA NEGATIVE 11/19/2019   MRSAPCR NEGATIVE 10/03/2017    Cancer No results found.  Endocrine Lab Results  Component Value Date   TSH 2.840 10/02/2017                        Note: Lab results reviewed.  Imaging Review  Cervical Imaging: Cervical MR wo contrast:  Results for orders placed during the hospital encounter of 09/06/19  MR CERVICAL SPINE WO CONTRAST   Narrative CLINICAL DATA:  Bilateral shoulder pain radiating down the arms for 2 months.  EXAM: MRI CERVICAL SPINE WITHOUT CONTRAST  TECHNIQUE: Multiplanar, multisequence MR imaging of the cervical spine was performed. No intravenous contrast was administered.  COMPARISON:  None.  FINDINGS: Alignment: Loss of the normal cervical lordosis, which can be associated with muscle spasm. No subluxation.  Vertebrae: Mild disc desiccation throughout the cervical spine. 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  Cord: No significant abnormal spinal cord signal is observed.  Posterior Fossa,  vertebral arteries, paraspinal tissues: Unremarkable  Disc levels:  C2-3: Mild left foraminal stenosis due to uncinate spurring.  C3-4: Borderline central narrowing of the thecal sac due to short pedicles. Bilateral uncinate  spurring.  C4-5: Borderline central narrowing of the thecal sac and borderline left foraminal stenosis due to short pedicles and uncinate spurring.  C5-6: Moderate left eccentric central narrowing of the thecal sac due to left paracentral disc protrusion, image 17/12.  C6-7: Borderline central narrowing of the thecal sac due to short pedicles.  C7-T1: Unremarkable.  IMPRESSION: 1. Moderate left eccentric central narrowing of the thecal sac at C5-6 due to a left paracentral disc protrusion. 2. Mild left foraminal stenosis at C2-3 due to uncinate spurring. 3. 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 4. Loss of the normal cervical lordosis, which can be associated with muscle spasm.   Electronically Signed   By: Van Clines M.D.   On: 09/06/2019 14:29    Lumbosacral Imaging: Lumbar MR wo contrast:  Results for orders placed during the hospital encounter of 08/23/15  MR Lumbar Spine Wo Contrast   Narrative CLINICAL DATA:  49 year old female with lower back and bilateral lower extremity weakness. Pain extends from buttocks inferiorly. Intermittent numbness of the feet. 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 Initial encounter.  EXAM: MRI LUMBAR SPINE WITHOUT CONTRAST  TECHNIQUE: Multiplanar, multisequence MR imaging of the lumbar spine was performed. No intravenous contrast was administered.  COMPARISON:  06/22/2013 MR.  FINDINGS: Last fully open disk space is labeled L5-S1. Present examination incorporates from T10-11 disc space through lower sacrum.  Conus slightly low lying at the L2-3 level. Linear increased signal within the distal cord/conus felt to be related to artifact rather than true  abnormality.  3.8 cm fluid appearing structure left aspect of the pelvis suggestive of ovarian cyst incompletely imaged/ evaluated on present exam. Atherosclerotic type changes with slight ectasia left common iliac artery.  Mild curvature of the lumbar spine.  T10-11 through L3-4 unremarkable.  L4-5: Facet bony overgrowth. No significant spinal stenosis or foraminal narrowing.  L5-S1: Prominent facet joint degenerative changes with bony overgrowth. Ligamentum flavum hypertrophy. Mild bulge and foraminal spur. Bilateral foraminal narrowing with slight encroachment upon but not significant compression of the exiting L5 nerve roots. No significant spinal stenosis.  IMPRESSION: L5-S1 multifactorial bilateral foraminal narrowing with slight encroachment upon but not significant compression of the exiting L5 nerve roots. Minimal change since the prior MR.  3.8 cm fluid appearing structure left aspect of the pelvis suggestive of ovarian cyst incompletely imaged/ evaluated on present exam. Atherosclerotic type changes with slight ectasia left common iliac artery (measuring up to 1.3 cm).   Electronically Signed   By: Genia Del M.D.   On: 08/24/2015 06:49    Lumbar DG (Complete) 4+V:  Results for orders placed during the hospital encounter of 06/28/12  DG Lumbar Spine Complete   Narrative *RADIOLOGY REPORT*  Clinical Data: Low back pain  LUMBAR SPINE - COMPLETE 4+ VIEW  Comparison: None.  Findings: Five non-rib bearing lumbar type vertebral bodies are identified.  Normal alignment.  No compression deformity.  Normal visualized bowel gas pattern.  IMPRESSION: No acute osseous abnormality of the lumbar spine.  Original Report Authenticated By: Arline Asp, M.D.         Knee Imaging: Knee-R MR wo contrast:  Results for orders placed during the hospital encounter of 04/11/19  MR KNEE RIGHT WO CONTRAST   Narrative CLINICAL DATA:  Acute right knee pain after  standing up from her desk at work and feeling a pulling sensation posteriorly.  EXAM: MRI OF THE RIGHT KNEE WITHOUT CONTRAST  TECHNIQUE: Multiplanar, multisequence MR imaging of the  knee was performed. No intravenous contrast was administered.  COMPARISON:  Right knee x-rays dated June 28, 2012.  FINDINGS: MENISCI  Medial meniscus:  Intact.  Lateral meniscus:  Intact.  LIGAMENTS  Cruciates:  Intact ACL and PCL.  Collaterals: Medial collateral ligament is intact. Lateral collateral ligament complex is intact.  CARTILAGE  Patellofemoral: Early patellar cartilage degenerative signal. No focal defect.  Medial:  No chondral defect.  Lateral:  No chondral defect.  Joint:  No joint effusion. Normal Hoffa's fat. No plical thickening.  Popliteal Fossa:  No Baker cyst. Intact popliteus tendon.  Extensor Mechanism: Intact quadriceps tendon and patellar tendon. Intact medial and lateral patellar retinaculum. Intact MPFL.  Bones: No focal marrow signal abnormality. No fracture or dislocation.  Other: Mild nonspecific edema in the popliteal fossa. No soft tissue mass or fluid collection.  IMPRESSION: 1. No meniscal or ligamentous injury.   Electronically Signed   By: Titus Dubin M.D.   On: 04/12/2019 08:34    Knee-R DG 4 views:  Results for orders placed during the hospital encounter of 06/28/12  DG Knee Complete 4 Views Right   Narrative *RADIOLOGY REPORT*  Clinical Data: Right knee pain  RIGHT KNEE - COMPLETE 4+ VIEW  Comparison: None.  Findings: Presumed bone island noted within the distal right femur. No fracture or dislocation.  No suprapatellar effusion.  IMPRESSION: No acute osseous abnormality.  Original Report Authenticated By: Arline Asp, M.D.   Complexity Note: Imaging results reviewed. Results shared with Ms. Bergeson, using Layman's terms.                        PFSH  Drug: Ms. Budzik  reports no history of drug use. Alcohol:   reports no history of alcohol use. Tobacco:  reports that she quit smoking about 4 years ago. She has never used smokeless tobacco. Medical:  has a past medical history of Acid reflux, Anxiety, Arthritis, CHF (congestive heart failure) ([place]), Depression, Diabetes mellitus without complication ([place]), Heart valve replaced, High cholesterol, High cholesterol, Hypertension, Hypokalemia, IBS (irritable bowel syndrome), and Sciatic pain. Family: family history includes Breast cancer in her paternal grandmother.  Past Surgical History:  Procedure Laterality Date  . ABDOMINAL HYSTERECTOMY    . BREAST BIOPSY Left 07/25/2017   BENIGN BREAST TISSUE WITH MICROCALCIFICATIONS  . BREAST BIOPSY Right 10/22/2019   Affirm Bx- X-clip- path pending  . BREAST BIOPSY Right 10/22/2019   Affirm Bx- Ribbon clip- path pending  . BREAST BIOPSY Right 11/20/2019   Procedure: BREAST BIOPSY WITH NEEDLE LOCALIZATION x 2;  Surgeon: Herbert Pun, MD;  Location: [facility] ORS;  Service: General;  Laterality: Right;  . BREAST EXCISIONAL BIOPSY Right 1992   NEG  . BREAST LUMPECTOMY Right 11/20/2019   complex sclerosing lesion x 2  . CARDIAC VALVE SURGERY    . CESAREAN SECTION    . ESOPHAGOGASTRODUODENOSCOPY (EGD) WITH PROPOFOL N/A 10/07/2016   Procedure: ESOPHAGOGASTRODUODENOSCOPY (EGD) WITH PROPOFOL;  Surgeon: Jonathon Bellows, MD;  Location: [facility] ENDOSCOPY;  Service: Endoscopy;  Laterality: N/A;  . HERNIA REPAIR     Active Ambulatory Problems    Diagnosis Date Noted  . Acute respiratory failure with hypoxemia ([place]) 03/05/2015  . Acute right heart failure ([place]) 04/04/2015  . Benign essential HTN 05/05/2015  . Acute on chronic combined systolic and diastolic congestive heart failure ([place]) 04/08/2015  . History of open heart surgery 04/03/2015  . H/O mitral valve replacement with mechanical valve 04/03/2015  . H/O tricuspid valve  replacement 04/03/2015  . Combined fat and carbohydrate induced hyperlipemia 09/11/2014  .  Adiposity 06/30/2015  . Obstructive apnea 10/28/2014  . Absolute anemia 04/26/2015  . H/O aortic valve replacement 04/07/2015  . H/O heart valve replacement with mechanical valve 04/07/2015  . H/O prosthetic heart valve 04/07/2015  . 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 Compulsive tobacco user syndrome 06/30/2015  . Bilateral leg edema 08/23/2016  . Chronic anticoagulation (Coumadin) 05/07/2015  . Chronic hypokalemia 06/15/2015  . Coronary artery disease due to calcified coronary lesion 03/04/2015  . Diabetes mellitus type 2, diet-controlled ([place]) 05/17/2016  . Enlarged heart 10/28/2014  . Gastroesophageal reflux disease without esophagitis 10/12/2015  . Osteoarthritis of spine with radiculopathy, lumbar region 08/08/2016  . Palpitations 05/18/2015  . Spondylosis of lumbar region without myelopathy or radiculopathy 10/12/2015  . Tobacco abuse 09/19/2016  . Vitamin D deficiency 05/17/2016  . Pleural effusion 10/02/2017  . HCAP (healthcare-associated pneumonia) 10/03/2017  . On anticoagulant therapy 09/07/2016  . Positive Helicobacter pylori serology 01/05/2017  . Anxiety state 04/02/2018  . Asthma 09/07/2016  . Bilateral hand numbness 10/22/2019  . Cervical radiculopathy 06/12/2018  . Chest pain on breathing 09/21/2017  . Diabetes mellitus ([place]) 09/07/2016  . Cirrhosis of liver without ascites ([place]) 10/28/2019  . Elevated hemoglobin A1c 10/28/2019  . GERD with esophagitis 09/07/2016  . Hyperaldosteronism ([place]) 06/24/2018  . Hypertension 09/07/2016  . Hypertension due to endocrine disorder 08/17/2018  . Hypertension, well controlled 08/14/2018  . Irritable bowel syndrome with constipation 12/26/2018  . Lumbar radicular pain 10/28/2019  . Mitral valve disease, 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  . Cervicalgia 10/22/2019  . Presence of prosthetic heart valve 04/03/2015  . SOBOE (shortness of breath on exertion) 04/24/2017  . Status post bariatric surgery 09/12/2018  . Tricuspid  valve disorder 09/21/2017  . 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 BMI 40.0-44.9, adult ([place]) 04/10/2019  . Hypercholesterolemia 09/07/2016  . Nicotine dependence, uncomplicated 69/62/9528  . H/O mechanical aortic valve replacement 12/16/2019  . Obstructive sleep apnea syndrome 09/07/2016  . Chronic low back pain (Bilateral) (L>R) w/o sciatica 12/16/2019  . Chronic lower extremity pain (Bilateral) (L>R) 12/16/2019  . Chronic hip pain (Bilateral) (L>R) 12/16/2019  . Chronic knee pain (Bilateral) (L>R) 12/16/2019  . Chronic recurrent occipital headache (Bilateral) 12/16/2019  . Cervicogenic headache (Bilateral) 12/16/2019  . Chronic neck pain (Bilateral) (L>R) 12/16/2019  . Chronic shoulder pain (Bilateral) (L>R) 12/16/2019  . Chronic upper extremity pain (Bilateral) (R>L) 12/16/2019  . Chronic pain syndrome 12/16/2019  . Pharmacologic therapy 12/16/2019  . Disorder of skeletal system 12/16/2019  . Problems influencing health status 12/16/2019  . Abnormal MRI, cervical spine (09/06/2019) 12/16/2019  . Cervical central spinal stenosis 12/16/2019  . Cervical foraminal stenosis (C2-3, C4-5) (Left) 12/16/2019  . Abnormal MRI, lumbar spine (08/24/2015) 12/16/2019  . Lumbar facet hypertrophy (L4-5 and L5-S1) (Bilateral) 12/16/2019  . Lumbar facet syndrome (Bilateral) (L>R) 12/16/2019   Resolved Ambulatory Problems    Diagnosis Date Noted  . No Resolved Ambulatory Problems   Past Medical History:  Diagnosis Date  . Acid reflux   . Anxiety   . Arthritis   . CHF (congestive heart failure) ([place])   . Depression   . Diabetes mellitus without complication ([place])   . Heart valve replaced   . High cholesterol   . High cholesterol   . Hypokalemia   . IBS (irritable bowel syndrome)   . Sciatic pain    Assessment  Primary Diagnosis & Pertinent Problem List: The primary encounter diagnosis  was Chronic pain syndrome. Diagnoses of Chronic low back pain (Bilateral) (L>R) w/o sciatica,  Lumbar facet hypertrophy (L4-5 and L5-S1) (Bilateral), Lumbar facet syndrome (Bilateral) (L>R), Chronic lower extremity pain (Bilateral) (L>R), Chronic hip pain (Bilateral) (L>R), Chronic knee pain (Bilateral) (L>R), Chronic recurrent occipital headache (Bilateral), Cervicogenic headache (Bilateral), Chronic neck pain (Bilateral) (L>R), Cervical central spinal stenosis, Cervical foraminal stenosis (C2-3, C4-5) (Left), Cervical radiculopathy, Chronic shoulder pain (Bilateral) (L>R), Chronic upper extremity pain (Bilateral) (R>L), Bilateral hand numbness, Abnormal MRI, cervical spine (09/06/2019), Abnormal MRI, lumbar spine (08/24/2015), Pharmacologic therapy, Disorder of skeletal system, Problems influencing health status, Chronic anticoagulation (Coumadin), and Vitamin D deficiency were also pertinent to this visit.  Visit Diagnosis (New problems to examiner): 1. Chronic pain syndrome   2. Chronic low back pain (Bilateral) (L>R) w/o sciatica   3. Lumbar facet hypertrophy (L4-5 and L5-S1) (Bilateral)   4. Lumbar facet syndrome (Bilateral) (L>R)   5. Chronic lower extremity pain (Bilateral) (L>R)   6. Chronic hip pain (Bilateral) (L>R)   7. Chronic knee pain (Bilateral) (L>R)   8. Chronic recurrent occipital headache (Bilateral)   9. Cervicogenic headache (Bilateral)   10. Chronic neck pain (Bilateral) (L>R)   11. Cervical central spinal stenosis   12. Cervical foraminal stenosis (C2-3, C4-5) (Left)   13. Cervical radiculopathy   14. Chronic shoulder pain (Bilateral) (L>R)   15. Chronic upper extremity pain (Bilateral) (R>L)   16. Bilateral hand numbness   17. Abnormal MRI, cervical spine (09/06/2019)   18. Abnormal MRI, lumbar spine (08/24/2015)   19. Pharmacologic therapy   20. Disorder of skeletal system   21. Problems influencing health status   22. Chronic anticoagulation (Coumadin)   23. Vitamin D deficiency    Plan of Care (Initial workup plan)  Note: Ms. Mcconahy was reminded that as per  protocol, today's visit has been an evaluation only. We have not taken over the patient's controlled substance management.  Problem-specific plan: No problem-specific Assessment & Plan notes found for this encounter.   Lab Orders     Compliance Drug Analysis, Ur     Comp. Metabolic Panel (12)     Magnesium     Vitamin B12     Sedimentation rate     25-Hydroxyvitamin D Lcms D2+D3     C-reactive protein  Imaging Orders     DG Cervical Spine With Flex & Extend     DG Shoulder Right     DG Shoulder Left     DG Lumbar Spine Complete W/Bend     DG HIP UNILAT W OR W/O PELVIS 2-3 VIEWS RIGHT     DG HIP UNILAT W OR W/O PELVIS 2-3 VIEWS LEFT     DG Knee 1-2 Views Right     DG Knee 1-2 Views Left Referral Orders  No referral(s) requested today   Procedure Orders    No procedure(s) ordered today   Pharmacotherapy (current): Medications ordered:  No orders of the defined types were placed in this encounter.    Pharmacological management options:  Opioid Analgesics: The patient was informed that there is no guarantee that she would be a candidate for opioid analgesics. The decision will be made following CDC guidelines. This decision will be based on the results of diagnostic studies, as well as Ms. Huq's risk profile.   Membrane stabilizer: To be determined at a later time  Muscle relaxant: To be determined at a later time  NSAID: To be determined at a later time  Other analgesic(s): To  be determined at a later time   Interventional management options: Ms. Samad was informed that there is no guarantee that she would be a candidate for interventional therapies. The decision will be based on the results of diagnostic studies, as well as Ms. Aime's risk profile.  Procedure(s) under consideration:  NOTE: COUMADIN ANTICOAGULATION (Stop: 5 days  Restart: 2 hours) Diagnostic bilateral lumbar facet block  Possible bilateral lumbar facet RFA  Diagnostic left L5-S1 LESI  Diagnostic  right-sided S1 TFESI  Diagnostic left-sided CESI  Diagnostic bilateral cervical facet block  Possible bilateral cervical facet RFA  Diagnostic bilateral intra-articular shoulder joint injection  Diagnostic bilateral suprascapular NB  Possible bilateral suprascapular nerve RFA  Diagnostic bilateral IA hip joint injection  Diagnostic bilateral IS knee joint injection  Possible bilateral IA Hyalgan knee injections  Diagnostic bilateral genicular NB  Possible bilateral genicular nerve RFA    Provider-requested follow-up: Return for (VV), (2V), (s/p Tests).  No future appointments. Total duration of encounter: 45 minutes.  Primary Care Physician: Tracie Harrier, MD Note by: Gaspar Cola, MD Date: 12/16/2019; Time: 6:40 PM

## 2019-12-16 ENCOUNTER — Other Ambulatory Visit: Payer: Self-pay

## 2019-12-16 ENCOUNTER — Ambulatory Visit: Payer: Managed Care, Other (non HMO) | Attending: Pain Medicine | Admitting: Pain Medicine

## 2019-12-16 VITALS — Ht 62.0 in | Wt 233.0 lb

## 2019-12-16 DIAGNOSIS — Z7901 Long term (current) use of anticoagulants: Secondary | ICD-10-CM

## 2019-12-16 DIAGNOSIS — M25552 Pain in left hip: Secondary | ICD-10-CM

## 2019-12-16 DIAGNOSIS — M5412 Radiculopathy, cervical region: Secondary | ICD-10-CM

## 2019-12-16 DIAGNOSIS — M79605 Pain in left leg: Secondary | ICD-10-CM

## 2019-12-16 DIAGNOSIS — M25561 Pain in right knee: Secondary | ICD-10-CM

## 2019-12-16 DIAGNOSIS — M545 Low back pain, unspecified: Secondary | ICD-10-CM | POA: Insufficient documentation

## 2019-12-16 DIAGNOSIS — G894 Chronic pain syndrome: Secondary | ICD-10-CM | POA: Diagnosis not present

## 2019-12-16 DIAGNOSIS — M25511 Pain in right shoulder: Secondary | ICD-10-CM

## 2019-12-16 DIAGNOSIS — E559 Vitamin D deficiency, unspecified: Secondary | ICD-10-CM

## 2019-12-16 DIAGNOSIS — M25562 Pain in left knee: Secondary | ICD-10-CM

## 2019-12-16 DIAGNOSIS — R519 Headache, unspecified: Secondary | ICD-10-CM | POA: Insufficient documentation

## 2019-12-16 DIAGNOSIS — M79604 Pain in right leg: Secondary | ICD-10-CM

## 2019-12-16 DIAGNOSIS — M542 Cervicalgia: Secondary | ICD-10-CM

## 2019-12-16 DIAGNOSIS — M47816 Spondylosis without myelopathy or radiculopathy, lumbar region: Secondary | ICD-10-CM | POA: Diagnosis not present

## 2019-12-16 DIAGNOSIS — M79601 Pain in right arm: Secondary | ICD-10-CM

## 2019-12-16 DIAGNOSIS — Z79899 Other long term (current) drug therapy: Secondary | ICD-10-CM | POA: Insufficient documentation

## 2019-12-16 DIAGNOSIS — G4486 Cervicogenic headache: Secondary | ICD-10-CM | POA: Insufficient documentation

## 2019-12-16 DIAGNOSIS — G8929 Other chronic pain: Secondary | ICD-10-CM | POA: Insufficient documentation

## 2019-12-16 DIAGNOSIS — M79602 Pain in left arm: Secondary | ICD-10-CM

## 2019-12-16 DIAGNOSIS — R937 Abnormal findings on diagnostic imaging of other parts of musculoskeletal system: Secondary | ICD-10-CM | POA: Insufficient documentation

## 2019-12-16 DIAGNOSIS — M25551 Pain in right hip: Secondary | ICD-10-CM

## 2019-12-16 DIAGNOSIS — M899 Disorder of bone, unspecified: Secondary | ICD-10-CM | POA: Insufficient documentation

## 2019-12-16 DIAGNOSIS — Z952 Presence of prosthetic heart valve: Secondary | ICD-10-CM | POA: Insufficient documentation

## 2019-12-16 DIAGNOSIS — R200 Anesthesia of skin: Secondary | ICD-10-CM

## 2019-12-16 DIAGNOSIS — Z789 Other specified health status: Secondary | ICD-10-CM | POA: Insufficient documentation

## 2019-12-16 DIAGNOSIS — M25512 Pain in left shoulder: Secondary | ICD-10-CM

## 2019-12-16 DIAGNOSIS — M4802 Spinal stenosis, cervical region: Secondary | ICD-10-CM | POA: Insufficient documentation

## 2019-12-17 ENCOUNTER — Other Ambulatory Visit (HOSPITAL_COMMUNITY): Payer: Self-pay | Admitting: Neurology

## 2019-12-17 ENCOUNTER — Other Ambulatory Visit: Payer: Self-pay | Admitting: Neurology

## 2019-12-17 DIAGNOSIS — G43119 Migraine with aura, intractable, without status migrainosus: Secondary | ICD-10-CM

## 2019-12-19 ENCOUNTER — Other Ambulatory Visit: Payer: Self-pay

## 2019-12-19 ENCOUNTER — Ambulatory Visit
Admission: RE | Admit: 2019-12-19 | Discharge: 2019-12-19 | Disposition: A | Discharge status: Home / Self Care | Payer: Managed Care, Other (non HMO) | Source: Ambulatory Visit | Attending: Neurology | Admitting: Neurology

## 2019-12-19 DIAGNOSIS — G43119 Migraine with aura, intractable, without status migrainosus: Secondary | ICD-10-CM | POA: Insufficient documentation

## 2019-12-23 ENCOUNTER — Ambulatory Visit
Admission: RE | Admit: 2019-12-23 | Discharge: 2019-12-23 | Disposition: A | Discharge status: Home / Self Care | Payer: Managed Care, Other (non HMO) | Source: Ambulatory Visit | Attending: Pain Medicine | Admitting: Pain Medicine

## 2019-12-23 ENCOUNTER — Other Ambulatory Visit
Admission: RE | Admit: 2019-12-23 | Discharge: 2019-12-23 | Disposition: A | Discharge status: Home / Self Care | Payer: Managed Care, Other (non HMO) | Source: Ambulatory Visit | Attending: Pain Medicine | Admitting: Pain Medicine

## 2019-12-23 DIAGNOSIS — M25551 Pain in right hip: Secondary | ICD-10-CM | POA: Insufficient documentation

## 2019-12-23 DIAGNOSIS — M4802 Spinal stenosis, cervical region: Secondary | ICD-10-CM | POA: Diagnosis present

## 2019-12-23 DIAGNOSIS — E559 Vitamin D deficiency, unspecified: Secondary | ICD-10-CM | POA: Insufficient documentation

## 2019-12-23 DIAGNOSIS — R519 Headache, unspecified: Secondary | ICD-10-CM | POA: Diagnosis present

## 2019-12-23 DIAGNOSIS — M25562 Pain in left knee: Secondary | ICD-10-CM | POA: Diagnosis present

## 2019-12-23 DIAGNOSIS — M545 Low back pain, unspecified: Secondary | ICD-10-CM

## 2019-12-23 DIAGNOSIS — M25511 Pain in right shoulder: Secondary | ICD-10-CM | POA: Insufficient documentation

## 2019-12-23 DIAGNOSIS — M25552 Pain in left hip: Secondary | ICD-10-CM | POA: Diagnosis present

## 2019-12-23 DIAGNOSIS — G8929 Other chronic pain: Secondary | ICD-10-CM | POA: Diagnosis present

## 2019-12-23 DIAGNOSIS — Z789 Other specified health status: Secondary | ICD-10-CM | POA: Diagnosis not present

## 2019-12-23 DIAGNOSIS — M47816 Spondylosis without myelopathy or radiculopathy, lumbar region: Secondary | ICD-10-CM

## 2019-12-23 DIAGNOSIS — M542 Cervicalgia: Secondary | ICD-10-CM | POA: Diagnosis present

## 2019-12-23 DIAGNOSIS — G4486 Cervicogenic headache: Secondary | ICD-10-CM

## 2019-12-23 DIAGNOSIS — M25512 Pain in left shoulder: Secondary | ICD-10-CM | POA: Diagnosis present

## 2019-12-23 DIAGNOSIS — M5412 Radiculopathy, cervical region: Secondary | ICD-10-CM

## 2019-12-23 DIAGNOSIS — M899 Disorder of bone, unspecified: Secondary | ICD-10-CM | POA: Diagnosis not present

## 2019-12-23 DIAGNOSIS — M25561 Pain in right knee: Secondary | ICD-10-CM | POA: Insufficient documentation

## 2019-12-23 DIAGNOSIS — Z79899 Other long term (current) drug therapy: Secondary | ICD-10-CM | POA: Diagnosis not present

## 2019-12-23 LAB — URINE DRUG SCREEN, QUALITATIVE (ARMC ONLY)
Amphetamines, Ur Screen: NOT DETECTED
Barbiturates, Ur Screen: NOT DETECTED
Benzodiazepine, Ur Scrn: NOT DETECTED
Cannabinoid 50 Ng, Ur ~~LOC~~: NOT DETECTED
Cocaine Metabolite,Ur ~~LOC~~: NOT DETECTED
MDMA (Ecstasy)Ur Screen: NOT DETECTED
Methadone Scn, Ur: NOT DETECTED
Opiate, Ur Screen: NOT DETECTED
Phencyclidine (PCP) Ur S: NOT DETECTED
Tricyclic, Ur Screen: NOT DETECTED

## 2019-12-23 LAB — COMPREHENSIVE METABOLIC PANEL
ALT: 14 U/L (ref 0–44)
AST: 19 U/L (ref 15–41)
Albumin: 4.2 g/dL (ref 3.5–5.0)
Alkaline Phosphatase: 65 U/L (ref 38–126)
Anion gap: 9 (ref 5–15)
BUN: 7 mg/dL (ref 6–20)
CO2: 22 mmol/L (ref 22–32)
Calcium: 9.2 mg/dL (ref 8.9–10.3)
Chloride: 107 mmol/L (ref 98–111)
Creatinine, Ser: 0.73 mg/dL (ref 0.44–1.00)
GFR calc Af Amer: >60 mL/min (ref >60)
GFR calc non Af Amer: >60 mL/min (ref >60)
Glucose, Bld: 105 mg/dL — ABNORMAL HIGH (ref 70–99)
Potassium: 3.9 mmol/L (ref 3.5–5.1)
Sodium: 138 mmol/L (ref 135–145)
Total Bilirubin: 0.6 mg/dL (ref 0.3–1.2)
Total Protein: 8.4 g/dL — ABNORMAL HIGH (ref 6.5–8.1)

## 2019-12-23 LAB — MAGNESIUM: Magnesium: 2.2 mg/dL (ref 1.7–2.4)

## 2019-12-23 LAB — VITAMIN B12: Vitamin B-12: 382 pg/mL (ref 180–914)

## 2019-12-23 LAB — SEDIMENTATION RATE: Sed Rate: 30 mm/hr — ABNORMAL HIGH (ref 0–20)

## 2019-12-23 LAB — C-REACTIVE PROTEIN: CRP: 0.8 mg/dL (ref <1.0)

## 2019-12-24 LAB — VITAMIN D 25 HYDROXY (VIT D DEFICIENCY, FRACTURES): Vit D, 25-Hydroxy: 48.2 ng/mL (ref 30–100)

## 2019-12-25 ENCOUNTER — Encounter: Payer: Self-pay | Admitting: Pain Medicine

## 2019-12-26 ENCOUNTER — Encounter: Payer: Self-pay | Admitting: Pain Medicine

## 2019-12-29 NOTE — Progress Notes (Signed)
Patient: Shelly Huynh  Service Category: E/M  Provider: Gaspar Cola, MD  DOB: Dec 18, 1970  DOS: 12/30/2019  Location: Office  MRN: [id number]  Setting: Ambulatory outpatient  Referring Provider: Tracie Harrier, MD  Type: Established Patient  Specialty: Interventional Pain Management  PCP: Tracie Harrier, MD  Location: Remote location  Delivery: TeleHealth     Virtual Encounter - Pain Management PROVIDER NOTE: Information contained herein reflects review and annotations entered in association with encounter. Interpretation of such information and data should be left to medically-trained personnel. Information provided to patient can be located elsewhere in the medical record under "Patient Instructions". Document created using STT-dictation technology, any transcriptional errors that may result from process are unintentional.    Contact & Pharmacy Preferred: [phone] Home: [phone] (home) Mobile: [phone] (mobile) E-mail: [email or web address] [place], [address] [place] [address] Phone: [phone] Fax: [phone]   Pre-screening note:  Our staff contacted Shelly Huynh and offered her an "in person", "face-to-face" appointment versus a telephone encounter. She indicated preferring the telephone encounter, at this time.   Primary Reason(s) for Virtual Visit: Encounter for evaluation before starting new chronic pain management plan of care (Level of risk: moderate) COVID-19*  Social distancing based on CDC ans AMA recommendations.    I contacted Shelly Huynh on 12/30/2019 via telephone.      I clearly identified myself as Gaspar Cola, MD. I verified that I was speaking with the correct person using two identifiers (Name: Shelly Huynh, and date of birth: 03-04-1971).  Advanced Informed Consent I sought verbal advanced consent from Shelly Huynh for virtual visit interactions. I informed Shelly Huynh of possible security and privacy concerns, risks, and limitations associated with providing "not-in-person" medical evaluation and management services. I also informed Shelly Huynh of the availability of "in-person" appointments. Finally, I informed her that there would be a charge for the virtual visit and that she could be  personally, fully or partially, financially responsible for it. Shelly Huynh expressed understanding and agreed to proceed.   Historic Elements   Shelly Huynh is a 49 y.o. year old, female patient evaluated today after her last encounter by our practice on 12/16/2019. Shelly Huynh  has a past medical history of Acid reflux, Anxiety, Arthritis, CHF (congestive heart failure) ([place]), Depression, Diabetes mellitus without complication ([place]), Heart valve replaced, High cholesterol, High cholesterol, Hypertension, Hypokalemia, IBS (irritable bowel syndrome), and Sciatic pain. She also  has a past surgical history that includes Abdominal hysterectomy; Cesarean section; Hernia repair; Cardiac valuve replacement; Esophagogastroduodenoscopy (egd) with propofol (N/A, 10/07/2016); Breast excisional biopsy (Right, 1992); Breast biopsy (Left, 07/25/2017); Breast biopsy (Right, 10/22/2019); Breast biopsy (Right, 10/22/2019); Breast lumpectomy (Right, 11/20/2019); and Breast biopsy (Right, 11/20/2019). Shelly Huynh has a current medication list which includes the following prescription(s): aimovig, albuterol, amoxicillin, aspirin ec, biotin, candesartan, candesartan, carvedilol, carvedilol, cyclobenzaprine, diazepam, docusate sodium, duloxetine, ergocalciferol, fluticasone, gabapentin, hydrocortisone, iron polysaccharides, levalbuterol, levocetirizine, linaclotide, meclizine, minoxidil, pantoprazole, pravastatin, spironolactone, sucralfate, warfarin, and warfarin. She  reports that she quit smoking about 4 years ago. She has never used smokeless  tobacco. She reports that she does not drink alcohol or use drugs. Shelly Huynh is allergic to ivp dye [iodinated diagnostic agents]; sulfa antibiotics; banana; guaifenesin; hctz [hydrochlorothiazide]; losartan; povidone iodine; povidone-iodine; lisinopril; and mucinex [guaifenesin er].   HPI  [name] being evaluated for review of studies  ordered on initial visit and to consider treatment plan options. Today I went over the results of her tests. These were explained in "Layman's terms". During today's appointment I went over my diagnostic impression, as well as the proposed treatment plan.  According to the patient her primary area of pain is that of the lower back, bilaterally (Bilateral), with the primary pain being in the midline, but the left side is usually worse than the right (L>R).  She denies any prior back surgeries, physical therapy, or recent x-rays, MRIs, or CT scans of the lumbar spine.  She does admit having had to epidurals for cesarean section, but none for her chronic pain.  The patient secondary area pain is that of the lower extremities, bilaterally (Bilateral) with the left being worse than the right (L>R).  She denies any prior surgeries, nerve blocks, physical therapy, or recent x-rays of the lower extremities, including no ultrasounds, MRIs, or CT scans.  In the case of the left lower extremity pain this goes down to the area of the knee through the back and the front of the leg with numbness in the area of her anterior thigh, usually with prolonged sitting or standing.  In the case of the right lower extremity pain this goes all the way down to the ankle running through the back of the leg with occasional numbness into the foot and the lateral aspect of the foot following an S1 dermatomal distribution.  The lower extremity pain also involves the hips, bilaterally (Bilateral), with the left being worse than the right (L>R).  She denies any hip surgery, joint injection, physical  therapy, or recent x-rays.  In addition, it also involves pain going down to both knees (Bilateral), with the left being worse than the right (L>R).  Also the patient denies any knee surgery, knee joint injections, physical therapy, but does admit to an MRI of the right knee.  No imaging of the left knee.  The patient's third area pain is that of the neck, and the posterior aspect of the neck, bilaterally (Bilateral), with the left being worse than the right (L>R).  She denies any prior  Cervical Surgery, no nerve blocks, but she has been having some physical therapy which started in November for her neck and shoulder pain.  This does provide the patient with some temporary relief of the pain.  The patient also has an MRI of the cervical spine that was recently done.  This pain also seems to be associated with occipital headaches, bilaterally (Bilateral).  The fourth area of pain is that of the upper extremities and shoulders with the pain affecting both shoulders (Bilateral), with the left being worse than the right (L>R).  The patient denies any shoulder surgery, nerve blocks, and she does admit to physical therapy which she is currently undergoing for the neck and shoulder pain.  She denies any recent x-rays or imaging of the shoulders.  The patient's fifth area of pain is that of the upper extremities, bilaterally (Bilateral), with the right being worse than the left (R>L).  She denies any surgery, nerve blocks, physical therapy, or recent x-rays or imaging of the upper extremities.  She does complain of bilateral upper extremity numbness that is worse on the right compared to the left (R>L).  [name] also right-handed.  In the case of the right upper extremity pain and numbness this goes all the way into the hand affecting fingers 1, 2, and 3 (thumb, index finger, and middle  finger), following a C6 and C7 dermatomal distribution.  In the case of the left upper extremity the pain and numbness goes all  the way down to fingers 3, 4, and 5.  (Middle finger, ring finger, and pinky finger.)  This seems to follow a C7 and C8 dermatomal distribution.  Today I went over the results of all of her lab work and each one of her x-rays in great detail and explained those results to the patient in layman's terms.  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  I have explained to the patient that she has evidence of a spondylolisthesis in the lumbosacral area that seems to have some degree of movement and she also has severe lumbosacral facet arthropathy affecting the L5-S1 and L4-5 level primarily, but is also affecting the upper levels.  In terms of her symptoms she indicates that her primary pain is that of the lower back, bilaterally followed by the lower extremity pain and she wants [place] to get started with that before we go onto the cervical region.  The patient is on Coumadin and she indicates that she will need to be on that for the rest of her life but usually she comes off of the Coumadin with the help of Dr. Nehemiah Huynh, by doing Lovenox bridge therapy.  In considering the treatment plan options, Ms. Gootee was reminded that I no longer take patients for medication management only. I asked her to let me know if she had no intention of taking advantage of the interventional therapies, so that we could make arrangements to provide this space to someone interested. I also made it clear that undergoing interventional therapies for the purpose of getting pain medications is very inappropriate on the part of a patient, and it will not be tolerated in this practice. This type of behavior would suggest true addiction and therefore it requires referral to an addiction specialist.   I discussed the assessment and treatment plan with the  patient. The patient was provided an opportunity to ask questions and all were answered. The patient agreed with the plan and demonstrated an understanding of the instructions.  Patient advised to call back or seek an in-person evaluation if the symptoms or condition worsens.  Controlled Substance Pharmacotherapy Assessment REMS (Risk Evaluation and Mitigation Strategy)  Analgesic: Tramadol 50 mg 1 tablet p.o. every 6 hours Highest recorded MME/day: 30 mg/day MME/day: 20 mg/day   Monitoring: [place] PMP: PDMP reviewed during this encounter.       Not applicable at this point since we have not taken over the patient's medication management yet. List of other Serum/Urine Drug Screening Test(s):  Lab Results  Component Value Date   COCAINSCRNUR NONE DETECTED 12/23/2019   THCU NONE DETECTED 12/23/2019   List of all UDS test(s) done:  No results found. Last UDS on record: No results found. UDS interpretation: No unexpected findings.          Medication Assessment Form: Patient introduced to form today Treatment compliance: Treatment may start today if patient agrees with proposed plan. Evaluation of compliance is not applicable at this point Risk Assessment Profile: Aberrant behavior: See initial evaluations. None observed or detected today Comorbid factors increasing risk of overdose: See initial evaluation. No additional risks detected today Opioid risk tool (  ORT):  Opioid Risk  12/11/2019  Alcohol 0  Illegal Drugs 0  Rx Drugs 0  Alcohol 0  Illegal Drugs 0  Rx Drugs 0  Age between 16-45 years  0  History of Preadolescent Sexual Abuse 0  Psychological Disease 0  Depression 0  Opioid Risk Tool Scoring 0  Opioid Risk Interpretation Low Risk    ORT Scoring interpretation table:  Score <3 = Low Risk for SUD  Score between 4-7 = Moderate Risk for SUD  Score >8 = High Risk for Opioid Abuse   Risk of substance use disorder (SUD): Low  Risk Mitigation Strategies:  Patient opioid  safety counseling: Completed today. Counseling provided to patient as per "Patient Counseling Document". Document signed by patient, attesting to counseling and understanding Patient-Prescriber Agreement (PPA): Obtained today.  Controlled substance notification to other providers: Written and sent today.  Pharmacologic Plan: Today we may be taking over the patient's pharmacological regimen. See below.             Meds   Current Outpatient Medications:  .  AIMOVIG 140 MG/ML SOAJ, Inject 140 mg into the skin every 28 (twenty-eight) days., Disp: , Rfl:  .  albuterol (PROAIR HFA) 108 (90 BASE) MCG/ACT inhaler, Inhale 2 puffs into the lungs every 4 (four) hours as needed for wheezing or shortness of breath. , Disp: , Rfl:  .  amoxicillin (AMOXIL) 500 MG capsule, Take 2,000 mg by mouth as needed (for dental procedure). , Disp: , Rfl:  .  aspirin EC 81 MG tablet, Take 81 mg daily by mouth., Disp: , Rfl:  .  Biotin 5000 MCG TABS, Take 1 tablet daily by mouth., Disp: , Rfl:  .  candesartan (ATACAND) 16 MG tablet, Take 16 mg by mouth daily., Disp: , Rfl:  .  candesartan (ATACAND) 8 MG tablet, Take 8 mg by mouth daily., Disp: , Rfl:  .  carvedilol (COREG) 3.125 MG tablet, Take 3.125 mg 2 (two) times daily by mouth. , Disp: , Rfl:  .  carvedilol (COREG) 6.25 MG tablet, Take 6.25 mg by mouth 2 (two) times daily., Disp: , Rfl:  .  cyclobenzaprine (FLEXERIL) 10 MG tablet, Take 10 mg by mouth 3 (three) times daily as needed for muscle spasms. , Disp: , Rfl:  .  diazepam (VALIUM) 5 MG tablet, Take 5 mg by mouth every 8 (eight) hours as needed for anxiety or muscle spasms. , Disp: , Rfl:  .  docusate sodium (COLACE) 100 MG capsule, Take 100 mg by mouth daily as needed for mild constipation., Disp: , Rfl:  .  DULoxetine (CYMBALTA) 30 MG capsule, Take 30 mg by mouth at bedtime., Disp: , Rfl:  .  ergocalciferol (VITAMIN D2) 1.25 MG (50000 UT) capsule, Take 50,000 Units by mouth once a week., Disp: , Rfl:  .   fluticasone (FLONASE) 50 MCG/ACT nasal spray, Place 2 sprays into both nostrils daily as needed for allergies or rhinitis., Disp: , Rfl:  .  gabapentin (NEURONTIN) 300 MG capsule, Take 300 mg by mouth at bedtime., Disp: , Rfl:  .  hydrocortisone 2.5 % cream, Apply 1 application topically 2 (two) times a week., Disp: , Rfl:  .  iron polysaccharides (FERREX 150) 150 MG capsule, Take 150 mg by mouth daily., Disp: , Rfl:  .  levalbuterol (XOPENEX) 1.25 MG/0.5ML nebulizer solution, Take 1.25 mg by nebulization every 4 (four) hours as needed for wheezing or shortness of breath., Disp: , Rfl:  .  levocetirizine (XYZAL) 5 MG  tablet, Take 5 mg by mouth every evening. , Disp: , Rfl:  .  Linaclotide (LINZESS) 290 MCG CAPS capsule, Take 290 mcg by mouth daily as needed (constipation). , Disp: , Rfl:  .  meclizine (ANTIVERT) 25 MG tablet, Take 25 mg by mouth 3 (three) times daily as needed for dizziness. , Disp: , Rfl:  .  Minoxidil 5 % FOAM, Apply 1 mL topically daily. , Disp: , Rfl:  .  pantoprazole (PROTONIX) 40 MG tablet, Take 40 mg by mouth 2 (two) times daily. , Disp: , Rfl:  .  pravastatin (PRAVACHOL) 20 MG tablet, Take 20 mg by mouth daily. , Disp: , Rfl:  .  spironolactone (ALDACTONE) 50 MG tablet, Take 75 mg by mouth daily., Disp: , Rfl:  .  sucralfate (CARAFATE) 1 g tablet, Take 1 g by mouth 3 (three) times daily., Disp: , Rfl:  .  warfarin (COUMADIN) 6 MG tablet, Take 6 mg by mouth See admin instructions. Mon and Tues, WED, Disp: , Rfl:  .  warfarin (COUMADIN) 7.5 MG tablet, Take 1 tablet (7.5 mg total) one time only at 6 PM by mouth. (Patient taking differently: Take 7.5 mg by mouth See admin instructions. Thurs- Nancy Fetter), Disp: 30 tablet, Rfl: 0  Laboratory Chemistry Profile   Screening Lab Results  Component Value Date   SARSCOV2NAA NEGATIVE 11/19/2019   MRSAPCR NEGATIVE 10/03/2017    Inflammation (CRP: Acute Phase) (ESR: Chronic Phase) Lab Results  Component Value Date   CRP 0.8 12/23/2019    ESRSEDRATE 30 (H) 12/23/2019   LATICACIDVEN 0.9 06/12/2017    Rheumatology No results found.  Renal Lab Results  Component Value Date   BUN 7 12/23/2019   CREATININE 0.73 12/23/2019   GFRAA >60 12/23/2019   GFRNONAA >60 12/23/2019    Hepatic Lab Results  Component Value Date   AST 19 12/23/2019   ALT 14 12/23/2019   ALBUMIN 4.2 12/23/2019   ALKPHOS 65 12/23/2019   LIPASE 33 06/14/2014    Electrolytes Lab Results  Component Value Date   NA 138 12/23/2019   K 3.9 12/23/2019   CL 107 12/23/2019   CALCIUM 9.2 12/23/2019   MG 2.2 12/23/2019   PHOS 3.6 03/05/2015    Neuropathy Lab Results  Component Value Date   VITAMINB12 382 12/23/2019    CNS No results found.  Bone Lab Results  Component Value Date   VD25OH 48.2 12/23/2019    Coagulation Lab Results  Component Value Date   INR 1.45 10/04/2017   LABPROT 17.5 (H) 10/04/2017   APTT 41 (H) 05/29/2015   PLT 424 10/04/2017    Cardiovascular Lab Results  Component Value Date   BNP 215.0 (H) 03/05/2015   TROPONINI <0.03 10/02/2017   HGB 11.2 (L) 10/04/2017   HCT 34.8 (L) 10/04/2017    ID Lab Results  Component Value Date   SARSCOV2NAA NEGATIVE 11/19/2019   MRSAPCR NEGATIVE 10/03/2017    Cancer No results found.  Endocrine Lab Results  Component Value Date   TSH 2.840 10/02/2017    Note: Lab results reviewed.  Recent Diagnostic Imaging Review  Cervical Imaging: Cervical MR wo contrast:  Results for orders placed during the hospital encounter of 09/06/19  MR CERVICAL SPINE WO CONTRAST   Narrative CLINICAL DATA:  Bilateral shoulder pain radiating down the arms for 2 months.  EXAM: MRI CERVICAL SPINE WITHOUT CONTRAST  TECHNIQUE: Multiplanar, multisequence MR imaging of the cervical spine was performed. No intravenous contrast was administered.  COMPARISON:  None.  FINDINGS: Alignment: Loss of the normal cervical lordosis, which can be associated with muscle spasm. No  subluxation.  Vertebrae: Mild disc desiccation throughout the cervical spine. 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  Cord: No significant abnormal spinal cord signal is observed.  Posterior Fossa, vertebral arteries, paraspinal tissues: Unremarkable  Disc levels:  C2-3: Mild left foraminal stenosis due to uncinate spurring.  C3-4: Borderline central narrowing of the thecal sac due to short pedicles. Bilateral uncinate spurring.  C4-5: Borderline central narrowing of the thecal sac and borderline left foraminal stenosis due to short pedicles and uncinate spurring.  C5-6: Moderate left eccentric central narrowing of the thecal sac due to left paracentral disc protrusion, image 17/12.  C6-7: Borderline central narrowing of the thecal sac due to short pedicles.  C7-T1: Unremarkable.  IMPRESSION: 1. Moderate left eccentric central narrowing of the thecal sac at C5-6 due to a left paracentral disc protrusion. 2. Mild left foraminal stenosis at C2-3 due to uncinate spurring. 3. 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 4. Loss of the normal cervical lordosis, which can be associated with muscle spasm.   Electronically Signed   By: Van Clines M.D.   On: 09/06/2019 14:29    Cervical DG Bending/F/E views:  Results for orders placed during the hospital encounter of 12/23/19  DG Cervical Spine With Flex & Extend   Narrative CLINICAL DATA:  Cervicalgia. Chronic neck and low back and bilateral shoulder pain.  EXAM: CERVICAL SPINE COMPLETE WITH FLEXION AND EXTENSION VIEWS  COMPARISON:  Cervical MRI dated 09/06/2019  FINDINGS: Cervical alignment is normal with no abnormal subluxation with flexion or extension. Prevertebral soft tissues are normal. No foraminal stenosis or facet arthritis.  IMPRESSION: No significant abnormality of the cervical spine.   Electronically Signed   By: Lorriane Shire M.D.   On: 12/24/2019 07:01    Shoulder  Imaging: Shoulder-R DG:  Results for orders placed during the hospital encounter of 12/23/19  DG Shoulder Right   Narrative CLINICAL DATA:  Shoulder pain.  EXAM: RIGHT SHOULDER - 2+ VIEW  COMPARISON:  None.  FINDINGS: There is no evidence of fracture or dislocation. Minimal degenerative changes of the distal clavicle with no spur formation. Soft tissues are unremarkable.  IMPRESSION: Minimal degenerative changes of the distal clavicle. Otherwise normal.   Electronically Signed   By: Lorriane Shire M.D.   On: 12/24/2019 07:02    Shoulder-L DG:  Results for orders placed during the hospital encounter of 12/23/19  DG Shoulder Left   Narrative CLINICAL DATA:  Pain.  EXAM: LEFT SHOULDER - 2+ VIEW  COMPARISON:  None.  FINDINGS: There is no evidence of fracture or dislocation. There is no evidence of arthropathy or other focal bone abnormality. Soft tissues are unremarkable.  IMPRESSION: Normal exam.   Electronically Signed   By: Lorriane Shire M.D.   On: 12/24/2019 07:03    Lumbosacral Imaging: Lumbar MR wo contrast:  Results for orders placed during the hospital encounter of 08/23/15  MR Lumbar Spine Wo Contrast   Narrative CLINICAL DATA:  49 year old female with lower back and bilateral lower extremity weakness. Pain extends from buttocks inferiorly. Intermittent numbness of the feet. 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 Initial encounter.  EXAM: MRI LUMBAR SPINE WITHOUT CONTRAST  TECHNIQUE: Multiplanar, multisequence MR imaging of the lumbar spine was performed. No intravenous contrast was administered.  COMPARISON:  06/22/2013 MR.  FINDINGS: Last fully open disk space is labeled L5-S1. Present examination incorporates from  T10-11 disc space through lower sacrum.  Conus slightly low lying at the L2-3 level. Linear increased signal within the distal cord/conus felt to be related to artifact rather than true  abnormality.  3.8 cm fluid appearing structure left aspect of the pelvis suggestive of ovarian cyst incompletely imaged/ evaluated on present exam. Atherosclerotic type changes with slight ectasia left common iliac artery.  Mild curvature of the lumbar spine.  T10-11 through L3-4 unremarkable.  L4-5: Facet bony overgrowth. No significant spinal stenosis or foraminal narrowing.  L5-S1: Prominent facet joint degenerative changes with bony overgrowth. Ligamentum flavum hypertrophy. Mild bulge and foraminal spur. Bilateral foraminal narrowing with slight encroachment upon but not significant compression of the exiting L5 nerve roots. No significant spinal stenosis.  IMPRESSION: L5-S1 multifactorial bilateral foraminal narrowing with slight encroachment upon but not significant compression of the exiting L5 nerve roots. Minimal change since the prior MR.  3.8 cm fluid appearing structure left aspect of the pelvis suggestive of ovarian cyst incompletely imaged/ evaluated on present exam. Atherosclerotic type changes with slight ectasia left common iliac artery (measuring up to 1.3 cm).   Electronically Signed   By: Genia Del M.D.   On: 08/24/2015 06:49    Lumbar DG (Complete) 4+V:  Results for orders placed during the hospital encounter of 06/28/12  DG Lumbar Spine Complete   Narrative *RADIOLOGY REPORT*  Clinical Data: Low back pain  LUMBAR SPINE - COMPLETE 4+ VIEW  Comparison: None.  Findings: Five non-rib bearing lumbar type vertebral bodies are identified.  Normal alignment.  No compression deformity.  Normal visualized bowel gas pattern.  IMPRESSION: No acute osseous abnormality of the lumbar spine.  Original Report Authenticated By: Arline Asp, M.D.         Lumbar DG Bending views:  Results for orders placed during the hospital encounter of 12/23/19  DG Lumbar Spine Complete W/Bend   Narrative CLINICAL DATA:  Low back pain.  EXAM: LUMBAR SPINE  - COMPLETE WITH BENDING VIEWS  COMPARISON:  Radiographs dated 06/28/2012 and lumbar MRI dated 08/23/2015  FINDINGS: There has been significant progression of bilateral facet arthritis at L5-S1 with new slight degenerative changes of the facet joints bilaterally at L4-5. There is new grade 1 spondylolisthesis at L5-S1 which slightly increases with flexion and slightly reduces with extension.  Lateral alignment is otherwise normal. No disc space narrowing.  Aortic atherosclerosis.  IMPRESSION: 1. Progressive now severe bilateral facet arthritis at L5-S1 with new grade 1 spondylolisthesis which slightly increases with flexion and reduces with extension. 2. New slight facet arthritis at L4-5. 3. Aortic atherosclerosis.   Electronically Signed   By: Lorriane Shire M.D.   On: 12/24/2019 07:06          Hip Imaging: Hip-R DG 2-3 views:  Results for orders placed during the hospital encounter of 12/23/19  DG HIP UNILAT W OR W/O PELVIS 2-3 VIEWS RIGHT   Narrative CLINICAL DATA:  Hip pain.  EXAM: DG HIP (WITH OR WITHOUT PELVIS) 2-3V RIGHT  COMPARISON:  None.  FINDINGS: There is no evidence of hip fracture or dislocation. There is no evidence of arthropathy or other focal bone abnormality of the right hip. Severe bilateral facet arthritis at L5-S1. The sacroiliac joints appear normal.  IMPRESSION: Normal right hip. Severe bilateral facet arthritis at L5-S1.   Electronically Signed   By: Lorriane Shire M.D.   On: 12/24/2019 07:08    Hip-L DG 2-3 views:  Results for orders placed during the hospital encounter of 12/23/19  DG HIP UNILAT W OR W/O PELVIS 2-3 VIEWS LEFT   Narrative CLINICAL DATA:  Left hip pain.  EXAM: DG HIP (WITH OR WITHOUT PELVIS) 2-3V LEFT  COMPARISON:  CT scan of the abdomen dated 07/03/2018  FINDINGS: There is no evidence of hip fracture or dislocation. There is no evidence of arthropathy or other focal bone abnormality of the  left hip.  There is a small soft tissue calcification adjacent to the left psoas muscle at the L4 level. This was not visible on the prior CT scan of the abdomen dated 07/03/2018. Does the patient have any symptoms suggestive of a ureteral stone?  IMPRESSION: Normal left hip.  Calcification in the left side of the abdomen could possibly represent a left ureteral stone.   Electronically Signed   By: Lorriane Shire M.D.   On: 12/24/2019 07:10    Knee Imaging: Knee-R MR wo contrast:  Results for orders placed during the hospital encounter of 04/11/19  MR KNEE RIGHT WO CONTRAST   Narrative CLINICAL DATA:  Acute right knee pain after standing up from her desk at work and feeling a pulling sensation posteriorly.  EXAM: MRI OF THE RIGHT KNEE WITHOUT CONTRAST  TECHNIQUE: Multiplanar, multisequence MR imaging of the knee was performed. No intravenous contrast was administered.  COMPARISON:  Right knee x-rays dated June 28, 2012.  FINDINGS: MENISCI  Medial meniscus:  Intact.  Lateral meniscus:  Intact.  LIGAMENTS  Cruciates:  Intact ACL and PCL.  Collaterals: Medial collateral ligament is intact. Lateral collateral ligament complex is intact.  CARTILAGE  Patellofemoral: Early patellar cartilage degenerative signal. No focal defect.  Medial:  No chondral defect.  Lateral:  No chondral defect.  Joint:  No joint effusion. Normal Hoffa's fat. No plical thickening.  Popliteal Fossa:  No Baker cyst. Intact popliteus tendon.  Extensor Mechanism: Intact quadriceps tendon and patellar tendon. Intact medial and lateral patellar retinaculum. Intact MPFL.  Bones: No focal marrow signal abnormality. No fracture or dislocation.  Other: Mild nonspecific edema in the popliteal fossa. No soft tissue mass or fluid collection.  IMPRESSION: 1. No meniscal or ligamentous injury.   Electronically Signed   By: Titus Dubin M.D.   On: 04/12/2019 08:34    Knee-R DG  1-2 views:  Results for orders placed during the hospital encounter of 12/23/19  DG Knee 1-2 Views Right   Narrative CLINICAL DATA:  Right knee pain.  EXAM: RIGHT KNEE - 1-2 VIEW  COMPARISON:  None.  FINDINGS: No evidence of fracture, dislocation, or joint effusion. No evidence of arthropathy or other focal bone abnormality. Soft tissues are unremarkable.  IMPRESSION: Normal exam.   Electronically Signed   By: Lorriane Shire M.D.   On: 12/24/2019 07:10    Knee-L DG 1-2 views:  Results for orders placed during the hospital encounter of 12/23/19  DG Knee 1-2 Views Left   Narrative CLINICAL DATA:  Left knee pain.  EXAM: LEFT KNEE - 1-2 VIEW  COMPARISON:  None.  FINDINGS: No evidence of fracture, dislocation, or joint effusion. No evidence of arthropathy or other focal bone abnormality. Soft tissues are unremarkable.  IMPRESSION: Normal exam.   Electronically Signed   By: Lorriane Shire M.D.   On: 12/24/2019 07:11    Knee-R DG 4 views:  Results for orders placed during the hospital encounter of 06/28/12  DG Knee Complete 4 Views Right   Narrative *RADIOLOGY REPORT*  Clinical Data: Right knee pain  RIGHT KNEE - COMPLETE 4+ VIEW  Comparison:  None.  Findings: Presumed bone island noted within the distal right femur. No fracture or dislocation.  No suprapatellar effusion.  IMPRESSION: No acute osseous abnormality.  Original Report Authenticated By: Arline Asp, M.D.   Complexity Note: Imaging results reviewed. Results shared with Ms. Krebbs, using Layman's terms.                        Assessment  The primary encounter diagnosis was Chronic pain syndrome. Diagnoses of Chronic low back pain (1ry area of Pain) (Bilateral) (L>R) w/o sciatica, Lumbar facet syndrome (Bilateral) (L>R), Lumbar facet hypertrophy (L4-5 and L5-S1) (Bilateral), Chronic lower extremity pain (2ry area of Pain) (Bilateral) (L>R), Chronic hip pain (3ry area of Pain) (Bilateral)  (L>R), Chronic knee pain (4th area of Pain) (Bilateral) (L>R), Chronic neck pain (5th area of Pain) (Bilateral) (L>R), Cervicogenic headache (6th area of Pain) (Bilateral), Chronic upper extremity pain (7th area of Pain) (Bilateral) (R>L), and Chronic anticoagulation (Coumadin) were also pertinent to this visit.  Plan of Care  I have discontinued Tiani H. Hinde's potassium chloride. I am also having her maintain her cyclobenzaprine, pravastatin, amoxicillin, carvedilol, albuterol, diazepam, linaclotide, meclizine, pantoprazole, aspirin EC, Biotin, levocetirizine, warfarin, candesartan, docusate sodium, DULoxetine, fluticasone, gabapentin, hydrocortisone, iron polysaccharides, levalbuterol, Minoxidil, spironolactone, warfarin, ergocalciferol, Aimovig, sucralfate, candesartan, and carvedilol. Pharmacotherapy (Medications Ordered): No orders of the defined types were placed in this encounter.   Procedure Orders     LUMBAR FACET(MEDIAL BRANCH NERVE BLOCK) MBNB Lab Orders  No laboratory test(s) ordered today   Imaging Orders  No imaging studies ordered today   Referral Orders  No referral(s) requested today    Orders:  Orders Placed This Encounter  Procedures  . LUMBAR FACET(MEDIAL BRANCH NERVE BLOCK) MBNB    Standing Status:   Future    Standing Expiration Date:   01/27/2020    Scheduling Instructions:     Procedure: Lumbar facet block (AKA.: Lumbosacral medial branch nerve block)     Side: Bilateral     Level: L3-4, L4-5, & L5-S1 Facets (L2, L3, L4, L5, & S1 Medial Branch Nerves)     Sedation: Patient's choice.     Timeframe: ASAA    Order Specific Question:   Where will this procedure be performed?    Answer:   [facility]  . Blood Thinner Instructions to Nursing    If unable to stop, ask if Lovenox-bridge therapy may be possible, and if so, request their assistance in implementing it.    Scheduling Instructions:     Contact the physician prescribing the blood thinner  and request clearance to stop it for time period stipulated below.     If approved by prescribing physician, stop Coumadin (Warfarin) X 5 days prior to procedure or surgery.  Always arrange for Lovenox bridge therapy by Dr. Nehemiah Huynh  . Blood Thinner Instructions to Nursing    If the patient requires a Lovenox-bridge therapy, make sure arrangements are made to institute it with the assistance of the PCP.    Standing Status:   Standing    Number of Occurrences:   36    Standing Expiration Date:   06/28/2021    Scheduling Instructions:     Always stop the Coumadin (Warfarin) X 5 days prior to procedure or surgery.  Please arrange for Lovenox bridge therapy by Dr. Nehemiah Huynh   Pharmacological management options:  Opioid Analgesics: We'll take over management today. See above orders Membrane stabilizer: Options discussed, including a trial. Muscle relaxant:  We have discussed the possibility of a trial NSAID: Trial discussed. Other analgesic(s): To be determined at a later time    Interventional management options: Planned, scheduled, and/or pending:   NOTE: COUMADIN ANTICOAGULATION (Lovenox bridge therapy by Dr. Nehemiah Huynh) Diagnostic bilateral lumbar facet block #1    Considering:   Diagnostic bilateral lumbar facet block  Diagnostic L4-5 LESI  Diagnostic bilateral L5 TFESI    PRN Procedures:   None at this time    Total duration of non-face-to-face encounter: 35 minutes.  Follow-up plan:   Return for Procedure (w/ sedation): (B) L-FCT Blk #1, (Blood-thinner Protocol).    Recent Visits Date Type Provider Dept  12/16/19 Office Visit Milinda Pointer, MD [facility]  Showing recent visits within past 90 days and meeting all other requirements   Today's Visits Date Type Provider Dept  12/30/19 Office Visit Milinda Pointer, MD [facility]  Showing today's visits and meeting all other requirements   Future Appointments No visits were found meeting these  conditions.  Showing future appointments within next 90 days and meeting all other requirements   Primary Care Physician: Tracie Harrier, MD Location: Telephone Virtual Visit Note by: Gaspar Cola, MD Date: 12/30/2019; Time: 12:27 PM  Note: This dictation was prepared with Dragon dictation. Any transcriptional errors that may result from this process are unintentional.  Disclaimer:  * Given the special circumstances of the COVID-19 pandemic, the federal government has announced that the Office for Civil Rights (OCR) will exercise its enforcement discretion and will not impose penalties on physicians using telehealth in the event of noncompliance with regulatory requirements under the [place] (HIPAA) in connection with the good faith provision of telehealth during the PYPPJ-09 national public health emergency. ([place])

## 2019-12-30 ENCOUNTER — Other Ambulatory Visit: Payer: Self-pay

## 2019-12-30 ENCOUNTER — Telehealth: Payer: Self-pay | Admitting: *Deleted

## 2019-12-30 ENCOUNTER — Ambulatory Visit: Payer: Managed Care, Other (non HMO) | Attending: Pain Medicine | Admitting: Pain Medicine

## 2019-12-30 DIAGNOSIS — M79605 Pain in left leg: Secondary | ICD-10-CM

## 2019-12-30 DIAGNOSIS — M25552 Pain in left hip: Secondary | ICD-10-CM

## 2019-12-30 DIAGNOSIS — G894 Chronic pain syndrome: Secondary | ICD-10-CM | POA: Diagnosis not present

## 2019-12-30 DIAGNOSIS — M79604 Pain in right leg: Secondary | ICD-10-CM | POA: Diagnosis not present

## 2019-12-30 DIAGNOSIS — M25562 Pain in left knee: Secondary | ICD-10-CM

## 2019-12-30 DIAGNOSIS — Z7901 Long term (current) use of anticoagulants: Secondary | ICD-10-CM

## 2019-12-30 DIAGNOSIS — M545 Low back pain: Secondary | ICD-10-CM

## 2019-12-30 DIAGNOSIS — M25561 Pain in right knee: Secondary | ICD-10-CM

## 2019-12-30 DIAGNOSIS — M47816 Spondylosis without myelopathy or radiculopathy, lumbar region: Secondary | ICD-10-CM

## 2019-12-30 DIAGNOSIS — G4486 Cervicogenic headache: Secondary | ICD-10-CM

## 2019-12-30 DIAGNOSIS — M25551 Pain in right hip: Secondary | ICD-10-CM

## 2019-12-30 DIAGNOSIS — M79601 Pain in right arm: Secondary | ICD-10-CM

## 2019-12-30 DIAGNOSIS — G8929 Other chronic pain: Secondary | ICD-10-CM

## 2019-12-30 DIAGNOSIS — M79602 Pain in left arm: Secondary | ICD-10-CM

## 2019-12-30 DIAGNOSIS — M542 Cervicalgia: Secondary | ICD-10-CM

## 2019-12-30 DIAGNOSIS — R519 Headache, unspecified: Secondary | ICD-10-CM

## 2019-12-30 NOTE — Patient Instructions (Signed)
____________________________________________________________________________________________  Preparing for Procedure with Sedation  Procedure appointments are limited to planned procedures: . No Prescription Refills. . No disability issues will be discussed. . No medication changes will be discussed.  Instructions: . Oral Intake: Do not eat or drink anything for at least 8 hours prior to your procedure. . Transportation: Public transportation is not allowed. Bring an adult driver. The driver must be physically present in our waiting room before any procedure can be started. Marland Kitchen Physical Assistance: Bring an adult physically capable of assisting you, in the event you need help. This adult should keep you company at home for at least 6 hours after the procedure. . Blood Pressure Medicine: Take your blood pressure medicine with a sip of water the morning of the procedure. . Blood thinners: Notify our staff if you are taking any blood thinners. Depending on which one you take, there will be specific instructions on how and when to stop it. . Diabetics on insulin: Notify the staff so that you can be scheduled 1st case in the morning. If your diabetes requires high dose insulin, take only  of your normal insulin dose the morning of the procedure and notify the staff that you have done so. . Preventing infections: Shower with an antibacterial soap the morning of your procedure. . Build-up your immune system: Take 1000 mg of Vitamin C with every meal (3 times a day) the day prior to your procedure. Marland Kitchen Antibiotics: Inform the staff if you have a condition or reason that requires you to take antibiotics before dental procedures. . Pregnancy: If you are pregnant, call and cancel the procedure. . Sickness: If you have a cold, fever, or any active infections, call and cancel the procedure. . Arrival: You must be in the facility at least 30 minutes prior to your scheduled procedure. . Children: Do not bring  children with you. . Dress appropriately: Bring dark clothing that you would not mind if they get stained. . Valuables: Do not bring any jewelry or valuables.  Reasons to call and reschedule or cancel your procedure: (Following these recommendations will minimize the risk of a serious complication.) . Surgeries: Avoid having procedures within 2 weeks of any surgery. (Avoid for 2 weeks before or after any surgery). . Flu Shots: Avoid having procedures within 2 weeks of a flu shots or . (Avoid for 2 weeks before or after immunizations). . Barium: Avoid having a procedure within 7-10 days after having had a radiological study involving the use of radiological contrast. (Myelograms, Barium swallow or enema study). . Heart attacks: Avoid any elective procedures or surgeries for the initial 6 months after a "Myocardial Infarction" (Heart Attack). . Blood thinners: It is imperative that you stop these medications before procedures. Let us know if you if you take any blood thinner.  . Infection: Avoid procedures during or within two weeks of an infection (including chest colds or gastrointestinal problems). Symptoms associated with infections include: Localized redness, fever, chills, night sweats or profuse sweating, burning sensation when voiding, cough, congestion, stuffiness, runny nose, sore throat, diarrhea, nausea, vomiting, cold or Flu symptoms, recent or current infections. It is specially important if the infection is over the area that we intend to treat. Marland Kitchen Heart and lung problems: Symptoms that may suggest an active cardiopulmonary problem include: cough, chest pain, breathing difficulties or shortness of breath, dizziness, ankle swelling, uncontrolled high or unusually low blood pressure, and/or palpitations. If you are experiencing any of these symptoms, cancel your procedure and contact  your primary care physician for an evaluation.  Remember:  Regular Business hours are:  Monday to Thursday  8:00 AM to 4:00 PM  Provider's Schedule: Milinda Pointer, MD:  Procedure days: Tuesday and Thursday 7:30 AM to 4:00 PM  Gillis Santa, MD:  Procedure days: Monday and Wednesday 7:30 AM to 4:00 PM ____________________________________________________________________________________________   ____________________________________________________________________________________________  Blood Thinners  IMPORTANT NOTICE:  If you take any of these, make sure to notify the nursing staff.  Failure to do so may result in injury.  Recommended time intervals to stop and restart blood-thinners, before & after invasive procedures  Generic Name Brand Name Stop Time. Must be stopped at least this long before procedures. After procedures, wait at least this long before re-starting.  Abciximab Reopro 15 days 2 hrs  Alteplase Activase 10 days 10 days  Anagrelide Agrylin    Apixaban Eliquis 3 days 6 hrs  Cilostazol Pletal 3 days 5 hrs  Clopidogrel Plavix 7-10 days 2 hrs  Dabigatran Pradaxa 5 days 6 hrs  Dalteparin Fragmin 24 hours 4 hrs  Dipyridamole Aggrenox 11days 2 hrs  Edoxaban Lixiana; Savaysa 3 days 2 hrs  Enoxaparin  Lovenox 24 hours 4 hrs  Eptifibatide Integrillin 8 hours 2 hrs  Fondaparinux  Arixtra 72 hours 12 hrs  Prasugrel Effient 7-10 days 6 hrs  Reteplase Retavase 10 days 10 days  Rivaroxaban Xarelto 3 days 6 hrs  Ticagrelor Brilinta 5-7 days 6 hrs  Ticlopidine Ticlid 10-14 days 2 hrs  Tinzaparin Innohep 24 hours 4 hrs  Tirofiban Aggrastat 8 hours 2 hrs  Warfarin Coumadin 5 days 2 hrs   Other medications with blood-thinning effects  Product indications Generic (Brand) names Note  Cholesterol Lipitor Stop 4 days before procedure  Blood thinner (injectable) Heparin (LMW or LMWH Heparin) Stop 24 hours before procedure  Cancer Ibrutinib (Imbruvica) Stop 7 days before procedure  Malaria/Rheumatoid Hydroxychloroquine (Plaquenil) Stop 11 days before procedure  Thrombolytics  10  days before or after procedures   Over-the-counter (OTC) Products with blood-thinning effects  Product Common names Stop Time  Aspirin > 325 mg Goody Powders, Excedrin, etc. 11 days  Aspirin ? 81 mg  7 days  Fish oil  4 days  Garlic supplements  7 days  Ginkgo biloba  36 hours  Ginseng  24 hours  NSAIDs Ibuprofen, Naprosyn, etc. 3 days  Vitamin E  4 days   ____________________________________________________________________________________________   Facet Joint Block The facet joints connect the bones of the spine (vertebrae). They make it possible for you to bend, twist, and make other movements with your spine. They also keep you from bending too far, twisting too far, and making other extreme movements. A facet joint block is a procedure in which a numbing medicine (anesthetic) is injected into a facet joint. In many cases, an anti-inflammatory medicine (steroid) is also injected. A facet joint block may be done:  To diagnose neck or back pain. If the pain gets better after a facet joint block, it means the pain is probably coming from the facet joint. If the pain does not get better, it means the pain is probably not coming from the facet joint.  To relieve neck or back pain that is caused by an inflamed facet joint. A facet joint block is only done to relieve pain if the pain does not improve with other methods, such as medicine, exercise programs, and physical therapy. Tell a health care provider about:  Any allergies you have.  All medicines you are taking,  including vitamins, herbs, eye drops, creams, and over-the-counter medicines.  Any problems you or family members have had with anesthetic medicines.  Any blood disorders you have.  Any surgeries you have had.  Any medical conditions you have or have had.  Whether you are pregnant or may be pregnant. What are the risks? Generally, this is a safe procedure. However, problems may occur, including:  Bleeding.   Injury to a nerve near the injection site.  Pain at the injection site.  Weakness or numbness in areas controlled by nerves near the injection site.  Infection.  Temporary fluid retention.  Allergic reactions to medicines or dyes.  Injury to other structures or organs near the injection site. What happens before the procedure? Medicines Ask your health care provider about:  Changing or stopping your regular medicines. This is especially important if you are taking diabetes medicines or blood thinners.  Taking medicines such as aspirin and ibuprofen. These medicines can thin your blood. Do not take these medicines unless your health care provider tells you to take them.  Taking over-the-counter medicines, vitamins, herbs, and supplements. Eating and drinking Follow instructions from your health care provider about eating and drinking, which may include:  8 hours before the procedure - stop eating heavy meals or foods, such as meat, fried foods, or fatty foods.  6 hours before the procedure - stop eating light meals or foods, such as toast or cereal.  6 hours before the procedure - stop drinking milk or drinks that contain milk.  2 hours before the procedure - stop drinking clear liquids. Staying hydrated Follow instructions from your health care provider about hydration, which may include:  Up to 2 hours before the procedure - you may continue to drink clear liquids, such as water, clear fruit juice, black coffee, and plain tea. General instructions  Do not use any products that contain nicotine or tobacco for at least 4-6 weeks before the procedure. These products include cigarettes, e-cigarettes, and chewing tobacco. If you need help quitting, ask your health care provider.  Plan to have someone take you home from the hospital or clinic.  Ask your health care provider: ? How your surgery site will be marked. ? What steps will be taken to help prevent infection. These may  include:  Removing hair at the surgery site.  Washing skin with a germ-killing soap.  Receiving antibiotic medicine. What happens during the procedure?   You will put on a hospital gown.  You will lie on your stomach on an X-ray table. You may be asked to lie in a different position if an injection will be made in your neck.  Machines will be used to monitor your oxygen levels, heart rate, and blood pressure.  Your skin will be cleaned.  If an injection will be made in your neck, an IV will be inserted into one of your veins. Fluids and medicine will flow directly into your body through the IV.  A numbing medicine (local anesthetic) will be applied to your skin. Your skin may sting or burn for a moment.  A video X-ray machine (fluoroscopy) will be used to find the joint. In some cases, a CT scan may be used.  A contrast dye may be injected into the facet joint area to help find the joint.  When the joint is located, an anesthetic will be injected into the joint through the needle.  Your health care provider will ask you whether you feel pain relief. ? If  you feel relief, a steroid may be injected to provide pain relief for a longer period of time. ? If you do not feel relief or feel only partial relief, additional injections of an anesthetic may be made in other facet joints.  The needle will be removed.  Your skin will be cleaned.  A bandage (dressing) will be applied over each injection site. The procedure may vary among health care providers and hospitals. What happens after the procedure?  Your blood pressure, heart rate, breathing rate, and blood oxygen level will be monitored until you leave the hospital or clinic.  You will lie down and rest for a period of time. Summary  A facet joint block is a procedure in which a numbing medicine (anesthetic) is injected into a facet joint. An anti-inflammatory medicine (stereoid) may also be injected.  Follow instructions  from your health care provider about medicines and eating and drinking before the procedure.  Do not use any products that contain nicotine or tobacco for at least 4-6 weeks before the procedure.  You will lie on your stomach for the procedure, but you may be asked to lie in a different position if an injection will be made in your neck.  When the joint is located, an anesthetic will be injected into the joint through the needle. This information is not intended to replace advice given to you by your health care provider. Make sure you discuss any questions you have with your health care provider. Document Revised: 02/28/2019 Document Reviewed: 10/12/2018 Elsevier Patient Education  [place].

## 2019-12-30 NOTE — Telephone Encounter (Signed)
Dr. Dossie Arbour would like to schedule this patient for a lumbar facet block at the pain clinic. We will need clearance from you and instructions for a Lovenox bridge so that she can stop her blood thinner. Thank you- Anderson Malta RN

## 2020-01-02 NOTE — Telephone Encounter (Signed)
She called to let you know she wants sedation

## 2020-01-02 NOTE — Telephone Encounter (Signed)
Pt returned the call stating that she's on Coumadin. Pt wants to if she needs clearance from her Cardiologist? Please return her call.                                          Thanks

## 2020-01-07 ENCOUNTER — Telehealth: Payer: Self-pay | Admitting: *Deleted

## 2020-01-07 ENCOUNTER — Ambulatory Visit: Payer: Managed Care, Other (non HMO) | Admitting: Pain Medicine

## 2020-01-07 ENCOUNTER — Encounter: Payer: Self-pay | Admitting: Pain Medicine

## 2020-01-07 DIAGNOSIS — M5137 Other intervertebral disc degeneration, lumbosacral region: Secondary | ICD-10-CM | POA: Insufficient documentation

## 2020-01-07 DIAGNOSIS — Z91041 Radiographic dye allergy status: Secondary | ICD-10-CM | POA: Insufficient documentation

## 2020-01-07 DIAGNOSIS — Z888 Allergy status to other drugs, medicaments and biological substances status: Secondary | ICD-10-CM | POA: Insufficient documentation

## 2020-01-07 NOTE — Progress Notes (Deleted)
PROVIDER NOTE: Information contained herein reflects review and annotations entered in association with encounter. Interpretation of such information and data should be left to medically-trained personnel. Information provided to patient can be located elsewhere in the medical record under "Patient Instructions". Document created using STT-dictation technology, any transcriptional errors that may result from process are unintentional.    Patient: Shelly Huynh  Service Category: Procedure  Provider: Gaspar Cola, MD  DOB: Jan 05, 1971  DOS: 01/07/2020  Location: [facility]  MRN: [id number]  Setting: Ambulatory - outpatient  Referring Provider: Milinda Pointer, MD  Type: Established Patient  Specialty: Interventional Pain Management  PCP: Tracie Harrier, MD   Primary Reason for Visit: Interventional Pain Management Treatment. CC: No chief complaint on file.  Procedure:          Anesthesia, Analgesia, Anxiolysis:  Type: Lumbar Facet, Medial Branch Block(s) #1  Primary Purpose: Diagnostic Region: Posterolateral Lumbosacral Spine Level: L2, L3, L4, L5, & S1 Medial Branch Level(s). Injecting these levels blocks the L3-4, L4-5, and L5-S1 lumbar facet joints. Laterality: Bilateral  Type: Moderate (Conscious) Sedation combined with Local Anesthesia Indication(s): Analgesia and Anxiety Route: Intravenous (IV) IV Access: Secured Sedation: Meaningful verbal contact was maintained at all times during the procedure  Local Anesthetic: Lidocaine 1-2%  Position: Prone   Indications: 1. Lumbar facet syndrome (Bilateral) (L>R)   2. Spondylosis of lumbar region without myelopathy or radiculopathy   3. DDD (degenerative disc disease), lumbosacral   4. Lumbar facet hypertrophy (L4-5 and L5-S1) (Bilateral)    H/O prosthetic heart valve    H/O aortic valve replacement    Chronic anticoagulation (Coumadin)    History of Allergy to iodine    History of allergy to radiographic  contrast media    BMI 40.0-44.9, adult (HCC)    Pain Score: Pre-procedure:  /10 Post-procedure:  /10   Pre-op Assessment:  Shelly Huynh is a 49 y.o. (year old), female patient, seen today for interventional treatment. She  has a past surgical history that includes Abdominal hysterectomy; Cesarean section; Hernia repair; Cardiac valuve replacement; Esophagogastroduodenoscopy (egd) with propofol (N/A, 10/07/2016); Breast excisional biopsy (Right, 1992); Breast biopsy (Left, 07/25/2017); Breast biopsy (Right, 10/22/2019); Breast biopsy (Right, 10/22/2019); Breast lumpectomy (Right, 11/20/2019); and Breast biopsy (Right, 11/20/2019). Shelly Huynh has a current medication list which includes the following prescription(s): aimovig, albuterol, amoxicillin, aspirin ec, biotin, candesartan, candesartan, carvedilol, carvedilol, cyclobenzaprine, diazepam, docusate sodium, duloxetine, ergocalciferol, fluticasone, gabapentin, hydrocortisone, iron polysaccharides, levalbuterol, levocetirizine, linaclotide, meclizine, minoxidil, pantoprazole, pravastatin, spironolactone, sucralfate, warfarin, and warfarin. Her primarily concern today is the No chief complaint on file.  Initial Vital Signs:  Pulse/HCG Rate:    Temp:   Resp:   BP:   SpO2:    BMI: Estimated body mass index is 42.62 kg/m as calculated from the following:   Height as of 12/11/19: 5' 2"  (1.575 m).   Weight as of 12/11/19: 233 lb (105.7 kg).  Risk Assessment: Allergies: Reviewed. [name] allergic to ivp dye [iodinated diagnostic agents]; sulfa antibiotics; banana; guaifenesin; hctz [hydrochlorothiazide]; losartan; povidone iodine; povidone-iodine; lisinopril; and mucinex [guaifenesin er].  Allergy Precautions: None required Coagulopathies: Reviewed. None identified.  Blood-thinner therapy: None at this time Active Infection(s): Reviewed. None identified. Shelly Huynh is afebrile  Site Confirmation: Shelly Huynh was asked to confirm the procedure and  laterality before marking the site Procedure checklist: Completed Consent: Before the procedure and under the influence of no sedative(s), amnesic(s), or anxiolytics, the patient was informed of the treatment options, risks and possible complications.  To fulfill our ethical and legal obligations, as recommended by the American Medical Association's Code of Ethics, I have informed the patient of my clinical impression; the nature and purpose of the treatment or procedure; the risks, benefits, and possible complications of the intervention; the alternatives, including doing nothing; the risk(s) and benefit(s) of the alternative treatment(s) or procedure(s); and the risk(s) and benefit(s) of doing nothing. The patient was provided information about the general risks and possible complications associated with the procedure. These may include, but are not limited to: failure to achieve desired goals, infection, bleeding, organ or nerve damage, allergic reactions, paralysis, and death. In addition, the patient was informed of those risks and complications associated to Spine-related procedures, such as failure to decrease pain; infection (i.e.: Meningitis, epidural or intraspinal abscess); bleeding (i.e.: epidural hematoma, subarachnoid hemorrhage, or any other type of intraspinal or peri-dural bleeding); organ or nerve damage (i.e.: Any type of peripheral nerve, nerve root, or spinal cord injury) with subsequent damage to sensory, motor, and/or autonomic systems, resulting in permanent pain, numbness, and/or weakness of one or several areas of the body; allergic reactions; (i.e.: anaphylactic reaction); and/or death. Furthermore, the patient was informed of those risks and complications associated with the medications. These include, but are not limited to: allergic reactions (i.e.: anaphylactic or anaphylactoid reaction(s)); adrenal axis suppression; blood sugar elevation that in diabetics may result in  ketoacidosis or comma; water retention that in patients with history of congestive heart failure may result in shortness of breath, pulmonary edema, and decompensation with resultant heart failure; weight gain; swelling or edema; medication-induced neural toxicity; particulate matter embolism and blood vessel occlusion with resultant organ, and/or nervous system infarction; and/or aseptic necrosis of one or more joints. Finally, the patient was informed that Medicine is not an exact science; therefore, there is also the possibility of unforeseen or unpredictable risks and/or possible complications that may result in a catastrophic outcome. The patient indicated having understood very clearly. We have given the patient no guarantees and we have made no promises. Enough time was given to the patient to ask questions, all of which were answered to the patient's satisfaction. Shelly Huynh has indicated that she wanted to continue with the procedure. Attestation: I, the ordering provider, attest that I have discussed with the patient the benefits, risks, side-effects, alternatives, likelihood of achieving goals, and potential problems during recovery for the procedure that I have provided informed consent. Date  Time: {CHL [facility]-PAIN TIME CHOICES:[id number]}  Pre-Procedure Preparation:  Monitoring: As per clinic protocol. Respiration, ETCO2, SpO2, BP, heart rate and rhythm monitor placed and checked for adequate function Safety Precautions: Patient was assessed for positional comfort and pressure points before starting the procedure. Time-out: I initiated and conducted the "Time-out" before starting the procedure, as per protocol. The patient was asked to participate by confirming the accuracy of the "Time Out" information. Verification of the correct person, site, and procedure were performed and confirmed by me, the nursing staff, and the patient. "Time-out" conducted as per Joint Commission's Universal Protocol  (UP.01.01.01). Time:    Description of Procedure:          Laterality: Bilateral. The procedure was performed in identical fashion on both sides. Levels:  L2, L3, L4, L5, & S1 Medial Branch Level(s) Area Prepped: Posterior Lumbosacral Region Prepping solution: DuraPrep (Iodine Povacrylex [0.7% available iodine] and Isopropyl Alcohol, 74% w/w) Safety Precautions: Aspiration looking for blood return was conducted prior to all injections. At no point did we inject any substances, as  a needle was being advanced. Before injecting, the patient was told to immediately notify me if she was experiencing any new onset of "ringing in the ears, or metallic taste in the mouth". No attempts were made at seeking any paresthesias. Safe injection practices and needle disposal techniques used. Medications properly checked for expiration dates. SDV (single dose vial) medications used. After the completion of the procedure, all disposable equipment used was discarded in the proper designated medical waste containers. Local Anesthesia: Protocol guidelines were followed. The patient was positioned over the fluoroscopy table. The area was prepped in the usual manner. The time-out was completed. The target area was identified using fluoroscopy. A 12-in long, straight, sterile hemostat was used with fluoroscopic guidance to locate the targets for each level blocked. Once located, the skin was marked with an approved surgical skin marker. Once all sites were marked, the skin (epidermis, dermis, and hypodermis), as well as deeper tissues (fat, connective tissue and muscle) were infiltrated with a small amount of a short-acting local anesthetic, loaded on a 10cc syringe with a 25G, 1.5-in  Needle. An appropriate amount of time was allowed for local anesthetics to take effect before proceeding to the next step. Local Anesthetic: Lidocaine 2.0% The unused portion of the local anesthetic was discarded in the proper designated  containers. Technical explanation of process:  L2 Medial Branch Nerve Block (MBB): The target area for the L2 medial branch is at the junction of the postero-lateral aspect of the superior articular process and the superior, posterior, and medial edge of the transverse process of L3. Under fluoroscopic guidance, a Quincke needle was inserted until contact was made with os over the superior postero-lateral aspect of the pedicular shadow (target area). After negative aspiration for blood, 0.5 mL of the nerve block solution was injected without difficulty or complication. The needle was removed intact. L3 Medial Branch Nerve Block (MBB): The target area for the L3 medial branch is at the junction of the postero-lateral aspect of the superior articular process and the superior, posterior, and medial edge of the transverse process of L4. Under fluoroscopic guidance, a Quincke needle was inserted until contact was made with os over the superior postero-lateral aspect of the pedicular shadow (target area). After negative aspiration for blood, 0.5 mL of the nerve block solution was injected without difficulty or complication. The needle was removed intact. L4 Medial Branch Nerve Block (MBB): The target area for the L4 medial branch is at the junction of the postero-lateral aspect of the superior articular process and the superior, posterior, and medial edge of the transverse process of L5. Under fluoroscopic guidance, a Quincke needle was inserted until contact was made with os over the superior postero-lateral aspect of the pedicular shadow (target area). After negative aspiration for blood, 0.5 mL of the nerve block solution was injected without difficulty or complication. The needle was removed intact. L5 Medial Branch Nerve Block (MBB): The target area for the L5 medial branch is at the junction of the postero-lateral aspect of the superior articular process and the superior, posterior, and medial edge of the  sacral ala. Under fluoroscopic guidance, a Quincke needle was inserted until contact was made with os over the superior postero-lateral aspect of the pedicular shadow (target area). After negative aspiration for blood, 0.5 mL of the nerve block solution was injected without difficulty or complication. The needle was removed intact. S1 Medial Branch Nerve Block (MBB): The target area for the S1 medial branch is at the posterior and  inferior 6 o'clock position of the L5-S1 facet joint. Under fluoroscopic guidance, the Quincke needle inserted for the L5 MBB was redirected until contact was made with os over the inferior and postero aspect of the sacrum, at the 6 o' clock position under the L5-S1 facet joint (Target area). After negative aspiration for blood, 0.5 mL of the nerve block solution was injected without difficulty or complication. The needle was removed intact.  Nerve block solution: 0.2% PF-Ropivacaine + Triamcinolone (40 mg/mL) diluted to a final concentration of 4 mg of Triamcinolone/mL of Ropivacaine The unused portion of the solution was discarded in the proper designated containers. Procedural Needles: 22-gauge, 3.5-inch, Quincke needles used for all levels.  Once the entire procedure was completed, the treated area was cleaned, making sure to leave some of the prepping solution back to take advantage of its long term bactericidal properties.   Illustration of the posterior view of the lumbar spine and the posterior neural structures. Laminae of L2 through S1 are labeled. DPRL5, dorsal primary ramus of L5; DPRS1, dorsal primary ramus of S1; DPR3, dorsal primary ramus of L3; FJ, facet (zygapophyseal) joint L3-L4; I, inferior articular process of L4; LB1, lateral branch of dorsal primary ramus of L1; IAB, inferior articular branches from L3 medial branch (supplies L4-L5 facet joint); IBP, intermediate branch plexus; MB3, medial branch of dorsal primary ramus of L3; NR3, third lumbar nerve root;  S, superior articular process of L5; SAB, superior articular branches from L4 (supplies L4-5 facet joint also); TP3, transverse process of L3.  There were no vitals filed for this visit.   Start Time:   hrs. End Time:   hrs.  Imaging Guidance (Spinal):          Type of Imaging Technique: Fluoroscopy Guidance (Spinal) Indication(s): Assistance in needle guidance and placement for procedures requiring needle placement in or near specific anatomical locations not easily accessible without such assistance. Exposure Time: Please see nurses notes. Contrast: None used. Fluoroscopic Guidance: I was personally present during the use of fluoroscopy. "Tunnel Vision Technique" used to obtain the best possible view of the target area. Parallax error corrected before commencing the procedure. "Direction-depth-direction" technique used to introduce the needle under continuous pulsed fluoroscopy. Once target was reached, antero-posterior, oblique, and lateral fluoroscopic projection used confirm needle placement in all planes. Images permanently stored in EMR. Interpretation: No contrast injected. I personally interpreted the imaging intraoperatively. Adequate needle placement confirmed in multiple planes. Permanent images saved into the patient's record.  Antibiotic Prophylaxis:   Anti-infectives (From admission, onward)   None     Indication(s): None identified  Post-operative Assessment:  Post-procedure Vital Signs:  Pulse/HCG Rate:    Temp:   Resp:   BP:   SpO2:    EBL: None  Complications: No immediate post-treatment complications observed by team, or reported by patient.  Note: The patient tolerated the entire procedure well. A repeat set of vitals were taken after the procedure and the patient was kept under observation following institutional policy, for this type of procedure. Post-procedural neurological assessment was performed, showing return to baseline, prior to discharge. The  patient was provided with post-procedure discharge instructions, including a section on how to identify potential problems. Should any problems arise concerning this procedure, the patient was given instructions to immediately contact us, at any time, without hesitation. In any case, we plan to contact the patient by telephone for a follow-up status report regarding this interventional procedure.  Comments:  No additional relevant information.  Plan  of Care  Orders:  No orders of the defined types were placed in this encounter.  Chronic Opioid Analgesic:  Tramadol 50 mg 1 tablet p.o. every 6 hours Highest recorded MME/day: 30 mg/day MME/day: 20 mg/day   Medications ordered for procedure: No orders of the defined types were placed in this encounter.  Medications administered: Shelly Huynh had no medications administered during this visit.  See the medical record for exact dosing, route, and time of administration.  Follow-up plan:   No follow-ups on file.       Interventional management options: Planned, scheduled, and/or pending:   NOTE: COUMADIN ANTICOAGULATION (Lovenox bridge therapy by Dr. Nehemiah Huynh) Diagnostic bilateral lumbar facet block #1    Considering:   Diagnostic bilateral lumbar facet block  Diagnostic L4-5 LESI  Diagnostic bilateral L5 TFESI    PRN Procedures:   None at this time     Recent Visits Date Type Provider Dept  12/30/19 Office Visit Milinda Pointer, [facility]  12/16/19 Office Visit Milinda Pointer, MD [facility]  Showing recent visits within past 90 days and meeting all other requirements   Today's Visits Date Type Provider Dept  01/07/20 Appointment Milinda Pointer, MD [facility]  Showing today's visits and meeting all other requirements   Future Appointments No visits were found meeting these conditions.  Showing future appointments within next 90 days and meeting all other requirements    Disposition: Discharge home  Discharge (Date  Time): 01/07/2020;   hrs.   Primary Care Physician: Tracie Harrier, MD Location: [facility] Note by: Gaspar Cola, MD Date: 01/07/2020; Time: 7:01 AM  Disclaimer:  Medicine is not an Chief Strategy Officer. The only guarantee in medicine is that nothing is guaranteed. It is important to note that the decision to proceed with this intervention was based on the information collected from the patient. The Data and conclusions were drawn from the patient's questionnaire, the interview, and the physical examination. Because the information was provided in large part by the patient, it cannot be guaranteed that it has not been purposely or unconsciously manipulated. Every effort has been made to obtain as much relevant data as possible for this evaluation. It is important to note that the conclusions that lead to this procedure are derived in large part from the available data. Always take into account that the treatment will also be dependent on availability of resources and existing treatment guidelines, considered by other Pain Management Practitioners as being common knowledge and practice, at the time of the intervention. For Medico-Legal purposes, it is also important to point out that variation in procedural techniques and pharmacological choices are the acceptable norm. The indications, contraindications, technique, and results of the above procedure should only be interpreted and judged by a Board-Certified Interventional Pain Specialist with extensive familiarity and expertise in the same exact procedure and technique.

## 2020-01-07 NOTE — Telephone Encounter (Signed)
She called back and said Dr. Ferd Glassing cardiologist, needs for [place] to fax them the clearance request and information of procedure before he will agree to the Lovenox coumadin bridge thing.

## 2020-01-07 NOTE — Telephone Encounter (Signed)
Spoke with Shelly Huynh, apologized for confusion.  She was not approved for procedure with cessation of Coumadin by Dr Nehemiah Massed.  We did reach out to see if he would prescribe a lovenox bridge.  That is two communications to Dr Fabiola Backer office.  [name] going to call today and see if they will give her clearance and prescribe bridge therapy for procedure.

## 2020-01-16 ENCOUNTER — Other Ambulatory Visit: Payer: Self-pay

## 2020-01-16 ENCOUNTER — Ambulatory Visit (HOSPITAL_BASED_OUTPATIENT_CLINIC_OR_DEPARTMENT_OTHER): Payer: Managed Care, Other (non HMO) | Admitting: Pain Medicine

## 2020-01-16 ENCOUNTER — Encounter: Payer: Self-pay | Admitting: Pain Medicine

## 2020-01-16 ENCOUNTER — Ambulatory Visit
Admission: RE | Admit: 2020-01-16 | Discharge: 2020-01-16 | Disposition: A | Discharge status: Home / Self Care | Payer: Managed Care, Other (non HMO) | Source: Ambulatory Visit | Attending: Pain Medicine | Admitting: Pain Medicine

## 2020-01-16 ENCOUNTER — Telehealth: Payer: Self-pay | Admitting: Pain Medicine

## 2020-01-16 VITALS — BP 108/70 | HR 67 | Temp 97.3°F | Resp 20 | Ht 62.0 in | Wt 227.0 lb

## 2020-01-16 DIAGNOSIS — M545 Low back pain: Secondary | ICD-10-CM | POA: Diagnosis present

## 2020-01-16 DIAGNOSIS — Z952 Presence of prosthetic heart valve: Secondary | ICD-10-CM | POA: Insufficient documentation

## 2020-01-16 DIAGNOSIS — Z91041 Radiographic dye allergy status: Secondary | ICD-10-CM | POA: Insufficient documentation

## 2020-01-16 DIAGNOSIS — G4733 Obstructive sleep apnea (adult) (pediatric): Secondary | ICD-10-CM | POA: Diagnosis present

## 2020-01-16 DIAGNOSIS — M5137 Other intervertebral disc degeneration, lumbosacral region: Secondary | ICD-10-CM | POA: Insufficient documentation

## 2020-01-16 DIAGNOSIS — Z79899 Other long term (current) drug therapy: Secondary | ICD-10-CM

## 2020-01-16 DIAGNOSIS — Z79891 Long term (current) use of opiate analgesic: Secondary | ICD-10-CM | POA: Insufficient documentation

## 2020-01-16 DIAGNOSIS — Z6841 Body Mass Index (BMI) 40.0 and over, adult: Secondary | ICD-10-CM | POA: Diagnosis present

## 2020-01-16 DIAGNOSIS — G894 Chronic pain syndrome: Secondary | ICD-10-CM | POA: Insufficient documentation

## 2020-01-16 DIAGNOSIS — M792 Neuralgia and neuritis, unspecified: Secondary | ICD-10-CM | POA: Insufficient documentation

## 2020-01-16 DIAGNOSIS — Z888 Allergy status to other drugs, medicaments and biological substances status: Secondary | ICD-10-CM

## 2020-01-16 DIAGNOSIS — Z7901 Long term (current) use of anticoagulants: Secondary | ICD-10-CM | POA: Diagnosis present

## 2020-01-16 DIAGNOSIS — G8929 Other chronic pain: Secondary | ICD-10-CM

## 2020-01-16 DIAGNOSIS — M47816 Spondylosis without myelopathy or radiculopathy, lumbar region: Secondary | ICD-10-CM

## 2020-01-16 MED ORDER — MIDAZOLAM HCL 5 MG/5ML IJ SOLN
1.0000 mg | INTRAMUSCULAR | Status: DC | PRN
  Administered 2020-01-16: 10:00:00 3 mg via INTRAVENOUS
  Filled 2020-01-16: qty 5

## 2020-01-16 MED ORDER — CEFAZOLIN SODIUM 1 G IJ SOLR
INTRAMUSCULAR | Status: AC
  Filled 2020-01-16: qty 10

## 2020-01-16 MED ORDER — FENTANYL CITRATE (PF) 100 MCG/2ML IJ SOLN
25.0000 ug | INTRAMUSCULAR | Status: DC | PRN
  Administered 2020-01-16: 10:00:00 100 ug via INTRAVENOUS
  Filled 2020-01-16: qty 2

## 2020-01-16 MED ORDER — GABAPENTIN 300 MG PO CAPS: 300.0000 mg | ORAL_CAPSULE | Freq: Every day | ORAL | 0 refills | Status: DC

## 2020-01-16 MED ORDER — TRAMADOL HCL 50 MG PO TABS: 50.0000 mg | ORAL_TABLET | Freq: Four times a day (QID) | ORAL | 0 refills | Status: DC | PRN

## 2020-01-16 MED ORDER — EPHEDRINE SULFATE 50 MG/ML IJ SOLN
INTRAMUSCULAR | Status: AC
  Filled 2020-01-16: qty 1

## 2020-01-16 MED ORDER — TRIAMCINOLONE ACETONIDE 40 MG/ML IJ SUSP
80.0000 mg | Freq: Once | INTRAMUSCULAR | Status: AC
  Administered 2020-01-16: 09:00:00 80 mg
  Filled 2020-01-16: qty 2

## 2020-01-16 MED ORDER — CEFAZOLIN SODIUM-DEXTROSE 1-4 GM/50ML-% IV SOLN
1.0000 g | Freq: Once | INTRAVENOUS | Status: AC
  Administered 2020-01-16: 09:00:00 1 g via INTRAVENOUS

## 2020-01-16 MED ORDER — TRAMADOL HCL 50 MG PO TABS: 50.0000 mg | ORAL_TABLET | Freq: Four times a day (QID) | ORAL | 0 refills | Status: DC

## 2020-01-16 MED ORDER — ROPIVACAINE HCL 2 MG/ML IJ SOLN
18.0000 mL | Freq: Once | INTRAMUSCULAR | Status: AC
  Administered 2020-01-16: 09:00:00 18 mL via PERINEURAL
  Filled 2020-01-16: qty 20

## 2020-01-16 MED ORDER — LACTATED RINGERS IV SOLN
1000.0000 mL | Freq: Once | INTRAVENOUS | Status: AC
  Administered 2020-01-16: 09:00:00 1000 mL via INTRAVENOUS

## 2020-01-16 MED ORDER — LIDOCAINE HCL 2 % IJ SOLN
20.0000 mL | Freq: Once | INTRAMUSCULAR | Status: AC
  Administered 2020-01-16: 09:00:00 400 mg
  Filled 2020-01-16: qty 40

## 2020-01-16 NOTE — Patient Instructions (Signed)
____________________________________________________________________________________________  Post-Procedure Discharge Instructions  Instructions:  Apply ice:   Purpose: This will minimize any swelling and discomfort after procedure.   When: Day of procedure, as soon as you get home.  How: Fill a plastic sandwich bag with crushed ice. Cover it with a small towel and apply to injection site.  How long: (15 min on, 15 min off) Apply for 15 minutes then remove x 15 minutes.  Repeat sequence on day of procedure, until you go to bed.  Apply heat:   Purpose: To treat any soreness and discomfort from the procedure.  When: Starting the next day after the procedure.  How: Apply heat to procedure site starting the day following the procedure.  How long: May continue to repeat daily, until discomfort goes away.  Food intake: Start with clear liquids (like water) and advance to regular food, as tolerated.   Physical activities: Keep activities to a minimum for the first 8 hours after the procedure. After that, then as tolerated.  Driving: If you have received any sedation, be responsible and do not drive. You are not allowed to drive for 24 hours after having sedation.  Blood thinner: (Applies only to those taking blood thinners) You may restart your blood thinner 6 hours after your procedure.  Insulin: (Applies only to Diabetic patients taking insulin) As soon as you can eat, you may resume your normal dosing schedule.  Infection prevention: Keep procedure site clean and dry. Shower daily and clean area with soap and water.  Post-procedure Pain Diary: Extremely important that this be done correctly and accurately. Recorded information will be used to determine the next step in treatment. For the purpose of accuracy, follow these rules:  Evaluate only the area treated. Do not report or include pain from an untreated area. For the purpose of this evaluation, ignore all other areas of pain,  except for the treated area.  After your procedure, avoid taking a long nap and attempting to complete the pain diary after you wake up. Instead, set your alarm clock to go off every hour, on the hour, for the initial 8 hours after the procedure. Document the duration of the numbing medicine, and the relief you are getting from it.  Do not go to sleep and attempt to complete it later. It will not be accurate. If you received sedation, it is likely that you were given a medication that may cause amnesia. Because of this, completing the diary at a later time may cause the information to be inaccurate. This information is needed to plan your care.  Follow-up appointment: Keep your post-procedure follow-up evaluation appointment after the procedure (usually 2 weeks for most procedures, 6 weeks for radiofrequencies). DO NOT FORGET to bring you pain diary with you.   Expect: (What should I expect to see with my procedure?)  From numbing medicine (AKA: Local Anesthetics): Numbness or decrease in pain. You may also experience some weakness, which if present, could last for the duration of the local anesthetic.  Onset: Full effect within 15 minutes of injected.  Duration: It will depend on the type of local anesthetic used. On the average, 1 to 8 hours.   From steroids (Applies only if steroids were used): Decrease in swelling or inflammation. Once inflammation is improved, relief of the pain will follow.  Onset of benefits: Depends on the amount of swelling present. The more swelling, the longer it will take for the benefits to be seen. In some cases, up to 10 days.  Duration: Steroids will stay in the system x 2 weeks. Duration of benefits will depend on multiple posibilities including persistent irritating factors.  Side-effects: If present, they may typically last 2 weeks (the duration of the steroids).  Frequent: Cramps (if they occur, drink Gatorade and take over-the-counter Magnesium 450-500 mg  once to twice a day); water retention with temporary weight gain; increases in blood sugar; decreased immune system response; increased appetite.  Occasional: Facial flushing (red, warm cheeks); mood swings; menstrual changes.  Uncommon: Long-term decrease or suppression of natural hormones; bone thinning. (These are more common with higher doses or more frequent use. This is why we prefer that our patients avoid having any injection therapies in other practices.)   Very Rare: Severe mood changes; psychosis; aseptic necrosis.  From procedure: Some discomfort is to be expected once the numbing medicine wears off. This should be minimal if ice and heat are applied as instructed.  Call if: (When should I call?)  You experience numbness and weakness that gets worse with time, as opposed to wearing off.  New onset bowel or bladder incontinence. (Applies only to procedures done in the spine)  Emergency Numbers:  Durning business hours (Monday - Thursday, 8:00 AM - 4:00 PM) (Friday, 9:00 AM - 12:00 Noon): [phone]  After hours: [phone]  NOTE: If you are having a problem and are unable connect with, or to talk to a provider, then go to your nearest urgent care or emergency department. If the problem is serious and urgent, please call 911. ____________________________________________________________________________________________   ____________________________________________________________________________________________  Blood Thinners  IMPORTANT NOTICE:  If you take any of these, make sure to notify the nursing staff.  Failure to do so may result in injury.  Recommended time intervals to stop and restart blood-thinners, before & after invasive procedures  Generic Name Brand Name Stop Time. Must be stopped at least this long before procedures. After procedures, wait at least this long before re-starting.  Abciximab Reopro 15 days 2 hrs  Alteplase Activase 10 days 10 days   Anagrelide Agrylin    Apixaban Eliquis 3 days 6 hrs  Cilostazol Pletal 3 days 5 hrs  Clopidogrel Plavix 7-10 days 2 hrs  Dabigatran Pradaxa 5 days 6 hrs  Dalteparin Fragmin 24 hours 4 hrs  Dipyridamole Aggrenox 11days 2 hrs  Edoxaban Lixiana; Savaysa 3 days 2 hrs  Enoxaparin  Lovenox 24 hours 4 hrs  Eptifibatide Integrillin 8 hours 2 hrs  Fondaparinux  Arixtra 72 hours 12 hrs  Prasugrel Effient 7-10 days 6 hrs  Reteplase Retavase 10 days 10 days  Rivaroxaban Xarelto 3 days 6 hrs  Ticagrelor Brilinta 5-7 days 6 hrs  Ticlopidine Ticlid 10-14 days 2 hrs  Tinzaparin Innohep 24 hours 4 hrs  Tirofiban Aggrastat 8 hours 2 hrs  Warfarin Coumadin 5 days 2 hrs   Other medications with blood-thinning effects  Product indications Generic (Brand) names Note  Cholesterol Lipitor Stop 4 days before procedure  Blood thinner (injectable) Heparin (LMW or LMWH Heparin) Stop 24 hours before procedure  Cancer Ibrutinib (Imbruvica) Stop 7 days before procedure  Malaria/Rheumatoid Hydroxychloroquine (Plaquenil) Stop 11 days before procedure  Thrombolytics  10 days before or after procedures   Over-the-counter (OTC) Products with blood-thinning effects  Product Common names Stop Time  Aspirin > 325 mg Goody Powders, Excedrin, etc. 11 days  Aspirin ? 81 mg  7 days  Fish oil  4 days  Garlic supplements  7 days  Ginkgo biloba  36 hours  Ginseng  24 hours  NSAIDs Ibuprofen, Naprosyn, etc. 3 days  Vitamin E  4 days   ____________________________________________________________________________________________  ____________________________________________________________________________________________  Medication Rules  Purpose: To inform patients, and their family members, of our rules and regulations.  Applies to: All patients receiving prescriptions (written or electronic).  Pharmacy of record: Pharmacy where electronic prescriptions will be sent. If written prescriptions are taken to a  different pharmacy, please inform the nursing staff. The pharmacy listed in the electronic medical record should be the one where you would like electronic prescriptions to be sent.  Electronic prescriptions: In compliance with the [place] (STOP) Act of 2017 (Session Lanny Cramp [phone]), effective November 21, 2018, all controlled substances must be electronically prescribed. Calling prescriptions to the pharmacy will cease to exist.  Prescription refills: Only during scheduled appointments. Applies to all prescriptions.  NOTE: The following applies primarily to controlled substances (Opioid* Pain Medications).   Patient's responsibilities: 1. Pain Pills: Bring all pain pills to every appointment (except for procedure appointments). 2. Pill Bottles: Bring pills in original pharmacy bottle. Always bring the newest bottle. Bring bottle, even if empty. 3. Medication refills: You are responsible for knowing and keeping track of what medications you take and those you need refilled. The day before your appointment: write a list of all prescriptions that need to be refilled. The day of the appointment: give the list to the admitting nurse. Prescriptions will be written only during appointments. No prescriptions will be written on procedure days. If you forget a medication: it will not be "Called in", "Faxed", or "electronically sent". You will need to get another appointment to get these prescribed. No early refills. Do not call asking to have your prescription filled early. 4. Prescription Accuracy: You are responsible for carefully inspecting your prescriptions before leaving our office. Have the discharge nurse carefully go over each prescription with you, before taking them home. Make sure that your name is accurately spelled, that your address is correct. Check the name and dose of your medication to make sure it is accurate. Check the number of pills, and the  written instructions to make sure they are clear and accurate. Make sure that you are given enough medication to last until your next medication refill appointment. 5. Taking Medication: Take medication as prescribed. When it comes to controlled substances, taking less pills or less frequently than prescribed is permitted and encouraged. Never take more pills than instructed. Never take medication more frequently than prescribed.  6. Inform other Doctors: Always inform, all of your healthcare providers, of all the medications you take. 7. Pain Medication from other Providers: You are not allowed to accept any additional pain medication from any other Doctor or Healthcare provider. There are two exceptions to this rule. (see below) In the event that you require additional pain medication, you are responsible for notifying us, as stated below. 8. Medication Agreement: You are responsible for carefully reading and following our Medication Agreement. This must be signed before receiving any prescriptions from our practice. Safely store a copy of your signed Agreement. Violations to the Agreement will result in no further prescriptions. (Additional copies of our Medication Agreement are available upon request.) 9. Laws, Rules, & Regulations: All patients are expected to follow all Federal and [employer], [employer], Rules, [employer] [facility] [employer]. Ignorance of the Laws does not constitute a valid excuse.  10. Illegal drugs and Controlled Substances: The use of illegal substances (including, but not limited to marijuana and its derivatives) and/or the  illegal use of any controlled substances is strictly prohibited. Violation of this rule may result in the immediate and permanent discontinuation of any and all prescriptions being written by our practice. The use of any illegal substances is prohibited. 11. Adopted CDC guidelines & recommendations: Target dosing levels will be at or below 60 MME/day. Use of benzodiazepines**  is not recommended.  Exceptions: There are only two exceptions to the rule of not receiving pain medications from other Healthcare Providers. 1. Exception #1 (Emergencies): In the event of an emergency (i.e.: accident requiring emergency care), you are allowed to receive additional pain medication. However, you are responsible for: As soon as you are able, call our office [phone], at any time of the day or night, and leave a message stating your name, the date and nature of the emergency, and the name and dose of the medication prescribed. In the event that your call is answered by a member of our staff, make sure to document and save the date, time, and the name of the person that took your information.  2. Exception #2 (Planned Surgery): In the event that you are scheduled by another doctor or dentist to have any type of surgery or procedure, you are allowed (for a period no longer than 30 days), to receive additional pain medication, for the acute post-op pain. However, in this case, you are responsible for picking up a copy of our "Post-op Pain Management for Surgeons" handout, and giving it to your surgeon or dentist. This document is available at our office, and does not require an appointment to obtain it. Simply go to our office during business hours (Monday-Thursday from 8:00 AM to 4:00 PM) (Friday 8:00 AM to 12:00 Noon) or if you have a scheduled appointment with [place], prior to your surgery, and ask for it by name. In addition, you will need to provide [place] with your name, name of your surgeon, type of surgery, and date of procedure or surgery.  *Opioid medications include: morphine, codeine, oxycodone, oxymorphone, hydrocodone, hydromorphone, meperidine, tramadol, tapentadol, buprenorphine, fentanyl, methadone. **Benzodiazepine medications include: diazepam (Valium), alprazolam (Xanax), clonazepam (Klonopine), lorazepam (Ativan), clorazepate (Tranxene), chlordiazepoxide (Librium), estazolam  (Prosom), oxazepam (Serax), temazepam (Restoril), triazolam (Halcion) (Last updated: 01/18/2018) ____________________________________________________________________________________________   ____________________________________________________________________________________________  Medication Recommendations and Reminders  Applies to: All patients receiving prescriptions (written and/or electronic).  Medication Rules & Regulations: These rules and regulations exist for your safety and that of others. They are not flexible and neither are we. Dismissing or ignoring them will be considered "non-compliance" with medication therapy, resulting in complete and irreversible termination of such therapy. (See document titled "Medication Rules" for more details.) In all conscience, because of safety reasons, we cannot continue providing a therapy where the patient does not follow instructions.  Pharmacy of record:   Definition: This is the pharmacy where your electronic prescriptions will be sent.   We do not endorse any particular pharmacy.  You are not restricted in your choice of pharmacy.  The pharmacy listed in the electronic medical record should be the one where you want electronic prescriptions to be sent.  If you choose to change pharmacy, simply notify our nursing staff of your choice of new pharmacy.  Recommendations:  Keep all of your pain medications in a safe place, under lock and key, even if you live alone.   After you fill your prescription, take 1 week's worth of pills and put them away in a safe place. You should keep a separate, properly labeled  bottle for this purpose. The remainder should be kept in the original bottle. Use this as your primary supply, until it runs out. Once it's gone, then you know that you have 1 week's worth of medicine, and it is time to come in for a prescription refill. If you do this correctly, it is unlikely that you will ever run out of  medicine.  To make sure that the above recommendation works, it is very important that you make sure your medication refill appointments are scheduled at least 1 week before you run out of medicine. To do this in an effective manner, make sure that you do not leave the office without scheduling your next medication management appointment. Always ask the nursing staff to show you in your prescription , when your medication will be running out. Then arrange for the receptionist to get you a return appointment, at least 7 days before you run out of medicine. Do not wait until you have 1 or 2 pills left, to come in. This is very poor planning and does not take into consideration that we may need to cancel appointments due to bad weather, sickness, or emergencies affecting our staff.  "Partial Fill": If for any reason your pharmacy does not have enough pills/tablets to completely fill or refill your prescription, do not allow for a "partial fill". You will need a separate prescription to fill the remaining amount, which we will not provide. If the reason for the partial fill is your insurance, you will need to talk to the pharmacist about payment alternatives for the remaining tablets, but again, do not accept a partial fill.  Prescription refills and/or changes in medication(s):   Prescription refills, and/or changes in dose or medication, will be conducted only during scheduled medication management appointments. (Applies to both, written and electronic prescriptions.)  No refills on procedure days. No medication will be changed or started on procedure days. No changes, adjustments, and/or refills will be conducted on a procedure day. Doing so will interfere with the diagnostic portion of the procedure.  No phone refills. No medications will be "called into the pharmacy".  No Fax refills.  No weekend refills.  No Holliday refills.  No after hours refills.  Remember:  Business hours are:  Monday  to Thursday 8:00 AM to 4:00 PM Provider's Schedule: Milinda Pointer, MD - Appointments are:  Medication management: Monday and Wednesday 8:00 AM to 4:00 PM Procedure day: Tuesday and Thursday 7:30 AM to 4:00 PM Gillis Santa, MD - Appointments are:  Medication management: Tuesday and Thursday 8:00 AM to 4:00 PM Procedure day: Monday and Wednesday 7:30 AM to 4:00 PM (Last update: 01/18/2018) ____________________________________________________________________________________________   ____________________________________________________________________________________________  CANNABIDIOL (AKA: CBD Oil or Pills)  Applies to: All patients receiving prescriptions of controlled substances (written and/or electronic).  General Information: Cannabidiol (CBD) was discovered in 24. It is one of some 113 identified cannabinoids in cannabis (Marijuana) plants, accounting for up to 40% of the plant's extract. As of 2018, preliminary clinical research on cannabidiol included studies of anxiety, cognition, movement disorders, and pain.  Cannabidiol is consummed in multiple ways, including inhalation of cannabis smoke or vapor, as an aerosol spray into the cheek, and by mouth. It may be supplied as CBD oil containing CBD as the active ingredient (no added tetrahydrocannabinol (THC) or terpenes), a full-plant CBD-dominant hemp extract oil, capsules, dried cannabis, or as a liquid solution. CBD is thought not have the same psychoactivity as THC, and may affect the actions of  THC. Studies suggest that CBD may interact with different biological targets, including cannabinoid receptors and other neurotransmitter receptors. As of 2018 the mechanism of action for its biological effects has not been determined.  In the [place], cannabidiol has a limited approval by the Food and Drug Administration (FDA) for treatment of only two types of epilepsy disorders. The side effects of long-term use of the drug  include somnolence, decreased appetite, diarrhea, fatigue, malaise, weakness, sleeping problems, and others.  CBD remains a Schedule I drug prohibited for any use.  Legality: Some manufacturers ship CBD products nationally, an illegal action which the FDA has not enforced in 2018, with CBD remaining the subject of an FDA investigational new drug evaluation, and is not considered legal as a dietary supplement or food ingredient as of December 2018. Federal illegality has made it difficult historically to conduct research on CBD. CBD is openly sold in head shops and health food stores in some states where such sales have not been explicitly legalized.  Warning: Because it is not FDA approved for general use or treatment of pain, it is not required to undergo the same manufacturing controls as prescription drugs.  This means that the available cannabidiol (CBD) may be contaminated with THC.  If this is the case, it will trigger a positive urine drug screen (UDS) test for cannabinoids (Marijuana).  Because a positive UDS for illicit substances is a violation of our medication agreement, your opioid analgesics (pain medicine) may be permanently discontinued. (Last update: 02/08/2018) ____________________________________________________________________________________________    ______________________________________________________________________________________________  Specialty Pain Scale  Introduction:  There are significant differences in how pain is reported. The word pain usually refers to physical pain, but it is also a common synonym of suffering. The medical community uses a scale from 0 (zero) to 10 (ten) to report pain level. Zero (0) is described as "no pain", while ten (10) is described as "the worse pain you can imagine". The problem with this scale is that physical pain is reported along with suffering. Suffering refers to mental pain, or more often yet it refers to any unpleasant  feeling, emotion or aversion associated with the perception of harm or threat of harm. It is the psychological component of pain.  Pain Specialists prefer to separate the two components. The pain scale used by this practice is the Verbal Numerical Rating Scale (VNRS-11). This scale is for the physical pain only. DO NOT INCLUDE how your pain psychologically affects you. This scale is for adults 55 years of age and older. It has 11 (eleven) levels. The 1st level is 0/10. This means: "right now, I have no pain". In the context of pain management, it also means: "right now, my physical pain is under control with the current therapy".  General Information:  The scale should reflect your current level of pain. Unless you are specifically asked for the level of your worst pain, or your average pain. If you are asked for one of these two, then it should be understood that it is over the past 24 hours.  Levels 1 (one) through 5 (five) are described below, and can be treated as an outpatient. Ambulatory pain management facilities such as ours are more than adequate to treat these levels. Levels 6 (six) through 10 (ten) are also described below, however, these must be treated as a hospitalized patient. While levels 6 (six) and 7 (seven) may be evaluated at an urgent care facility, levels 8 (eight) through 10 (ten) constitute medical emergencies and  as such, they belong in a hospital's emergency department. When having these levels (as described below), do not come to our office. Our facility is not equipped to manage these levels. Go directly to an urgent care facility or an emergency department to be evaluated.  Definitions:  Activities of Daily Living (ADL): Activities of daily living (ADL or ADLs) is a term used in healthcare to refer to people's daily self-care activities. Health professionals often use a person's ability or inability to perform ADLs as a measurement of their functional status, particularly in  regard to people post injury, with disabilities and the elderly. There are two ADL levels: Basic and Instrumental. Basic Activities of Daily Living (BADL  or BADLs) consist of self-care tasks that include: Bathing and showering; personal hygiene and grooming (including brushing/combing/styling hair); dressing; Toilet hygiene (getting to the toilet, cleaning oneself, and getting back up); eating and self-feeding (not including cooking or chewing and swallowing); functional mobility, often referred to as "transferring", as measured by the ability to walk, get in and out of bed, and get into and out of a chair; the broader definition (moving from one place to another while performing activities) is useful for people with different physical abilities who are still able to get around independently. Basic ADLs include the things many people do when they get up in the morning and get ready to go out of the house: get out of bed, go to the toilet, bathe, dress, groom, and eat. On the average, loss of function typically follows a particular order. Hygiene is the first to go, followed by loss of toilet use and locomotion. The last to go is the ability to eat. When there is only one remaining area in which the person is independent, there is a 62.9% chance that it is eating and only a 3.5% chance that it is hygiene. Instrumental Activities of Daily Living (IADL or IADLs) are not necessary for fundamental functioning, but they let an individual live independently in a community. IADL consist of tasks that include: cleaning and maintaining the house; home establishment and maintenance; care of others (including selecting and supervising caregivers); care of pets; child rearing; managing money; managing financials (investments, etc.); meal preparation and cleanup; shopping for groceries and necessities; moving within the community; safety procedures and emergency responses; health management and maintenance (taking prescribed  medications); and using the telephone or other form of communication.  Instructions:  Most patients tend to report their pain as a combination of two factors, their physical pain and their psychosocial pain. This last one is also known as "suffering" and it is reflection of how physical pain affects you socially and psychologically. From now on, report them separately.  From this point on, when asked to report your pain level, report only your physical pain. Use the following table for reference.  Pain Clinic Pain Levels (0-5/10)  Pain Level Score  Description  No Pain 0   Mild pain 1 Nagging, annoying, but does not interfere with basic activities of daily living (ADL). Patients are able to eat, bathe, get dressed, toileting (being able to get on and off the toilet and perform personal hygiene functions), transfer (move in and out of bed or a chair without assistance), and maintain continence (able to control bladder and bowel functions). Blood pressure and heart rate are unaffected. A normal heart rate for a healthy adult ranges from 60 to 100 bpm (beats per minute).   Mild to moderate pain 2 Noticeable and distracting. Impossible to  hide from other people. More frequent flare-ups. Still possible to adapt and function close to normal. It can be very annoying and may have occasional stronger flare-ups. With discipline, patients may get used to it and adapt.   Moderate pain 3 Interferes significantly with activities of daily living (ADL). It becomes difficult to feed, bathe, get dressed, get on and off the toilet or to perform personal hygiene functions. Difficult to get in and out of bed or a chair without assistance. Very distracting. With effort, it can be ignored when deeply involved in activities.   Moderately severe pain 4 Impossible to ignore for more than a few minutes. With effort, patients may still be able to manage work or participate in some social activities. Very difficult to  concentrate. Signs of autonomic nervous system discharge are evident: dilated pupils (mydriasis); mild sweating (diaphoresis); sleep interference. Heart rate becomes elevated (>115 bpm). Diastolic blood pressure (lower number) rises above 100 mmHg. Patients find relief in laying down and not moving.   Severe pain 5 Intense and extremely unpleasant. Associated with frowning face and frequent crying. Pain overwhelms the senses.  Ability to do any activity or maintain social relationships becomes significantly limited. Conversation becomes difficult. Pacing back and forth is common, as getting into a comfortable position is nearly impossible. Pain wakes you up from deep sleep. Physical signs will be obvious: pupillary dilation; increased sweating; goosebumps; brisk reflexes; cold, clammy hands and feet; nausea, vomiting or dry heaves; loss of appetite; significant sleep disturbance with inability to fall asleep or to remain asleep. When persistent, significant weight loss is observed due to the complete loss of appetite and sleep deprivation.  Blood pressure and heart rate becomes significantly elevated. Caution: If elevated blood pressure triggers a pounding headache associated with blurred vision, then the patient should immediately seek attention at an urgent or emergency care unit, as these may be signs of an impending stroke.    Emergency Department Pain Levels (6-10/10)  Emergency Room Pain 6 Severely limiting. Requires emergency care and should not be seen or managed at an outpatient pain management facility. Communication becomes difficult and requires great effort. Assistance to reach the emergency department may be required. Facial flushing and profuse sweating along with potentially dangerous increases in heart rate and blood pressure will be evident.   Distressing pain 7 Self-care is very difficult. Assistance is required to transport, or use restroom. Assistance to reach the emergency department  will be required. Tasks requiring coordination, such as bathing and getting dressed become very difficult.   Disabling pain 8 Self-care is no longer possible. At this level, pain is disabling. The individual is unable to do even the most "basic" activities such as walking, eating, bathing, dressing, transferring to a bed, or toileting. Fine motor skills are lost. It is difficult to think clearly.   Incapacitating pain 9 Pain becomes incapacitating. Thought processing is no longer possible. Difficult to remember your own name. Control of movement and coordination are lost.   The worst pain imaginable 10 At this level, most patients pass out from pain. When this level is reached, collapse of the autonomic nervous system occurs, leading to a sudden drop in blood pressure and heart rate. This in turn results in a temporary and dramatic drop in blood flow to the brain, leading to a loss of consciousness. Fainting is one of the body's self defense mechanisms. Passing out puts the brain in a calmed state and causes it to shut down for a while, in  order to begin the healing process.    Summary: 1.   Refer to this scale when providing [place] with your pain level. 2.   Be accurate and careful when reporting your pain level. This will help with your care. 3.   Over-reporting your pain level will lead to loss of credibility. 4.   Even a level of 1/10 means that there is pain and will be treated at our facility. 5.   High, inaccurate reporting will be documented as "Symptom Exaggeration", leading to loss of credibility and suspicions of possible secondary gains such as obtaining more narcotics, or wanting to appear disabled, for fraudulent reasons. 6.   Only pain levels of 5 or below will be seen at our facility. 7.   Pain levels of 6 and above will be sent to the Emergency Department and the appointment  cancelled.  ______________________________________________________________________________________________    Tramadol tablets What is this medicine? TRAMADOL (TRA ma dole) is a pain reliever. It is used to treat moderate to severe pain in adults. This medicine may be used for other purposes; ask your health care provider or pharmacist if you have questions. COMMON BRAND NAME(S): Ultram What should I tell my health care provider before I take this medicine? They need to know if you have any of these conditions:  brain tumor  depression  drug abuse or addiction  head injury  if you frequently drink alcohol containing drinks  kidney disease or trouble passing urine  liver disease  lung disease, asthma, or breathing problems  seizures or epilepsy  suicidal thoughts, plans, or attempt; a previous suicide attempt by you or a family member  an unusual or allergic reaction to tramadol, codeine, other medicines, foods, dyes, or preservatives  pregnant or trying to get pregnant  breast-feeding How should I use this medicine? Take this medicine by mouth with a full glass of water. Follow the directions on the prescription label. You can take it with or without food. If it upsets your stomach, take it with food. Do not take your medicine more often than directed. A special MedGuide will be given to you by the pharmacist with each prescription and refill. Be sure to read this information carefully each time. Talk to your pediatrician regarding the use of this medicine in children. Special care may be needed. Overdosage: If you think you have taken too much of this medicine contact a poison control center or emergency room at once. NOTE: This medicine is only for you. Do not share this medicine with others. What if I miss a dose? If you miss a dose, take it as soon as you can. If it is almost time for your next dose, take only that dose. Do not take double or extra doses. What may  interact with this medicine? Do not take this medication with any of the following medicines:  MAOIs like Carbex, Eldepryl, Marplan, Nardil, and Parnate This medicine may also interact with the following medications:  alcohol  antihistamines for allergy, cough and cold  certain medicines for anxiety or sleep  certain medicines for depression like amitriptyline, fluoxetine, sertraline  certain medicines for migraine headache like almotriptan, eletriptan, frovatriptan, naratriptan, rizatriptan, sumatriptan, zolmitriptan  certain medicines for seizures like carbamazepine, oxcarbazepine, phenobarbital, primidone  certain medicines that treat or prevent blood clots like warfarin  digoxin  furazolidone  general anesthetics like halothane, isoflurane, methoxyflurane, propofol  linezolid  local anesthetics like lidocaine, pramoxine, tetracaine  medicines that relax muscles for surgery  other narcotic  medicines for pain or cough  phenothiazines like chlorpromazine, mesoridazine, prochlorperazine, thioridazine  procarbazine This list may not describe all possible interactions. Give your health care provider a list of all the medicines, herbs, non-prescription drugs, or dietary supplements you use. Also tell them if you smoke, drink alcohol, or use illegal drugs. Some items may interact with your medicine. What should I watch for while using this medicine? Tell your health care provider if your pain does not go away, if it gets worse, or if you have new or a different type of pain. You may develop tolerance to this drug. Tolerance means that you will need a higher dose of the drug for pain relief. Tolerance is normal and is expected if you take this drug for a long time. Do not suddenly stop taking your drug because you may develop a severe reaction. Your body becomes used to the drug. This does NOT mean you are addicted. Addiction is a behavior related to getting and using a drug for a  nonmedical reason. If you have pain, you have a medical reason to take pain drug. Your health care provider will tell you how much drug to take. If your health care provider wants you to stop the drug, the dose will be slowly lowered over time to avoid any side effects. If you take other drugs that also cause drowsiness like other narcotic pain drugs, benzodiazepines, or other drugs for sleep, you may have more side effects. Give your health care provider a list of all drugs you use. He or she will tell you how much drug to take. Do not take more drug than directed. Get emergency help right away if you have trouble breathing or are unusually tired or sleepy. Talk to your health care provider about naloxone and how to get it. Naloxone is an emergency drug used for an opioid overdose. An overdose can happen if you take too much opioid. It can also happen if an opioid is taken with some other drugs or substances, like alcohol. Know the symptoms of an overdose, like trouble breathing, unusually tired or sleepy, or not being able to respond or wake up. Make sure to tell caregivers and close contacts where it is stored. Make sure they know how to use it. After naloxone is given, you must get emergency help right away. Naloxone is a temporary treatment. Repeat doses may be needed. This drug may cause serious skin reactions. They can happen weeks to months after starting the drug. Contact your health care provider right away if you notice fevers or flu-like symptoms with a rash. The rash may be red or purple and then turn into blisters or peeling of the skin. Or, you might notice a red rash with swelling of the face, lips, or lymph nodes in your neck or under your arms. You may get drowsy or dizzy. Do not drive, use machinery, or do anything that needs mental alertness until you know how this drug affects you. Do not stand up or sit up quickly, especially if you are an older patient. This reduces the risk of dizzy or  fainting spells. Alcohol may interfere with the effect of this drug. Avoid alcoholic drinks. This drug will cause constipation. If you do not have a bowel movement for 3 days, call your health care provider. Your mouth may get dry. Chewing sugarless gum or sucking hard candy and drinking plenty of water may help. Contact your health care provider if the problem does not go away  or is severe. What side effects may I notice from receiving this medicine? Side effects that you should report to your doctor or health care professional as soon as possible:  allergic reactions like skin rash, itching or hives, swelling of the face, lips, or tongue  breathing problems  confusion  redness, blistering, peeling or loosening of the skin, including inside the mouth  seizures  signs and symptoms of low blood pressure like dizziness; feeling faint or lightheaded, falls; unusually weak or tired  trouble passing urine or change in the amount of urine Side effects that usually do not require medical attention (report to your doctor or health care professional if they continue or are bothersome):  constipation  dry mouth  nausea, vomiting  tiredness This list may not describe all possible side effects. Call your doctor for medical advice about side effects. You may report side effects to FDA at 1-800-FDA-1088. Where should I keep my medicine? Keep out of the reach of children. This medicine may cause accidental overdose and death if it taken by other adults, children, or pets. Mix any unused medicine with a substance like cat litter or coffee grounds. Then throw the medicine away in a sealed container like a sealed bag or a coffee can with a lid. Do not use the medicine after the expiration date. Store at room temperature between 15 and 30 degrees C (59 and 86 degrees F). NOTE: This sheet is a summary. It may not cover all possible information. If you have questions about this medicine, talk to your  doctor, pharmacist, or health care provider.  2020 [employer] (2019-06-17 13:08:25)

## 2020-01-16 NOTE — Telephone Encounter (Signed)
Patient states Dr. Dossie Arbour changed her medication and sent in to pharmacy, pharmacy states her insurnace will only cover 90 supply. Also she states she cannot take 62m of gabapentin a day. Please call to discuss changes and help patient get this straightened out.

## 2020-01-16 NOTE — Progress Notes (Addendum)
PROVIDER NOTE: Information contained herein reflects review and annotations entered in association with encounter. Interpretation of such information and data should be left to medically-trained personnel. Information provided to patient can be located elsewhere in the medical record under "Patient Instructions". Document created using STT-dictation technology, any transcriptional errors that may result from process are unintentional.    Patient: Shelly Huynh  Service Category: Procedure  Provider: Gaspar Cola, MD  DOB: 08-21-1971  DOS: 01/16/2020  Location: [facility]  MRN: [id number]  Setting: Ambulatory - outpatient  Referring Provider: Milinda Pointer, MD  Type: Established Patient  Specialty: Interventional Pain Management  PCP: Tracie Harrier, MD   Primary Reason for Visit: Interventional Pain Management Treatment. CC: Back Pain (lower)  Procedure:          Anesthesia, Analgesia, Anxiolysis:  Type: Lumbar Facet, Medial Branch Block(s) #1  Primary Purpose: Diagnostic Region: Posterolateral Lumbosacral Spine Level: L2, L3, L4, L5, & S1 Medial Branch Level(s). Injecting these levels blocks the L3-4, L4-5, and L5-S1 lumbar facet joints. Laterality: Bilateral  Type: Moderate (Conscious) Sedation combined with Local Anesthesia Indication(s): Analgesia and Anxiety Route: Intravenous (IV) IV Access: Secured Sedation: Meaningful verbal contact was maintained at all times during the procedure  Local Anesthetic: Lidocaine 1-2%  Position: Prone   Indications: 1. Lumbar facet syndrome (Bilateral) (L>R)   2. Spondylosis of lumbar region without myelopathy or radiculopathy   3. Lumbar facet hypertrophy (L4-5 and L5-S1) (Bilateral)   4. DDD (degenerative disc disease), lumbosacral   5. Chronic low back pain (1ry area of Pain) (Bilateral) (L>R) w/o sciatica   6. Chronic anticoagulation (Coumadin)   7. H/O heart valve replacement with mechanical valve   8.  History of Allergy to iodine   9. History of allergy to radiographic contrast media   10. BMI 40.0-44.9, adult ([place])   11. Obstructive sleep apnea syndrome    Pain Score: Pre-procedure: 5 /10 Post-procedure: 0-No pain/10   Pre-op Assessment:  [name] is a 49 y.o. (year old), female patient, seen today for interventional treatment. She  has a past surgical history that includes Abdominal hysterectomy; Cesarean section; Hernia repair; Cardiac valuve replacement; Esophagogastroduodenoscopy (egd) with propofol (N/A, 10/07/2016); Breast excisional biopsy (Right, 1992); Breast biopsy (Left, 07/25/2017); Breast biopsy (Right, 10/22/2019); Breast biopsy (Right, 10/22/2019); Breast lumpectomy (Right, 11/20/2019); and Breast biopsy (Right, 11/20/2019). [name] has a current medication list which includes the following prescription(s): aimovig, albuterol, amoxicillin, aspirin ec, biotin, candesartan, candesartan, carvedilol, carvedilol, cyclobenzaprine, diazepam, docusate sodium, duloxetine, ergocalciferol, fluticasone, gabapentin, hydrocortisone, iron polysaccharides, levalbuterol, levocetirizine, linaclotide, meclizine, minoxidil, pantoprazole, pravastatin, spironolactone, sucralfate, warfarin, warfarin, tramadol, and [START ON 01/23/2020] tramadol, and the following Facility-Administered Medications: fentanyl and midazolam. Her primarily concern today is the Back Pain (lower)  Initial Vital Signs:  Pulse/HCG Rate: 67ECG Heart Rate: 73 Temp: (!) 97.3 F (36.3 C) Resp: 18 BP: 122/80 SpO2: 100 %  BMI: Estimated body mass index is 41.52 kg/m as calculated from the following:   Height as of this encounter: 5' 2"  (1.575 m).   Weight as of this encounter: 227 lb (103 kg).  Risk Assessment: Allergies: Reviewed. [name] allergic to ivp dye [iodinated diagnostic agents]; sulfa antibiotics; banana; guaifenesin; hctz [hydrochlorothiazide]; losartan; povidone iodine; povidone-iodine; lisinopril; and mucinex  [guaifenesin er].  Allergy Precautions: None required Coagulopathies: Reviewed. None identified.  Blood-thinner therapy: None at this time Active Infection(s): Reviewed. None identified. [name] is afebrile  Site Confirmation: [name] was asked to confirm the procedure and laterality before marking  the site Procedure checklist: Completed Consent: Before the procedure and under the influence of no sedative(s), amnesic(s), or anxiolytics, the patient was informed of the treatment options, risks and possible complications. To fulfill our ethical and legal obligations, as recommended by the American Medical Association's Code of Ethics, I have informed the patient of my clinical impression; the nature and purpose of the treatment or procedure; the risks, benefits, and possible complications of the intervention; the alternatives, including doing nothing; the risk(s) and benefit(s) of the alternative treatment(s) or procedure(s); and the risk(s) and benefit(s) of doing nothing. The patient was provided information about the general risks and possible complications associated with the procedure. These may include, but are not limited to: failure to achieve desired goals, infection, bleeding, organ or nerve damage, allergic reactions, paralysis, and death. In addition, the patient was informed of those risks and complications associated to Spine-related procedures, such as failure to decrease pain; infection (i.e.: Meningitis, epidural or intraspinal abscess); bleeding (i.e.: epidural hematoma, subarachnoid hemorrhage, or any other type of intraspinal or peri-dural bleeding); organ or nerve damage (i.e.: Any type of peripheral nerve, nerve root, or spinal cord injury) with subsequent damage to sensory, motor, and/or autonomic systems, resulting in permanent pain, numbness, and/or weakness of one or several areas of the body; allergic reactions; (i.e.: anaphylactic reaction); and/or death. Furthermore, the  patient was informed of those risks and complications associated with the medications. These include, but are not limited to: allergic reactions (i.e.: anaphylactic or anaphylactoid reaction(s)); adrenal axis suppression; blood sugar elevation that in diabetics may result in ketoacidosis or comma; water retention that in patients with history of congestive heart failure may result in shortness of breath, pulmonary edema, and decompensation with resultant heart failure; weight gain; swelling or edema; medication-induced neural toxicity; particulate matter embolism and blood vessel occlusion with resultant organ, and/or nervous system infarction; and/or aseptic necrosis of one or more joints. Finally, the patient was informed that Medicine is not an exact science; therefore, there is also the possibility of unforeseen or unpredictable risks and/or possible complications that may result in a catastrophic outcome. The patient indicated having understood very clearly. We have given the patient no guarantees and we have made no promises. Enough time was given to the patient to ask questions, all of which were answered to the patient's satisfaction. Ms. Miltner has indicated that she wanted to continue with the procedure. Attestation: I, the ordering provider, attest that I have discussed with the patient the benefits, risks, side-effects, alternatives, likelihood of achieving goals, and potential problems during recovery for the procedure that I have provided informed consent. Date  Time: 01/16/2020  8:31 AM  Pre-Procedure Preparation:  Monitoring: As per clinic protocol. Respiration, ETCO2, SpO2, BP, heart rate and rhythm monitor placed and checked for adequate function Safety Precautions: Patient was assessed for positional comfort and pressure points before starting the procedure. Time-out: I initiated and conducted the "Time-out" before starting the procedure, as per protocol. The patient was asked to  participate by confirming the accuracy of the "Time Out" information. Verification of the correct person, site, and procedure were performed and confirmed by me, the nursing staff, and the patient. "Time-out" conducted as per Joint Commission's Universal Protocol (UP.01.01.01). Time: 0925  Description of Procedure:          Laterality: Bilateral. The procedure was performed in identical fashion on both sides. Levels:  L2, L3, L4, L5, & S1 Medial Branch Level(s) Area Prepped: Posterior Lumbosacral Region Prepping solution: ChloraPrep (2% chlorhexidine  gluconate and 70% isopropyl alcohol) Safety Precautions: Aspiration looking for blood return was conducted prior to all injections. At no point did we inject any substances, as a needle was being advanced. Before injecting, the patient was told to immediately notify me if she was experiencing any new onset of "ringing in the ears, or metallic taste in the mouth". No attempts were made at seeking any paresthesias. Safe injection practices and needle disposal techniques used. Medications properly checked for expiration dates. SDV (single dose vial) medications used. After the completion of the procedure, all disposable equipment used was discarded in the proper designated medical waste containers. Local Anesthesia: Protocol guidelines were followed. The patient was positioned over the fluoroscopy table. The area was prepped in the usual manner. The time-out was completed. The target area was identified using fluoroscopy. A 12-in long, straight, sterile hemostat was used with fluoroscopic guidance to locate the targets for each level blocked. Once located, the skin was marked with an approved surgical skin marker. Once all sites were marked, the skin (epidermis, dermis, and hypodermis), as well as deeper tissues (fat, connective tissue and muscle) were infiltrated with a small amount of a short-acting local anesthetic, loaded on a 10cc syringe with a 25G, 1.5-in   Needle. An appropriate amount of time was allowed for local anesthetics to take effect before proceeding to the next step. Local Anesthetic: Lidocaine 2.0% The unused portion of the local anesthetic was discarded in the proper designated containers. Technical explanation of process:  L2 Medial Branch Nerve Block (MBB): The target area for the L2 medial branch is at the junction of the postero-lateral aspect of the superior articular process and the superior, posterior, and medial edge of the transverse process of L3. Under fluoroscopic guidance, a Quincke needle was inserted until contact was made with os over the superior postero-lateral aspect of the pedicular shadow (target area). After negative aspiration for blood, 0.5 mL of the nerve block solution was injected without difficulty or complication. The needle was removed intact. L3 Medial Branch Nerve Block (MBB): The target area for the L3 medial branch is at the junction of the postero-lateral aspect of the superior articular process and the superior, posterior, and medial edge of the transverse process of L4. Under fluoroscopic guidance, a Quincke needle was inserted until contact was made with os over the superior postero-lateral aspect of the pedicular shadow (target area). After negative aspiration for blood, 0.5 mL of the nerve block solution was injected without difficulty or complication. The needle was removed intact. L4 Medial Branch Nerve Block (MBB): The target area for the L4 medial branch is at the junction of the postero-lateral aspect of the superior articular process and the superior, posterior, and medial edge of the transverse process of L5. Under fluoroscopic guidance, a Quincke needle was inserted until contact was made with os over the superior postero-lateral aspect of the pedicular shadow (target area). After negative aspiration for blood, 0.5 mL of the nerve block solution was injected without difficulty or complication. The needle  was removed intact. L5 Medial Branch Nerve Block (MBB): The target area for the L5 medial branch is at the junction of the postero-lateral aspect of the superior articular process and the superior, posterior, and medial edge of the sacral ala. Under fluoroscopic guidance, a Quincke needle was inserted until contact was made with os over the superior postero-lateral aspect of the pedicular shadow (target area). After negative aspiration for blood, 0.5 mL of the nerve block solution was injected without  difficulty or complication. The needle was removed intact. S1 Medial Branch Nerve Block (MBB): The target area for the S1 medial branch is at the posterior and inferior 6 o'clock position of the L5-S1 facet joint. Under fluoroscopic guidance, the Quincke needle inserted for the L5 MBB was redirected until contact was made with os over the inferior and postero aspect of the sacrum, at the 6 o' clock position under the L5-S1 facet joint (Target area). After negative aspiration for blood, 0.5 mL of the nerve block solution was injected without difficulty or complication. The needle was removed intact.  Nerve block solution: 0.2% PF-Ropivacaine + Triamcinolone (40 mg/mL) diluted to a final concentration of 4 mg of Triamcinolone/mL of Ropivacaine The unused portion of the solution was discarded in the proper designated containers. Procedural Needles: 22-gauge, 5-inch, Quincke needles used for all levels.  Once the entire procedure was completed, the treated area was cleaned, making sure to leave some of the prepping solution back to take advantage of its long term bactericidal properties.   Illustration of the posterior view of the lumbar spine and the posterior neural structures. Laminae of L2 through S1 are labeled. DPRL5, dorsal primary ramus of L5; DPRS1, dorsal primary ramus of S1; DPR3, dorsal primary ramus of L3; FJ, facet (zygapophyseal) joint L3-L4; I, inferior articular process of L4; LB1, lateral branch  of dorsal primary ramus of L1; IAB, inferior articular branches from L3 medial branch (supplies L4-L5 facet joint); IBP, intermediate branch plexus; MB3, medial branch of dorsal primary ramus of L3; NR3, third lumbar nerve root; S, superior articular process of L5; SAB, superior articular branches from L4 (supplies L4-5 facet joint also); TP3, transverse process of L3.  Vitals:   01/16/20 0939 01/16/20 0945 01/16/20 0955 01/16/20 1006  BP: (!) 83/42 98/63 107/63 108/70  Pulse:      Resp: 18 19 20 20   Temp:  (!) 97.3 F (36.3 C)  (!) 97.3 F (36.3 C)  SpO2: 94% 97% 97% 98%  Weight:      Height:         Start Time: 0925 hrs. End Time: 0939 hrs.  Imaging Guidance (Spinal):          Type of Imaging Technique: Fluoroscopy Guidance (Spinal) Indication(s): Assistance in needle guidance and placement for procedures requiring needle placement in or near specific anatomical locations not easily accessible without such assistance. Exposure Time: Please see nurses notes. Contrast: None used. Fluoroscopic Guidance: I was personally present during the use of fluoroscopy. "Tunnel Vision Technique" used to obtain the best possible view of the target area. Parallax error corrected before commencing the procedure. "Direction-depth-direction" technique used to introduce the needle under continuous pulsed fluoroscopy. Once target was reached, antero-posterior, oblique, and lateral fluoroscopic projection used confirm needle placement in all planes. Images permanently stored in EMR. Interpretation: No contrast injected. I personally interpreted the imaging intraoperatively. Adequate needle placement confirmed in multiple planes. Permanent images saved into the patient's record.  Antibiotic Prophylaxis:   Anti-infectives (From admission, onward)   Start     Dose/Rate Route Frequency Ordered Stop   01/16/20 0915  ceFAZolin (ANCEF) IVPB 1 g/50 mL premix     1 g 100 mL/hr over 30 Minutes Intravenous  Once  01/16/20 0904 01/16/20 0937     Indication(s): None identified  Post-operative Assessment:  Post-procedure Vital Signs:  Pulse/HCG Rate: 6771 Temp: (!) 97.3 F (36.3 C) Resp: 20 BP: 108/70 SpO2: 98 %  EBL: None  Complications: No immediate post-treatment complications observed  by team, or reported by patient.  Note: The patient tolerated the entire procedure well. A repeat set of vitals were taken after the procedure and the patient was kept under observation following institutional policy, for this type of procedure. Post-procedural neurological assessment was performed, showing return to baseline, prior to discharge. The patient was provided with post-procedure discharge instructions, including a section on how to identify potential problems. Should any problems arise concerning this procedure, the patient was given instructions to immediately contact us, at any time, without hesitation. In any case, we plan to contact the patient by telephone for a follow-up status report regarding this interventional procedure.  Comments:  No additional relevant information.  Plan of Care  Orders:  Orders Placed This Encounter  Procedures  . LUMBAR FACET(MEDIAL BRANCH NERVE BLOCK) MBNB    Scheduling Instructions:     Procedure: Lumbar facet block (AKA.: Lumbosacral medial branch nerve block)     Side: Bilateral     Level: L3-4, L4-5, & L5-S1 Facets (L2, L3, L4, L5, & S1 Medial Branch Nerves)     Sedation: Patient's choice.     Timeframe: Today    Order Specific Question:   Where will this procedure be performed?    Answer:   [facility]  . DG PAIN [facility] C-ARM 1-60 MIN NO REPORT    Intraoperative interpretation by procedural physician at [place].    Standing Status:   Standing    Number of Occurrences:   1    Order Specific Question:   Reason for exam:    Answer:   Assistance in needle guidance and placement for procedures requiring needle placement in or near  specific anatomical locations not easily accessible without such assistance.  . Blood Thinner Instructions to Nursing    If the patient requires a Lovenox-bridge therapy, make sure arrangements are made to institute it with the assistance of the PCP.    Standing Status:   Standing    Number of Occurrences:   36    Standing Expiration Date:   07/15/2021    Scheduling Instructions:     Always stop the Coumadin (Warfarin) X 5 days prior to procedure or surgery.  . Informed Consent Details: Physician/Practitioner Attestation; Transcribe to consent form and obtain patient signature    Nursing Order: Transcribe to consent form and obtain patient signature. Note: Always confirm laterality of pain with Ms. Lall, before procedure. Procedure: Lumbar Facet Block  under fluoroscopic guidance Indication/Reason: Low Back Pain, with our without leg pain, due to Facet Joint Arthralgia (Joint Pain) known as Lumbar Facet Syndrome, secondary to Lumbar, and/or Lumbosacral Spondylosis (Arthritis of the Spine), without myelopathy or radiculopathy (Nerve Damage). Provider Attestation: I, Eldon Dossie Arbour, MD, (Pain Management Specialist), the physician/practitioner, attest that I have discussed with the patient the benefits, risks, side effects, alternatives, likelihood of achieving goals and potential problems during recovery for the procedure that I have provided informed consent.  . Care order/instruction: Please confirm that the patient has stopped the Coumadin (Warfarin) X 5 days prior to procedure or surgery.    Please confirm that the patient has stopped the Coumadin (Warfarin) X 5 days prior to procedure or surgery.    Standing Status:   Standing    Number of Occurrences:   1  . Provide equipment / supplies at bedside    Equipment required: Single use, disposable, "Block Tray"    Standing Status:   Standing    Number of Occurrences:   1  Order Specific Question:   Specify    Answer:   Block Tray  .  Keep Oxygen Setup At Bedside    Discontinue once pulse oxymetry is back to normal levels for patient.    Standing Status:   Standing    Number of Occurrences:   1  . Monitor O2 SATs    Discontinue once pulse oxymetry is back to normal levels for patient.    Standing Status:   Standing    Number of Occurrences:   1  . Nursing Instructions:    1. Medication Agreement: Please go over agreement with the patient. Have the patient read and sign the agreement. Provide patient with a copy of the signed agreement. 2. Make sure that the patient has completed the ORT (Opioid Risk Tool). 3. Provide the patient with a copy of our "Medicatiion Policy", "Medication Recommendations and Reminders", and "CBD information". 4. Remind the patient to always bring their medications and medication bottles (even if empty) to all appointments except for procedure appointments.    Scheduling Instructions:     Sign "Medication Agreement", complete "Opioid Risk Tool", inform patient of our practice "Medication Policies" (Pill counts, always bring bottles, except on procedure days).  . Nursing communication    Scheduling Instructions:     Complete/update the opioid risk tool (ORT) questionnaire.  . Oxygen therapy Mode or (Route): Nasal cannula; FiO2: 21%; Liters Per Minute: 2; Keep 02 saturation: Above 90% for COPD and above 92% for non-COPD patients    Keep patient on oxygen by [place]. Discontinue once pulse oxymetry is back to normal levels for patient.    Standing Status:   Standing    Number of Occurrences:   1    Order Specific Question:   Mode or (Route)    Answer:   Nasal cannula    Order Specific Question:   FiO2    Answer:   21%    Order Specific Question:   Liters Per Minute    Answer:   2    Order Specific Question:   Keep 02 saturation    Answer:   Above 90% for COPD and above 92% for non-COPD patients  . Bleeding precautions    Standing Status:   Standing    Number of Occurrences:   1  . Miscellanous  precautions    Standing Status:   Standing    Number of Occurrences:   1  . Miscellanous precautions    NOTE: Although It is true that patients can have allergies to shellfish and that shellfish contain iodine, most shellfish  allergies are due to two protein allergens present in the shellfish: tropomyosins and parvalbumin. Not all patients with shellfish allergies are allergic to iodine. However, as a precaution, avoid using iodine containing products.    Standing Status:   Standing    Number of Occurrences:   1  . Skin care precautions    Patient indicates being allergic to a specific skin prepping solution. Please avoid.    Standing Status:   Standing    Number of Occurrences:   1   Chronic Opioid Analgesic:  Tramadol 50 mg, 1 tab PO q 6 hrs (200 mg/day of tramadol) Highest recorded MME/day: 30 mg/day MME/day: 20 mg/day   Medications ordered for procedure: Meds ordered this encounter  Medications  . lidocaine (XYLOCAINE) 2 % (with pres) injection 400 mg  . lactated ringers infusion 1,000 mL  . midazolam (VERSED) 5 MG/5ML injection 1-2 mg    Make  sure Flumazenil is available in the pyxis when using this medication. If oversedation occurs, administer 0.2 mg IV over 15 sec. If after 45 sec no response, administer 0.2 mg again over 1 min; may repeat at 1 min intervals; not to exceed 4 doses (1 mg)  . fentaNYL (SUBLIMAZE) injection 25-50 mcg    Make sure Narcan is available in the pyxis when using this medication. In the event of respiratory depression (RR< 8/min): Titrate NARCAN (naloxone) in increments of 0.1 to 0.2 mg IV at 2-3 minute intervals, until desired degree of reversal.  . ceFAZolin (ANCEF) IVPB 1 g/50 mL premix    Order Specific Question:   Antibiotic Indication:    Answer:   Surgical Prophylaxis    Order Specific Question:   Other Indication:    Answer:   Surgical Prophylaxis  . ropivacaine (PF) 2 mg/mL (0.2%) (NAROPIN) injection 18 mL  . triamcinolone acetonide  (KENALOG-40) injection 80 mg  . gabapentin (NEURONTIN) 300 MG capsule    Sig: Take 1 capsule (300 mg total) by mouth at bedtime.    Dispense:  30 capsule    Refill:  0    Fill one day early if pharmacy is closed on scheduled refill date. May substitute for generic if available.  . traMADol (ULTRAM) 50 MG tablet    Sig: Take 1-2 tablets (50-100 mg total) by mouth 4 (four) times daily for 7 days.    Dispense:  56 tablet    Refill:  0    Chronic Pain: STOP Act (Not applicable) Fill 1 day early if closed on refill date. Do not fill until: 01/16/2020. To last until: 01/23/2020. Avoid benzodiazepines within 8 hours of opioids  . traMADol (ULTRAM) 50 MG tablet    Sig: Take 1-2 tablets (50-100 mg total) by mouth every 6 (six) hours as needed for severe pain. Must last 30 days    Dispense:  240 tablet    Refill:  0    Chronic Pain: STOP Act (Not applicable) Fill 1 day early if closed on refill date. Do not fill until: 01/23/2020. To last until: 02/22/2020. Avoid benzodiazepines within 8 hours of opioids   Medications administered: We administered lidocaine, lactated ringers, midazolam, fentaNYL, ceFAZolin, ropivacaine (PF) 2 mg/mL (0.2%), and triamcinolone acetonide.  See the medical record for exact dosing, route, and time of administration.  Follow-up plan:   Return in about 2 weeks (around 01/30/2020) for (VV), (PP).       Interventional management options: Planned, scheduled, and/or pending:   NOTE: COUMADIN ANTICOAGULATION (Lovenox bridge therapy by Dr. Nehemiah Huynh) Diagnostic bilateral lumbar facet block #1    Considering:   Diagnostic bilateral lumbar facet block  Diagnostic L4-5 LESI  Diagnostic bilateral L5 TFESI    PRN Procedures:   None at this time    Recent Visits Date Type Provider Dept  12/30/19 Office Visit Milinda Pointer, [facility]  12/16/19 Office Visit Milinda Pointer, MD [facility]  Showing recent visits within past 90 days and meeting all  other requirements   Today's Visits Date Type Provider Dept  01/16/20 Procedure visit Milinda Pointer, MD [facility]  Showing today's visits and meeting all other requirements   Future Appointments Date Type Provider Dept  02/03/20 Appointment Milinda Pointer, MD [facility]  Showing future appointments within next 90 days and meeting all other requirements   Disposition: Discharge home  Discharge (Date  Time): 01/16/2020; 1016 hrs.   Primary Care Physician: Tracie Harrier, MD Location:  [facility] Note by: Gaspar Cola, MD Date: 01/16/2020; Time: 1:09 PM  Disclaimer:  Medicine is not an Chief Strategy Officer. The only guarantee in medicine is that nothing is guaranteed. It is important to note that the decision to proceed with this intervention was based on the information collected from the patient. The Data and conclusions were drawn from the patient's questionnaire, the interview, and the physical examination. Because the information was provided in large part by the patient, it cannot be guaranteed that it has not been purposely or unconsciously manipulated. Every effort has been made to obtain as much relevant data as possible for this evaluation. It is important to note that the conclusions that lead to this procedure are derived in large part from the available data. Always take into account that the treatment will also be dependent on availability of resources and existing treatment guidelines, considered by other Pain Management Practitioners as being common knowledge and practice, at the time of the intervention. For Medico-Legal purposes, it is also important to point out that variation in procedural techniques and pharmacological choices are the acceptable norm. The indications, contraindications, technique, and results of the above procedure should only be interpreted and judged by a Board-Certified Interventional Pain  Specialist with extensive familiarity and expertise in the same exact procedure and technique.

## 2020-01-16 NOTE — Addendum Note (Signed)
Addended by: Milinda Pointer A on: 01/16/2020 01:09 PM   Modules accepted: Orders

## 2020-01-16 NOTE — Telephone Encounter (Signed)
Dr. Dossie Arbour,                   This patient needs her Gabapentin written for a 90 day supply in order for insurance to pay. Please send a new script and let me know if you want me to have the other one cancelled. Thank you.

## 2020-01-17 ENCOUNTER — Telehealth: Payer: Self-pay

## 2020-01-17 ENCOUNTER — Telehealth: Payer: Self-pay | Admitting: Pain Medicine

## 2020-01-17 NOTE — Telephone Encounter (Signed)
Pt states that tramadol is making her nauseas and would like to know if Dr Delane Ginger can send in some zofran to help.

## 2020-01-17 NOTE — Telephone Encounter (Signed)
Post procedure phone call.  Patient states that she is doing good.

## 2020-01-17 NOTE — Telephone Encounter (Signed)
Dr Dossie Arbour, the Tramadol is making the patient nauseated and would like Zofran.

## 2020-01-28 ENCOUNTER — Other Ambulatory Visit: Payer: Self-pay | Admitting: Pain Medicine

## 2020-01-28 ENCOUNTER — Emergency Department
Admission: EM | Admit: 2020-01-28 | Discharge: 2020-01-28 | Discharge status: Against Medical Advice | Payer: Managed Care, Other (non HMO)

## 2020-01-28 NOTE — ED Triage Notes (Signed)
Called no answer for triage

## 2020-01-28 NOTE — ED Notes (Signed)
Called again with no answer for triage.

## 2020-01-28 NOTE — Telephone Encounter (Signed)
Stop the tramadol if giving her problems.

## 2020-01-28 NOTE — ED Notes (Signed)
Called and no answer.

## 2020-01-28 NOTE — Telephone Encounter (Signed)
I will take care of it on her 3/15 appointment

## 2020-01-28 NOTE — ED Triage Notes (Signed)
Called no answer

## 2020-01-29 NOTE — Progress Notes (Signed)
Pain relief after procedure (treated area only): (Questions asked to patient) 1. Starting about 15 minutes after the procedure, and "while the area was still numb" (from the local anesthetics), were you having any of your usual pain "in that area" (the treated area)?  (NOTE: NOT including the discomfort from the needle sticks.) First 1 hour: 100 % better. First 4-6 hours: 100 % better.   3. How much better is your pain now, when compared to before the procedure? Current benefit: 50 % better. 4. Can you move better now? Improvement in ROM (Range of Motion): Yes. 5. Can you do more now? Improvement in function: No. 4. Did you have any problems with the procedure? Side-effects/Complications: No.

## 2020-02-02 NOTE — Progress Notes (Signed)
Patient: Shelly Huynh  Service Category: E/M  Provider: Gaspar Cola, MD  DOB: 1970-12-18  DOS: 02/03/2020  Location: Office  MRN: [id number]  Setting: Ambulatory outpatient  Referring Provider: Tracie Harrier, MD  Type: Established Patient  Specialty: Interventional Pain Management  PCP: Tracie Harrier, MD  Location: Remote location  Delivery: TeleHealth     Virtual Encounter - Pain Management PROVIDER NOTE: Information contained herein reflects review and annotations entered in association with encounter. Interpretation of such information and data should be left to medically-trained personnel. Information provided to patient can be located elsewhere in the medical record under "Patient Instructions". Document created using STT-dictation technology, any transcriptional errors that may result from process are unintentional.    Contact & Pharmacy Preferred: [phone] Home: [phone] (home) Mobile: [phone] (mobile) E-mail: [email or web address] [place], [place] [address] [place] [address] Phone: [phone] Fax: [phone]   Pre-screening  Shelly Huynh offered "in-person" vs "virtual" encounter. She indicated preferring virtual for this encounter.   Reason COVID-19*  Social distancing based on CDC and AMA recommendations.   I contacted Shelly Huynh on 02/03/2020 via telephone.      I clearly identified myself as Gaspar Cola, MD. I verified that I was speaking with the correct person using two identifiers (Name: JEWELZ RICKLEFS, and date of birth: 02-09-1971).  Consent I sought verbal advanced consent from Shelly Huynh for virtual visit interactions. I informed Shelly Huynh of possible security and privacy concerns, risks, and limitations associated with providing "not-in-person" medical evaluation and management services. I also informed Shelly Huynh  of the availability of "in-person" appointments. Finally, I informed her that there would be a charge for the virtual visit and that she could be  personally, fully or partially, financially responsible for it. Shelly Huynh expressed understanding and agreed to proceed.   Historic Elements   Shelly Huynh is a 49 y.o. year old, female patient evaluated today after her last contact with our practice on 01/17/2020. Shelly Huynh  has a past medical history of Acid reflux, Anxiety, Arthritis, CHF (congestive heart failure) ([place]), Depression, Diabetes mellitus without complication ([place]), Heart valve replaced, High cholesterol, High cholesterol, Hypertension, Hypokalemia, IBS (irritable bowel syndrome), and Sciatic pain. She also  has a past surgical history that includes Abdominal hysterectomy; Cesarean section; Hernia repair; Cardiac valuve replacement; Esophagogastroduodenoscopy (egd) with propofol (N/A, 10/07/2016); Breast excisional biopsy (Right, 1992); Breast biopsy (Left, 07/25/2017); Breast biopsy (Right, 10/22/2019); Breast biopsy (Right, 10/22/2019); Breast lumpectomy (Right, 11/20/2019); and Breast biopsy (Right, 11/20/2019). Shelly Huynh has a current medication list which includes the following prescription(s): aimovig, albuterol, amoxicillin, aspirin ec, biotin, candesartan, carvedilol, cholecalciferol, cyclobenzaprine, diazepam, docusate sodium, duloxetine, fluticasone, [START ON 02/15/2020] gabapentin, hydrocortisone, iron polysaccharides, levalbuterol, levocetirizine, linaclotide, meclizine, minoxidil, pantoprazole, pravastatin, spironolactone, sucralfate, [START ON 02/22/2020] tramadol, warfarin, and warfarin. She  reports that she quit smoking about 4 years ago. She has never used smokeless tobacco. She reports that she does not drink alcohol or use drugs. Shelly Huynh is allergic to ivp dye [iodinated diagnostic agents]; sulfa antibiotics; banana; guaifenesin; hctz [hydrochlorothiazide]; losartan; povidone  iodine; povidone-iodine; lisinopril; and mucinex [guaifenesin er].   HPI  Today, she is being contacted for a post-procedure assessment.  The patient indicates having done extremely well after the diagnostic bilateral lumbar facet block.  The fact that it did provide her with 100% relief of the pain for  the duration of of the local anesthetic confirms that this pain in her lower back is coming from the facet joints.  However, the patient indicates that while it was numb, she was not getting the benefit in the area of the hips and therefore this seems to be a separate issue.  She still also having pain in the area of the shoulders but today the patient has requested that we addressed the hip pain first.  In view of this, we will be scheduling the patient to return to clinics, as soon as possible, for a bilateral intra-articular hip joint injection under fluoroscopic guidance and IV sedation.  Post-Procedure Evaluation  Procedure (01/17/2020): Diagnostic bilateral lumbar facet block #1 under fluoroscopic guidance, and IV sedation Pre-procedure pain level:  5/10 Post-procedure: 0/10 (100% relief)  Sedation: Sedation provided.  Pain relief after procedure (treated area only): (Questions asked to patient) 1. Starting about 15 minutes after the procedure, and "while the area was still numb" (from the local anesthetics), were you having any of your usual pain "in that area" (the treated area)?  (NOTE: NOT including the discomfort from the needle sticks.) First 1 hour: 100 % better. First 4-6 hours: 100 % better.   3. How much better is your pain now, when compared to before the procedure? Current benefit: 50 % better. 4. Can you move better now? Improvement in ROM (Range of Motion): Yes. 5. Can you do more now? Improvement in function: No. 4. Did you have any problems with the procedure? Side-effects/Complications: No. Current benefits: Defined as benefit that persist at this time.   Analgesia:   50% improved Function: No improvement ROM: Shelly Huynh reports improvement in ROM  Pharmacotherapy Assessment  Analgesic: Tramadol 50 mg, 1 tab PO q 6 hrs (200 mg/day of tramadol) Highest recorded MME/day: 30 mg/day MME/day: 20 mg/day   Monitoring: [place] PMP: PDMP reviewed during this encounter.       Pharmacotherapy: No side-effects or adverse reactions reported. Compliance: No problems identified. Effectiveness: Clinically acceptable. Plan: Refer to "POC".  UDS: No results found for: SUMMARY Laboratory Chemistry Profile   Renal Lab Results  Component Value Date   BUN 7 12/23/2019   CREATININE 0.73 12/23/2019   GFRAA >60 12/23/2019   GFRNONAA >60 12/23/2019    Hepatic Lab Results  Component Value Date   AST 19 12/23/2019   ALT 14 12/23/2019   ALBUMIN 4.2 12/23/2019   ALKPHOS 65 12/23/2019   LIPASE 33 06/14/2014    Electrolytes Lab Results  Component Value Date   NA 138 12/23/2019   K 3.9 12/23/2019   CL 107 12/23/2019   CALCIUM 9.2 12/23/2019   MG 2.2 12/23/2019   PHOS 3.6 03/05/2015    Bone Lab Results  Component Value Date   VD25OH 48.2 12/23/2019    Inflammation (CRP: Acute Phase) (ESR: Chronic Phase) Lab Results  Component Value Date   CRP 0.8 12/23/2019   ESRSEDRATE 30 (H) 12/23/2019   LATICACIDVEN 0.9 06/12/2017      Note: Above Lab results reviewed.  Imaging  DG PAIN CLINIC C-ARM 1-60 MIN NO REPORT Fluoro was used, but no Radiologist interpretation will be provided.  Please refer to "NOTES" tab for provider progress note.  Assessment  The primary encounter diagnosis was Chronic low back pain (1ry area of Pain) (Bilateral) (L>R) w/o sciatica. Diagnoses of Lumbar facet syndrome (Bilateral) (L>R), Neurogenic pain, Chronic pain syndrome, and Chronic hip pain (3ry area of Pain) (Bilateral) (L>R) were also pertinent to this visit.  Plan of Care  Problem-specific:  No problem-specific Assessment & Plan notes found for this encounter.  Shelly Huynh has a current medication list which includes the following long-term medication(s): albuterol, candesartan, carvedilol, duloxetine, [START ON 02/15/2020] gabapentin, iron polysaccharides, levocetirizine, linaclotide, pantoprazole, spironolactone, sucralfate, [START ON 02/22/2020] tramadol, warfarin, and warfarin.  Pharmacotherapy (Medications Ordered): Meds ordered this encounter  Medications  . gabapentin (NEURONTIN) 300 MG capsule    Sig: Take 1 capsule (300 mg total) by mouth at bedtime.    Dispense:  30 capsule    Refill:  2    Fill one day early if pharmacy is closed on scheduled refill date. May substitute for generic if available.  . traMADol (ULTRAM) 50 MG tablet    Sig: Take 1 tablet (50 mg total) by mouth daily as needed for severe pain. Must last 30 days    Dispense:  45 tablet    Refill:  0    Chronic Pain: STOP Act (Not applicable) Fill 1 day early if closed on refill date. Do not fill until: 02/22/2020. To last until: 05/22/2020. Avoid benzodiazepines within 8 hours of opioids   Orders:  Orders Placed This Encounter  Procedures  . LUMBAR FACET(MEDIAL BRANCH NERVE BLOCK) MBNB    Standing Status:   Standing    Number of Occurrences:   6    Standing Expiration Date:   08/05/2021    Scheduling Instructions:     Procedure: Lumbar facet block (AKA.: Lumbosacral medial branch nerve block)     Side: Bilateral     Level: L3-4, L4-5, & L5-S1 Facets (L2, L3, L4, L5, & S1 Medial Branch Nerves)     Sedation: Patient's choice.     Timeframe: PRN    Order Specific Question:   Where will this procedure be performed?    Answer:   [facility]  . HIP INJECTION    Standing Status:   Future    Standing Expiration Date:   03/04/2020    Scheduling Instructions:     Side: Bilateral     Sedation: With Sedation.     Timeframe: As soon as schedule allows   Follow-up plan:   Return for Procedure (w/ sedation): (B) L-FCT BLK #2, (Blood-thinner Protocol).      Interventional  management options: Planned, scheduled, and/or pending:   NOTE: COUMADIN ANTICOAGULATION (Lovenox bridge therapy by Dr. Nehemiah Huynh) Diagnostic bilateral IA hip injection #1    Considering:   Diagnostic L4-5 LESI  Diagnostic bilateral L5 TFESI    PRN Procedures:   Diagnostic bilateral lumbar facet block #2     Recent Visits Date Type Provider Dept  01/16/20 Procedure visit Milinda Pointer, MD [facility]  12/30/19 Office Visit Milinda Pointer, MD [facility]  12/16/19 Office Visit Milinda Pointer, MD [facility]  Showing recent visits within past 90 days and meeting all other requirements   Today's Visits Date Type Provider Dept  02/03/20 Telemedicine Milinda Pointer, MD [facility]  Showing today's visits and meeting all other requirements   Future Appointments No visits were found meeting these conditions.  Showing future appointments within next 90 days and meeting all other requirements   I discussed the assessment and treatment plan with the patient. The patient was provided an opportunity to ask questions and all were answered. The patient agreed with the plan and demonstrated an understanding of the instructions.  Patient advised to call back or seek an in-person evaluation if the symptoms or condition  worsens.  Duration of encounter: 12 minutes.  Note by: Gaspar Cola, MD Date: 02/03/2020; Time: 8:35 AM

## 2020-02-03 ENCOUNTER — Telehealth: Payer: Self-pay

## 2020-02-03 ENCOUNTER — Other Ambulatory Visit: Payer: Self-pay

## 2020-02-03 ENCOUNTER — Ambulatory Visit: Payer: Managed Care, Other (non HMO) | Attending: Pain Medicine | Admitting: Pain Medicine

## 2020-02-03 DIAGNOSIS — M25552 Pain in left hip: Secondary | ICD-10-CM

## 2020-02-03 DIAGNOSIS — M47816 Spondylosis without myelopathy or radiculopathy, lumbar region: Secondary | ICD-10-CM | POA: Diagnosis not present

## 2020-02-03 DIAGNOSIS — G8929 Other chronic pain: Secondary | ICD-10-CM

## 2020-02-03 DIAGNOSIS — M545 Low back pain: Secondary | ICD-10-CM | POA: Diagnosis not present

## 2020-02-03 DIAGNOSIS — M25551 Pain in right hip: Secondary | ICD-10-CM

## 2020-02-03 DIAGNOSIS — M792 Neuralgia and neuritis, unspecified: Secondary | ICD-10-CM

## 2020-02-03 DIAGNOSIS — G894 Chronic pain syndrome: Secondary | ICD-10-CM | POA: Diagnosis not present

## 2020-02-03 MED ORDER — TRAMADOL HCL 50 MG PO TABS: 50.0000 mg | ORAL_TABLET | Freq: Every day | ORAL | 0 refills | Status: DC | PRN

## 2020-02-03 MED ORDER — GABAPENTIN 300 MG PO CAPS: 300.0000 mg | ORAL_CAPSULE | Freq: Every day | ORAL | 2 refills | Status: DC

## 2020-02-03 NOTE — Telephone Encounter (Signed)
Left message for patient to call ui for insurance information

## 2020-02-03 NOTE — Telephone Encounter (Signed)
She called back to tell Dr. Dossie Arbour that the pharmacy will need pa for tramadol as well.

## 2020-02-03 NOTE — Patient Instructions (Signed)
____________________________________________________________________________________________  Preparing for Procedure with Sedation  Procedure appointments are limited to planned procedures: . No Prescription Refills. . No disability issues will be discussed. . No medication changes will be discussed.  Instructions: . Oral Intake: Do not eat or drink anything for at least 8 hours prior to your procedure. . Transportation: Public transportation is not allowed. Bring an adult driver. The driver must be physically present in our waiting room before any procedure can be started. Marland Kitchen Physical Assistance: Bring an adult physically capable of assisting you, in the event you need help. This adult should keep you company at home for at least 6 hours after the procedure. . Blood Pressure Medicine: Take your blood pressure medicine with a sip of water the morning of the procedure. . Blood thinners: Notify our staff if you are taking any blood thinners. Depending on which one you take, there will be specific instructions on how and when to stop it. . Diabetics on insulin: Notify the staff so that you can be scheduled 1st case in the morning. If your diabetes requires high dose insulin, take only  of your normal insulin dose the morning of the procedure and notify the staff that you have done so. . Preventing infections: Shower with an antibacterial soap the morning of your procedure. . Build-up your immune system: Take 1000 mg of Vitamin C with every meal (3 times a day) the day prior to your procedure. Marland Kitchen Antibiotics: Inform the staff if you have a condition or reason that requires you to take antibiotics before dental procedures. . Pregnancy: If you are pregnant, call and cancel the procedure. . Sickness: If you have a cold, fever, or any active infections, call and cancel the procedure. . Arrival: You must be in the facility at least 30 minutes prior to your scheduled procedure. . Children: Do not bring  children with you. . Dress appropriately: Bring dark clothing that you would not mind if they get stained. . Valuables: Do not bring any jewelry or valuables.  Reasons to call and reschedule or cancel your procedure: (Following these recommendations will minimize the risk of a serious complication.) . Surgeries: Avoid having procedures within 2 weeks of any surgery. (Avoid for 2 weeks before or after any surgery). . Flu Shots: Avoid having procedures within 2 weeks of a flu shots or . (Avoid for 2 weeks before or after immunizations). . Barium: Avoid having a procedure within 7-10 days after having had a radiological study involving the use of radiological contrast. (Myelograms, Barium swallow or enema study). . Heart attacks: Avoid any elective procedures or surgeries for the initial 6 months after a "Myocardial Infarction" (Heart Attack). . Blood thinners: It is imperative that you stop these medications before procedures. Let us know if you if you take any blood thinner.  . Infection: Avoid procedures during or within two weeks of an infection (including chest colds or gastrointestinal problems). Symptoms associated with infections include: Localized redness, fever, chills, night sweats or profuse sweating, burning sensation when voiding, cough, congestion, stuffiness, runny nose, sore throat, diarrhea, nausea, vomiting, cold or Flu symptoms, recent or current infections. It is specially important if the infection is over the area that we intend to treat. Marland Kitchen Heart and lung problems: Symptoms that may suggest an active cardiopulmonary problem include: cough, chest pain, breathing difficulties or shortness of breath, dizziness, ankle swelling, uncontrolled high or unusually low blood pressure, and/or palpitations. If you are experiencing any of these symptoms, cancel your procedure and contact  your primary care physician for an evaluation.  Remember:  Regular Business hours are:  Monday to Thursday  8:00 AM to 4:00 PM  Provider's Schedule: Milinda Pointer, MD:  Procedure days: Tuesday and Thursday 7:30 AM to 4:00 PM  Gillis Santa, MD:  Procedure days: Monday and Wednesday 7:30 AM to 4:00 PM ____________________________________________________________________________________________   ____________________________________________________________________________________________  Blood Thinners  IMPORTANT NOTICE:  If you take any of these, make sure to notify the nursing staff.  Failure to do so may result in injury.  Recommended time intervals to stop and restart blood-thinners, before & after invasive procedures  Generic Name Brand Name Stop Time. Must be stopped at least this long before procedures. After procedures, wait at least this long before re-starting.  Abciximab Reopro 15 days 2 hrs  Alteplase Activase 10 days 10 days  Anagrelide Agrylin    Apixaban Eliquis 3 days 6 hrs  Cilostazol Pletal 3 days 5 hrs  Clopidogrel Plavix 7-10 days 2 hrs  Dabigatran Pradaxa 5 days 6 hrs  Dalteparin Fragmin 24 hours 4 hrs  Dipyridamole Aggrenox 11days 2 hrs  Edoxaban [place]; Savaysa 3 days 2 hrs  Enoxaparin  Lovenox 24 hours 4 hrs  Eptifibatide Integrillin 8 hours 2 hrs  Fondaparinux  Arixtra 72 hours 12 hrs  Prasugrel Effient 7-10 days 6 hrs  Reteplase Retavase 10 days 10 days  Rivaroxaban Xarelto 3 days 6 hrs  Ticagrelor Brilinta 5-7 days 6 hrs  Ticlopidine Ticlid 10-14 days 2 hrs  Tinzaparin Innohep 24 hours 4 hrs  Tirofiban Aggrastat 8 hours 2 hrs  Warfarin Coumadin 5 days 2 hrs   Other medications with blood-thinning effects  Product indications Generic (Brand) names Note  Cholesterol Lipitor Stop 4 days before procedure  Blood thinner (injectable) Heparin (LMW or LMWH Heparin) Stop 24 hours before procedure  Cancer Ibrutinib (Imbruvica) Stop 7 days before procedure  Malaria/Rheumatoid Hydroxychloroquine (Plaquenil) Stop 11 days before procedure  Thrombolytics  10  days before or after procedures   Over-the-counter (OTC) Products with blood-thinning effects  Product Common names Stop Time  Aspirin > 325 mg Goody Powders, Excedrin, etc. 11 days  Aspirin ? 81 mg  7 days  Fish oil  4 days  Garlic supplements  7 days  Ginkgo biloba  36 hours  Ginseng  24 hours  NSAIDs Ibuprofen, Naprosyn, etc. 3 days  Vitamin E  4 days   ____________________________________________________________________________________________

## 2020-02-03 NOTE — Telephone Encounter (Signed)
Her insurance info is in the computer. She has [employer] and the Id number is [id number].

## 2020-02-04 ENCOUNTER — Telehealth: Payer: Self-pay

## 2020-02-04 ENCOUNTER — Telehealth: Payer: Self-pay | Admitting: *Deleted

## 2020-02-04 NOTE — Telephone Encounter (Signed)
Dr. Alveria Apley office called and Lovenox bridge requested. Patient instructed to call if it is not called in so that I can readdress.

## 2020-02-04 NOTE — Telephone Encounter (Signed)
[name] scheduled for a hip injection on march 30 and states we need to get approval from Dr. Nehemiah Massed to stop her coumadin. Can someone contact their office and get the approval?

## 2020-02-18 ENCOUNTER — Encounter: Payer: Self-pay | Admitting: Pain Medicine

## 2020-02-18 ENCOUNTER — Ambulatory Visit (HOSPITAL_BASED_OUTPATIENT_CLINIC_OR_DEPARTMENT_OTHER): Payer: Managed Care, Other (non HMO) | Admitting: Pain Medicine

## 2020-02-18 ENCOUNTER — Ambulatory Visit
Admission: RE | Admit: 2020-02-18 | Discharge: 2020-02-18 | Disposition: A | Discharge status: Home / Self Care | Payer: Managed Care, Other (non HMO) | Source: Ambulatory Visit | Attending: Pain Medicine | Admitting: Pain Medicine

## 2020-02-18 ENCOUNTER — Other Ambulatory Visit: Payer: Self-pay

## 2020-02-18 VITALS — BP 111/71 | HR 60 | Temp 98.3°F | Resp 20 | Ht 63.0 in | Wt 219.0 lb

## 2020-02-18 DIAGNOSIS — G8929 Other chronic pain: Secondary | ICD-10-CM

## 2020-02-18 DIAGNOSIS — M25552 Pain in left hip: Secondary | ICD-10-CM | POA: Insufficient documentation

## 2020-02-18 DIAGNOSIS — Z7901 Long term (current) use of anticoagulants: Secondary | ICD-10-CM | POA: Insufficient documentation

## 2020-02-18 DIAGNOSIS — M47817 Spondylosis without myelopathy or radiculopathy, lumbosacral region: Secondary | ICD-10-CM | POA: Insufficient documentation

## 2020-02-18 DIAGNOSIS — M160 Bilateral primary osteoarthritis of hip: Secondary | ICD-10-CM | POA: Insufficient documentation

## 2020-02-18 DIAGNOSIS — M25551 Pain in right hip: Secondary | ICD-10-CM | POA: Insufficient documentation

## 2020-02-18 MED ORDER — FENTANYL CITRATE (PF) 100 MCG/2ML IJ SOLN
INTRAMUSCULAR | Status: AC
  Filled 2020-02-18: qty 2

## 2020-02-18 MED ORDER — MIDAZOLAM HCL 5 MG/5ML IJ SOLN
1.0000 mg | INTRAMUSCULAR | Status: DC | PRN
  Administered 2020-02-18: 2 mg via INTRAVENOUS

## 2020-02-18 MED ORDER — LIDOCAINE HCL 2 % IJ SOLN
20.0000 mL | Freq: Once | INTRAMUSCULAR | Status: AC
  Administered 2020-02-18: 400 mg

## 2020-02-18 MED ORDER — FENTANYL CITRATE (PF) 100 MCG/2ML IJ SOLN
25.0000 ug | INTRAMUSCULAR | Status: DC | PRN
  Administered 2020-02-18: 50 ug via INTRAVENOUS

## 2020-02-18 MED ORDER — ROPIVACAINE HCL 2 MG/ML IJ SOLN
INTRAMUSCULAR | Status: AC
  Filled 2020-02-18: qty 10

## 2020-02-18 MED ORDER — MIDAZOLAM HCL 5 MG/5ML IJ SOLN
INTRAMUSCULAR | Status: AC
  Filled 2020-02-18: qty 5

## 2020-02-18 MED ORDER — LIDOCAINE HCL 2 % IJ SOLN
INTRAMUSCULAR | Status: AC
  Filled 2020-02-18: qty 20

## 2020-02-18 MED ORDER — LACTATED RINGERS IV SOLN
1000.0000 mL | Freq: Once | INTRAVENOUS | Status: AC
  Administered 2020-02-18: 1000 mL via INTRAVENOUS

## 2020-02-18 MED ORDER — METHYLPREDNISOLONE ACETATE 80 MG/ML IJ SUSP
80.0000 mg | Freq: Once | INTRAMUSCULAR | Status: AC
  Administered 2020-02-18: 80 mg via INTRA_ARTICULAR

## 2020-02-18 MED ORDER — ROPIVACAINE HCL 2 MG/ML IJ SOLN
9.0000 mL | Freq: Once | INTRAMUSCULAR | Status: AC
  Administered 2020-02-18: 9 mL via INTRA_ARTICULAR

## 2020-02-18 MED ORDER — METHYLPREDNISOLONE ACETATE 80 MG/ML IJ SUSP
INTRAMUSCULAR | Status: AC
  Filled 2020-02-18: qty 1

## 2020-02-18 NOTE — Patient Instructions (Signed)

## 2020-02-18 NOTE — Progress Notes (Signed)
Safety precautions to be maintained throughout the outpatient stay will include: orient to surroundings, keep bed in low position, maintain call bell within reach at all times, provide assistance with transfer out of bed and ambulation.  

## 2020-02-18 NOTE — Progress Notes (Signed)
PROVIDER NOTE: Information contained herein reflects review and annotations entered in association with encounter. Interpretation of such information and data should be left to medically-trained personnel. Information provided to patient can be located elsewhere in the medical record under "Patient Instructions". Document created using STT-dictation technology, any transcriptional errors that may result from process are unintentional.    Patient: Shelly Huynh  Service Category: Procedure  Provider: Gaspar Cola, MD  DOB: Apr 21, 1971  DOS: 02/18/2020  Location: [facility]  MRN: [id number]  Setting: Ambulatory - outpatient  Referring Provider: Milinda Pointer, MD  Type: Established Patient  Specialty: Interventional Pain Management  PCP: Tracie Harrier, MD   Primary Reason for Visit: Interventional Pain Management Treatment. CC: Hip Pain  Procedure:          Anesthesia, Analgesia, Anxiolysis:  Type: Intra-Articular Hip Injection #1  Primary Purpose: Diagnostic Region: Posterolateral hip joint area. Level: Lower pelvic and hip joint level. Target Area: Superior aspect of the hip joint cavity, going thru the superior portion of the capsular ligament. Approach: Posterolateral approach. Laterality: Bilateral  Type: Moderate (Conscious) Sedation combined with Local Anesthesia Indication(s): Analgesia and Anxiety Route: Intravenous (IV) IV Access: Secured Sedation: Meaningful verbal contact was maintained at all times during the procedure  Local Anesthetic: Lidocaine 1-2%  Position: Lateral Decubitus with bad side up Prepped Area: Entire Posterolateral hip area. Prepping solution: DuraPrep (Iodine Povacrylex [0.7% available iodine] and Isopropyl Alcohol, 74% w/w)   Indications: 1. Chronic hip pain (3ry area of Pain) (Bilateral) (L>R)   2. Osteoarthritis of hips (Bilateral)   3. Chronic anticoagulation (Coumadin)    Pain Score: Pre-procedure: 6  /10 Post-procedure: 0-No pain/10   Pre-op Assessment:  Shelly Huynh is a 49 y.o. (year old), female patient, seen today for interventional treatment. She  has a past surgical history that includes Abdominal hysterectomy; Cesarean section; Hernia repair; Cardiac valuve replacement; Esophagogastroduodenoscopy (egd) with propofol (N/A, 10/07/2016); Breast excisional biopsy (Right, 1992); Breast biopsy (Left, 07/25/2017); Breast biopsy (Right, 10/22/2019); Breast biopsy (Right, 10/22/2019); Breast lumpectomy (Right, 11/20/2019); and Breast biopsy (Right, 11/20/2019). Shelly Huynh has a current medication list which includes the following prescription(s): aimovig, albuterol, aspirin ec, biotin, candesartan, carvedilol, cholecalciferol, cyclobenzaprine, diazepam, docusate sodium, duloxetine, enoxaparin, fluticasone, gabapentin, hydrocortisone, iron polysaccharides, levalbuterol, levocetirizine, linaclotide, meclizine, minoxidil, pantoprazole, pravastatin, spironolactone, sucralfate, [START ON 02/22/2020] tramadol, amoxicillin, warfarin, and warfarin, and the following Facility-Administered Medications: fentanyl and midazolam. Her primarily concern today is the Hip Pain  Initial Vital Signs:  Pulse/HCG Rate: 68ECG Heart Rate: 63 Temp: 98.2 F (36.8 C) Resp: 13 BP: 130/79 SpO2: 100 %  BMI: Estimated body mass index is 38.79 kg/m as calculated from the following:   Height as of this encounter: 5' 3"  (1.6 m).   Weight as of this encounter: 219 lb (99.3 kg).  Risk Assessment: Allergies: Reviewed. [name] allergic to ivp dye [iodinated diagnostic agents]; sulfa antibiotics; banana; guaifenesin; hctz [hydrochlorothiazide]; losartan; povidone iodine; povidone-iodine; lisinopril; and mucinex [guaifenesin er].  Allergy Precautions: None required Coagulopathies: Reviewed. None identified.  Blood-thinner therapy: None at this time Active Infection(s): Reviewed. None identified. Shelly Huynh is afebrile  Site  Confirmation: Shelly Huynh was asked to confirm the procedure and laterality before marking the site Procedure checklist: Completed Consent: Before the procedure and under the influence of no sedative(s), amnesic(s), or anxiolytics, the patient was informed of the treatment options, risks and possible complications. To fulfill our ethical and legal obligations, as recommended by the American Medical Association's Code of Ethics, I have informed  the patient of my clinical impression; the nature and purpose of the treatment or procedure; the risks, benefits, and possible complications of the intervention; the alternatives, including doing nothing; the risk(s) and benefit(s) of the alternative treatment(s) or procedure(s); and the risk(s) and benefit(s) of doing nothing. The patient was provided information about the general risks and possible complications associated with the procedure. These may include, but are not limited to: failure to achieve desired goals, infection, bleeding, organ or nerve damage, allergic reactions, paralysis, and death. In addition, the patient was informed of those risks and complications associated to the procedure, such as failure to decrease pain; infection; bleeding; organ or nerve damage with subsequent damage to sensory, motor, and/or autonomic systems, resulting in permanent pain, numbness, and/or weakness of one or several areas of the body; allergic reactions; (i.e.: anaphylactic reaction); and/or death. Furthermore, the patient was informed of those risks and complications associated with the medications. These include, but are not limited to: allergic reactions (i.e.: anaphylactic or anaphylactoid reaction(s)); adrenal axis suppression; blood sugar elevation that in diabetics may result in ketoacidosis or comma; water retention that in patients with history of congestive heart failure may result in shortness of breath, pulmonary edema, and decompensation with resultant heart  failure; weight gain; swelling or edema; medication-induced neural toxicity; particulate matter embolism and blood vessel occlusion with resultant organ, and/or nervous system infarction; and/or aseptic necrosis of one or more joints. Finally, the patient was informed that Medicine is not an exact science; therefore, there is also the possibility of unforeseen or unpredictable risks and/or possible complications that may result in a catastrophic outcome. The patient indicated having understood very clearly. We have given the patient no guarantees and we have made no promises. Enough time was given to the patient to ask questions, all of which were answered to the patient's satisfaction. Shelly Huynh has indicated that she wanted to continue with the procedure. Attestation: I, the ordering provider, attest that I have discussed with the patient the benefits, risks, side-effects, alternatives, likelihood of achieving goals, and potential problems during recovery for the procedure that I have provided informed consent. Date  Time: 02/18/2020 10:26 AM  Pre-Procedure Preparation:  Monitoring: As per clinic protocol. Respiration, ETCO2, SpO2, BP, heart rate and rhythm monitor placed and checked for adequate function Safety Precautions: Patient was assessed for positional comfort and pressure points before starting the procedure. Time-out: I initiated and conducted the "Time-out" before starting the procedure, as per protocol. The patient was asked to participate by confirming the accuracy of the "Time Out" information. Verification of the correct person, site, and procedure were performed and confirmed by me, the nursing staff, and the patient. "Time-out" conducted as per Joint Commission's Universal Protocol (UP.01.01.01). Time: 1056  Description of Procedure:          Safety Precautions: Aspiration looking for blood return was conducted prior to all injections. At no point did we inject any substances, as a  needle was being advanced. No attempts were made at seeking any paresthesias. Safe injection practices and needle disposal techniques used. Medications properly checked for expiration dates. SDV (single dose vial) medications used. Description of the Procedure: Protocol guidelines were followed. The patient was placed in position over the fluoroscopy table. The target area was identified and the area prepped in the usual manner. Skin & deeper tissues infiltrated with local anesthetic. Appropriate amount of time allowed to pass for local anesthetics to take effect. The procedure needles were then advanced to the target area.  Proper needle placement secured. Negative aspiration confirmed. Solution injected in intermittent fashion, asking for systemic symptoms every 0.5cc of injectate. The needles were then removed and the area cleansed, making sure to leave some of the prepping solution back to take advantage of its long term bactericidal properties. Vitals:   02/18/20 1105 02/18/20 1115 02/18/20 1125 02/18/20 1135  BP: (!) 97/59 (!) 106/58 117/70 111/71  Pulse: 60     Resp: 16 (!) 21 20 20   Temp:  98.2 F (36.8 C)  98.3 F (36.8 C)  SpO2: 100% 100% 100% 100%  Weight:      Height:        Start Time: 1056 hrs. End Time: 1105 hrs. Materials:  Needle(s) Type: Spinal Needle Gauge: 22G Length: 7-in Medication(s): Please see orders for medications and dosing details.  Imaging Guidance (Non-Spinal):          Type of Imaging Technique: Fluoroscopy Guidance (Non-Spinal) Indication(s): Assistance in needle guidance and placement for procedures requiring needle placement in or near specific anatomical locations not easily accessible without such assistance. Exposure Time: Please see nurses notes. Contrast: Before injecting any contrast, we confirmed that the patient did not have an allergy to iodine, shellfish, or radiological contrast. Once satisfactory needle placement was completed at the desired  level, radiological contrast was injected. Contrast injected under live fluoroscopy. No contrast complications. See chart for type and volume of contrast used. Fluoroscopic Guidance: I was personally present during the use of fluoroscopy. "Tunnel Vision Technique" used to obtain the best possible view of the target area. Parallax error corrected before commencing the procedure. "Direction-depth-direction" technique used to introduce the needle under continuous pulsed fluoroscopy. Once target was reached, antero-posterior, oblique, and lateral fluoroscopic projection used confirm needle placement in all planes. Images permanently stored in EMR. Interpretation: I personally interpreted the imaging intraoperatively. Adequate needle placement confirmed in multiple planes. Appropriate spread of contrast into desired area was observed. No evidence of afferent or efferent intravascular uptake. Permanent images saved into the patient's record.  Antibiotic Prophylaxis:   Anti-infectives (From admission, onward)   None     Indication(s): None identified  Post-operative Assessment:  Post-procedure Vital Signs:  Pulse/HCG Rate: 6073 Temp: 98.3 F (36.8 C) Resp: 20 BP: 111/71 SpO2: 100 %  EBL: None  Complications: No immediate post-treatment complications observed by team, or reported by patient.  Note: The patient tolerated the entire procedure well. A repeat set of vitals were taken after the procedure and the patient was kept under observation following institutional policy, for this type of procedure. Post-procedural neurological assessment was performed, showing return to baseline, prior to discharge. The patient was provided with post-procedure discharge instructions, including a section on how to identify potential problems. Should any problems arise concerning this procedure, the patient was given instructions to immediately contact us, at any time, without hesitation. In any case, we plan to  contact the patient by telephone for a follow-up status report regarding this interventional procedure.  Comments:  No additional relevant information.  Plan of Care  Orders:  Orders Placed This Encounter  Procedures  . HIP INJECTION    Scheduling Instructions:     Side: Bilateral     Sedation: With Sedation.     Timeframe: Today  . [facility] C-ARM 1-60 MIN NO REPORT    Intraoperative interpretation by procedural physician at [facility].    Standing Status:   Standing    Number of Occurrences:   1    Order  Specific Question:   Reason for exam:    Answer:   Assistance in needle guidance and placement for procedures requiring needle placement in or near specific anatomical locations not easily accessible without such assistance.  . Informed Consent Details: Physician/Practitioner Attestation; Transcribe to consent form and obtain patient signature    Nursing Order: Transcribe to consent form and obtain patient signature. Note: Always confirm laterality of pain with Shelly Huynh, before procedure. Procedure: Hip injection Indication/Reason: Hip Joint Pain (Arthralgia) Provider Attestation: I, Buckner Dossie Arbour, MD, (Pain Management Specialist), the physician/practitioner, attest that I have discussed with the patient the benefits, risks, side effects, alternatives, likelihood of achieving goals and potential problems during recovery for the procedure that I have provided informed consent.  . Provide equipment / supplies at bedside    Equipment required: Single use, disposable, "Block Tray"    Standing Status:   Standing    Number of Occurrences:   1    Order Specific Question:   Specify    Answer:   Block Tray  . Care order/instruction: Please confirm that the patient has stopped the Coumadin (Warfarin) X 5 days prior to procedure or surgery.    Please confirm that the patient has stopped the Coumadin (Warfarin) X 5 days prior to procedure or surgery.    Standing  Status:   Standing    Number of Occurrences:   1  . Bleeding precautions    Standing Status:   Standing    Number of Occurrences:   1   Chronic Opioid Analgesic:  Tramadol 50 mg, 1 tab PO q 6 hrs (200 mg/day of tramadol) Highest recorded MME/day: 30 mg/day MME/day: 20 mg/day   Medications ordered for procedure: Meds ordered this encounter  Medications  . lidocaine (XYLOCAINE) 2 % (with pres) injection 400 mg  . lactated ringers infusion 1,000 mL  . midazolam (VERSED) 5 MG/5ML injection 1-2 mg    Make sure Flumazenil is available in the pyxis when using this medication. If oversedation occurs, administer 0.2 mg IV over 15 sec. If after 45 sec no response, administer 0.2 mg again over 1 min; may repeat at 1 min intervals; not to exceed 4 doses (1 mg)  . fentaNYL (SUBLIMAZE) injection 25-50 mcg    Make sure Narcan is available in the pyxis when using this medication. In the event of respiratory depression (RR< 8/min): Titrate NARCAN (naloxone) in increments of 0.1 to 0.2 mg IV at 2-3 minute intervals, until desired degree of reversal.  . ropivacaine (PF) 2 mg/mL (0.2%) (NAROPIN) injection 9 mL  . methylPREDNISolone acetate (DEPO-MEDROL) injection 80 mg   Medications administered: We administered lidocaine, lactated ringers, midazolam, fentaNYL, ropivacaine (PF) 2 mg/mL (0.2%), and methylPREDNISolone acetate.  See the medical record for exact dosing, route, and time of administration.  Follow-up plan:   Return in about 2 weeks (around 03/03/2020) for (VV), (PP).       Interventional management options: Planned, scheduled, and/or pending:   NOTE: COUMADIN ANTICOAGULATION (Lovenox bridge therapy by Dr. Nehemiah Huynh) Diagnostic bilateral IA hip injection #1    Considering:   Diagnostic L4-5 LESI  Diagnostic bilateral L5 TFESI    PRN Procedures:   Diagnostic bilateral lumbar facet block #2     Recent Visits Date Type Provider Dept  02/03/20 Telemedicine Milinda Pointer, [facility]  01/16/20 Procedure visit Milinda Pointer, [facility]  12/30/19 Office Visit Milinda Pointer, [facility]  12/16/19 Office Visit Milinda Pointer, MD [facility]  Showing recent visits within past 90 days and meeting all other requirements   Today's Visits Date Type Provider Dept  02/18/20 Procedure visit Milinda Pointer, MD [facility]  Showing today's visits and meeting all other requirements   Future Appointments Date Type Provider Dept  03/17/20 Appointment Milinda Pointer, MD [facility]  Showing future appointments within next 90 days and meeting all other requirements   Disposition: Discharge home  Discharge (Date  Time): 02/18/2020; 1139 hrs.   Primary Care Physician: Tracie Harrier, MD Location: [facility] Note by: Gaspar Cola, MD Date: 02/18/2020; Time: 12:50 PM  Disclaimer:  Medicine is not an Chief Strategy Officer. The only guarantee in medicine is that nothing is guaranteed. It is important to note that the decision to proceed with this intervention was based on the information collected from the patient. The Data and conclusions were drawn from the patient's questionnaire, the interview, and the physical examination. Because the information was provided in large part by the patient, it cannot be guaranteed that it has not been purposely or unconsciously manipulated. Every effort has been made to obtain as much relevant data as possible for this evaluation. It is important to note that the conclusions that lead to this procedure are derived in large part from the available data. Always take into account that the treatment will also be dependent on availability of resources and existing treatment guidelines, considered by other Pain Management Practitioners as being common knowledge and practice, at the time of the intervention. For Medico-Legal purposes, it is also  important to point out that variation in procedural techniques and pharmacological choices are the acceptable norm. The indications, contraindications, technique, and results of the above procedure should only be interpreted and judged by a Board-Certified Interventional Pain Specialist with extensive familiarity and expertise in the same exact procedure and technique.

## 2020-02-19 ENCOUNTER — Telehealth: Payer: Self-pay

## 2020-02-19 NOTE — Telephone Encounter (Signed)
Post procedure phone call.  States she is doing [place]  But she is sore.  Instructed to put heat on it today.

## 2020-03-02 ENCOUNTER — Ambulatory Visit: Payer: Managed Care, Other (non HMO) | Admitting: Urology

## 2020-03-02 ENCOUNTER — Other Ambulatory Visit: Payer: Self-pay

## 2020-03-02 ENCOUNTER — Encounter: Payer: Self-pay | Admitting: Urology

## 2020-03-02 VITALS — BP 108/74 | HR 82 | Ht 63.0 in | Wt 221.0 lb

## 2020-03-02 DIAGNOSIS — R310 Gross hematuria: Secondary | ICD-10-CM

## 2020-03-02 DIAGNOSIS — R319 Hematuria, unspecified: Secondary | ICD-10-CM | POA: Diagnosis not present

## 2020-03-02 LAB — URINALYSIS, COMPLETE
Bilirubin, UA: NEGATIVE
Glucose, UA: NEGATIVE
Ketones, UA: NEGATIVE
Leukocytes,UA: NEGATIVE
Nitrite, UA: NEGATIVE
Protein,UA: NEGATIVE
Specific Gravity, UA: 1.02 (ref 1.005–1.030)
Urobilinogen, Ur: 1 mg/dL (ref 0.2–1.0)
pH, UA: 5 (ref 5.0–7.5)

## 2020-03-02 LAB — MICROSCOPIC EXAMINATION: RBC, Urine: NONE SEEN /hpf (ref 0–2)

## 2020-03-02 NOTE — Patient Instructions (Signed)

## 2020-03-02 NOTE — Progress Notes (Signed)
03/02/2020 9:27 AM   Shelly Huynh 04-19-1971 [id number]  Referring provider: Tracie Harrier, MD [address] [facility] [place],  [place] [address]  Chief Complaint  Patient presents with  . Hematuria    HPI: A few weeks ago patient had painless gross hematuria.  3 days prior she said she was tested and did not have a bladder infection.  She thinks he gets 1 or 2 infections a year.  She takes daily aspirin and Coumadin and has a smoking history.  She believes she has been told she has microscopic hematuria  She has nonspecific abdominal pain in all 4 quadrants.  It is intermittent.  At baseline she sometimes leaks with urgency and coughing and wears 1-3 pads a day that are damp.  She denies enuresis.  She voids every 1-2 hours and takes a diuretic.  Gets up once a night  No history of kidney stones previous to surgery.  She had a limited gallbladder ultrasound September 06, 2019  Urine negative for blood with few bacteria.  No recent urine culture in medical record.  Urine sent for culture     PMH: Past Medical History:  Diagnosis Date  . Acid reflux   . Anxiety   . Arthritis   . CHF (congestive heart failure) ([place])   . Depression   . Diabetes mellitus without complication ([place])   . Heart valve replaced   . High cholesterol   . High cholesterol   . Hypertension   . Hypokalemia   . IBS (irritable bowel syndrome)   . Sciatic pain     Surgical History: Past Surgical History:  Procedure Laterality Date  . ABDOMINAL HYSTERECTOMY    . BREAST BIOPSY Left 07/25/2017   BENIGN BREAST TISSUE WITH MICROCALCIFICATIONS  . BREAST BIOPSY Right 10/22/2019   Affirm Bx- X-clip- path pending  . BREAST BIOPSY Right 10/22/2019   Affirm Bx- Ribbon clip- path pending  . BREAST BIOPSY Right 11/20/2019   Procedure: BREAST BIOPSY WITH NEEDLE LOCALIZATION x 2;  Surgeon: Herbert Pun, MD;  Location: [facility] ORS;  Service: General;  Laterality: Right;  .  BREAST EXCISIONAL BIOPSY Right 1992   NEG  . BREAST LUMPECTOMY Right 11/20/2019   complex sclerosing lesion x 2  . CARDIAC VALVE SURGERY    . CESAREAN SECTION    . ESOPHAGOGASTRODUODENOSCOPY (EGD) WITH PROPOFOL N/A 10/07/2016   Procedure: ESOPHAGOGASTRODUODENOSCOPY (EGD) WITH PROPOFOL;  Surgeon: Jonathon Bellows, MD;  Location: [facility] ENDOSCOPY;  Service: Endoscopy;  Laterality: N/A;  . HERNIA REPAIR      Home Medications:  Allergies as of 03/02/2020      Reactions   Ivp Dye [iodinated Diagnostic Agents] Anaphylaxis, Hives   Especially CT contrast media; tightness in chest   Sulfa Antibiotics Hives, Anaphylaxis   Banana Itching   Guaifenesin Hives   Hctz [hydrochlorothiazide] Hives   Losartan Itching   Povidone Iodine Hives   Povidone-iodine Hives   Lisinopril Hives, Rash   Mucinex [guaifenesin Er] Hives, Rash      Medication List       Accurate as of March 02, 2020  9:27 AM. If you have any questions, ask your nurse or doctor.        Aimovig 140 MG/ML Soaj Generic drug: Erenumab-aooe Inject 140 mg into the skin every 28 (twenty-eight) days.   amoxicillin 500 MG capsule Commonly known as: AMOXIL Take 2,000 mg by mouth as needed (for dental procedure).   aspirin EC 81 MG tablet Take 81 mg  daily by mouth.   Biotin 5000 MCG Tabs Take 1 tablet daily by mouth.   candesartan 16 MG tablet Commonly known as: ATACAND Take 16 mg by mouth daily.   carvedilol 6.25 MG tablet Commonly known as: COREG Take 6.25 mg by mouth 2 (two) times daily.   Cholecalciferol 25 MCG (1000 UT) tablet Take by mouth.   cyclobenzaprine 10 MG tablet Commonly known as: FLEXERIL Take 10 mg by mouth 3 (three) times daily as needed for muscle spasms.   diazepam 5 MG tablet Commonly known as: VALIUM Take 5 mg by mouth every 8 (eight) hours as needed for anxiety or muscle spasms.   docusate sodium 100 MG capsule Commonly known as: COLACE Take 100 mg by mouth daily as needed for mild  constipation.   DULoxetine 30 MG capsule Commonly known as: CYMBALTA Take 30 mg by mouth at bedtime.   enoxaparin 40 MG/0.4ML injection Commonly known as: LOVENOX Inject 40 mg into the skin every 12 (twelve) hours.   Ferrex 150 150 MG capsule Generic drug: iron polysaccharides Take 150 mg by mouth daily.   fluticasone 50 MCG/ACT nasal spray Commonly known as: FLONASE Place 2 sprays into both nostrils daily as needed for allergies or rhinitis.   gabapentin 300 MG capsule Commonly known as: NEURONTIN Take 1 capsule (300 mg total) by mouth at bedtime.   hydrocortisone 2.5 % cream Apply 1 application topically 2 (two) times a week.   levalbuterol 1.25 MG/0.5ML nebulizer solution Commonly known as: XOPENEX Take 1.25 mg by nebulization every 4 (four) hours as needed for wheezing or shortness of breath.   levocetirizine 5 MG tablet Commonly known as: XYZAL Take 5 mg by mouth every evening.   linaclotide 290 MCG Caps capsule Commonly known as: LINZESS Take 290 mcg by mouth daily as needed (constipation).   meclizine 25 MG tablet Commonly known as: ANTIVERT Take 25 mg by mouth 3 (three) times daily as needed for dizziness.   Minoxidil 5 % Foam Apply 1 mL topically daily.   pantoprazole 40 MG tablet Commonly known as: PROTONIX Take 40 mg by mouth 2 (two) times daily.   pravastatin 20 MG tablet Commonly known as: PRAVACHOL Take 20 mg by mouth daily.   ProAir HFA 108 (90 Base) MCG/ACT inhaler Generic drug: albuterol Inhale 2 puffs into the lungs every 4 (four) hours as needed for wheezing or shortness of breath.   spironolactone 50 MG tablet Commonly known as: ALDACTONE Take 75 mg by mouth daily.   sucralfate 1 g tablet Commonly known as: CARAFATE Take 1 g by mouth 3 (three) times daily.   traMADol 50 MG tablet Commonly known as: ULTRAM Take 1 tablet (50 mg total) by mouth daily as needed for severe pain. Must last 30 days   warfarin 6 MG tablet Commonly known  as: COUMADIN Take 6 mg by mouth See admin instructions. Mon and Tues, WED   warfarin 7.5 MG tablet Commonly known as: COUMADIN Take 1 tablet (7.5 mg total) one time only at 6 PM by mouth.       Allergies:  Allergies  Allergen Reactions  . Ivp Dye [Iodinated Diagnostic Agents] Anaphylaxis and Hives    Especially CT contrast media; tightness in chest  . Sulfa Antibiotics Hives and Anaphylaxis  . Banana Itching  . Guaifenesin Hives  . Hctz [Hydrochlorothiazide] Hives  . Losartan Itching  . Povidone Iodine Hives  . Povidone-Iodine Hives  . Lisinopril Hives and Rash  . Mucinex [Guaifenesin Er] Hives and Rash  Family History: Family History  Problem Relation Age of Onset  . Breast cancer Paternal Grandmother     Social History:  reports that she quit smoking about 4 years ago. She has never used smokeless tobacco. She reports that she does not drink alcohol or use drugs.  ROS:                                        Physical Exam: There were no vitals taken for this visit.  Constitutional:  Alert and oriented, No acute distress. HEENT: [place] AT, moist mucus membranes.  Trachea midline, no masses. Cardiovascular: No clubbing, cyanosis, or edema. Respiratory: Normal respiratory effort, no increased work of breathing. GI: Abdomen is soft, nontender, nondistended, no abdominal masses GU: No CVA tenderness.  No abdominal tenderness Skin: No rashes, bruises or suspicious lesions. Lymph: No cervical or inguinal adenopathy. Neurologic: Grossly intact, no focal deficits, moving all 4 extremities. Psychiatric: Normal mood and affect.  Laboratory Data: Lab Results  Component Value Date   WBC 8.8 10/04/2017   HGB 11.2 (L) 10/04/2017   HCT 34.8 (L) 10/04/2017   MCV 88.1 10/04/2017   PLT 424 10/04/2017    Lab Results  Component Value Date   CREATININE 0.73 12/23/2019    No results found for: PSA  No results found for: TESTOSTERONE  No results  found for: HGBA1C  Urinalysis    Component Value Date/Time   COLORURINE YELLOW 06/14/2014 [place] 06/14/2014 1714   APPEARANCEUR Clear 08/29/2013 1130   LABSPEC 1.020 06/14/2014 1714   LABSPEC 1.005 08/29/2013 1130   PHURINE 6.0 06/14/2014 1714   GLUCOSEU NEGATIVE 06/14/2014 1714   GLUCOSEU Negative 08/29/2013 1130   HGBUR NEGATIVE 06/14/2014 1714   BILIRUBINUR NEGATIVE 06/14/2014 1714   BILIRUBINUR Negative 08/29/2013 1130   KETONESUR NEGATIVE 06/14/2014 1714   PROTEINUR NEGATIVE 06/14/2014 1714   UROBILINOGEN 1.0 06/14/2014 1714   NITRITE NEGATIVE 06/14/2014 1714   LEUKOCYTESUR NEGATIVE 06/14/2014 1714   LEUKOCYTESUR Negative 08/29/2013 1130    Pertinent Imaging:   Assessment & Plan: Hematuria CT scan ordered.  Return for pelvic and cystoscopy Patient has mild mixed incontinence.  She has a smoking history.  [name] on Coumadin.  She has occasional bladder infections.  Her diffuse abdominal discomfort probably not related to the genitourinary system but we can make comment regarding this following  1. Hematuria, unspecified type  - Urinalysis, Complete - CULTURE, URINE COMPREHENSIVE   No follow-ups on file.  Reece Packer, MD  [facility] [address] [place], [place] [address] [phone]

## 2020-03-06 LAB — CULTURE, URINE COMPREHENSIVE

## 2020-03-09 ENCOUNTER — Telehealth: Payer: Self-pay | Admitting: Pain Medicine

## 2020-03-09 NOTE — Telephone Encounter (Signed)
Patient advised to speak with Dr. Dossie Arbour about this at appt on 03-17-20.

## 2020-03-09 NOTE — Telephone Encounter (Addendum)
Patient states her insurance will only pay for 90 day script of gabapentin. Can this please be changed. States she spoke with Dr. Dossie Arbour at her last appt.  She will be out of gabapentin by Monday. Also the tramadol is not helping her pain. Please call patient to discuss

## 2020-03-16 ENCOUNTER — Telehealth: Payer: Self-pay | Admitting: *Deleted

## 2020-03-16 NOTE — Progress Notes (Signed)
Pain relief after procedure (treated area only): (Questions asked to patient) 1. Starting about 15 minutes after the procedure, and "while the area was still numb" (from the local anesthetics), were you having any of your usual pain "in that area" (the treated area)?  (NOTE: NOT including the discomfort from the needle sticks.) First 1 hour: 100 % better. First 4-6 hours: 100% better. 2. How long did the numbness from the local anesthetics last? (More than 6 hours?) Duration: 12 hours.  3. How much better is your pain now, when compared to before the procedure? Current benefit: 0 % better. 4. Can you move better now? Improvement in ROM (Range of Motion): No. 5. Can you do more now? Improvement in function: No. 4. Did you have any problems with the procedure? Side-effects/Complications: No.

## 2020-03-16 NOTE — Progress Notes (Signed)
patient: Shelly Huynh  Service Category: E/M  Provider: Gaspar Cola, MD  DOB: 18-Jan-1971  DOS: 03/17/2020  Location: Office  MRN: [id number]  Setting: Ambulatory outpatient  Referring Provider: Tracie Harrier, MD  Type: Established Patient  Specialty: Interventional Pain Management  PCP: Tracie Harrier, MD  Location: Remote location  Delivery: TeleHealth     Virtual Encounter - Pain Management PROVIDER NOTE: Information contained herein reflects review and annotations entered in association with encounter. Interpretation of such information and data should be left to medically-trained personnel. Information provided to patient can be located elsewhere in the medical record under "Patient Instructions". Document created using STT-dictation technology, any transcriptional errors that may result from process are unintentional.    Contact & Pharmacy Preferred: [phone] Home: [phone] (home) Mobile: [phone] (mobile) E-mail: [email or web address] [place] [place] [address] Phone: [phone] Fax: [phone]   Pre-screening  Shelly Huynh offered "in-person" vs "virtual" encounter. She indicated preferring virtual for this encounter.   Reason COVID-19*  Social distancing based on CDC and AMA recommendations.   I contacted Shelly Huynh on 03/17/2020 via telephone.      I clearly identified myself as Gaspar Cola, MD. I verified that I was speaking with the correct person using two identifiers (Name: Shelly Huynh, and date of birth: September 05, 1971).  Consent I sought verbal advanced consent from Shelly Huynh for virtual visit interactions. I informed Shelly Huynh of possible security and privacy concerns, risks, and limitations associated with providing "not-in-person" medical evaluation and management services. I also informed Shelly Huynh  of the availability of "in-person" appointments. Finally, I informed her that there would be a charge for the virtual visit and that she could be  personally, fully or partially, financially responsible for it. Shelly Huynh expressed understanding and agreed to proceed.   Historic Elements   Shelly Huynh is a 49 y.o. year old, female patient evaluated today after her last contact with our practice on 03/16/2020. Shelly Huynh  has a past medical history of Acid reflux, Anxiety, Arthritis, CHF (congestive heart failure) ([place]), Depression, Diabetes mellitus without complication ([place]), Heart valve replaced, High cholesterol, High cholesterol, Hypertension, Hypokalemia, IBS (irritable bowel syndrome), and Sciatic pain. She also  has a past surgical history that includes Abdominal hysterectomy; Cesarean section; Hernia repair; Cardiac valuve replacement; Esophagogastroduodenoscopy (egd) with propofol (N/A, 10/07/2016); Breast excisional biopsy (Right, 1992); Breast biopsy (Left, 07/25/2017); Breast biopsy (Right, 10/22/2019); Breast biopsy (Right, 10/22/2019); Breast lumpectomy (Right, 11/20/2019); and Breast biopsy (Right, 11/20/2019). Shelly Huynh has a current medication list which includes the following prescription(s): aimovig, albuterol, amoxicillin, aspirin ec, biotin, candesartan, carvedilol, cholecalciferol, cyclobenzaprine, diazepam, docusate sodium, duloxetine, enoxaparin, fluticasone, gabapentin, hydrocortisone, iron polysaccharides, levalbuterol, levocetirizine, linaclotide, meclizine, minoxidil, pantoprazole, pravastatin, spironolactone, sucralfate, warfarin, warfarin, and hydrocodone-acetaminophen. She  reports that she quit smoking about 5 years ago. She has never used smokeless tobacco. She reports that she does not drink alcohol or use drugs. Shelly Huynh is allergic to ivp dye [iodinated diagnostic agents]; sulfa antibiotics; banana; guaifenesin; hctz [hydrochlorothiazide]; losartan; povidone iodine;  povidone-iodine; lisinopril; and mucinex [guaifenesin er].   HPI  Today, she is being contacted for both, medication management and a post-procedure assessment.  Based on the results of this diagnostic bilateral intra-articular hip joint injection and the diagnostic x-rays done before the procedure, it is very likely that the patient's buttocks pain he  is really not the hip joint pain as she thinks.  This is more likely to be referred pain from the facet joints.  She does have x-rays of the lumbar spine which show that she does have osteoarthritis of the hip joints, bilaterally, at the L4-5 and L5-S1 region.  Not only that, but the diagnostic bilateral lumbar facet block done, provided the patient with better and longer lasting relief of the hip pain compared to the diagnostic bilateral hip joint injection.  Today we talked about these results and she agrees with my assessment.  In view of this, today we are adjusting the plan of care so asked to do a second diagnostic lumbar facet block under fluoroscopic guidance and IV sedation to see again if she gets better relief of the pain in the area that she calls to hip.  If she gets better relief, then the next step will be to do a radiofrequency ablation for the purpose of obtaining longer lasting benefit.  Complicating this case is the fact that this patient has a prosthetic cardiac valve and she is on long-term Coumadin.  She cannot come off of the Coumadin for 5 days but her cardiologist, Dr. Nehemiah Huynh has agreed to help [place] out with providing Lovenox bridge for these diagnostic procedures.  We will be contacting him so that he can make the necessary arrangements for her to have that therapy done for this next set of diagnostic lumbar facet blocks.  In addition to this, today we took the opportunity to fix the issue that she has been experiencing with the gabapentin where her insurance did not want to pay for a 90-day supply of the gabapentin when divided in 3  30 days refills.  Instead they insist on having a one-time fill for a supply containing enough medication for 90 days.  Today I had to send a new prescription to her pharmacy to reflect this.  In addition to this, the patient has been taking tramadol 50 mg half a tablet p.o. at bedtime but she indicates that this is not enough to control her pain.  In addition, she indicates that when she attempts to take the whole pill she gets nauseous.  Today we looked at some other alternatives and she has indicated that she is able to tolerate the hydrocodone, since she took it after some of her surgeries.  In view of this, today I have discontinued her tramadol and provided her with a prescription for hydrocodone/APAP 5/325 to be taken 1 tablet p.o. at bedtime #30.  Post-Procedure Evaluation  Procedure (03/09/2020): Diagnostic bilateral intra-articular hip joint injection #1 under fluoroscopic guidance and IV sedation Pre-procedure pain level:  6/10 Post-procedure: 0/10 (100% relief)  Sedation: Sedation provided.  Dewayne Shorter, RN  03/16/2020 11:11 AM  Signed Pain relief after procedure (treated area only): (Questions asked to patient) 1. Starting about 15 minutes after the procedure, and "while the area was still numb" (from the local anesthetics), were you having any of your usual pain "in that area" (the treated area)?  (NOTE: NOT including the discomfort from the needle sticks.) First 1 hour: 100 % better. First 4-6 hours: 100% better. 2. How long did the numbness from the local anesthetics last? (More than 6 hours?) Duration: 12 hours.  3. How much better is your pain now, when compared to before the procedure? Current benefit: 0 % better. 4. Can you move better now? Improvement in ROM (Range of Motion): No. 5. Can you do more now? Improvement  in function: No. 4. Did you have any problems with the procedure? Side-effects/Complications: No.  Current benefits: Defined as benefit that persist at this  time.   Analgesia:  Back to baseline Function: No improvement ROM: No improvement  Pharmacotherapy Assessment  Analgesic: Tramadol 50 mg, 1 tab PO q 6 hrs (200 mg/day of tramadol) Highest recorded MME/day: 30 mg/day MME/day: 20 mg/day   Monitoring: [place] PMP: PDMP reviewed during this encounter.       Pharmacotherapy: No side-effects or adverse reactions reported. Compliance: No problems identified. Effectiveness: Clinically acceptable. Plan: Refer to "POC".  UDS: No results found for: SUMMARY Laboratory Chemistry Profile   Renal Lab Results  Component Value Date   BUN 7 12/23/2019   CREATININE 0.73 12/23/2019   GFRAA >60 12/23/2019   GFRNONAA >60 12/23/2019     Hepatic Lab Results  Component Value Date   AST 19 12/23/2019   ALT 14 12/23/2019   ALBUMIN 4.2 12/23/2019   ALKPHOS 65 12/23/2019   LIPASE 33 06/14/2014     Electrolytes Lab Results  Component Value Date   NA 138 12/23/2019   K 3.9 12/23/2019   CL 107 12/23/2019   CALCIUM 9.2 12/23/2019   MG 2.2 12/23/2019   PHOS 3.6 03/05/2015     Bone Lab Results  Component Value Date   VD25OH 48.2 12/23/2019     Inflammation (CRP: Acute Phase) (ESR: Chronic Phase) Lab Results  Component Value Date   CRP 0.8 12/23/2019   ESRSEDRATE 30 (H) 12/23/2019   LATICACIDVEN 0.9 06/12/2017       Note: Above Lab results reviewed.  Imaging  DG PAIN CLINIC C-ARM 1-60 MIN NO REPORT Fluoro was used, but no Radiologist interpretation will be provided.  Please refer to "NOTES" tab for provider progress note.  Assessment  The primary encounter diagnosis was Chronic pain syndrome. Diagnoses of Chronic low back pain (1ry area of Pain) (Bilateral) (L>R) w/o sciatica, Chronic lower extremity pain (2ry area of Pain) (Bilateral) (L>R), Chronic hip pain (3ry area of Pain) (Bilateral) (L>R), Chronic knee pain (4th area of Pain) (Bilateral) (L>R), Chronic neck pain (5th area of Pain) (Bilateral) (L>R), Cervicogenic headache (6th area  of Pain) (Bilateral), Chronic upper extremity pain (7th area of Pain) (Bilateral) (R>L), Neurogenic pain, Chronic anticoagulation (Coumadin), and H/O prosthetic heart valve were also pertinent to this visit.  Plan of Care  Problem-specific:  No problem-specific Assessment & Plan notes found for this encounter.  Shelly Huynh has a current medication list which includes the following long-term medication(s): albuterol, candesartan, carvedilol, duloxetine, gabapentin, iron polysaccharides, levocetirizine, linaclotide, pantoprazole, spironolactone, sucralfate, warfarin, warfarin, and hydrocodone-acetaminophen.  Pharmacotherapy (Medications Ordered): Meds ordered this encounter  Medications  . gabapentin (NEURONTIN) 300 MG capsule    Sig: Take 1 capsule (300 mg total) by mouth at bedtime.    Dispense:  90 capsule    Refill:  0    Fill one day early if pharmacy is closed on scheduled refill date. May substitute for generic if available.  Marland Kitchen HYDROcodone-acetaminophen (NORCO/VICODIN) 5-325 MG tablet    Sig: Take 1 tablet by mouth at bedtime as needed for severe pain. Must last 30 days.    Dispense:  30 tablet    Refill:  0    Chronic Pain. (STOP Act - Not applicable). Fill one day early if closed on scheduled refill date. Do not fill until: 03/17/2020. To last until: 04/16/2020.   Orders:  Orders Placed This Encounter  Procedures  . LUMBAR FACET(MEDIAL BRANCH  NERVE BLOCK) MBNB    Standing Status:   Future    Standing Expiration Date:   04/16/2020    Scheduling Instructions:     Procedure: Lumbar facet block (AKA.: Lumbosacral medial branch nerve block)     Side: Bilateral     Level: L3-4, L4-5, & L5-S1 Facets (L2, L3, L4, L5, & S1 Medial Branch Nerves)     Sedation: Patient's choice.     Timeframe: ASAA    Order Specific Question:   Where will this procedure be performed?    Answer:   [facility]  . Blood Thinner Instructions to Nursing    If the patient requires a  Lovenox-bridge therapy, make sure arrangements are made to institute it with the assistance of the PCP.    Standing Status:   Standing    Number of Occurrences:   36    Standing Expiration Date:   09/16/2021    Scheduling Instructions:     This patient is taking Coumadin as an anticoagulant for her prosthetic heart valve.  She can never come off of the Coumadin without having a Lovenox bridge therapy.  Her cardiologist is Dr. Nehemiah Huynh in he is in charge of doing the patient's bridge therapies prior to procedures.  Please contact him before any procedures so that he can provide her with the Lovenox and instructions on how to use it.  . Blood Thinner Instructions to Nursing    Scheduling Instructions:     Please go ahead and contact Dr. Nehemiah Huynh to make arrangements for the patient to be switched from her Coumadin to a Lovenox bridge which will need to be stopped prior to her diagnostic lumbar facet block #2.   Follow-up plan:   Return for Procedure (w/ sedation): (B) L-FCT Blk #2, (Blood-thinner Protocol).      Interventional management options: Planned, scheduled, and/or pending:   NOTE: COUMADIN ANTICOAGULATION (Lovenox bridge therapy by Dr. Nehemiah Huynh)    Considering:   Diagnostic L4-5 LESI  Diagnostic bilateral L5 TFESI    PRN Procedures:   Diagnostic bilateral lumbar facet block #2  Diagnostic bilateral IA hip injection (100/100/0) (hip pain does not appear to be actually coming from the hip joint)    Recent Visits Date Type Provider Dept  02/18/20 Procedure visit Milinda Pointer, [facility]  02/03/20 Telemedicine Milinda Pointer, MD [facility]  01/16/20 Procedure visit Milinda Pointer, [facility]  12/30/19 Office Visit Milinda Pointer, MD [facility]  Showing recent visits within past 90 days and meeting all other requirements   Future Appointments No visits were found meeting these conditions.  Showing future  appointments within next 90 days and meeting all other requirements   I discussed the assessment and treatment plan with the patient. The patient was provided an opportunity to ask questions and all were answered. The patient agreed with the plan and demonstrated an understanding of the instructions.  Patient advised to call back or seek an in-person evaluation if the symptoms or condition worsens.  Duration of encounter: 20 minutes.  Note by: Gaspar Cola, MD Date: 03/17/2020; Time: 3:19 PM

## 2020-03-17 ENCOUNTER — Ambulatory Visit: Payer: Managed Care, Other (non HMO) | Attending: Pain Medicine | Admitting: Pain Medicine

## 2020-03-17 ENCOUNTER — Other Ambulatory Visit: Payer: Self-pay

## 2020-03-17 DIAGNOSIS — M79604 Pain in right leg: Secondary | ICD-10-CM

## 2020-03-17 DIAGNOSIS — M25561 Pain in right knee: Secondary | ICD-10-CM

## 2020-03-17 DIAGNOSIS — G894 Chronic pain syndrome: Secondary | ICD-10-CM

## 2020-03-17 DIAGNOSIS — M542 Cervicalgia: Secondary | ICD-10-CM

## 2020-03-17 DIAGNOSIS — M792 Neuralgia and neuritis, unspecified: Secondary | ICD-10-CM

## 2020-03-17 DIAGNOSIS — M25562 Pain in left knee: Secondary | ICD-10-CM

## 2020-03-17 DIAGNOSIS — M79605 Pain in left leg: Secondary | ICD-10-CM

## 2020-03-17 DIAGNOSIS — G4486 Cervicogenic headache: Secondary | ICD-10-CM

## 2020-03-17 DIAGNOSIS — M79601 Pain in right arm: Secondary | ICD-10-CM

## 2020-03-17 DIAGNOSIS — M79602 Pain in left arm: Secondary | ICD-10-CM

## 2020-03-17 DIAGNOSIS — M25551 Pain in right hip: Secondary | ICD-10-CM | POA: Diagnosis not present

## 2020-03-17 DIAGNOSIS — Z7901 Long term (current) use of anticoagulants: Secondary | ICD-10-CM

## 2020-03-17 DIAGNOSIS — M545 Low back pain: Secondary | ICD-10-CM

## 2020-03-17 DIAGNOSIS — R519 Headache, unspecified: Secondary | ICD-10-CM

## 2020-03-17 DIAGNOSIS — G8929 Other chronic pain: Secondary | ICD-10-CM

## 2020-03-17 DIAGNOSIS — M25552 Pain in left hip: Secondary | ICD-10-CM

## 2020-03-17 DIAGNOSIS — Z952 Presence of prosthetic heart valve: Secondary | ICD-10-CM

## 2020-03-17 MED ORDER — HYDROCODONE-ACETAMINOPHEN 5-325 MG PO TABS: 1.0000 | ORAL_TABLET | Freq: Every evening | ORAL | 0 refills | Status: DC | PRN

## 2020-03-17 MED ORDER — GABAPENTIN 300 MG PO CAPS: 300.0000 mg | ORAL_CAPSULE | Freq: Every day | ORAL | 0 refills | Status: DC

## 2020-03-17 NOTE — Patient Instructions (Signed)
____________________________________________________________________________________________  Preparing for Procedure with Sedation  Procedure appointments are limited to planned procedures: . No Prescription Refills. . No disability issues will be discussed. . No medication changes will be discussed.  Instructions: . Oral Intake: Do not eat or drink anything for at least 3 hours prior to your procedure. (Exception: Blood Pressure Medication. See below.) . Transportation: Unless otherwise stated by your physician, you may drive yourself after the procedure. . Blood Pressure Medicine: Do not forget to take your blood pressure medicine with a sip of water the morning of the procedure. If your Diastolic (lower reading)is above 100 mmHg, elective cases will be cancelled/rescheduled. . Blood thinners: These will need to be stopped for procedures. Notify our staff if you are taking any blood thinners. Depending on which one you take, there will be specific instructions on how and when to stop it. . Diabetics on insulin: Notify the staff so that you can be scheduled 1st case in the morning. If your diabetes requires high dose insulin, take only  of your normal insulin dose the morning of the procedure and notify the staff that you have done so. . Preventing infections: Shower with an antibacterial soap the morning of your procedure. . Build-up your immune system: Take 1000 mg of Vitamin C with every meal (3 times a day) the day prior to your procedure. Marland Kitchen Antibiotics: Inform the staff if you have a condition or reason that requires you to take antibiotics before dental procedures. . Pregnancy: If you are pregnant, call and cancel the procedure. . Sickness: If you have a cold, fever, or any active infections, call and cancel the procedure. . Arrival: You must be in the facility at least 30 minutes prior to your scheduled procedure. . Children: Do not bring children with you. . Dress appropriately:  Bring dark clothing that you would not mind if they get stained. . Valuables: Do not bring any jewelry or valuables.  Reasons to call and reschedule or cancel your procedure: (Following these recommendations will minimize the risk of a serious complication.) . Surgeries: Avoid having procedures within 2 weeks of any surgery. (Avoid for 2 weeks before or after any surgery). . Flu Shots: Avoid having procedures within 2 weeks of a flu shots or . (Avoid for 2 weeks before or after immunizations). . Barium: Avoid having a procedure within 7-10 days after having had a radiological study involving the use of radiological contrast. (Myelograms, Barium swallow or enema study). . Heart attacks: Avoid any elective procedures or surgeries for the initial 6 months after a "Myocardial Infarction" (Heart Attack). . Blood thinners: It is imperative that you stop these medications before procedures. Let us know if you if you take any blood thinner.  . Infection: Avoid procedures during or within two weeks of an infection (including chest colds or gastrointestinal problems). Symptoms associated with infections include: Localized redness, fever, chills, night sweats or profuse sweating, burning sensation when voiding, cough, congestion, stuffiness, runny nose, sore throat, diarrhea, nausea, vomiting, cold or Flu symptoms, recent or current infections. It is specially important if the infection is over the area that we intend to treat. Marland Kitchen Heart and lung problems: Symptoms that may suggest an active cardiopulmonary problem include: cough, chest pain, breathing difficulties or shortness of breath, dizziness, ankle swelling, uncontrolled high or unusually low blood pressure, and/or palpitations. If you are experiencing any of these symptoms, cancel your procedure and contact your primary care physician for an evaluation.  Remember:  Regular Business hours are:  Monday to Thursday 8:00 AM to 4:00 PM  Provider's  Schedule: Milinda Pointer, MD:  Procedure days: Tuesday and Thursday 7:30 AM to 4:00 PM  Gillis Santa, MD:  Procedure days: Monday and Wednesday 7:30 AM to 4:00 PM ____________________________________________________________________________________________

## 2020-03-24 ENCOUNTER — Telehealth: Payer: Self-pay

## 2020-03-24 NOTE — Telephone Encounter (Signed)
Insurance denied authorization for the facets. What do you want to do from here?

## 2020-03-27 ENCOUNTER — Other Ambulatory Visit: Payer: Self-pay | Admitting: Surgery

## 2020-03-27 ENCOUNTER — Other Ambulatory Visit: Payer: Self-pay | Admitting: General Surgery

## 2020-03-27 DIAGNOSIS — N6489 Other specified disorders of breast: Secondary | ICD-10-CM

## 2020-03-30 ENCOUNTER — Other Ambulatory Visit: Payer: Self-pay | Admitting: Urology

## 2020-04-03 ENCOUNTER — Other Ambulatory Visit: Payer: Self-pay

## 2020-04-03 ENCOUNTER — Ambulatory Visit
Admission: RE | Admit: 2020-04-03 | Discharge: 2020-04-03 | Disposition: A | Discharge status: Home / Self Care | Payer: Managed Care, Other (non HMO) | Source: Ambulatory Visit | Attending: Urology | Admitting: Urology

## 2020-04-03 DIAGNOSIS — R319 Hematuria, unspecified: Secondary | ICD-10-CM | POA: Insufficient documentation

## 2020-04-06 ENCOUNTER — Encounter: Payer: Self-pay | Admitting: Urology

## 2020-04-06 ENCOUNTER — Other Ambulatory Visit: Payer: Self-pay

## 2020-04-06 ENCOUNTER — Ambulatory Visit: Payer: Managed Care, Other (non HMO) | Admitting: Urology

## 2020-04-06 VITALS — BP 131/84 | HR 73 | Ht 63.0 in | Wt 221.0 lb

## 2020-04-06 DIAGNOSIS — R319 Hematuria, unspecified: Secondary | ICD-10-CM | POA: Diagnosis not present

## 2020-04-06 DIAGNOSIS — R310 Gross hematuria: Secondary | ICD-10-CM | POA: Diagnosis not present

## 2020-04-06 LAB — MICROSCOPIC EXAMINATION
Bacteria, UA: NONE SEEN
WBC, UA: NONE SEEN /hpf (ref 0–5)

## 2020-04-06 LAB — URINALYSIS, COMPLETE
Bilirubin, UA: NEGATIVE
Glucose, UA: NEGATIVE
Ketones, UA: NEGATIVE
Leukocytes,UA: NEGATIVE
Nitrite, UA: NEGATIVE
Protein,UA: NEGATIVE
Specific Gravity, UA: 1.015 (ref 1.005–1.030)
Urobilinogen, Ur: 1 mg/dL (ref 0.2–1.0)
pH, UA: 7 (ref 5.0–7.5)

## 2020-04-06 NOTE — Progress Notes (Signed)
04/06/2020 10:52 AM   Shelly Huynh Aldona Lento June 10, 1971 [id number]  Referring provider: Tracie Harrier, MD [address] [facility] [place],  [place] [address]  Chief Complaint  Patient presents with  . Cysto    HPI: A few weeks ago patient had painless gross hematuria.  3 days prior she said she was tested and did not have a bladder infection.  She thinks he gets 1 or 2 infections a year.  She takes daily aspirin and Coumadin and has a smoking history.  She believes she has been told she has microscopic hematuria  She has nonspecific abdominal pain in all 4 quadrants.  It is intermittent.  At baseline she sometimes leaks with urgency and coughing and wears 1-3 pads a day that are damp.  She denies enuresis.  She voids every 1-2 hours and takes a diuretic.  Gets up once a night  No history of kidney stones previous to surgery.  She had a limited gallbladder ultrasound September 06, 2019  Patient has mild mixed incontinence.  She has a smoking history.  [name] on Coumadin.  She has occasional bladder infections.  Her diffuse abdominal discomfort probably not related to the genitourinary system but we can make comment regarding this following  Today Frequency stable. No hematuria since CT stone protocol obtained due to anaphylactic reaction allergy to contrast. Last culture negative Cystoscopy: Patient underwent flexible cystoscopy. Bladder mucosa and trigone were normal. No cystitis. No carcinoma. No blood from ureteral orifices Urinalysis: No bacteria no microscopic hematuria     PMH: Past Medical History:  Diagnosis Date  . Acid reflux   . Anxiety   . Arthritis   . CHF (congestive heart failure) ([place])   . Depression   . Diabetes mellitus without complication ([place])   . Heart valve replaced   . High cholesterol   . High cholesterol   . Hypertension   . Hypokalemia   . IBS (irritable bowel syndrome)   . Sciatic pain     Surgical History: Past  Surgical History:  Procedure Laterality Date  . ABDOMINAL HYSTERECTOMY    . BREAST BIOPSY Left 07/25/2017   BENIGN BREAST TISSUE WITH MICROCALCIFICATIONS  . BREAST BIOPSY Right 10/22/2019   Affirm Bx- X-clip- path pending  . BREAST BIOPSY Right 10/22/2019   Affirm Bx- Ribbon clip- path pending  . BREAST BIOPSY Right 11/20/2019   Procedure: BREAST BIOPSY WITH NEEDLE LOCALIZATION x 2;  Surgeon: Herbert Pun, MD;  Location: [facility] ORS;  Service: General;  Laterality: Right;  . BREAST EXCISIONAL BIOPSY Right 1992   NEG  . BREAST LUMPECTOMY Right 11/20/2019   complex sclerosing lesion x 2  . CARDIAC VALVE SURGERY    . CESAREAN SECTION    . ESOPHAGOGASTRODUODENOSCOPY (EGD) WITH PROPOFOL N/A 10/07/2016   Procedure: ESOPHAGOGASTRODUODENOSCOPY (EGD) WITH PROPOFOL;  Surgeon: Jonathon Bellows, MD;  Location: [facility] ENDOSCOPY;  Service: Endoscopy;  Laterality: N/A;  . HERNIA REPAIR      Home Medications:  Allergies as of 04/06/2020      Reactions   Ivp Dye [iodinated Diagnostic Agents] Anaphylaxis, Hives   Especially CT contrast media; tightness in chest   Sulfa Antibiotics Hives, Anaphylaxis   Banana Itching   Guaifenesin Hives   Hctz [hydrochlorothiazide] Hives   Losartan Itching   Povidone Iodine Hives   Povidone-iodine Hives   Lisinopril Hives, Rash   Mucinex [guaifenesin Er] Hives, Rash      Medication List       Accurate as of Apr 06, 2020 10:52 AM. If you have any questions, ask your nurse or doctor.        STOP taking these medications   Biotin 5000 MCG Tabs Stopped by: Reece Packer, MD     TAKE these medications   Aimovig 140 MG/ML Soaj Generic drug: Erenumab-aooe Inject 140 mg into the skin every 28 (twenty-eight) days.   amoxicillin 500 MG capsule Commonly known as: AMOXIL Take 2,000 mg by mouth as needed (for dental procedure).   aspirin EC 81 MG tablet Take 81 mg daily by mouth.   candesartan 16 MG tablet Commonly known as: ATACAND Take 16 mg by  mouth daily.   carvedilol 6.25 MG tablet Commonly known as: COREG Take 6.25 mg by mouth 2 (two) times daily.   Cholecalciferol 25 MCG (1000 UT) tablet Take by mouth.   cyclobenzaprine 10 MG tablet Commonly known as: FLEXERIL Take 10 mg by mouth 3 (three) times daily as needed for muscle spasms.   diazepam 5 MG tablet Commonly known as: VALIUM Take 5 mg by mouth every 8 (eight) hours as needed for anxiety or muscle spasms.   docusate sodium 100 MG capsule Commonly known as: COLACE Take 100 mg by mouth daily as needed for mild constipation.   DULoxetine 30 MG capsule Commonly known as: CYMBALTA Take 30 mg by mouth at bedtime.   enoxaparin 40 MG/0.4ML injection Commonly known as: LOVENOX Inject 40 mg into the skin every 12 (twelve) hours.   Ferrex 150 150 MG capsule Generic drug: iron polysaccharides Take 150 mg by mouth daily.   fluticasone 50 MCG/ACT nasal spray Commonly known as: FLONASE Place 2 sprays into both nostrils daily as needed for allergies or rhinitis.   gabapentin 300 MG capsule Commonly known as: NEURONTIN Take 1 capsule (300 mg total) by mouth at bedtime.   HYDROcodone-acetaminophen 5-325 MG tablet Commonly known as: NORCO/VICODIN Take 1 tablet by mouth at bedtime as needed for severe pain. Must last 30 days.   hydrocortisone 2.5 % cream Apply 1 application topically 2 (two) times a week.   levalbuterol 1.25 MG/0.5ML nebulizer solution Commonly known as: XOPENEX Take 1.25 mg by nebulization every 4 (four) hours as needed for wheezing or shortness of breath.   levocetirizine 5 MG tablet Commonly known as: XYZAL Take 5 mg by mouth every evening.   linaclotide 290 MCG Caps capsule Commonly known as: LINZESS Take 290 mcg by mouth daily as needed (constipation).   meclizine 25 MG tablet Commonly known as: ANTIVERT Take 25 mg by mouth 3 (three) times daily as needed for dizziness.   Minoxidil 5 % Foam Apply 1 mL topically daily.    pantoprazole 40 MG tablet Commonly known as: PROTONIX Take 40 mg by mouth 2 (two) times daily.   pravastatin 20 MG tablet Commonly known as: PRAVACHOL Take 20 mg by mouth daily.   ProAir HFA 108 (90 Base) MCG/ACT inhaler Generic drug: albuterol Inhale 2 puffs into the lungs every 4 (four) hours as needed for wheezing or shortness of breath.   spironolactone 50 MG tablet Commonly known as: ALDACTONE Take 75 mg by mouth daily.   sucralfate 1 g tablet Commonly known as: CARAFATE Take 1 g by mouth 3 (three) times daily.   warfarin 6 MG tablet Commonly known as: COUMADIN Take 6 mg by mouth See admin instructions. Mon and Tues, WED   warfarin 7.5 MG tablet Commonly known as: COUMADIN Take 1 tablet (7.5 mg total) one time only at 6 PM by mouth.  Allergies:  Allergies  Allergen Reactions  . Ivp Dye [Iodinated Diagnostic Agents] Anaphylaxis and Hives    Especially CT contrast media; tightness in chest  . Sulfa Antibiotics Hives and Anaphylaxis  . Banana Itching  . Guaifenesin Hives  . Hctz [Hydrochlorothiazide] Hives  . Losartan Itching  . Povidone Iodine Hives  . Povidone-Iodine Hives  . Lisinopril Hives and Rash  . Mucinex [Guaifenesin Er] Hives and Rash    Family History: Family History  Problem Relation Age of Onset  . Breast cancer Paternal Grandmother     Social History:  reports that she quit smoking about 5 years ago. She has never used smokeless tobacco. She reports that she does not drink alcohol or use drugs.  ROS:                                        Physical Exam: BP 131/84   Pulse 73   Ht 5' 3"  (1.6 m)   Wt 221 lb (100.2 kg)   BMI 39.15 kg/m   Constitutional:  Alert and oriented, No acute distress.  Laboratory Data: Lab Results  Component Value Date   WBC 8.8 10/04/2017   HGB 11.2 (L) 10/04/2017   HCT 34.8 (L) 10/04/2017   MCV 88.1 10/04/2017   PLT 424 10/04/2017    Lab Results  Component Value Date    CREATININE 0.73 12/23/2019    No results found for: PSA  No results found for: TESTOSTERONE  No results found for: HGBA1C  Urinalysis    Component Value Date/Time   COLORURINE YELLOW 06/14/2014 1714   APPEARANCEUR Clear 03/02/2020 0925   LABSPEC 1.020 06/14/2014 1714   LABSPEC 1.005 08/29/2013 1130   PHURINE 6.0 06/14/2014 1714   GLUCOSEU Negative 03/02/2020 0925   GLUCOSEU Negative 08/29/2013 1130   HGBUR NEGATIVE 06/14/2014 1714   BILIRUBINUR Negative 03/02/2020 0925   BILIRUBINUR Negative 08/29/2013 1130   KETONESUR NEGATIVE 06/14/2014 1714   PROTEINUR Negative 03/02/2020 0925   PROTEINUR NEGATIVE 06/14/2014 1714   UROBILINOGEN 1.0 06/14/2014 1714   NITRITE Negative 03/02/2020 0925   NITRITE NEGATIVE 06/14/2014 1714   LEUKOCYTESUR Negative 03/02/2020 0925   LEUKOCYTESUR Negative 08/29/2013 1130    Pertinent Imaging:   Assessment & Plan: Patient had other questions about her liver and gallbladder and aorta answered. Follow-up with primary care physician. I would like to have her see one of my partners in about 6 weeks with another urinalysis. She did have painless gross hematuria I am diagnosed. She understands with a picture drawn that bilateral ureteroscopy is a potential option. I did not order urine cytology today. Quit smoking years ago  1. Hematuria, unspecified type  - Urinalysis, Complete   No follow-ups on file.  Reece Packer, MD  [place] [address] [place], [place] [address] [phone]

## 2020-04-08 ENCOUNTER — Other Ambulatory Visit: Payer: Self-pay | Admitting: Student

## 2020-04-08 DIAGNOSIS — R1010 Upper abdominal pain, unspecified: Secondary | ICD-10-CM

## 2020-04-08 DIAGNOSIS — K8020 Calculus of gallbladder without cholecystitis without obstruction: Secondary | ICD-10-CM

## 2020-04-17 ENCOUNTER — Other Ambulatory Visit: Payer: Self-pay

## 2020-04-17 ENCOUNTER — Ambulatory Visit (HOSPITAL_COMMUNITY)
Admission: RE | Admit: 2020-04-17 | Discharge: 2020-04-17 | Disposition: A | Discharge status: Home / Self Care | Payer: Managed Care, Other (non HMO) | Source: Ambulatory Visit | Attending: Student | Admitting: Student

## 2020-04-17 ENCOUNTER — Ambulatory Visit (HOSPITAL_COMMUNITY): Payer: Managed Care, Other (non HMO)

## 2020-04-17 DIAGNOSIS — K8020 Calculus of gallbladder without cholecystitis without obstruction: Secondary | ICD-10-CM | POA: Insufficient documentation

## 2020-04-17 DIAGNOSIS — R1010 Upper abdominal pain, unspecified: Secondary | ICD-10-CM | POA: Diagnosis present

## 2020-04-17 MED ORDER — TECHNETIUM TC 99M MEBROFENIN IV KIT
5.0000 | PACK | Freq: Once | INTRAVENOUS | Status: AC | PRN
  Administered 2020-04-17: 5 via INTRAVENOUS

## 2020-04-27 ENCOUNTER — Encounter: Payer: Self-pay | Admitting: Radiology

## 2020-05-21 ENCOUNTER — Ambulatory Visit
Admission: RE | Admit: 2020-05-21 | Discharge: 2020-05-21 | Disposition: A | Discharge status: Home / Self Care | Payer: 59 | Source: Ambulatory Visit | Attending: General Surgery | Admitting: General Surgery

## 2020-05-21 DIAGNOSIS — N6489 Other specified disorders of breast: Secondary | ICD-10-CM

## 2020-05-27 ENCOUNTER — Encounter: Payer: Self-pay | Admitting: Urology

## 2020-05-27 ENCOUNTER — Ambulatory Visit (INDEPENDENT_AMBULATORY_CARE_PROVIDER_SITE_OTHER): Payer: 59 | Admitting: Urology

## 2020-05-27 ENCOUNTER — Other Ambulatory Visit: Payer: Self-pay

## 2020-05-27 VITALS — BP 135/88 | HR 78 | Ht 63.0 in | Wt 218.0 lb

## 2020-05-27 DIAGNOSIS — R319 Hematuria, unspecified: Secondary | ICD-10-CM | POA: Diagnosis not present

## 2020-05-27 NOTE — Progress Notes (Signed)
   05/27/2020 11:34 AM   Shelly Huynh Jun 29, 1971 [id number]  Reason for visit: Follow up gross hematuria  HPI: I saw Shelly Huynh for evaluation of gross hematuria today.  [name] a 49 year old African-American female who has been followed by Dr. Matilde Sprang after an episode of gross hematuria.  She reports she had a few episodes of noticeable red urine, however she does report she had a UTI at that time with dysuria which improved with antibiotics.  She underwent evaluation with a CT scan without contrast(anaphylactic allergy to contrast)-this showed no hydronephrosis, nephrolithiasis, or other abnormalities.  She also underwent cystoscopy with Dr. Matilde Sprang which was completely benign.  She has had 2 subsequent urinalysis since then in April and May both of which showed no microscopic hematuria.   She has a 15-pack-year smoking history and quit in 2016, and denies any other carcinogenic exposures.  She denies any gross hematuria since the time of infection.  I suspect her gross hematuria was secondary to acute urinary tract infection, and subsequent urinalyses have not shown any microscopic hematuria.  Urinalysis today still pending.  We discussed options at length.  I recommended following up urinalysis today, and if significant microscopic hematuria is found sending a urine cytology, however if urinalysis is negative would just recommend follow-up as needed.  We discussed return precautions including recurrence of gross hematuria.  Call with urinalysis results, send cytology if significant microscopic hematuria   [place], [place] [address] [place], [place] [address] [phone]

## 2020-05-28 LAB — MICROSCOPIC EXAMINATION

## 2020-05-28 LAB — URINALYSIS, COMPLETE
Bilirubin, UA: NEGATIVE
Glucose, UA: NEGATIVE
Ketones, UA: NEGATIVE
Leukocytes,UA: NEGATIVE
Nitrite, UA: NEGATIVE
Protein,UA: NEGATIVE
Specific Gravity, UA: 1.01 (ref 1.005–1.030)
Urobilinogen, Ur: 0.2 mg/dL (ref 0.2–1.0)
pH, UA: 7.5 (ref 5.0–7.5)

## 2020-05-29 ENCOUNTER — Telehealth: Payer: Self-pay

## 2020-05-29 NOTE — Telephone Encounter (Signed)
-----   Message from Billey Co, MD sent at 05/28/2020  4:54 PM EDT ----- Good news, no blood in urine, no further workup needed follow up as needed  Nickolas Madrid, MD 05/28/2020

## 2020-05-29 NOTE — Telephone Encounter (Signed)
Called pt informed her of the information below. Pt gave verbal understanding.

## 2020-06-30 ENCOUNTER — Other Ambulatory Visit: Payer: Self-pay

## 2020-06-30 ENCOUNTER — Emergency Department
Admission: EM | Admit: 2020-06-30 | Discharge: 2020-06-30 | Disposition: A | Discharge status: Home / Self Care | Payer: 59 | Attending: Emergency Medicine | Admitting: Emergency Medicine

## 2020-06-30 ENCOUNTER — Encounter: Payer: Self-pay | Admitting: Emergency Medicine

## 2020-06-30 DIAGNOSIS — R1010 Upper abdominal pain, unspecified: Secondary | ICD-10-CM | POA: Diagnosis not present

## 2020-06-30 DIAGNOSIS — M549 Dorsalgia, unspecified: Secondary | ICD-10-CM | POA: Diagnosis not present

## 2020-06-30 DIAGNOSIS — M25512 Pain in left shoulder: Secondary | ICD-10-CM | POA: Insufficient documentation

## 2020-06-30 DIAGNOSIS — M25511 Pain in right shoulder: Secondary | ICD-10-CM | POA: Diagnosis not present

## 2020-06-30 DIAGNOSIS — R112 Nausea with vomiting, unspecified: Secondary | ICD-10-CM | POA: Diagnosis not present

## 2020-06-30 DIAGNOSIS — Z5321 Procedure and treatment not carried out due to patient leaving prior to being seen by health care provider: Secondary | ICD-10-CM | POA: Diagnosis not present

## 2020-06-30 DIAGNOSIS — K5900 Constipation, unspecified: Secondary | ICD-10-CM | POA: Insufficient documentation

## 2020-06-30 LAB — CBC
HCT: 40 % (ref 36.0–46.0)
Hemoglobin: 12.7 g/dL (ref 12.0–15.0)
MCH: 30.2 pg (ref 26.0–34.0)
MCHC: 31.8 g/dL (ref 30.0–36.0)
MCV: 95 fL (ref 80.0–100.0)
Platelets: 258 10*3/uL (ref 150–400)
RBC: 4.21 MIL/uL (ref 3.87–5.11)
RDW: 17.2 % — ABNORMAL HIGH (ref 11.5–15.5)
WBC: 9.4 10*3/uL (ref 4.0–10.5)
nRBC: 0 % (ref 0.0–0.2)

## 2020-06-30 LAB — URINALYSIS, COMPLETE (UACMP) WITH MICROSCOPIC
Bacteria, UA: NONE SEEN
Bilirubin Urine: NEGATIVE
Glucose, UA: NEGATIVE mg/dL
Hgb urine dipstick: NEGATIVE
Ketones, ur: NEGATIVE mg/dL
Leukocytes,Ua: NEGATIVE
Nitrite: NEGATIVE
Protein, ur: NEGATIVE mg/dL
Specific Gravity, Urine: 1.016 (ref 1.005–1.030)
WBC, UA: NONE SEEN WBC/hpf (ref 0–5)
pH: 7 (ref 5.0–8.0)

## 2020-06-30 LAB — COMPREHENSIVE METABOLIC PANEL
ALT: 37 U/L (ref 0–44)
AST: 91 U/L — ABNORMAL HIGH (ref 15–41)
Albumin: 4.3 g/dL (ref 3.5–5.0)
Alkaline Phosphatase: 141 U/L — ABNORMAL HIGH (ref 38–126)
Anion gap: 8 (ref 5–15)
BUN: 8 mg/dL (ref 6–20)
CO2: 25 mmol/L (ref 22–32)
Calcium: 9.2 mg/dL (ref 8.9–10.3)
Chloride: 106 mmol/L (ref 98–111)
Creatinine, Ser: 0.98 mg/dL (ref 0.44–1.00)
GFR calc Af Amer: >60 mL/min (ref >60)
GFR calc non Af Amer: >60 mL/min (ref >60)
Glucose, Bld: 125 mg/dL — ABNORMAL HIGH (ref 70–99)
Potassium: 3.9 mmol/L (ref 3.5–5.1)
Sodium: 139 mmol/L (ref 135–145)
Total Bilirubin: 2 mg/dL — ABNORMAL HIGH (ref 0.3–1.2)
Total Protein: 8.5 g/dL — ABNORMAL HIGH (ref 6.5–8.1)

## 2020-06-30 LAB — LIPASE, BLOOD: Lipase: 38 U/L (ref 11–51)

## 2020-06-30 NOTE — ED Triage Notes (Signed)
Patient presents to the ED with upper abdominal pain that began yesterday. Patient states pain originates in the right upper quadrant and radiates into the left upper quadrant, into her back and into her shoulders.  Patient reports nausea and vomiting and constipated.  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  Patient reports vomiting x 2 in the past 24 hours.  Patient reports similar pain in the past but states it was never this severe.  Patient reports history of small gallstones but still has her gallbladder.

## 2020-06-30 NOTE — ED Notes (Signed)
[place] sent to the lab.  

## 2020-07-02 ENCOUNTER — Emergency Department
Admission: EM | Admit: 2020-07-02 | Discharge: 2020-07-03 | Disposition: A | Discharge status: Home / Self Care | Payer: 59 | Attending: Emergency Medicine | Admitting: Emergency Medicine

## 2020-07-02 ENCOUNTER — Other Ambulatory Visit: Payer: Self-pay

## 2020-07-02 ENCOUNTER — Encounter: Payer: Self-pay | Admitting: Emergency Medicine

## 2020-07-02 ENCOUNTER — Emergency Department
Admission: RE | Admit: 2020-07-02 | Discharge: 2020-07-02 | Disposition: A | Discharge status: Home / Self Care | Payer: 59 | Source: Ambulatory Visit | Attending: Internal Medicine | Admitting: Internal Medicine

## 2020-07-02 ENCOUNTER — Other Ambulatory Visit: Payer: Self-pay | Admitting: Internal Medicine

## 2020-07-02 DIAGNOSIS — R1011 Right upper quadrant pain: Secondary | ICD-10-CM

## 2020-07-02 DIAGNOSIS — E119 Type 2 diabetes mellitus without complications: Secondary | ICD-10-CM | POA: Diagnosis not present

## 2020-07-02 DIAGNOSIS — R945 Abnormal results of liver function studies: Secondary | ICD-10-CM | POA: Insufficient documentation

## 2020-07-02 DIAGNOSIS — R1084 Generalized abdominal pain: Secondary | ICD-10-CM | POA: Insufficient documentation

## 2020-07-02 DIAGNOSIS — I110 Hypertensive heart disease with heart failure: Secondary | ICD-10-CM | POA: Diagnosis not present

## 2020-07-02 DIAGNOSIS — K859 Acute pancreatitis without necrosis or infection, unspecified: Secondary | ICD-10-CM

## 2020-07-02 DIAGNOSIS — Z79899 Other long term (current) drug therapy: Secondary | ICD-10-CM | POA: Insufficient documentation

## 2020-07-02 DIAGNOSIS — I509 Heart failure, unspecified: Secondary | ICD-10-CM | POA: Insufficient documentation

## 2020-07-02 DIAGNOSIS — Z87891 Personal history of nicotine dependence: Secondary | ICD-10-CM | POA: Diagnosis not present

## 2020-07-02 DIAGNOSIS — R7989 Other specified abnormal findings of blood chemistry: Secondary | ICD-10-CM

## 2020-07-02 DIAGNOSIS — Z7982 Long term (current) use of aspirin: Secondary | ICD-10-CM | POA: Insufficient documentation

## 2020-07-02 DIAGNOSIS — I2510 Atherosclerotic heart disease of native coronary artery without angina pectoris: Secondary | ICD-10-CM | POA: Insufficient documentation

## 2020-07-02 LAB — COMPREHENSIVE METABOLIC PANEL
ALT: 43 U/L (ref 0–44)
AST: 46 U/L — ABNORMAL HIGH (ref 15–41)
Albumin: 4.2 g/dL (ref 3.5–5.0)
Alkaline Phosphatase: 125 U/L (ref 38–126)
Anion gap: 12 (ref 5–15)
BUN: 8 mg/dL (ref 6–20)
CO2: 22 mmol/L (ref 22–32)
Calcium: 9.2 mg/dL (ref 8.9–10.3)
Chloride: 105 mmol/L (ref 98–111)
Creatinine, Ser: 0.71 mg/dL (ref 0.44–1.00)
GFR calc Af Amer: >60 mL/min (ref >60)
GFR calc non Af Amer: >60 mL/min (ref >60)
Glucose, Bld: 99 mg/dL (ref 70–99)
Potassium: 3.2 mmol/L — ABNORMAL LOW (ref 3.5–5.1)
Sodium: 139 mmol/L (ref 135–145)
Total Bilirubin: 5.9 mg/dL — ABNORMAL HIGH (ref 0.3–1.2)
Total Protein: 8.9 g/dL — ABNORMAL HIGH (ref 6.5–8.1)

## 2020-07-02 LAB — CBC WITH DIFFERENTIAL/PLATELET
Abs Immature Granulocytes: 0.12 10*3/uL — ABNORMAL HIGH (ref 0.00–0.07)
Basophils Absolute: 0 10*3/uL (ref 0.0–0.1)
Basophils Relative: 0 %
Eosinophils Absolute: 0 10*3/uL (ref 0.0–0.5)
Eosinophils Relative: 0 %
HCT: 34.4 % — ABNORMAL LOW (ref 36.0–46.0)
Hemoglobin: 11.1 g/dL — ABNORMAL LOW (ref 12.0–15.0)
Immature Granulocytes: 1 %
Lymphocytes Relative: 5 %
Lymphs Abs: 0.4 10*3/uL — ABNORMAL LOW (ref 0.7–4.0)
MCH: 29.8 pg (ref 26.0–34.0)
MCHC: 32.3 g/dL (ref 30.0–36.0)
MCV: 92.2 fL (ref 80.0–100.0)
Monocytes Absolute: 1.2 10*3/uL — ABNORMAL HIGH (ref 0.1–1.0)
Monocytes Relative: 13 %
Neutro Abs: 7.5 10*3/uL (ref 1.7–7.7)
Neutrophils Relative %: 81 %
Platelets: 189 10*3/uL (ref 150–400)
RBC: 3.73 MIL/uL — ABNORMAL LOW (ref 3.87–5.11)
RDW: 17 % — ABNORMAL HIGH (ref 11.5–15.5)
WBC: 9.2 10*3/uL (ref 4.0–10.5)
nRBC: 0 % (ref 0.0–0.2)

## 2020-07-02 LAB — LIPASE, BLOOD: Lipase: 262 U/L — ABNORMAL HIGH (ref 11–51)

## 2020-07-02 MED ORDER — SODIUM CHLORIDE 0.9 % IV SOLN: Freq: Once | INTRAVENOUS | Status: AC

## 2020-07-02 MED ORDER — HYDROCORTISONE NA SUCCINATE PF 100 MG IJ SOLR: 200.0000 mg | Freq: Once | INTRAMUSCULAR | Status: DC

## 2020-07-02 MED ORDER — ONDANSETRON HCL 4 MG/2ML IJ SOLN
4.0000 mg | Freq: Once | INTRAMUSCULAR | Status: AC
  Administered 2020-07-02: 4 mg via INTRAVENOUS
  Filled 2020-07-02: qty 2

## 2020-07-02 MED ORDER — MORPHINE SULFATE (PF) 4 MG/ML IV SOLN
6.0000 mg | Freq: Once | INTRAVENOUS | Status: AC
  Administered 2020-07-02: 4 mg via INTRAVENOUS
  Filled 2020-07-02: qty 2

## 2020-07-02 MED ORDER — DIPHENHYDRAMINE HCL 50 MG/ML IJ SOLN: 50.0000 mg | Freq: Once | INTRAMUSCULAR | Status: DC

## 2020-07-02 NOTE — ED Notes (Signed)
Iv Team at bedside. Attempting for US guided IV. Pt sitting up on side of bed unable to tolerate laying down. Call bell within reach.

## 2020-07-02 NOTE — ED Notes (Signed)
Heart valve card located by family. Picture sent to family cell phone. MRI aware.

## 2020-07-02 NOTE — ED Notes (Signed)
MRI called advising need for heart valve card. Family attempting to locate card at home. MRI questions answered by pt with nurse assistance.

## 2020-07-02 NOTE — ED Notes (Signed)
Pt O2 sat dropping while sleeping. Pt placed on 2l [place] for additional support. Family states pt uses CPAP at home.

## 2020-07-02 NOTE — ED Triage Notes (Signed)
Pt here with c/o RUQ abd pain since 06/30/20, she was seen here for the same at that time. Today, had an [place] and was sent here by her Dr for "emergent gallbladder removal." Pt diaphoretic and tearful in triage, appears in pain.

## 2020-07-02 NOTE — ED Notes (Signed)
Pt is diaphoretic and cool to touch, appears in obvious discomfort.  Previous triage RN attempted IV start x3 without success.  I checked with tourniquet and can not feel a promising vein.    Will notify charge RN and sort

## 2020-07-02 NOTE — ED Notes (Signed)
Dr. Cinda Quest at the bedside to discuss plan of care

## 2020-07-03 ENCOUNTER — Emergency Department: Payer: 59

## 2020-07-03 MED ORDER — MORPHINE SULFATE (PF) 4 MG/ML IV SOLN
4.0000 mg | Freq: Once | INTRAVENOUS | Status: AC
  Administered 2020-07-03: 4 mg via INTRAVENOUS
  Filled 2020-07-03: qty 1

## 2020-07-03 MED ORDER — ONDANSETRON HCL 4 MG/2ML IJ SOLN
4.0000 mg | Freq: Once | INTRAMUSCULAR | Status: AC
  Administered 2020-07-03: 4 mg via INTRAVENOUS
  Filled 2020-07-03: qty 2

## 2020-07-03 MED ORDER — GADOBUTROL 1 MMOL/ML IV SOLN
9.0000 mL | Freq: Once | INTRAVENOUS | Status: AC | PRN
  Administered 2020-07-03: 9 mL via INTRAVENOUS

## 2020-07-03 MED ORDER — ONDANSETRON HCL 4 MG PO TABS: 4.0000 mg | ORAL_TABLET | Freq: Every day | ORAL | 0 refills | Status: DC | PRN

## 2020-07-03 MED ORDER — OXYCODONE HCL 5 MG PO TABS: 5.0000 mg | ORAL_TABLET | Freq: Three times a day (TID) | ORAL | 0 refills | Status: AC | PRN

## 2020-07-03 NOTE — ED Provider Notes (Signed)
[facility] Emergency Department Provider Note   ____________________________________________   First MD Initiated Contact with Patient 07/02/20 1919     (approximate)  I have reviewed the triage vital signs and the nursing notes.   HISTORY  Chief Complaint Abdominal Pain    HPI Shelly Huynh is a 49 y.o. female who had right upper quadrant pain since the 10th.  Today she had an ultrasound was sent by her doctor for emergent gallbladder removal.  And a lot of pain when she gets here.  Ultrasound shows unremarkable liver no bile duct dilatation there is a hypoechoic area in the soft tissues of the left flank overlying the left kidney which they could not determine what it was.  Patient also had cholelithiasis.  No sign of cholecystitis.  Here the patient's LFTs are minimally elevated but she has an elevated lipase of 262.  Additionally her bilirubin has gone from 2 on the 10th of the month to 5.9 today.  Her lipase was only 38 on the 10th.  Her alk phos which was 141 on the 10th is now 125 AST was 91 on the 10th is now 46 ALT was 37 is now 43.  Patient's pain is easily controlled with 4 mg of IV morphine.  Patient reportedly has anaphylaxis to IV dye especially CT dye with tightness in the chest hives etc.  I will not try to do the steroid and Benadryl prophylaxis.  We will just do an MRI.  We can also do an MRCP that way.     Past Medical History:  Diagnosis Date  . Acid reflux   . Anxiety   . Arthritis   . CHF (congestive heart failure) ([place])   . Depression   . Diabetes mellitus without complication ([place])   . Heart valve replaced   . High cholesterol   . High cholesterol   . Hypertension   . Hypokalemia   . IBS (irritable bowel syndrome)   . Sciatic pain     Patient Active Problem List   Diagnosis Date Noted  . Lumbosacral L5-S1 facet arthritis (Severe) (Bilateral) 02/18/2020  . Osteoarthritis of hips (Bilateral) 02/18/2020  . Chronic  prescription opiate use 01/16/2020  . Neurogenic pain 01/16/2020  . DDD (degenerative disc disease), lumbosacral 01/07/2020  . History of Allergy to iodine 01/07/2020    Class: History of  . History of allergy to radiographic contrast media 01/07/2020  . H/O mechanical aortic valve replacement 12/16/2019  . Chronic low back pain (1ry area of Pain) (Bilateral) (L>R) w/o sciatica 12/16/2019  . Chronic lower extremity pain (2ry area of Pain) (Bilateral) (L>R) 12/16/2019  . Chronic hip pain (3ry area of Pain) (Bilateral) (L>R) 12/16/2019  . Chronic knee pain (4th area of Pain) (Bilateral) (L>R) 12/16/2019  . Chronic recurrent occipital headache (Bilateral) 12/16/2019  . Cervicogenic headache (6th area of Pain) (Bilateral) 12/16/2019  . Chronic neck pain (5th area of Pain) (Bilateral) (L>R) 12/16/2019  . Chronic shoulder pain (Bilateral) (L>R) 12/16/2019  . Chronic upper extremity pain (7th area of Pain) (Bilateral) (R>L) 12/16/2019  . Chronic pain syndrome 12/16/2019  . Pharmacologic therapy 12/16/2019  . Disorder of skeletal system 12/16/2019  . Problems influencing health status 12/16/2019  . Abnormal MRI, cervical spine (09/06/2019) 12/16/2019  . Cervical central spinal stenosis 12/16/2019  . Cervical foraminal stenosis (C2-3, C4-5) (Left) 12/16/2019  . Abnormal MRI, lumbar spine (08/24/2015) 12/16/2019  . Lumbar facet hypertrophy (L4-5 and L5-S1) (Bilateral) 12/16/2019  . Lumbar facet syndrome (  Bilateral) (L>R) 12/16/2019  . Cirrhosis of liver without ascites ([place]) 10/28/2019  . Elevated hemoglobin A1c 10/28/2019  . Lumbar radicular pain 10/28/2019  . Bilateral hand numbness 10/22/2019  . Cervicalgia 10/22/2019  . BMI 40.0-44.9, adult ([place]) 04/10/2019  . Irritable bowel syndrome with constipation 12/26/2018  . Status post bariatric surgery 09/12/2018  . Hypertension due to endocrine disorder 08/17/2018  . Hypertension, well controlled 08/14/2018  . Hyperaldosteronism ([place])  06/24/2018  . Cervical radiculopathy 06/12/2018  . Anxiety state 04/02/2018  . HCAP (healthcare-associated pneumonia) 10/03/2017  . Pleural effusion 10/02/2017  . Chest pain on breathing 09/21/2017  . Aortic valve disease, 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  . Tricuspid valve disorder 09/21/2017  . Mitral valve disease, 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  . SOBOE (shortness of breath on exertion) 04/24/2017  . Positive Helicobacter pylori serology 01/05/2017  . Tobacco abuse 09/19/2016  . On anticoagulant therapy 09/07/2016  . Asthma 09/07/2016  . Diabetes mellitus ([place]) 09/07/2016  . GERD with esophagitis 09/07/2016  . Hypertension 09/07/2016  . 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 Hypercholesterolemia 09/07/2016  . Obstructive sleep apnea syndrome 09/07/2016  . Bilateral leg edema 08/23/2016  . Osteoarthritis of spine with radiculopathy, lumbar region 08/08/2016  . Diabetes mellitus type 2, diet-controlled ([place]) 05/17/2016  . Vitamin D deficiency 05/17/2016  . Gastroesophageal reflux disease without esophagitis 10/12/2015  . Spondylosis of lumbar region without myelopathy or radiculopathy 10/12/2015  . Adiposity 06/30/2015  . Compulsive tobacco user syndrome 06/30/2015  . Nicotine dependence, uncomplicated 99/83/3825  . Chronic hypokalemia 06/15/2015  . Palpitations 05/18/2015  . Chronic anticoagulation (Coumadin) 05/07/2015  . Benign essential HTN 05/05/2015  . Absolute anemia 04/26/2015  . Acute on chronic combined systolic and diastolic congestive heart failure ([place]) 04/08/2015  . H/O aortic valve replacement 04/07/2015  . H/O heart valve replacement with mechanical valve 04/07/2015  . H/O prosthetic heart valve 04/07/2015  . 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 Acute right heart failure ([place]) 04/04/2015  . History of open heart surgery 04/03/2015  . H/O mitral valve replacement with mechanical valve 04/03/2015  . 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 Presence of  prosthetic heart valve 04/03/2015  . History of tricuspid valve replacement with bioprosthetic valve 04/03/2015  . Refusal of blood transfusions as patient is Jehovah's Witness 03/12/2015  . Acute respiratory failure with hypoxemia ([place]) 03/05/2015  . Coronary artery disease due to calcified coronary lesion 03/04/2015  . Obstructive apnea 10/28/2014  . Enlarged heart 10/28/2014  . Combined fat and carbohydrate induced hyperlipemia 09/11/2014    Past Surgical History:  Procedure Laterality Date  . ABDOMINAL HYSTERECTOMY    . BREAST BIOPSY Left 07/25/2017   BENIGN BREAST TISSUE WITH MICROCALCIFICATIONS  . BREAST BIOPSY Right 10/22/2019   Affirm Bx- X-clip- CSL, no atypia  . BREAST BIOPSY Right 10/22/2019   Affirm Bx- Ribbon clip- CSL, no atypia  . BREAST BIOPSY Right 11/20/2019   Procedure: BREAST BIOPSY WITH NEEDLE LOCALIZATION x 2;  Surgeon: Herbert Pun, MD;  Location: [facility] ORS;  Service: General;  Laterality: Right;  . BREAST EXCISIONAL BIOPSY Right 1992   NEG  . BREAST LUMPECTOMY Right 11/20/2019   complex sclerosing lesion x 2  . CARDIAC VALVE SURGERY    . CESAREAN SECTION    . ESOPHAGOGASTRODUODENOSCOPY (EGD) WITH PROPOFOL N/A 10/07/2016   Procedure: ESOPHAGOGASTRODUODENOSCOPY (EGD) WITH PROPOFOL;  Surgeon: Jonathon Bellows, MD;  Location: [facility] ENDOSCOPY;  Service: Endoscopy;  Laterality: N/A;  . HERNIA REPAIR      Prior to Admission medications  Medication Sig Start Date End Date Taking? Authorizing Provider  gabapentin (NEURONTIN) 300 MG capsule Take 1 capsule (300 mg total) by mouth at bedtime. 03/17/20 07/02/20 Yes Milinda Pointer, MD  Galcanezumab-gnlm 100 MG/ML SOSY Inject into the skin. Inject 120 mg subcutaneously every 28 (twenty-eight) days   Yes [provider]  levalbuterol (XOPENEX) 1.25 MG/0.5ML nebulizer solution Take 1.25 mg by nebulization every 4 (four) hours as needed for wheezing or shortness of breath.   Yes [provider]    linaclotide (LINZESS) 72 MCG capsule Take 72 mcg by mouth daily before breakfast.   Yes [provider]  meclizine (ANTIVERT) 25 MG tablet Take 25 mg by mouth 3 (three) times daily as needed for dizziness.  05/11/15  Yes [provider]  memantine (NAMENDA) 5 MG tablet Take 5 mg by mouth 2 (two) times daily.    Yes [provider]  spironolactone (ALDACTONE) 25 MG tablet Take 1 tablet (25 mg total) by mouth once daily Take with 50 mg tablet = 75 mg daily   Yes [provider]  spironolactone (ALDACTONE) 50 MG tablet Take 1 tablet (50 mg total) by mouth once daily Take with 25 mg tablet = 75 mg daily   Yes [provider]  aspirin EC 81 MG tablet Take 81 mg daily by mouth.    [provider]  candesartan (ATACAND) 16 MG tablet Take 16 mg by mouth daily. 12/07/19   [provider]  carvedilol (COREG) 6.25 MG tablet Take 6.25 mg by mouth 2 (two) times daily. 12/07/19   [provider]  Cholecalciferol 25 MCG (1000 UT) tablet Take by mouth.    [provider]  cyclobenzaprine (FLEXERIL) 10 MG tablet Take 10 mg by mouth 3 (three) times daily as needed for muscle spasms.  01/13/15   [provider]  diazepam (VALIUM) 5 MG tablet Take 5 mg by mouth every 8 (eight) hours as needed for anxiety or muscle spasms.     [provider]  docusate sodium (COLACE) 100 MG capsule Take 100 mg by mouth daily as needed for mild constipation.    [provider]  DULoxetine (CYMBALTA) 30 MG capsule Take 30 mg by mouth at bedtime.    [provider]  fluticasone (FLONASE) 50 MCG/ACT nasal spray Place 2 sprays into both nostrils daily as needed for allergies or rhinitis.    [provider]  hydrocortisone 2.5 % cream Apply 1 application topically 2 (two) times a week.    [provider]  iron polysaccharides (FERREX 150) 150 MG capsule Take 150 mg by mouth daily.    [provider]   levocetirizine (XYZAL) 5 MG tablet Take 5 mg by mouth every evening.  05/10/17   [provider]  Minoxidil 5 % FOAM Apply 1 mL topically daily.     [provider]  pantoprazole (PROTONIX) 40 MG tablet Take 40 mg by mouth 2 (two) times daily.  06/30/16   [provider]  pravastatin (PRAVACHOL) 20 MG tablet Take 20 mg by mouth daily.  12/07/14   [provider]  warfarin (COUMADIN) 6 MG tablet Take 6 mg by mouth See admin instructions. Mon and Tues, WED    [provider]  warfarin (COUMADIN) 7.5 MG tablet Take 1 tablet (7.5 mg total) one time only at 6 PM by mouth. 10/04/17   Bettey Costa, MD    Allergies Ivp dye [iodinated diagnostic agents], Sulfa antibiotics, Banana, Guaifenesin, Hctz [hydrochlorothiazide], Losartan, Povidone  iodine, Povidone-iodine, Lisinopril, and Mucinex [guaifenesin er]  Family History  Problem Relation Age of Onset  . Breast cancer Paternal Grandmother     Social History Social History   Tobacco Use  . Smoking status: Former Smoker    Quit date: 03/08/2015    Years since quitting: 5.3  . Smokeless tobacco: Never Used  Vaping Use  . Vaping Use: Never used  Substance Use Topics  . Alcohol use: No  . Drug use: No    Review of Systems  Constitutional: No fever/chills Eyes: No visual changes. ENT: No sore throat. Cardiovascular: Denies chest pain. Respiratory: Denies shortness of breath. Gastrointestinal: abdominal pain.  nausea, vomiting.  No diarrhea.  No constipation. Genitourinary: Negative for dysuria. Musculoskeletal: Negative for back pain. Skin: Negative for rash. Neurological: Negative for headaches, focal weakness  ____________________________________________   PHYSICAL EXAM:  VITAL SIGNS: ED Triage Vitals  Enc Vitals Group     BP 07/02/20 1832 (!) 124/105     Pulse Rate 07/02/20 1832 66     Resp 07/02/20 1832 18     Temp 07/02/20 1832 99.4 F (37.4 C)     Temp Source 07/02/20 1832 Oral      SpO2 07/02/20 1832 99 %     Weight 07/02/20 1835 220 lb (99.8 kg)     Height 07/02/20 1835 5' 2"  (1.575 m)     Head Circumference --      Peak Flow --      Pain Score 07/02/20 1835 10     Pain Loc --      Pain Edu? --      Excl. in [place]? --     Constitutional: Alert and oriented.  Initially in severe pain later well appearing and in no acute distress. Eyes: Conjunctivae are normal.  Head: Atraumatic. Nose: No congestion/rhinnorhea. Mouth/Throat: Mucous membranes are moist.  Oropharynx non-erythematous. Neck: No stridor.   Cardiovascular: Normal rate, regular rhythm. Grossly normal heart sounds.  Good peripheral circulation. Respiratory: Normal respiratory effort.  No retractions. Lungs CTAB. Gastrointestinal: Soft tender in the entire left side and middle of the belly no distention. No abdominal bruits. No CVA tenderness. Musculoskeletal: No lower extremity tenderness nor edema.  . Neurologic:  Normal speech and language. No gross focal neurologic deficits are appreciated. No gait instability. Skin:  Skin is warm, dry and intact. No rash noted.   ____________________________________________   LABS (all labs ordered are listed, but only abnormal results are displayed)  Labs Reviewed  LIPASE, BLOOD - Abnormal; Notable for the following components:      Result Value   Lipase 262 (*)    All other components within normal limits  COMPREHENSIVE METABOLIC PANEL - Abnormal; Notable for the following components:   Potassium 3.2 (*)    Total Protein 8.9 (*)    AST 46 (*)    Total Bilirubin 5.9 (*)    All other components within normal limits  CBC WITH DIFFERENTIAL/PLATELET - Abnormal; Notable for the following components:   RBC 3.73 (*)    Hemoglobin 11.1 (*)    HCT 34.4 (*)    RDW 17.0 (*)    Lymphs Abs 0.4 (*)    Monocytes Absolute 1.2 (*)    Abs Immature Granulocytes 0.12 (*)    All other components within normal limits  URINALYSIS, COMPLETE (UACMP) WITH MICROSCOPIC   CBC WITH DIFFERENTIAL/PLATELET  POC URINE PREG, ED   ____________________________________________  EKG  EKG read interpreted by me shows normal sinus rhythm at 63  normal axis flipped T's in 1 to an ill. ____________________________________________  RADIOLOGY  ED MD interpretation patient pending MRI of the abdomen and pelvis  Official radiology report(s): US Abdomen Complete  Result Date: 07/02/2020 CLINICAL DATA:  RIGHT upper quadrant abdominal pain. Abnormal liver function test. EXAM: ABDOMEN ULTRASOUND COMPLETE COMPARISON:  None. FINDINGS: Gallbladder: Multiple small gallstones. No gallbladder wall thickening or pericholecystic fluid. No sonographic Murphy's sign. Common bile duct: Diameter: 3 mm Liver: No focal lesion identified. Within normal limits in parenchymal echogenicity. Portal vein is patent on color Doppler imaging with normal direction of blood flow towards the liver. IVC: No abnormality visualized. Pancreas: Visualized portion unremarkable. Spleen: Size and appearance within normal limits. Right Kidney: Length: 12 cm. Echogenicity within normal limits. No mass or hydronephrosis visualized. Left Kidney: Length: 11.1 cm. Echogenicity within normal limits. No mass or hydronephrosis visualized. Abdominal aorta: No aneurysm visualized. Other findings: Hypoechoic area within the soft tissues of the LEFT flank, overlying the LEFT kidney, measuring 3.5 x 1.2 x 2.4 cm, of uncertain etiology, without correlate appreciated on earlier CT abdomen of 04/03/2020. IMPRESSION: 1. Cholelithiasis without evidence of acute cholecystitis. 2. Liver is unremarkable.  No bile duct dilatation. 3. Indeterminate hypoechoic area within the soft tissues of the LEFT flank, overlying the LEFT kidney, measuring 3.5 x 1.2 x 2.4 cm, of uncertain etiology, and without correlate on earlier CT abdomen of 04/03/2020. This may represent chronic hematoma, less likely neoplastic, less likely infectious in the absence of  clinical symptoms or physical exam findings of a LEFT flank infection. Recommend further characterization with nonemergent CT. Electronically Signed   By: Franki Cabot M.D.   On: 07/02/2020 14:31    ____________________________________________   PROCEDURES  Procedure(s) performed (including Critical Care):  Procedures   ____________________________________________   INITIAL IMPRESSION / ASSESSMENT AND PLAN / ED COURSE  Patient signed out to oncoming provider              ____________________________________________   FINAL CLINICAL IMPRESSION(S) / ED DIAGNOSES  Final diagnoses:  Generalized abdominal pain     ED Discharge Orders    None       Note:  This document was prepared using Dragon voice recognition software and may include unintentional dictation errors.    Nena Polio, MD 07/03/20 0010

## 2020-07-03 NOTE — ED Provider Notes (Signed)
-----------------------------------------   4:36 AM on 07/03/2020 -----------------------------------------  Patient care assumed from Dr. Cinda Quest.  Patient's MRCP shows no sign of a stone or ductal dilation.  After speaking to the patient she states she is diagnosed with NASH, which is possibly the cause of the elevated bilirubin.  The remainder the LFTs are largely normal.  Patient's lipase is elevated consistent with acute pancreatitis.  I offered admission to the patient for bowel rest and IV hydration/pain control.  Patient states she would strongly prefer to be discharged home.  I spoke to the patient at length regarding a pancreatic diet.  We will prescribe pain medication for the patient.  If the pain returns/increases or the patient develops a fever she is to return to the emergency department.  Patient agreeable to plan of care.   Harvest Dark, MD 07/03/20 [phone]

## 2020-07-03 NOTE — ED Notes (Signed)
Pt to MRI

## 2020-07-03 NOTE — ED Notes (Signed)
Pt returned from CT °

## 2020-07-06 ENCOUNTER — Ambulatory Visit: Payer: Self-pay | Admitting: General Surgery

## 2020-07-06 ENCOUNTER — Other Ambulatory Visit (HOSPITAL_COMMUNITY): Payer: Self-pay | Admitting: Physical Medicine and Rehabilitation

## 2020-07-06 ENCOUNTER — Other Ambulatory Visit: Payer: Self-pay | Admitting: Physical Medicine and Rehabilitation

## 2020-07-06 DIAGNOSIS — M48062 Spinal stenosis, lumbar region with neurogenic claudication: Secondary | ICD-10-CM

## 2020-07-06 DIAGNOSIS — M5442 Lumbago with sciatica, left side: Secondary | ICD-10-CM

## 2020-07-06 DIAGNOSIS — M5441 Lumbago with sciatica, right side: Secondary | ICD-10-CM

## 2020-07-06 DIAGNOSIS — M5416 Radiculopathy, lumbar region: Secondary | ICD-10-CM

## 2020-07-06 NOTE — H&P (View-Only) (Signed)
PATIENT PROFILE: Shelly Huynh is a 49 y.o. female who presents to the Clinic for consultation at the request of Dr. Ginette Pitman for evaluation of cholelithiasis.  PCP:  Azzie Glatter, MD  HISTORY OF PRESENT ILLNESS: Shelly Huynh reports she had an episode of acute abdominal pain last week.  She went to the emergency room after waiting 4 hours she decided to leave the emergency room.  At that time labs showed elevated bilirubin to 2.  Two days later due to the severity of the pain she decided to go back to the ED.  No labs shows elevated bilirubin to 5.9 and elevated lipase to 262.  At that time MRCP was done showing no stones in the common bile duct.  Due to the improvement of the pain the patient was given the option to remain in the hospital for medical management versus going home with pain medications.  The patient decided to go home with pain medications.  She has been having milder pain in the epigastric area.  The pain radiates to the right upper quadrant.  There has been no alleviating or aggravating factor identified.  The pain is slowly getting better.  Patient denies any nausea or vomiting in the last 48 hours.  She denies any fever or chills.  Patient has been previously evaluated by GI and surgery service due to abdominal pain.  Her previous abdominal pain where nonspecific with intermittent nausea and vomiting, intermittent diarrhea and constipation.  There has been no specific localization of the pain before.  Patient is scheduled to have a colonoscopy and endoscopy this Friday.   PROBLEM LIST: Problem List  Date Reviewed: 04/22/2020       Noted   Chronic prescription opiate use 01/16/2020   Allergy to iodine 01/07/2020   History of allergy to radiographic contrast media 01/07/2020   DDD (degenerative disc disease), lumbosacral 01/07/2020   Abnormal MRI, lumbar spine 12/16/2019   Overview    FINDINGS: Alignment: Loss of the normal cervical lordosis, which can be associated with  muscle spasm. No subluxation. Vertebrae: Mild disc desiccation throughout the cervical spine. 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  Levels: C2-3: Mild left foraminal stenosis due to uncinate spurring. C3-4: Borderline central narrowing of the thecal sac due to short pedicles. Bilateral uncinate spurring. C4-5: Borderline central narrowing of the thecal sac and borderline left foraminal stenosis due to short pedicles and uncinate spurring. C5-6: Moderate left eccentric central narrowing of the thecal sac due to left paracentral disc protrusion, image 17/12. C6-7: Borderline central narrowing of the thecal sac due to short pedicles. C7-T1: Unremarkable.  IMPRESSION: 1. Moderate left eccentric central narrowing of the thecal sac at C5-6 due to a left paracentral disc protrusion. 2. Mild left foraminal stenosis at C2-3 due to uncinate spurring. 3. 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 4. Loss of the normal cervical lordosis, which can be associated with muscle spasm.  FINDINGS: 3.8 cm fluid appearing structure left aspect of the pelvis suggestive of ovarian cyst incompletely imaged/ evaluated on present exam. Atherosclerotic type changes with slight ectasia left common iliac artery. Mild curvature of the lumbar spine.  Levels: L4-5: Facet bony overgrowth. No significant spinal stenosis or foraminal narrowing. L5-S1: Prominent facet joint degenerative changes with bony overgrowth. Ligamentum flavum hypertrophy. Mild bulge and foraminal spur. Bilateral foraminal narrowing with slight encroachment upon but not significant compression of the exiting L5 nerve roots. No significant spinal stenosis.  IMPRESSION:  L5-S1 multifactorial bilateral foraminal narrowing with slight  encroachment upon but not significant compression of the exiting L5 nerve roots. Minimal change since the prior MR. 3.8 cm fluid appearing structure left aspect of the pelvis suggestive of ovarian cyst  incompletely imaged/ evaluated on present exam. Atherosclerotic type changes with slight ectasia left common iliac artery (measuring up to 1.3 cm).      Lumbar facet joint syndrome 12/16/2019   Chronic hip pain, bilateral 12/16/2019   Chronic pain of both shoulders 12/16/2019   Chronic pain of both upper extremities 12/16/2019   Bilateral chronic knee pain 12/16/2019   Recurrent occipital headache 12/16/2019   Pharmacologic therapy 12/16/2019   Problems influencing health status 12/16/2019   Disorder of skeletal system 12/16/2019   Cirrhosis of liver without ascites (CMS-HCC) 10/28/2019   Lumbar radicular pain 10/28/2019   Elevated hemoglobin A1c 10/28/2019   Neck pain, bilateral 10/22/2019   Bilateral hand numbness 10/22/2019   SOBOE (shortness of breath on exertion) 05/17/2019   BMI 40.0-44.9, adult (CMS-HCC) (Chronic) 04/10/2019   Irritable bowel syndrome with constipation 12/26/2018   Bariatric surgery status 09/12/2018   Hypertension due to endocrine disorder 08/17/2018   Hypertension, well controlled 08/14/2018   Hyperaldosteronism (CMS-HCC) 06/24/2018   Cervical radiculopathy 06/12/2018   Anxiety state 04/02/2018   Hypokalemia 12/24/2017   Pleural effusion 10/02/2017   Mitral valve disease, 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 Jude MVR 2016      Tricuspid valve disorder 09/21/2017   Overview    S/p TV repair 2016  Formatting of this note might be different from the original. S/p TV repair 2016 Formatting of this note might be different from the original. 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   Pure hypercholesterolemia 08/08/2016   Osteoarthritis of spine with radiculopathy, lumbar region 08/08/2016   Vitamin D deficiency 05/17/2016   Diabetes mellitus  type 2, diet-controlled (CMS-HCC) 05/17/2016   Spondylosis of lumbar region without myelopathy or radiculopathy 10/12/2015   Gastroesophageal reflux disease without esophagitis 10/12/2015   Nicotine dependence, uncomplicated 04/29/6788   Chronic hypokalemia 06/15/2015   Palpitations 05/18/2015   Long term current use of anticoagulants with INR goal of 2.5-3.5 05/11/2015   Mixed hyperlipidemia 05/11/2015   Chronic anticoagulation 05/07/2015   Overview    Coumadin anti-coagulation therapy mechanical heart valve prosthesis with goal INR range 2.5-3.5      Acute postoperative pain 04/29/2015   Postoperative anemia 04/26/2015   Acute post-operative pain 04/10/2015   S/P heart valve replacement with mechanical valve 04/07/2015   S/P AVR (aortic valve replacement) 04/07/2015   S/P TVR (tricuspid valve replacement) 04/07/2015   S/P MVR (mitral valve replacement) 04/07/2015   H/O mechanical aortic valve replacement 04/03/2015   Overview    19 mm Saint Jude aortic valve prosthesis      H/O mitral valve replacement with mechanical valve 04/03/2015   Overview    25 mm Saint Jude mechanical mitral valve prosthesis      History of tricuspid valve replacement with bioprosthetic valve 04/03/2015   Overview    29 mm Mosaic biologic tricuspid valve prosthesis      Presence of prosthetic heart valve 04/03/2015   Overview    29 mm Mosaic biologic tricuspid valve prosthesis      Avoidance of blood transfusions  except in life-threatening or emergency situations. 03/12/2015   Overview     See [place] for Blood Conservation Consult note.       Coronary artery disease due to calcified coronary lesion (Chronic) 03/04/2015   Overview    1. As determined by CT scan with Dr. Nehemiah Massed (Cardiology)      OSA (obstructive sleep apnea) 10/28/2014   Enlarged heart 10/28/2014   Obesity Unknown      GENERAL REVIEW OF SYSTEMS:   General ROS: negative for - chills, fatigue, fever, weight gain or weight loss Allergy  and Immunology ROS: negative for - hives  Hematological and Lymphatic ROS: negative for - bleeding problems or bruising, negative for palpable nodes Endocrine ROS: negative for - heat or cold intolerance, hair changes Respiratory ROS: negative for - cough, shortness of breath or wheezing Cardiovascular ROS: no chest pain or palpitations GI ROS: negative for nausea, vomiting, diarrhea, constipation.  Positive for abdominal pain Musculoskeletal ROS: negative for - joint swelling or muscle pain Neurological ROS: negative for - confusion, syncope Dermatological ROS: negative for pruritus and rash Psychiatric: negative for anxiety, depression, difficulty sleeping and memory loss  MEDICATIONS: Current Outpatient Medications  Medication Sig Dispense Refill  . albuterol 90 mcg/actuation inhaler Inhale 2 inhalations into the lungs every 6 (six) hours as needed for Wheezing 1 Inhaler 5  . aspirin 81 MG EC tablet Take 1 tablet (81 mg total) by mouth once daily 90 tablet 3  . blood glucose diagnostic (CONTOUR NEXT TEST STRIPS) test strip 1 each (1 strip total) by XX route 3 (three) times daily Use as instructed. 200 each 4  . candesartan (ATACAND) 8 MG tablet Take 1 tablet (8 mg total) by mouth once daily 90 tablet 3  . carvediloL (COREG) 3.125 MG tablet Take 1 tablet (3.125 mg total) by mouth 2 (two) times daily with meals 60 tablet 11  . cholecalciferol (VITAMIN D3) 1000 unit tablet Take 5,000 Units by mouth once daily    . CONTOUR NEXT METER kit by XX route as directed 1 each 0  . cyclobenzaprine (FLEXERIL) 10 MG tablet Take 1 tablet (10 mg total) by mouth 3 (three) times daily as needed 30 tablet 1  . diazePAM (VALIUM) 5 MG tablet diazepam 5 mg tablet    . dicyclomine (BENTYL) 20 mg tablet Take 1 tablet (20 mg total) by mouth every 6 (six) hours for 7 days 28 tablet 1  . docusate sodium (COLACE ORAL) Take 1 capsule by mouth once daily as needed      . fluticasone propionate (FLONASE) 50  mcg/actuation nasal spray Place 2 sprays into both nostrils 2 (two) times daily 16 mL 5  . gabapentin (NEURONTIN) 300 MG capsule Take 1 capsule (300 mg total) by mouth nightly 90 capsule 1  . gabapentin (NEURONTIN) 300 MG capsule Take by mouth    . galcanezumab-gnlm 120 mg/mL PnIj Inject 120 mg subcutaneously every 28 (twenty-eight) days 1 Syringe 6  . Herbal Supplement Blackseed Oil  1 teaspoon daily       . hydrocortisone 2.5 % cream APPLY A THIN LAYER TO FACE BID PRN FOR 1 TO 2 WEEKS    . iron polysaccharides (FERREX) 150 mg iron capsule Take 1 capsule (150 mg total) by mouth once daily 90 capsule 3  . lancing device with lancets kit Use 1 each 3 (three) times daily Use as instructed. 200 each 4  . levocetirizine (XYZAL) 5 MG tablet TAKE 1 TABLET(5 MG) BY MOUTH EVERY  EVENING 90 tablet 1  . linaCLOtide (LINZESS) 72 mcg capsule Take 1 capsule (72 mcg total) by mouth once daily 90 capsule 0  . meclizine (ANTIVERT) 25 mg tablet Take 1 tablet (25 mg total) by mouth every 6 (six) hours as needed 30 tablet 3  . memantine (NAMENDA) 5 MG tablet Take 1 tablet (5 mg total) by mouth 2 (two) times daily Follow instructions given in office per after visit summary 60 tablet 11  . minoxidiL (ROGAINE) 5 % Foam APPLY 1 ML TO SCALP DAILY    . ondansetron (ZOFRAN) 4 MG tablet Take by mouth as needed    . oxyCODONE (ROXICODONE) 5 MG immediate release tablet Take by mouth every 8 (eight) hours as needed    . pantoprazole (PROTONIX) 40 MG DR tablet TAKE 1 TABLET(40 MG) BY MOUTH TWICE DAILY 180 tablet 1  . pravastatin (PRAVACHOL) 20 MG tablet Take 1 tablet (20 mg total) by mouth once daily 90 tablet 1  . spironolactone (ALDACTONE) 25 MG tablet Take 1 tablet (25 mg total) by mouth once daily Take with 50 mg tablet = 75 mg daily. 90 tablet 1  . spironolactone (ALDACTONE) 50 MG tablet Take 1 tablet (50 mg total) by mouth once daily Take with 25 mg tablet = 75 mg daily. 90 tablet 1  . warfarin (COUMADIN) 4 MG tablet Take  1 tablet (4 mg total) by mouth once daily 30 tablet 5  . warfarin (COUMADIN) 6 MG tablet TAKE 2 TABLETS BY MOUTH  ONCE DAILY (Patient taking differently: Take 6 mg by mouth Monday-Wednesday  ) 180 tablet 2  . warfarin (COUMADIN) 7.5 MG tablet Take 1 tablet (7.5 mg total) by mouth once daily (Patient taking differently: Take 7.5 mg by mouth as directed (THURSDAY-SUNDAY)   ) 90 tablet 3  . DULoxetine (CYMBALTA) 30 MG DR capsule Take 1 capsule (30 mg total) by mouth once daily for 90 days 90 capsule 1  . levalbuterol (XOPENEX) 1.25 mg/3 mL nebulizer solution Take 3 mLs (1.25 mg total) by nebulization every 8 (eight) hours as needed for Wheezing for up to 30 days 24 ampule 5   No current facility-administered medications for this visit.    ALLERGIES: Iodinated contrast media, Sulfa (sulfonamide antibiotics), Keflex [cephalexin], Betadine [povidone-iodine], Lisinopril, Losartan, and Mucinex [guaifenesin]  PAST MEDICAL HISTORY: Past Medical History:  Diagnosis Date  . 3-vessel CAD 09/13/2014   By cat scan   . Allergy   . Anxiety   . Aortic valve stenosis and insufficiency, rheumatic 01/15/2015  . Arthritis   . Asthma without status asthmaticus, unspecified   . ATN (acute tubular necrosis) (CMS-HCC)   . CHF (congestive heart failure) (CMS-HCC)   . Chronic anticoagulation 05/07/2015   Coumadin anti-coagulation therapy mechanical heart valve prosthesis with goal INR range 2.5-3.5   . Chronic diastolic heart failure, NYHA class 2 (CMS-HCC) 03/04/2015  . Coronary artery disease due to calcified coronary lesion 03/04/2015   1. As determined by CT scan with Dr. Nehemiah Massed (Cardiology)  . Current smoker 11/10/2016  . Depression   . Diabetes mellitus type 2, diet-controlled (CMS-HCC) 05/17/2016  . Encounter for blood transfusion   . Essential hypertension 09/11/2014  . Fever in adult 07/08/2017  . GERD (gastroesophageal reflux disease)   . H/O mechanical aortic valve replacement 04/03/2015   19 mm Saint  Jude aortic valve prosthesis  . H/O mitral valve replacement with mechanical valve 04/03/2015   25 mm Saint Jude mechanical mitral valve prosthesis  . Hepatic disease   .  History of tricuspid valve replacement with bioprosthetic valve 04/03/2015   29 mm Mosaic biologic tricuspid valve prosthesis  . Hyperlipemia, mixed 09/11/2014  . Hyperlipidemia   . Hypertension   . Long term current use of anticoagulants with INR goal of 2.5-3.5 05/11/2015  . Mitral stenosis with insufficiency, 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  . Moderate tricuspid regurgitation 01/15/2015  . Obesity   . OSA (obstructive sleep apnea) 10/28/2014  . Postoperative anemia   . Postoperative infection of wound of sternum 04/24/2015  . Postoperative infection of wound of sternum, subsequent encounter 04/24/2015  . Refusal of blood transfusions as patient is Jehovah's Witness 03/12/2015  . Shortness of breath 10/28/2014  . Sleep apnea   . Tobacco abuse   . Tobacco abuse     PAST SURGICAL HISTORY: Past Surgical History:  Procedure Laterality Date  . BREAST EXCISIONAL BIOPSY Right 11/20/2019   Dr Lesli Albee  . Cardiac Catherization  12/05/2014  . CARDIAC VALVE REPLACEMENT  04/02/2015  . [place] and 2000  . HERNIA REPAIR  09/2004  . HYSTERECTOMY VAGINAL  2009  . LIPOSUCTION TRUNK  1996  . REPLACEMENT AORTIC VALVE N/A 04/03/2015   Procedure: REPLACEMENT AORTIC VALVE;  Surgeon: Elna Breslow, MD;  Location: [facility];  Service: Cardiothoracic;  Laterality: N/A;  . REPLACEMENT MITRAL VALVE N/A 04/03/2015   Procedure: REPLACEMENT MITRAL VALVE;  Surgeon: Elna Breslow, MD;  Location: [facility];  Service: Cardiothoracic;  Laterality: N/A;  . REPLACEMENT TRICUSPID VALVE N/A 04/03/2015   Procedure: REPLACEMENT TRICUSPID VALVE ;  Surgeon: Elna Breslow, MD;  Location: [facility];  Service: Cardiothoracic;  Laterality: N/A;  . STERNOTOMY MEDIAN N/A 04/03/2015   Procedure: STERNOTOMY MEDIAN;  Surgeon:  Elna Breslow, MD;  Location: [facility];  Service: Cardiothoracic;  Laterality: N/A;  . TUBAL LIGATION  08/1999  . UMBILICAL HERNIA REPAIR  2005     FAMILY HISTORY: Family History  Problem Relation Age of Onset  . Uterine cancer Mother   . Thyroid disease Mother   . Thyroid cancer Mother   . No Known Problems Father   . Coronary Artery Disease (Blocked arteries around heart) Other        grandparents  . Breast cancer Paternal Grandmother   . Coronary Artery Disease (Blocked arteries around heart) Paternal Grandmother   . High blood pressure (Hypertension) Paternal Grandmother   . Thyroid disease Paternal Grandmother   . Coronary Artery Disease (Blocked arteries around heart) Maternal Grandmother   . High blood pressure (Hypertension) Maternal Grandmother      SOCIAL HISTORY: Social History   Socioeconomic History  . Marital status: Married    Spouse name: Not on file  . Number of children: Not on file  . Years of education: Not on file  . Highest education level: Not on file  Occupational History  . Not on file  Tobacco Use  . Smoking status: Former Smoker    Packs/day: 0.25    Years: 25.00    Pack years: 6.25    Quit date: 03/19/2015    Years since quitting: 5.3  . Smokeless tobacco: Never Used  . 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  [name] married with 2 children.  Vaping Use  . Vaping Use: Never used  Substance and Sexual Activity  . Alcohol use: Never    Alcohol/week: 0.0 standard drinks    Comment: occasional, during holidays  . Drug use: No  . Sexual  activity: Defer  Other Topics Concern  . Not on file  Social History Narrative  . Not on file   Social Determinants of Health   Financial Resource Strain:   . Difficulty of Paying Living Expenses:   Food Insecurity:   . Worried About Charity fundraiser in the Last Year:   . Arboriculturist in the Last Year:   Transportation Needs:   . Film/video editor  (Medical):   Marland Kitchen Lack of Transportation (Non-Medical):     PHYSICAL EXAM: Vitals:   07/06/20 0945  BP: 140/83  Pulse: 64   Body mass index is 40.24 kg/m. Weight: 99.8 kg (220 lb)   GENERAL: Alert, active, oriented x3  HEENT: Pupils equal reactive to light. Extraocular movements are intact. Sclera clear. Palpebral conjunctiva normal red color.Pharynx clear.  NECK: Supple with no palpable mass and no adenopathy.  LUNGS: Sound clear with no rales rhonchi or wheezes.  HEART: Regular rhythm S1 and S2 without murmur.  ABDOMEN: Soft and depressible, nontender with no palpable mass, no hepatomegaly.   EXTREMITIES: Well-developed well-nourished symmetrical with no dependent edema.  NEUROLOGICAL: Awake alert oriented, facial expression symmetrical, moving all extremities.  REVIEW OF DATA: I have reviewed the following data today: Appointment on 07/06/2020  Component Date Value  . Lipase 07/06/2020 30   . Protein, Total 07/06/2020 7.8   . Albumin 07/06/2020 3.8   . Bilirubin, Total 07/06/2020 2.8*  . Bilirubin, Conjugated 07/06/2020 1.35*  . Alk Phos (alkaline Phosp* 07/06/2020 209*  . AST  07/06/2020 33   . ALT  07/06/2020 41*  Appointment on 07/02/2020  Component Date Value  . SARS-CoV2 XP 07/02/2020 Negative   Appointment on 06/25/2020  Component Date Value  . Prothrombin Time 06/25/2020 47.7*  . Prothrombin INR 06/25/2020 4.3*  Appointment on 06/11/2020  Component Date Value  . Prothrombin Time 06/11/2020 53.5*  . Prothrombin INR 06/11/2020 4.9*  Appointment on 05/28/2020  Component Date Value  . Prothrombin Time 05/28/2020 57.2*  . Prothrombin INR 05/28/2020 5.2*     ASSESSMENT: Ms. Wemhoff is a 49 y.o. female presenting for consultation for cholelithiasis with history of gallstone pancreatitis.    Patient was oriented about the diagnosis of cholelithiasis. Also oriented about what is the gallbladder, its anatomy and function and the implications of having stones /  gallbladder low ejection fraction. The patient was oriented about the treatment alternatives (observation vs cholecystectomy). Patient was oriented that a low percentage of patient will continue to have similar pain symptoms even after the gallbladder is removed. Surgical technique (open vs laparoscopic) was discussed. It was also discussed the goals of the surgery (decrease the pain episodes and avoid the risk of cholecystitis) and the risk of surgery including: bleeding, infection, common bile duct injury, stone retention, injury to other organs such as bowel, liver, stomach, other complications such as hernia, bowel obstruction among others. Also discussed with patient about anesthesia and its complications such as: reaction to medications, pneumonia, heart complications, death, among others.   Due to the patient recent episode of pancreatitis and elevated bilirubin that is trending down with a negative MRCP I think this patient will benefit of cholecystectomy.  I have reviewed.  Hepatic function panel in the morning of the surgery.  If the bilirubin normalized at that time I would not proceed with cholangiogram but if the bilirubin persist to be elevated I will proceed with intraoperative cholangiogram.  I discussed with the patient and she agreed.  Cholelithiasis without cholecystitis [  K80.20]  PLAN: 1.  Robotic assisted laparoscopic cholecystectomy with Intra-Op cholangiogram 2.  Cardiology clearance 3.  Hepatic function panel the morning of the surgery  Patient and her husband verbalized understanding, all questions were answered, and were agreeable with the plan outlined above.     Herbert Pun, MD  Electronically signed by Herbert Pun, MD

## 2020-07-06 NOTE — H&P (Signed)
PATIENT PROFILE: Shelly Huynh is a 49 y.o. female who presents to the Clinic for consultation at the request of Dr. Ginette Pitman for evaluation of cholelithiasis.  PCP:  Azzie Glatter, MD  HISTORY OF PRESENT ILLNESS: Shelly Huynh reports she had an episode of acute abdominal pain last week.  She went to the emergency room after waiting 4 hours she decided to leave the emergency room.  At that time labs showed elevated bilirubin to 2.  Two days later due to the severity of the pain she decided to go back to the ED.  No labs shows elevated bilirubin to 5.9 and elevated lipase to 262.  At that time MRCP was done showing no stones in the common bile duct.  Due to the improvement of the pain the patient was given the option to remain in the hospital for medical management versus going home with pain medications.  The patient decided to go home with pain medications.  She has been having milder pain in the epigastric area.  The pain radiates to the right upper quadrant.  There has been no alleviating or aggravating factor identified.  The pain is slowly getting better.  Patient denies any nausea or vomiting in the last 48 hours.  She denies any fever or chills.  Patient has been previously evaluated by GI and surgery service due to abdominal pain.  Her previous abdominal pain where nonspecific with intermittent nausea and vomiting, intermittent diarrhea and constipation.  There has been no specific localization of the pain before.  Patient is scheduled to have a colonoscopy and endoscopy this Friday.   PROBLEM LIST: Problem List  Date Reviewed: 04/22/2020       Noted   Chronic prescription opiate use 01/16/2020   Allergy to iodine 01/07/2020   History of allergy to radiographic contrast media 01/07/2020   DDD (degenerative disc disease), lumbosacral 01/07/2020   Abnormal MRI, lumbar spine 12/16/2019   Overview    FINDINGS: Alignment: Loss of the normal cervical lordosis, which can be associated with  muscle spasm. No subluxation. Vertebrae: Mild disc desiccation throughout the cervical spine. 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  Levels: C2-3: Mild left foraminal stenosis due to uncinate spurring. C3-4: Borderline central narrowing of the thecal sac due to short pedicles. Bilateral uncinate spurring. C4-5: Borderline central narrowing of the thecal sac and borderline left foraminal stenosis due to short pedicles and uncinate spurring. C5-6: Moderate left eccentric central narrowing of the thecal sac due to left paracentral disc protrusion, image 17/12. C6-7: Borderline central narrowing of the thecal sac due to short pedicles. C7-T1: Unremarkable.  IMPRESSION: 1. Moderate left eccentric central narrowing of the thecal sac at C5-6 due to a left paracentral disc protrusion. 2. Mild left foraminal stenosis at C2-3 due to uncinate spurring. 3. 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 4. Loss of the normal cervical lordosis, which can be associated with muscle spasm.  FINDINGS: 3.8 cm fluid appearing structure left aspect of the pelvis suggestive of ovarian cyst incompletely imaged/ evaluated on present exam. Atherosclerotic type changes with slight ectasia left common iliac artery. Mild curvature of the lumbar spine.  Levels: L4-5: Facet bony overgrowth. No significant spinal stenosis or foraminal narrowing. L5-S1: Prominent facet joint degenerative changes with bony overgrowth. Ligamentum flavum hypertrophy. Mild bulge and foraminal spur. Bilateral foraminal narrowing with slight encroachment upon but not significant compression of the exiting L5 nerve roots. No significant spinal stenosis.  IMPRESSION:  L5-S1 multifactorial bilateral foraminal narrowing with slight  encroachment upon but not significant compression of the exiting L5 nerve roots. Minimal change since the prior MR. 3.8 cm fluid appearing structure left aspect of the pelvis suggestive of ovarian cyst  incompletely imaged/ evaluated on present exam. Atherosclerotic type changes with slight ectasia left common iliac artery (measuring up to 1.3 cm).      Lumbar facet joint syndrome 12/16/2019   Chronic hip pain, bilateral 12/16/2019   Chronic pain of both shoulders 12/16/2019   Chronic pain of both upper extremities 12/16/2019   Bilateral chronic knee pain 12/16/2019   Recurrent occipital headache 12/16/2019   Pharmacologic therapy 12/16/2019   Problems influencing health status 12/16/2019   Disorder of skeletal system 12/16/2019   Cirrhosis of liver without ascites (CMS-HCC) 10/28/2019   Lumbar radicular pain 10/28/2019   Elevated hemoglobin A1c 10/28/2019   Neck pain, bilateral 10/22/2019   Bilateral hand numbness 10/22/2019   SOBOE (shortness of breath on exertion) 05/17/2019   BMI 40.0-44.9, adult (CMS-HCC) (Chronic) 04/10/2019   Irritable bowel syndrome with constipation 12/26/2018   Bariatric surgery status 09/12/2018   Hypertension due to endocrine disorder 08/17/2018   Hypertension, well controlled 08/14/2018   Hyperaldosteronism (CMS-HCC) 06/24/2018   Cervical radiculopathy 06/12/2018   Anxiety state 04/02/2018   Hypokalemia 12/24/2017   Pleural effusion 10/02/2017   Mitral valve disease, 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 Jude MVR 2016      Tricuspid valve disorder 09/21/2017   Overview    S/p TV repair 2016  Formatting of this note might be different from the original. S/p TV repair 2016 Formatting of this note might be different from the original. 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   Pure hypercholesterolemia 08/08/2016   Osteoarthritis of spine with radiculopathy, lumbar region 08/08/2016   Vitamin D deficiency 05/17/2016   Diabetes mellitus  type 2, diet-controlled (CMS-HCC) 05/17/2016   Spondylosis of lumbar region without myelopathy or radiculopathy 10/12/2015   Gastroesophageal reflux disease without esophagitis 10/12/2015   Nicotine dependence, uncomplicated 04/29/6788   Chronic hypokalemia 06/15/2015   Palpitations 05/18/2015   Long term current use of anticoagulants with INR goal of 2.5-3.5 05/11/2015   Mixed hyperlipidemia 05/11/2015   Chronic anticoagulation 05/07/2015   Overview    Coumadin anti-coagulation therapy mechanical heart valve prosthesis with goal INR range 2.5-3.5      Acute postoperative pain 04/29/2015   Postoperative anemia 04/26/2015   Acute post-operative pain 04/10/2015   S/P heart valve replacement with mechanical valve 04/07/2015   S/P AVR (aortic valve replacement) 04/07/2015   S/P TVR (tricuspid valve replacement) 04/07/2015   S/P MVR (mitral valve replacement) 04/07/2015   H/O mechanical aortic valve replacement 04/03/2015   Overview    19 mm Saint Jude aortic valve prosthesis      H/O mitral valve replacement with mechanical valve 04/03/2015   Overview    25 mm Saint Jude mechanical mitral valve prosthesis      History of tricuspid valve replacement with bioprosthetic valve 04/03/2015   Overview    29 mm Mosaic biologic tricuspid valve prosthesis      Presence of prosthetic heart valve 04/03/2015   Overview    29 mm Mosaic biologic tricuspid valve prosthesis      Avoidance of blood transfusions  except in life-threatening or emergency situations. 03/12/2015   Overview     See [place] for Blood Conservation Consult note.       Coronary artery disease due to calcified coronary lesion (Chronic) 03/04/2015   Overview    1. As determined by CT scan with Dr. Nehemiah Massed (Cardiology)      OSA (obstructive sleep apnea) 10/28/2014   Enlarged heart 10/28/2014   Obesity Unknown      GENERAL REVIEW OF SYSTEMS:   General ROS: negative for - chills, fatigue, fever, weight gain or weight loss Allergy  and Immunology ROS: negative for - hives  Hematological and Lymphatic ROS: negative for - bleeding problems or bruising, negative for palpable nodes Endocrine ROS: negative for - heat or cold intolerance, hair changes Respiratory ROS: negative for - cough, shortness of breath or wheezing Cardiovascular ROS: no chest pain or palpitations GI ROS: negative for nausea, vomiting, diarrhea, constipation.  Positive for abdominal pain Musculoskeletal ROS: negative for - joint swelling or muscle pain Neurological ROS: negative for - confusion, syncope Dermatological ROS: negative for pruritus and rash Psychiatric: negative for anxiety, depression, difficulty sleeping and memory loss  MEDICATIONS: Current Outpatient Medications  Medication Sig Dispense Refill  . albuterol 90 mcg/actuation inhaler Inhale 2 inhalations into the lungs every 6 (six) hours as needed for Wheezing 1 Inhaler 5  . aspirin 81 MG EC tablet Take 1 tablet (81 mg total) by mouth once daily 90 tablet 3  . blood glucose diagnostic (CONTOUR NEXT TEST STRIPS) test strip 1 each (1 strip total) by XX route 3 (three) times daily Use as instructed. 200 each 4  . candesartan (ATACAND) 8 MG tablet Take 1 tablet (8 mg total) by mouth once daily 90 tablet 3  . carvediloL (COREG) 3.125 MG tablet Take 1 tablet (3.125 mg total) by mouth 2 (two) times daily with meals 60 tablet 11  . cholecalciferol (VITAMIN D3) 1000 unit tablet Take 5,000 Units by mouth once daily    . CONTOUR NEXT METER kit by XX route as directed 1 each 0  . cyclobenzaprine (FLEXERIL) 10 MG tablet Take 1 tablet (10 mg total) by mouth 3 (three) times daily as needed 30 tablet 1  . diazePAM (VALIUM) 5 MG tablet diazepam 5 mg tablet    . dicyclomine (BENTYL) 20 mg tablet Take 1 tablet (20 mg total) by mouth every 6 (six) hours for 7 days 28 tablet 1  . docusate sodium (COLACE ORAL) Take 1 capsule by mouth once daily as needed      . fluticasone propionate (FLONASE) 50  mcg/actuation nasal spray Place 2 sprays into both nostrils 2 (two) times daily 16 mL 5  . gabapentin (NEURONTIN) 300 MG capsule Take 1 capsule (300 mg total) by mouth nightly 90 capsule 1  . gabapentin (NEURONTIN) 300 MG capsule Take by mouth    . galcanezumab-gnlm 120 mg/mL PnIj Inject 120 mg subcutaneously every 28 (twenty-eight) days 1 Syringe 6  . Herbal Supplement Blackseed Oil  1 teaspoon daily       . hydrocortisone 2.5 % cream APPLY A THIN LAYER TO FACE BID PRN FOR 1 TO 2 WEEKS    . iron polysaccharides (FERREX) 150 mg iron capsule Take 1 capsule (150 mg total) by mouth once daily 90 capsule 3  . lancing device with lancets kit Use 1 each 3 (three) times daily Use as instructed. 200 each 4  . levocetirizine (XYZAL) 5 MG tablet TAKE 1 TABLET(5 MG) BY MOUTH EVERY  EVENING 90 tablet 1  . linaCLOtide (LINZESS) 72 mcg capsule Take 1 capsule (72 mcg total) by mouth once daily 90 capsule 0  . meclizine (ANTIVERT) 25 mg tablet Take 1 tablet (25 mg total) by mouth every 6 (six) hours as needed 30 tablet 3  . memantine (NAMENDA) 5 MG tablet Take 1 tablet (5 mg total) by mouth 2 (two) times daily Follow instructions given in office per after visit summary 60 tablet 11  . minoxidiL (ROGAINE) 5 % Foam APPLY 1 ML TO SCALP DAILY    . ondansetron (ZOFRAN) 4 MG tablet Take by mouth as needed    . oxyCODONE (ROXICODONE) 5 MG immediate release tablet Take by mouth every 8 (eight) hours as needed    . pantoprazole (PROTONIX) 40 MG DR tablet TAKE 1 TABLET(40 MG) BY MOUTH TWICE DAILY 180 tablet 1  . pravastatin (PRAVACHOL) 20 MG tablet Take 1 tablet (20 mg total) by mouth once daily 90 tablet 1  . spironolactone (ALDACTONE) 25 MG tablet Take 1 tablet (25 mg total) by mouth once daily Take with 50 mg tablet = 75 mg daily. 90 tablet 1  . spironolactone (ALDACTONE) 50 MG tablet Take 1 tablet (50 mg total) by mouth once daily Take with 25 mg tablet = 75 mg daily. 90 tablet 1  . warfarin (COUMADIN) 4 MG tablet Take  1 tablet (4 mg total) by mouth once daily 30 tablet 5  . warfarin (COUMADIN) 6 MG tablet TAKE 2 TABLETS BY MOUTH  ONCE DAILY (Patient taking differently: Take 6 mg by mouth Monday-Wednesday  ) 180 tablet 2  . warfarin (COUMADIN) 7.5 MG tablet Take 1 tablet (7.5 mg total) by mouth once daily (Patient taking differently: Take 7.5 mg by mouth as directed (THURSDAY-SUNDAY)   ) 90 tablet 3  . DULoxetine (CYMBALTA) 30 MG DR capsule Take 1 capsule (30 mg total) by mouth once daily for 90 days 90 capsule 1  . levalbuterol (XOPENEX) 1.25 mg/3 mL nebulizer solution Take 3 mLs (1.25 mg total) by nebulization every 8 (eight) hours as needed for Wheezing for up to 30 days 24 ampule 5   No current facility-administered medications for this visit.    ALLERGIES: Iodinated contrast media, Sulfa (sulfonamide antibiotics), Keflex [cephalexin], Betadine [povidone-iodine], Lisinopril, Losartan, and Mucinex [guaifenesin]  PAST MEDICAL HISTORY: Past Medical History:  Diagnosis Date  . 3-vessel CAD 09/13/2014   By cat scan   . Allergy   . Anxiety   . Aortic valve stenosis and insufficiency, rheumatic 01/15/2015  . Arthritis   . Asthma without status asthmaticus, unspecified   . ATN (acute tubular necrosis) (CMS-HCC)   . CHF (congestive heart failure) (CMS-HCC)   . Chronic anticoagulation 05/07/2015   Coumadin anti-coagulation therapy mechanical heart valve prosthesis with goal INR range 2.5-3.5   . Chronic diastolic heart failure, NYHA class 2 (CMS-HCC) 03/04/2015  . Coronary artery disease due to calcified coronary lesion 03/04/2015   1. As determined by CT scan with Dr. Nehemiah Massed (Cardiology)  . Current smoker 11/10/2016  . Depression   . Diabetes mellitus type 2, diet-controlled (CMS-HCC) 05/17/2016  . Encounter for blood transfusion   . Essential hypertension 09/11/2014  . Fever in adult 07/08/2017  . GERD (gastroesophageal reflux disease)   . H/O mechanical aortic valve replacement 04/03/2015   19 mm Saint  Jude aortic valve prosthesis  . H/O mitral valve replacement with mechanical valve 04/03/2015   25 mm Saint Jude mechanical mitral valve prosthesis  . Hepatic disease   .  History of tricuspid valve replacement with bioprosthetic valve 04/03/2015   29 mm Mosaic biologic tricuspid valve prosthesis  . Hyperlipemia, mixed 09/11/2014  . Hyperlipidemia   . Hypertension   . Long term current use of anticoagulants with INR goal of 2.5-3.5 05/11/2015  . Mitral stenosis with insufficiency, 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  . Moderate tricuspid regurgitation 01/15/2015  . Obesity   . OSA (obstructive sleep apnea) 10/28/2014  . Postoperative anemia   . Postoperative infection of wound of sternum 04/24/2015  . Postoperative infection of wound of sternum, subsequent encounter 04/24/2015  . Refusal of blood transfusions as patient is Jehovah's Witness 03/12/2015  . Shortness of breath 10/28/2014  . Sleep apnea   . Tobacco abuse   . Tobacco abuse     PAST SURGICAL HISTORY: Past Surgical History:  Procedure Laterality Date  . BREAST EXCISIONAL BIOPSY Right 11/20/2019   Dr Lesli Albee  . Cardiac Catherization  12/05/2014  . CARDIAC VALVE REPLACEMENT  04/02/2015  . [place] and 2000  . HERNIA REPAIR  09/2004  . HYSTERECTOMY VAGINAL  2009  . LIPOSUCTION TRUNK  1996  . REPLACEMENT AORTIC VALVE N/A 04/03/2015   Procedure: REPLACEMENT AORTIC VALVE;  Surgeon: Elna Breslow, MD;  Location: [facility];  Service: Cardiothoracic;  Laterality: N/A;  . REPLACEMENT MITRAL VALVE N/A 04/03/2015   Procedure: REPLACEMENT MITRAL VALVE;  Surgeon: Elna Breslow, MD;  Location: [facility];  Service: Cardiothoracic;  Laterality: N/A;  . REPLACEMENT TRICUSPID VALVE N/A 04/03/2015   Procedure: REPLACEMENT TRICUSPID VALVE ;  Surgeon: Elna Breslow, MD;  Location: [facility];  Service: Cardiothoracic;  Laterality: N/A;  . STERNOTOMY MEDIAN N/A 04/03/2015   Procedure: STERNOTOMY MEDIAN;  Surgeon:  Elna Breslow, MD;  Location: [facility];  Service: Cardiothoracic;  Laterality: N/A;  . TUBAL LIGATION  08/1999  . UMBILICAL HERNIA REPAIR  2005     FAMILY HISTORY: Family History  Problem Relation Age of Onset  . Uterine cancer Mother   . Thyroid disease Mother   . Thyroid cancer Mother   . No Known Problems Father   . Coronary Artery Disease (Blocked arteries around heart) Other        grandparents  . Breast cancer Paternal Grandmother   . Coronary Artery Disease (Blocked arteries around heart) Paternal Grandmother   . High blood pressure (Hypertension) Paternal Grandmother   . Thyroid disease Paternal Grandmother   . Coronary Artery Disease (Blocked arteries around heart) Maternal Grandmother   . High blood pressure (Hypertension) Maternal Grandmother      SOCIAL HISTORY: Social History   Socioeconomic History  . Marital status: Married    Spouse name: Not on file  . Number of children: Not on file  . Years of education: Not on file  . Highest education level: Not on file  Occupational History  . Not on file  Tobacco Use  . Smoking status: Former Smoker    Packs/day: 0.25    Years: 25.00    Pack years: 6.25    Quit date: 03/19/2015    Years since quitting: 5.3  . Smokeless tobacco: Never Used  . 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  [name] married with 2 children.  Vaping Use  . Vaping Use: Never used  Substance and Sexual Activity  . Alcohol use: Never    Alcohol/week: 0.0 standard drinks    Comment: occasional, during holidays  . Drug use: No  . Sexual  activity: Defer  Other Topics Concern  . Not on file  Social History Narrative  . Not on file   Social Determinants of Health   Financial Resource Strain:   . Difficulty of Paying Living Expenses:   Food Insecurity:   . Worried About Charity fundraiser in the Last Year:   . Arboriculturist in the Last Year:   Transportation Needs:   . Film/video editor  (Medical):   Marland Kitchen Lack of Transportation (Non-Medical):     PHYSICAL EXAM: Vitals:   07/06/20 0945  BP: 140/83  Pulse: 64   Body mass index is 40.24 kg/m. Weight: 99.8 kg (220 lb)   GENERAL: Alert, active, oriented x3  HEENT: Pupils equal reactive to light. Extraocular movements are intact. Sclera clear. Palpebral conjunctiva normal red color.Pharynx clear.  NECK: Supple with no palpable mass and no adenopathy.  LUNGS: Sound clear with no rales rhonchi or wheezes.  HEART: Regular rhythm S1 and S2 without murmur.  ABDOMEN: Soft and depressible, nontender with no palpable mass, no hepatomegaly.   EXTREMITIES: Well-developed well-nourished symmetrical with no dependent edema.  NEUROLOGICAL: Awake alert oriented, facial expression symmetrical, moving all extremities.  REVIEW OF DATA: I have reviewed the following data today: Appointment on 07/06/2020  Component Date Value  . Lipase 07/06/2020 30   . Protein, Total 07/06/2020 7.8   . Albumin 07/06/2020 3.8   . Bilirubin, Total 07/06/2020 2.8*  . Bilirubin, Conjugated 07/06/2020 1.35*  . Alk Phos (alkaline Phosp* 07/06/2020 209*  . AST  07/06/2020 33   . ALT  07/06/2020 41*  Appointment on 07/02/2020  Component Date Value  . SARS-CoV2 XP 07/02/2020 Negative   Appointment on 06/25/2020  Component Date Value  . Prothrombin Time 06/25/2020 47.7*  . Prothrombin INR 06/25/2020 4.3*  Appointment on 06/11/2020  Component Date Value  . Prothrombin Time 06/11/2020 53.5*  . Prothrombin INR 06/11/2020 4.9*  Appointment on 05/28/2020  Component Date Value  . Prothrombin Time 05/28/2020 57.2*  . Prothrombin INR 05/28/2020 5.2*     ASSESSMENT: Ms. Wemhoff is a 49 y.o. female presenting for consultation for cholelithiasis with history of gallstone pancreatitis.    Patient was oriented about the diagnosis of cholelithiasis. Also oriented about what is the gallbladder, its anatomy and function and the implications of having stones /  gallbladder low ejection fraction. The patient was oriented about the treatment alternatives (observation vs cholecystectomy). Patient was oriented that a low percentage of patient will continue to have similar pain symptoms even after the gallbladder is removed. Surgical technique (open vs laparoscopic) was discussed. It was also discussed the goals of the surgery (decrease the pain episodes and avoid the risk of cholecystitis) and the risk of surgery including: bleeding, infection, common bile duct injury, stone retention, injury to other organs such as bowel, liver, stomach, other complications such as hernia, bowel obstruction among others. Also discussed with patient about anesthesia and its complications such as: reaction to medications, pneumonia, heart complications, death, among others.   Due to the patient recent episode of pancreatitis and elevated bilirubin that is trending down with a negative MRCP I think this patient will benefit of cholecystectomy.  I have reviewed.  Hepatic function panel in the morning of the surgery.  If the bilirubin normalized at that time I would not proceed with cholangiogram but if the bilirubin persist to be elevated I will proceed with intraoperative cholangiogram.  I discussed with the patient and she agreed.  Cholelithiasis without cholecystitis [  K80.20]  PLAN: 1.  Robotic assisted laparoscopic cholecystectomy with Intra-Op cholangiogram 2.  Cardiology clearance 3.  Hepatic function panel the morning of the surgery  Patient and her husband verbalized understanding, all questions were answered, and were agreeable with the plan outlined above.     Herbert Pun, MD  Electronically signed by Herbert Pun, MD

## 2020-07-08 ENCOUNTER — Other Ambulatory Visit
Admission: RE | Admit: 2020-07-08 | Discharge: 2020-07-08 | Disposition: A | Discharge status: Home / Self Care | Payer: 59 | Source: Ambulatory Visit | Attending: General Surgery | Admitting: General Surgery

## 2020-07-08 ENCOUNTER — Other Ambulatory Visit: Payer: Self-pay

## 2020-07-08 DIAGNOSIS — Z01812 Encounter for preprocedural laboratory examination: Secondary | ICD-10-CM | POA: Insufficient documentation

## 2020-07-08 DIAGNOSIS — Z20822 Contact with and (suspected) exposure to covid-19: Secondary | ICD-10-CM | POA: Insufficient documentation

## 2020-07-08 LAB — SARS CORONAVIRUS 2 (TAT 6-24 HRS): SARS Coronavirus 2: NEGATIVE

## 2020-07-09 ENCOUNTER — Encounter
Admission: RE | Admit: 2020-07-09 | Discharge: 2020-07-09 | Disposition: A | Discharge status: Home / Self Care | Payer: 59 | Source: Ambulatory Visit | Attending: General Surgery | Admitting: General Surgery

## 2020-07-09 ENCOUNTER — Encounter: Payer: Self-pay | Admitting: General Surgery

## 2020-07-09 ENCOUNTER — Other Ambulatory Visit: Payer: Self-pay

## 2020-07-09 HISTORY — DX: Personal history of other diseases of the digestive system: Z87.19

## 2020-07-09 HISTORY — DX: Cardiac arrhythmia, unspecified: I49.9

## 2020-07-09 HISTORY — DX: Headache, unspecified: R51.9

## 2020-07-09 HISTORY — DX: Anemia, unspecified: D64.9

## 2020-07-10 ENCOUNTER — Ambulatory Visit: Admission: RE | Admit: 2020-07-10 | Payer: 59 | Source: Home / Self Care | Admitting: General Surgery

## 2020-07-10 ENCOUNTER — Encounter: Payer: Self-pay | Admitting: General Surgery

## 2020-07-10 ENCOUNTER — Encounter: Admission: RE | Payer: Self-pay | Source: Home / Self Care

## 2020-07-10 DIAGNOSIS — Z01812 Encounter for preprocedural laboratory examination: Secondary | ICD-10-CM | POA: Diagnosis not present

## 2020-07-10 HISTORY — DX: Other specified bacterial intestinal infections: A04.8

## 2020-07-10 HISTORY — DX: Dyspnea, unspecified: R06.00

## 2020-07-10 HISTORY — DX: Hyperaldosteronism, unspecified: E26.9

## 2020-07-10 HISTORY — DX: Unspecified cirrhosis of liver: K74.60

## 2020-07-10 HISTORY — DX: Sleep apnea, unspecified: G47.30

## 2020-07-10 HISTORY — DX: Esophagitis, unspecified without bleeding: K20.90

## 2020-07-10 HISTORY — DX: Long term (current) use of anticoagulants: Z79.01

## 2020-07-10 HISTORY — DX: Atherosclerotic heart disease of native coronary artery without angina pectoris: I25.10

## 2020-07-10 HISTORY — DX: Other chronic pain: G89.29

## 2020-07-10 HISTORY — DX: Unspecified asthma, uncomplicated: J45.909

## 2020-07-10 SURGERY — COLONOSCOPY WITH PROPOFOL
Anesthesia: General

## 2020-07-10 NOTE — Patient Instructions (Addendum)
Your procedure is scheduled on: 07-14-20 TUESDAY Report to Same Day Surgery 2nd floor medical mall [facility] Entrance-take elevator on left to 2nd floor.  Check in with surgery information desk.) To find out your arrival time please call [phone] between 1PM - 3PM on 07-13-20 MONDAY  Remember: Instructions that are not followed completely may result in serious medical risk, up to and including death, or upon the discretion of your surgeon and anesthesiologist your surgery may need to be rescheduled.    _x___ 1. Do not eat food after midnight the night before your procedure. NO GUM OR CANDY AFTER MIDNIGHT. You may drink WATER up to 2 hours before you are scheduled to arrive at the hospital for your procedure.  Do not drink WATER within 2 hours of your scheduled arrival to the hospital.  Type 1 and type 2 diabetics should only drink water.    __x__ 2. No Alcohol for 24 hours before or after surgery.   __x__3. No Smoking or e-cigarettes for 24 prior to surgery.  Do not use any chewable tobacco products for at least 6 hour prior to surgery   ____  4. Bring all medications with you on the day of surgery if instructed.    __x__ 5. Notify your doctor if there is any change in your medical condition     (cold, fever, infections).    x___6. On the morning of surgery brush your teeth with toothpaste and water.  You may rinse your mouth with mouth wash if you wish.  Do not swallow any toothpaste or mouthwash.   Do not wear jewelry, make-up, hairpins, clips or nail polish.  Do not wear lotions, powders, or perfumes.   Do not shave 48 hours prior to surgery. Men may shave face and neck.  Do not bring valuables to the hospital.    [facility] is not responsible for any belongings or valuables.               Contacts, dentures or bridgework may not be worn into surgery.  Leave your suitcase in the car. After surgery it may be brought to your room.  For patients admitted to the hospital,  discharge time is determined by your  treatment team.  _  Patients discharged the day of surgery will not be allowed to drive home.  You will need someone to drive you home and stay with you the night of your procedure.    Please read over the following fact sheets that you were given:   [facility] Preparing for Surgery    _x___ TAKE THE FOLLOWING MEDICATION THE MORNING OF SURGERY WITH A SMALL SIP OF WATER. These include:  1. COREG (CARVEDILOL)  2. PROTONIX (PANTOPRAZOLE)  3.  4.  5.  6.  ____Fleets enema or Magnesium Citrate as directed.   _x___ Use CHG [place] directed on instruction sheet   _X___ Use inhalers on the day of surgery and bring to hospital day of [place] AND BRING YOUR ALBUTEROL INHALER TO [place]  ____ Stop Metformin and Janumet 2 days prior to surgery.    ____ Take 1/2 of usual insulin dose the night before surgery and none on the morning surgery.   _x___ Follow recommendations from Cardiologist, Pulmonologist or PCP regarding stopping Aspirin, Coumadin, Plavix ,Eliquis, Effient, or Pradaxa, and Pletal-STOPPED ASPIRIN AND COUMADIN (WARFARIN) ON  07-05-20 IN PREPARATION FOR HER COLONOSCOPY ON 8-20 AND HER CHOLECYSTECTOMY ON 8-24-PT IS ON LOVENOX  BRIDGE CURRENTLY  X____Stop Anti-inflammatories such as Advil, Aleve, Ibuprofen, Motrin, Naproxen, Naprosyn, Goodies powders or aspirin products NOW-OK to take Tylenol OR OXYCODONE IF NEEDED   _x___ Stop supplements until after surgery-LAST DOSE OF FLAX SEED OIL WAS LAST WEEK   _X___ Bring C-Pap to the hospital.

## 2020-07-13 ENCOUNTER — Other Ambulatory Visit: Payer: Self-pay

## 2020-07-13 ENCOUNTER — Other Ambulatory Visit
Admission: RE | Admit: 2020-07-13 | Discharge: 2020-07-13 | Disposition: A | Discharge status: Home / Self Care | Payer: 59 | Source: Ambulatory Visit | Attending: General Surgery | Admitting: General Surgery

## 2020-07-13 DIAGNOSIS — Z20822 Contact with and (suspected) exposure to covid-19: Secondary | ICD-10-CM | POA: Diagnosis not present

## 2020-07-13 DIAGNOSIS — Z01812 Encounter for preprocedural laboratory examination: Secondary | ICD-10-CM | POA: Diagnosis present

## 2020-07-13 LAB — SARS CORONAVIRUS 2 (TAT 6-24 HRS): SARS Coronavirus 2: NEGATIVE

## 2020-07-13 LAB — POTASSIUM: Potassium: 3.9 mmol/L (ref 3.5–5.1)

## 2020-07-13 NOTE — Progress Notes (Signed)
[facility] Perioperative Services  Pre-Admission/Anesthesia Testing Clinical Review  Date: 07/13/20  Patient Demographics:  Name: Shelly Huynh DOB:   [birth year] MRN:   [id number]  Planned Surgical Procedure(s):    Case: [id number] Date/Time: 07/14/20 1057   Procedure: XI ROBOTIC ASSISTED LAPAROSCOPIC CHOLECYSTECTOMY W/ cholangiogram (N/A Abdomen)   Anesthesia type: General   Pre-op diagnosis: K80.20 Cholelithiasis w/o cholecystitis   Location: [facility] OR ROOM 06 / [facility] ORS FOR ANESTHESIA GROUP   Surgeons: Herbert Pun, MD     NOTE: Available PAT nursing documentation and vital signs have been reviewed. Clinical nursing staff has updated patient's PMH/PSHx, current medication list, and drug allergies/intolerances to ensure comprehensive history available to assist in medical decision making as it pertains to the aforementioned surgical procedure and anticipated anesthetic course.   Clinical Discussion:  Shelly Huynh is a 49 y.o. female who is submitted for pre-surgical anesthesia review and clearance prior to her undergoing the above procedure. Patient is a Former Smoker (12.5 pack years; quit 02/2015). Pertinent PMH includes: CAD, angina, aortic/tricuspid/mitral valve replacements, paroxysmal A. Fib, valvular cardiomyopathy, cardiomegaly, post SARS-CoV-2 vaccine palpitations, peripheral edema, HTN 2/2 hyperaldosteronism, HLD, Q1FX, combined systolic and diastolic CHF, DOE, asthma, GERD with hiatal hernia (on daily PPI), anemia, OSAH (requires nocturnal PAP therapy), MRSA, chronic pain syndrome, cervical foraminal stenosis, lumbosacral DDD, OA, chronic prescription opioid use, blood product refusal ([employer]), anxiety, depression.  Patient is followed by cardiology Nehemiah Massed, MD). She was last seen in the cardiology clinic on 07/13/2020 notes reviewed. At the time of her visit the patient denied any angina, shortness of breath, orthopnea, PND,  dizziness, or presyncope/syncope. Last TTE in 06/2019 revealed an LVEF of 55% (see below for full results). Function capacity documented as being > 4 METS. HTN managed on currently prescribed beta blocker and K+ sparing diuretic therapy. [name] on a statin for her HLD. [name] on daily anticoagulation for A.fib. Patient takes warfarin. Pending upcoming cholecystectomy, patient is off of her warfarin and is on an enoxaparin bridge due to her  high CHA2DS2-VASc score. Per cardiology, "overall risk of cardiac complication with surgery and/or procedure is low (<1%). Proceed to surgery and/or invasive procedure without restriction to pre or post operative and/or procedural care. The patient is at lowest risk possible for cardiovascular complications with surgical intervention and/or invasive procedure".  She denies previous intra-operative complications with anesthesia. She underwent a general (LMA) anesthetic course here (ASA III) in 10/2019 with no documented complications.   Vitals with BMI 07/03/2020 07/03/2020 07/03/2020  Height - - -  Weight - - -  BMI - - -  Systolic 588 325 498  Diastolic 48 83 53  Pulse 57 58 55    Providers/Specialists:   NOTE: Primary physician provider listed below. Patient may have been seen by APP or partner within same practice.   PROVIDER ROLE LAST Tanna Savoy, MD General Surgery 07/06/2020  Tracie Harrier, MD Primary Care Provider 06/30/2020  Serafina Royals, MD Cardiology 07/13/2020   Allergies:  Ivp dye [iodinated diagnostic agents], Sulfa antibiotics, Banana, Guaifenesin, Hctz [hydrochlorothiazide], Keflex [cephalexin], Losartan, Povidone iodine, Lisinopril, and Mucinex [guaifenesin er]  Current Home Medications:   No current facility-administered medications for this encounter.   Marland Kitchen aspirin EC 81 MG tablet  . candesartan (ATACAND) 8 MG tablet  . carvedilol (COREG) 6.25 MG tablet  . Cholecalciferol 25 MCG (1000 UT) tablet  . cyclobenzaprine  (FLEXERIL) 10 MG tablet  . diazepam (VALIUM) 5 MG tablet  .  DULoxetine (CYMBALTA) 30 MG capsule  . enoxaparin (LOVENOX) 120 MG/0.8ML injection  . fluticasone (FLONASE) 50 MCG/ACT nasal spray  . gabapentin (NEURONTIN) 300 MG capsule  . Galcanezumab-gnlm (EMGALITY [place])  . Galcanezumab-gnlm 120 MG/ML SOSY  . hydrocortisone 2.5 % cream  . iron polysaccharides (FERREX 150) 150 MG capsule  . levalbuterol (XOPENEX) 1.25 MG/0.5ML nebulizer solution  . levocetirizine (XYZAL) 5 MG tablet  . linaclotide (LINZESS) 72 MCG capsule  . meclizine (ANTIVERT) 25 MG tablet  . memantine (NAMENDA) 5 MG tablet  . Minoxidil 5 % FOAM  . ondansetron (ZOFRAN) 4 MG tablet  . oxyCODONE (ROXICODONE) 5 MG immediate release tablet  . pantoprazole (PROTONIX) 40 MG tablet  . pravastatin (PRAVACHOL) 20 MG tablet  . spironolactone (ALDACTONE) 25 MG tablet  . spironolactone (ALDACTONE) 50 MG tablet  . warfarin (COUMADIN) 4 MG tablet  . albuterol (VENTOLIN HFA) 108 (90 Base) MCG/ACT inhaler  . docusate sodium (COLACE) 100 MG capsule  . Flaxseed, Linseed, (FLAX SEED OIL PO)  . warfarin (COUMADIN) 7.5 MG tablet   History:   Past Medical History:  Diagnosis Date  . Acid reflux   . Anemia   . Anxiety   . Arthritis    OSTEO  . Asthma   . CHF (congestive heart failure) ([place])   . Chronic anticoagulation   . Chronic pain   . Cirrhosis ([place])    NASH  . Coronary artery disease   . Depression   . Diabetes mellitus without complication ([place])   . Dyspnea   . Dysrhythmia    AFTER COVID VACCINE ARRYTHMIAS-SEEING [place]   . Esophagitis   . Headache    MIGRAINES  . Heart valve replaced   . Helicobacter pylori infection   . High cholesterol   . High cholesterol   . History of hiatal hernia   . History of methicillin resistant staphylococcus aureus (MRSA)   . Hyperaldosteronism ([place])   . Hypertension   . Hypokalemia   . IBS (irritable bowel syndrome)   . IBS (irritable bowel syndrome)   . Sciatic pain   .  Sleep apnea    USES CPAP   Past Surgical History:  Procedure Laterality Date  . ABDOMINAL HYSTERECTOMY    . AORTIC VALVE REPLACEMENT    . BREAST BIOPSY Left 07/25/2017   BENIGN BREAST TISSUE WITH MICROCALCIFICATIONS  . BREAST BIOPSY Right 10/22/2019   Affirm Bx- X-clip- CSL, no atypia  . BREAST BIOPSY Right 10/22/2019   Affirm Bx- Ribbon clip- CSL, no atypia  . BREAST BIOPSY Right 11/20/2019   Procedure: BREAST BIOPSY WITH NEEDLE LOCALIZATION x 2;  Surgeon: Herbert Pun, MD;  Location: [facility] ORS;  Service: General;  Laterality: Right;  . BREAST EXCISIONAL BIOPSY Right 1992   NEG  . BREAST LUMPECTOMY Right 11/20/2019   complex sclerosing lesion x 2  . CARDIAC VALVE SURGERY    . CESAREAN SECTION     X2  . ESOPHAGOGASTRODUODENOSCOPY (EGD) WITH PROPOFOL N/A 10/07/2016   Procedure: ESOPHAGOGASTRODUODENOSCOPY (EGD) WITH PROPOFOL;  Surgeon: Jonathon Bellows, MD;  Location: [facility] ENDOSCOPY;  Service: Endoscopy;  Laterality: N/A;  . HERNIA REPAIR    . LAPAROSCOPIC GASTRIC BAND REMOVAL WITH LAPAROSCOPIC GASTRIC SLEEVE RESECTION    . MITRAL VALVE REPLACEMENT    . TRICUSPID VALVE REPLACEMENT    . VENTRAL HERNIA REPAIR     Family History  Problem Relation Age of Onset  . Breast cancer Paternal Grandmother    Social History   Tobacco Use  . Smoking status: Former  Smoker    Packs/day: 0.50    Years: 25.00    Pack years: 12.50    Types: Cigarettes    Quit date: 03/08/2015    Years since quitting: 5.3  . Smokeless tobacco: Never Used  Vaping Use  . Vaping Use: Never used  Substance Use Topics  . Alcohol use: No  . Drug use: No    Pertinent Clinical Results:  LABS: Labs reviewed: Acceptable for surgery.  Hospital Outpatient Visit on 07/13/2020  Component Date Value Ref Range Status  . Potassium 07/13/2020 3.9  3.5 - 5.1 mmol/L Final   Performed at [facility], [address] [place], [place] [address]  Hospital Outpatient Visit on 07/08/2020  Component Date Value  Ref Range Status  . SARS Coronavirus 2 07/08/2020 NEGATIVE  NEGATIVE Final   Comment: (NOTE) SARS-CoV-2 target nucleic acids are NOT DETECTED.  The SARS-CoV-2 RNA is generally detectable in upper and lower respiratory specimens during the acute phase of infection. Negative results do not preclude SARS-CoV-2 infection, do not rule out co-infections with other pathogens, and should not be used as the sole basis for treatment or other patient management decisions. Negative results must be combined with clinical observations, patient history, and epidemiological information. The expected result is Negative.  Fact Sheet for Patients: SugarRoll.be  Fact Sheet for Healthcare Providers: https://www.woods-mathews.com/  This test is not yet approved or cleared by the [place] FDA and  has been authorized for detection and/or diagnosis of SARS-CoV-2 by FDA under an Emergency Use Authorization (EUA). This EUA will remain  in effect (meaning this test can be used) for the duration of the COVID-19 declaration under Se                          ction 564(b)(1) of the Act, 21 U.S.C. section 360bbb-3(b)(1), unless the authorization is terminated or revoked sooner.  Performed at [facility], [place] [address] [place], [place] [address]   Admission on 07/02/2020, Discharged on 07/03/2020  Component Date Value Ref Range Status  . Lipase 07/02/2020 262* 11 - 51 U/L Final   Performed at [facility], [address] [place], [place] [address]  . Sodium 07/02/2020 139  135 - 145 mmol/L Final  . Potassium 07/02/2020 3.2* 3.5 - 5.1 mmol/L Final   HEMOLYSIS AT THIS LEVEL MAY AFFECT RESULT  . Chloride 07/02/2020 105  98 - 111 mmol/L Final  . CO2 07/02/2020 22  22 - 32 mmol/L Final  . Glucose, Bld 07/02/2020 99  70 - 99 mg/dL Final   Glucose reference range applies only to samples taken after fasting for at least 8 hours.  . BUN 07/02/2020 8   6 - 20 mg/dL Final  . Creatinine, Ser 07/02/2020 0.71  0.44 - 1.00 mg/dL Final  . Calcium 07/02/2020 9.2  8.9 - 10.3 mg/dL Final  . Total Protein 07/02/2020 8.9* 6.5 - 8.1 g/dL Final  . Albumin 07/02/2020 4.2  3.5 - 5.0 g/dL Final  . AST 07/02/2020 46* 15 - 41 U/L Final  . ALT 07/02/2020 43  0 - 44 U/L Final  . Alkaline Phosphatase 07/02/2020 125  38 - 126 U/L Final  . Total Bilirubin 07/02/2020 5.9* 0.3 - 1.2 mg/dL Final  . GFR calc non Af Amer 07/02/2020 >60  >60 mL/min Final  . GFR calc Af Amer 07/02/2020 >60  >60 mL/min Final  . Anion gap 07/02/2020 12  5 - 15 Final  Performed at [facility], [address] [place], [place] [address]  . WBC 07/02/2020 9.2  4.0 - 10.5 K/uL Final  . RBC 07/02/2020 3.73* 3.87 - 5.11 MIL/uL Final  . Hemoglobin 07/02/2020 11.1* 12.0 - 15.0 g/dL Final  . HCT 07/02/2020 34.4* 36 - 46 % Final  . MCV 07/02/2020 92.2  80.0 - 100.0 fL Final  . MCH 07/02/2020 29.8  26.0 - 34.0 pg Final  . MCHC 07/02/2020 32.3  30.0 - 36.0 g/dL Final  . RDW 07/02/2020 17.0* 11.5 - 15.5 % Final  . Platelets 07/02/2020 189  150 - 400 K/uL Final  . nRBC 07/02/2020 0.0  0.0 - 0.2 % Final  . Neutrophils Relative % 07/02/2020 81  % Final  . Neutro Abs 07/02/2020 7.5  1.7 - 7.7 K/uL Final  . Lymphocytes Relative 07/02/2020 5  % Final  . Lymphs Abs 07/02/2020 0.4* 0.7 - 4.0 K/uL Final  . Monocytes Relative 07/02/2020 13  % Final  . Monocytes Absolute 07/02/2020 1.2* 0 - 1 K/uL Final  . Eosinophils Relative 07/02/2020 0  % Final  . Eosinophils Absolute 07/02/2020 0.0  0 - 0 K/uL Final  . Basophils Relative 07/02/2020 0  % Final  . Basophils Absolute 07/02/2020 0.0  0 - 0 K/uL Final  . Immature Granulocytes 07/02/2020 1  % Final  . Abs Immature Granulocytes 07/02/2020 0.12* 0.00 - 0.07 K/uL Final   Performed at [facility], [place] [address] [place], [place] [address]     ECG: Date: 07/02/2020 Time ECG obtained: 1854 PM Rate: 63 bpm Rhythm: SR with  marked SA. Axis (leads I and aVF): Normal Intervals: PR 138 ms. QTc 419 ms. ST segment and T wave changes: No evidence of acute ST segment elevation or depression Comparison: Similar to previous tracing obtained on 06/30/2020.   IMAGING / PROCEDURES: ECHOCARDIOGRAM done on 0810/2020 1. LVEF 55-60% 2. Normal LV systolic function with mild LVH 3. Normal RV systolic function 4. Mild PR; trivial AR, MR, TR 5. No valvular stenosis   Impression and Plan:  ZAKYIA GAGAN has been referred for pre-anesthesia review and clearance prior her undergoing the planned anesthetic and procedural courses. Available labs, pertinent testing, and imaging results were personally reviewed by me. This patient has been appropriately cleared by cardiology.   Based on clinical review performed today (07/13/20), barring any significant acute changes in the patient's overall condition, it is anticipated that she will be able to proceed with the planned surgical intervention. Any acute changes in clinical condition may necessitate her procedure being postponed and/or cancelled. Pre-surgical instructions were reviewed with the patient during her PAT appointment and questions were fielded by PAT clinical staff.  Honor Loh, MSN, APRN, FNP-C, CEN [facility]  Peri-operative Services Nurse Practitioner Phone: [phone] 07/13/20 2:18 PM  NOTE: This note has been prepared using Dragon dictation software. Despite my best ability to proofread, there is always the potential that unintentional transcriptional errors may still occur from this process.

## 2020-07-14 ENCOUNTER — Ambulatory Visit
Admission: RE | Admit: 2020-07-14 | Discharge: 2020-07-14 | Disposition: A | Discharge status: Home / Self Care | Payer: 59 | Attending: General Surgery | Admitting: General Surgery

## 2020-07-14 ENCOUNTER — Ambulatory Visit: Payer: 59 | Admitting: Urgent Care

## 2020-07-14 ENCOUNTER — Encounter
Admission: RE | Disposition: A | Discharge status: Home / Self Care | Payer: Self-pay | Source: Home / Self Care | Attending: General Surgery

## 2020-07-14 ENCOUNTER — Encounter: Payer: Self-pay | Admitting: General Surgery

## 2020-07-14 DIAGNOSIS — E782 Mixed hyperlipidemia: Secondary | ICD-10-CM | POA: Insufficient documentation

## 2020-07-14 DIAGNOSIS — I110 Hypertensive heart disease with heart failure: Secondary | ICD-10-CM | POA: Insufficient documentation

## 2020-07-14 DIAGNOSIS — K859 Acute pancreatitis without necrosis or infection, unspecified: Secondary | ICD-10-CM | POA: Insufficient documentation

## 2020-07-14 DIAGNOSIS — F329 Major depressive disorder, single episode, unspecified: Secondary | ICD-10-CM | POA: Insufficient documentation

## 2020-07-14 DIAGNOSIS — M50122 Cervical disc disorder at C5-C6 level with radiculopathy: Secondary | ICD-10-CM | POA: Diagnosis not present

## 2020-07-14 DIAGNOSIS — K7460 Unspecified cirrhosis of liver: Secondary | ICD-10-CM | POA: Insufficient documentation

## 2020-07-14 DIAGNOSIS — E119 Type 2 diabetes mellitus without complications: Secondary | ICD-10-CM | POA: Insufficient documentation

## 2020-07-14 DIAGNOSIS — K8020 Calculus of gallbladder without cholecystitis without obstruction: Secondary | ICD-10-CM | POA: Diagnosis present

## 2020-07-14 DIAGNOSIS — E559 Vitamin D deficiency, unspecified: Secondary | ICD-10-CM | POA: Diagnosis not present

## 2020-07-14 DIAGNOSIS — J90 Pleural effusion, not elsewhere classified: Secondary | ICD-10-CM | POA: Diagnosis not present

## 2020-07-14 DIAGNOSIS — M4802 Spinal stenosis, cervical region: Secondary | ICD-10-CM | POA: Insufficient documentation

## 2020-07-14 DIAGNOSIS — K2100 Gastro-esophageal reflux disease with esophagitis, without bleeding: Secondary | ICD-10-CM | POA: Insufficient documentation

## 2020-07-14 DIAGNOSIS — I428 Other cardiomyopathies: Secondary | ICD-10-CM | POA: Insufficient documentation

## 2020-07-14 DIAGNOSIS — G8929 Other chronic pain: Secondary | ICD-10-CM | POA: Diagnosis not present

## 2020-07-14 DIAGNOSIS — Z881 Allergy status to other antibiotic agents status: Secondary | ICD-10-CM | POA: Insufficient documentation

## 2020-07-14 DIAGNOSIS — Z8614 Personal history of Methicillin resistant Staphylococcus aureus infection: Secondary | ICD-10-CM | POA: Insufficient documentation

## 2020-07-14 DIAGNOSIS — Z419 Encounter for procedure for purposes other than remedying health state, unspecified: Secondary | ICD-10-CM

## 2020-07-14 DIAGNOSIS — F411 Generalized anxiety disorder: Secondary | ICD-10-CM | POA: Diagnosis not present

## 2020-07-14 DIAGNOSIS — Z7982 Long term (current) use of aspirin: Secondary | ICD-10-CM | POA: Insufficient documentation

## 2020-07-14 DIAGNOSIS — K8010 Calculus of gallbladder with chronic cholecystitis without obstruction: Secondary | ICD-10-CM | POA: Diagnosis not present

## 2020-07-14 DIAGNOSIS — I5040 Unspecified combined systolic (congestive) and diastolic (congestive) heart failure: Secondary | ICD-10-CM | POA: Insufficient documentation

## 2020-07-14 DIAGNOSIS — J45909 Unspecified asthma, uncomplicated: Secondary | ICD-10-CM | POA: Diagnosis not present

## 2020-07-14 DIAGNOSIS — Z8249 Family history of ischemic heart disease and other diseases of the circulatory system: Secondary | ICD-10-CM | POA: Insufficient documentation

## 2020-07-14 DIAGNOSIS — Z882 Allergy status to sulfonamides status: Secondary | ICD-10-CM | POA: Insufficient documentation

## 2020-07-14 DIAGNOSIS — K589 Irritable bowel syndrome without diarrhea: Secondary | ICD-10-CM | POA: Diagnosis not present

## 2020-07-14 DIAGNOSIS — Z79899 Other long term (current) drug therapy: Secondary | ICD-10-CM | POA: Insufficient documentation

## 2020-07-14 DIAGNOSIS — Z79891 Long term (current) use of opiate analgesic: Secondary | ICD-10-CM | POA: Insufficient documentation

## 2020-07-14 DIAGNOSIS — E269 Hyperaldosteronism, unspecified: Secondary | ICD-10-CM | POA: Diagnosis not present

## 2020-07-14 DIAGNOSIS — M4726 Other spondylosis with radiculopathy, lumbar region: Secondary | ICD-10-CM | POA: Insufficient documentation

## 2020-07-14 DIAGNOSIS — I2510 Atherosclerotic heart disease of native coronary artery without angina pectoris: Secondary | ICD-10-CM | POA: Insufficient documentation

## 2020-07-14 DIAGNOSIS — E669 Obesity, unspecified: Secondary | ICD-10-CM | POA: Insufficient documentation

## 2020-07-14 DIAGNOSIS — M5137 Other intervertebral disc degeneration, lumbosacral region: Secondary | ICD-10-CM | POA: Insufficient documentation

## 2020-07-14 DIAGNOSIS — I480 Paroxysmal atrial fibrillation: Secondary | ICD-10-CM | POA: Insufficient documentation

## 2020-07-14 DIAGNOSIS — M48061 Spinal stenosis, lumbar region without neurogenic claudication: Secondary | ICD-10-CM | POA: Diagnosis not present

## 2020-07-14 DIAGNOSIS — D649 Anemia, unspecified: Secondary | ICD-10-CM | POA: Diagnosis not present

## 2020-07-14 DIAGNOSIS — M1990 Unspecified osteoarthritis, unspecified site: Secondary | ICD-10-CM | POA: Diagnosis not present

## 2020-07-14 DIAGNOSIS — Z87891 Personal history of nicotine dependence: Secondary | ICD-10-CM | POA: Insufficient documentation

## 2020-07-14 DIAGNOSIS — Z91041 Radiographic dye allergy status: Secondary | ICD-10-CM | POA: Insufficient documentation

## 2020-07-14 DIAGNOSIS — Z6841 Body Mass Index (BMI) 40.0 and over, adult: Secondary | ICD-10-CM | POA: Insufficient documentation

## 2020-07-14 DIAGNOSIS — G4733 Obstructive sleep apnea (adult) (pediatric): Secondary | ICD-10-CM | POA: Diagnosis not present

## 2020-07-14 DIAGNOSIS — Z9884 Bariatric surgery status: Secondary | ICD-10-CM | POA: Insufficient documentation

## 2020-07-14 DIAGNOSIS — Z888 Allergy status to other drugs, medicaments and biological substances status: Secondary | ICD-10-CM | POA: Insufficient documentation

## 2020-07-14 DIAGNOSIS — Z7901 Long term (current) use of anticoagulants: Secondary | ICD-10-CM | POA: Insufficient documentation

## 2020-07-14 HISTORY — DX: Personal history of Methicillin resistant Staphylococcus aureus infection: Z86.14

## 2020-07-14 LAB — GLUCOSE, CAPILLARY
Glucose-Capillary: 104 mg/dL — ABNORMAL HIGH (ref 70–99)
Glucose-Capillary: 128 mg/dL — ABNORMAL HIGH (ref 70–99)

## 2020-07-14 LAB — HEPATIC FUNCTION PANEL
ALT: 21 U/L (ref 0–44)
AST: 23 U/L (ref 15–41)
Albumin: 4.1 g/dL (ref 3.5–5.0)
Alkaline Phosphatase: 108 U/L (ref 38–126)
Bilirubin, Direct: 0.6 mg/dL — ABNORMAL HIGH (ref 0.0–0.2)
Indirect Bilirubin: 1 mg/dL — ABNORMAL HIGH (ref 0.3–0.9)
Total Bilirubin: 1.6 mg/dL — ABNORMAL HIGH (ref 0.3–1.2)
Total Protein: 8.4 g/dL — ABNORMAL HIGH (ref 6.5–8.1)

## 2020-07-14 SURGERY — CHOLECYSTECTOMY, ROBOT-ASSISTED, LAPAROSCOPIC
Anesthesia: General | Site: Abdomen

## 2020-07-14 MED ORDER — PROPOFOL 10 MG/ML IV BOLUS
INTRAVENOUS | Status: AC
  Filled 2020-07-14: qty 20

## 2020-07-14 MED ORDER — DEXAMETHASONE SODIUM PHOSPHATE 10 MG/ML IJ SOLN
INTRAMUSCULAR | Status: DC | PRN
  Administered 2020-07-14: 5 mg via INTRAVENOUS

## 2020-07-14 MED ORDER — MIDAZOLAM HCL 2 MG/2ML IJ SOLN
INTRAMUSCULAR | Status: DC | PRN
  Administered 2020-07-14: 2 mg via INTRAVENOUS

## 2020-07-14 MED ORDER — HYDROMORPHONE HCL 1 MG/ML IJ SOLN
0.2500 mg | INTRAMUSCULAR | Status: DC | PRN
  Administered 2020-07-14 (×3): 0.25 mg via INTRAVENOUS

## 2020-07-14 MED ORDER — MIDAZOLAM HCL 2 MG/2ML IJ SOLN
INTRAMUSCULAR | Status: AC
  Filled 2020-07-14: qty 2

## 2020-07-14 MED ORDER — LIDOCAINE HCL (CARDIAC) PF 100 MG/5ML IV SOSY
PREFILLED_SYRINGE | INTRAVENOUS | Status: DC | PRN
  Administered 2020-07-14: 100 mg via INTRAVENOUS

## 2020-07-14 MED ORDER — BUPIVACAINE-EPINEPHRINE 0.25% -1:200000 IJ SOLN
INTRAMUSCULAR | Status: DC | PRN
  Administered 2020-07-14: 30 mL

## 2020-07-14 MED ORDER — CHLORHEXIDINE GLUCONATE 0.12 % MT SOLN
15.0000 mL | Freq: Once | OROMUCOSAL | Status: AC
  Administered 2020-07-14: 15 mL via OROMUCOSAL

## 2020-07-14 MED ORDER — FENTANYL CITRATE (PF) 100 MCG/2ML IJ SOLN
INTRAMUSCULAR | Status: AC
  Administered 2020-07-14: 25 ug via INTRAVENOUS
  Filled 2020-07-14: qty 2

## 2020-07-14 MED ORDER — KETOROLAC TROMETHAMINE 30 MG/ML IJ SOLN: 30.0000 mg | Freq: Once | INTRAMUSCULAR | Status: AC

## 2020-07-14 MED ORDER — ONDANSETRON HCL 4 MG/2ML IJ SOLN
INTRAMUSCULAR | Status: DC | PRN
  Administered 2020-07-14: 4 mg via INTRAVENOUS

## 2020-07-14 MED ORDER — GLYCOPYRROLATE 0.2 MG/ML IJ SOLN
INTRAMUSCULAR | Status: DC | PRN
  Administered 2020-07-14: .2 mg via INTRAVENOUS

## 2020-07-14 MED ORDER — BUPIVACAINE HCL (PF) 0.25 % IJ SOLN
INTRAMUSCULAR | Status: AC
  Filled 2020-07-14: qty 30

## 2020-07-14 MED ORDER — FENTANYL CITRATE (PF) 100 MCG/2ML IJ SOLN
25.0000 ug | INTRAMUSCULAR | Status: DC | PRN
  Administered 2020-07-14 (×2): 25 ug via INTRAVENOUS

## 2020-07-14 MED ORDER — HYDROCODONE-ACETAMINOPHEN 5-325 MG PO TABS
1.0000 | ORAL_TABLET | ORAL | 0 refills | Status: AC | PRN
Start: 2020-07-14 — End: 2020-07-17

## 2020-07-14 MED ORDER — EPHEDRINE SULFATE 50 MG/ML IJ SOLN
INTRAMUSCULAR | Status: DC | PRN
  Administered 2020-07-14 (×2): 5 mg via INTRAVENOUS

## 2020-07-14 MED ORDER — SODIUM CHLORIDE 0.9 % IV SOLN: INTRAVENOUS | Status: DC

## 2020-07-14 MED ORDER — ONDANSETRON HCL 4 MG/2ML IJ SOLN: 4.0000 mg | Freq: Once | INTRAMUSCULAR | Status: DC | PRN

## 2020-07-14 MED ORDER — HYDROMORPHONE HCL 1 MG/ML IJ SOLN
INTRAMUSCULAR | Status: AC
  Administered 2020-07-14: 0.25 mg via INTRAVENOUS
  Filled 2020-07-14: qty 1

## 2020-07-14 MED ORDER — ROCURONIUM BROMIDE 100 MG/10ML IV SOLN
INTRAVENOUS | Status: DC | PRN
  Administered 2020-07-14: 20 mg via INTRAVENOUS
  Administered 2020-07-14: 70 mg via INTRAVENOUS

## 2020-07-14 MED ORDER — CHLORHEXIDINE GLUCONATE 0.12 % MT SOLN
OROMUCOSAL | Status: AC
  Filled 2020-07-14: qty 15

## 2020-07-14 MED ORDER — FENTANYL CITRATE (PF) 100 MCG/2ML IJ SOLN
INTRAMUSCULAR | Status: AC
  Filled 2020-07-14: qty 2

## 2020-07-14 MED ORDER — ORAL CARE MOUTH RINSE: 15.0000 mL | Freq: Once | OROMUCOSAL | Status: AC

## 2020-07-14 MED ORDER — EPHEDRINE 5 MG/ML INJ
INTRAVENOUS | Status: AC
  Filled 2020-07-14: qty 10

## 2020-07-14 MED ORDER — EPINEPHRINE PF 1 MG/ML IJ SOLN
INTRAMUSCULAR | Status: AC
  Filled 2020-07-14: qty 1

## 2020-07-14 MED ORDER — FENTANYL CITRATE (PF) 100 MCG/2ML IJ SOLN
INTRAMUSCULAR | Status: DC | PRN
Start: 2020-07-14 — End: 2020-07-14
  Administered 2020-07-14 (×4): 50 ug via INTRAVENOUS

## 2020-07-14 MED ORDER — CEFAZOLIN SODIUM-DEXTROSE 2-4 GM/100ML-% IV SOLN
2.0000 g | INTRAVENOUS | Status: AC
  Administered 2020-07-14: 2 g via INTRAVENOUS

## 2020-07-14 MED ORDER — CEFAZOLIN SODIUM-DEXTROSE 2-4 GM/100ML-% IV SOLN
INTRAVENOUS | Status: AC
  Filled 2020-07-14: qty 100

## 2020-07-14 MED ORDER — KETOROLAC TROMETHAMINE 30 MG/ML IJ SOLN
INTRAMUSCULAR | Status: AC
  Administered 2020-07-14: 30 mg via INTRAVENOUS
  Filled 2020-07-14: qty 1

## 2020-07-14 MED ORDER — SUGAMMADEX SODIUM 200 MG/2ML IV SOLN
INTRAVENOUS | Status: DC | PRN
  Administered 2020-07-14: 200 mg via INTRAVENOUS

## 2020-07-14 MED ORDER — PROPOFOL 10 MG/ML IV BOLUS
INTRAVENOUS | Status: DC | PRN
  Administered 2020-07-14: 120 mg via INTRAVENOUS

## 2020-07-14 MED ORDER — ACETAMINOPHEN 10 MG/ML IV SOLN
INTRAVENOUS | Status: DC | PRN
  Administered 2020-07-14: 1000 mg via INTRAVENOUS

## 2020-07-14 MED ORDER — ACETAMINOPHEN 10 MG/ML IV SOLN
INTRAVENOUS | Status: AC
  Filled 2020-07-14: qty 100

## 2020-07-14 MED ORDER — LIDOCAINE HCL (PF) 2 % IJ SOLN
INTRAMUSCULAR | Status: AC
  Filled 2020-07-14: qty 5

## 2020-07-14 SURGICAL SUPPLY — 56 items
ADH SKN CLS APL DERMABOND .7 (GAUZE/BANDAGES/DRESSINGS) ×1
APL PRP STRL LF DISP 70% ISPRP (MISCELLANEOUS) ×1
BAG INFUSER PRESSURE 100CC (MISCELLANEOUS) IMPLANT
BAG SPEC RTRVL LRG 6X4 10 (ENDOMECHANICALS) ×1
BLADE SURG SZ11 CARB STEEL (BLADE) ×2 IMPLANT
CANISTER SUCT 1200ML W/VALVE (MISCELLANEOUS) ×2 IMPLANT
CANNULA REDUC XI 12-8 STAPL (CANNULA) ×1
CANNULA REDUCER 12-8 DVNC XI (CANNULA) ×1 IMPLANT
CHLORAPREP W/TINT 26 (MISCELLANEOUS) ×2 IMPLANT
CLIP VESOLOCK MED LG 6/CT (CLIP) ×4 IMPLANT
COVER WAND RF STERILE (DRAPES) ×2 IMPLANT
DECANTER SPIKE VIAL GLASS SM (MISCELLANEOUS) ×4 IMPLANT
DEFOGGER SCOPE WARMER CLEARIFY (MISCELLANEOUS) ×2 IMPLANT
DERMABOND ADVANCED (GAUZE/BANDAGES/DRESSINGS) ×1
DERMABOND ADVANCED .7 DNX12 (GAUZE/BANDAGES/DRESSINGS) ×1 IMPLANT
DRAPE ARM DVNC X/XI (DISPOSABLE) ×4 IMPLANT
DRAPE COLUMN DVNC XI (DISPOSABLE) ×1 IMPLANT
DRAPE DA VINCI XI ARM (DISPOSABLE) ×4
DRAPE DA VINCI XI COLUMN (DISPOSABLE) ×1
ELECT REM PT RETURN 9FT ADLT (ELECTROSURGICAL) ×2
ELECTRODE REM PT RTRN 9FT ADLT (ELECTROSURGICAL) ×1 IMPLANT
GLOVE BIO SURGEON STRL SZ 6.5 (GLOVE) ×4 IMPLANT
GLOVE BIOGEL PI IND STRL 6.5 (GLOVE) ×2 IMPLANT
GLOVE BIOGEL PI INDICATOR 6.5 (GLOVE) ×2
GOWN STRL REUS W/ TWL LRG LVL3 (GOWN DISPOSABLE) ×3 IMPLANT
GOWN STRL REUS W/TWL LRG LVL3 (GOWN DISPOSABLE) ×6
GRASPER SUT TROCAR 14GX15 (MISCELLANEOUS) ×4 IMPLANT
IRRIGATION STRYKERFLOW (MISCELLANEOUS) ×1 IMPLANT
IRRIGATOR STRYKERFLOW (MISCELLANEOUS) ×2
IRRIGATOR SUCT 8 DISP DVNC XI (IRRIGATION / IRRIGATOR) IMPLANT
IRRIGATOR SUCTION 8MM XI DISP (IRRIGATION / IRRIGATOR)
IV NS 1000ML (IV SOLUTION) ×2
IV NS 1000ML BAXH (IV SOLUTION) ×1 IMPLANT
KIT PINK PAD W/HEAD ARE REST (MISCELLANEOUS) ×2
KIT PINK PAD W/HEAD ARM REST (MISCELLANEOUS) ×1 IMPLANT
LABEL OR SOLS (LABEL) ×2 IMPLANT
NEEDLE HYPO 22GX1.5 SAFETY (NEEDLE) ×2 IMPLANT
NEEDLE INSUFFLATION 14GA 120MM (NEEDLE) ×2 IMPLANT
NS IRRIG 500ML POUR BTL (IV SOLUTION) ×2 IMPLANT
OBTURATOR OPTICAL STANDARD 8MM (TROCAR) ×1
OBTURATOR OPTICAL STND 8 DVNC (TROCAR) ×1
OBTURATOR OPTICALSTD 8 DVNC (TROCAR) ×1 IMPLANT
PACK LAP CHOLECYSTECTOMY (MISCELLANEOUS) ×2 IMPLANT
POUCH SPECIMEN RETRIEVAL 10MM (ENDOMECHANICALS) ×2 IMPLANT
SEAL CANN UNIV 5-8 DVNC XI (MISCELLANEOUS) ×3 IMPLANT
SEAL XI 5MM-8MM UNIVERSAL (MISCELLANEOUS) ×3
SET TUBE SMOKE EVAC HIGH FLOW (TUBING) ×2 IMPLANT
SOLUTION ELECTROLUBE (MISCELLANEOUS) ×2 IMPLANT
STAPLER CANNULA SEAL DVNC XI (STAPLE) ×1 IMPLANT
STAPLER CANNULA SEAL XI (STAPLE) ×1
SUT MNCRL 4-0 (SUTURE) ×2
SUT MNCRL 4-0 27XMFL (SUTURE) ×1
SUT VIC AB 3-0 SH 27 (SUTURE)
SUT VIC AB 3-0 SH 27X BRD (SUTURE) IMPLANT
SUT VICRYL 0 AB UR-6 (SUTURE) IMPLANT
SUTURE MNCRL 4-0 27XMF (SUTURE) ×1 IMPLANT

## 2020-07-14 NOTE — Discharge Instructions (Signed)
AMBULATORY SURGERY  DISCHARGE INSTRUCTIONS   1) The drugs that you were given will stay in your system until tomorrow so for the next 24 hours you should not:  A) Drive an automobile B) Make any legal decisions C) Drink any alcoholic beverage   2) You may resume regular meals tomorrow.  Today it is better to start with liquids and gradually work up to solid foods.  You may eat anything you prefer, but it is better to start with liquids, then soup and crackers, and gradually work up to solid foods.   3) Please notify your doctor immediately if you have any unusual bleeding, trouble breathing, redness and pain at the surgery site, drainage, fever, or pain not relieved by medication.    4) Additional Instructions:        Please contact your physician with any problems or Same Day Surgery at [phone], Monday through Friday 6 am to 4 pm, or [place] at [facility] number at [phone]. Diet: Resume home heart healthy regular diet.   Activity: No heavy lifting >20 pounds (children, pets, laundry, garbage) or strenuous activity until follow-up, but light activity and walking are encouraged. Do not drive or drink alcohol if taking narcotic pain medications.  Wound care: May shower with soapy water and pat dry (do not rub incisions), but no baths or submerging incision underwater until follow-up. (no swimming)   Medications: Resume all home medications. For mild to moderate pain: acetaminophen (Tylenol) or ibuprofen (if no kidney disease). Combining Tylenol with alcohol can substantially increase your risk of causing liver disease. Narcotic pain medications, if prescribed, can be used for severe pain, though may cause nausea, constipation, and drowsiness. Do not combine Tylenol and Norco within a 6 hour period as Norco contains Tylenol. If you do not need the narcotic pain medication, you do not need to fill the prescription.  Call office [phone]) at any time if any  questions, worsening pain, fevers/chills, bleeding, drainage from incision site, or other concerns.   AMBULATORY SURGERY  DISCHARGE INSTRUCTIONS   5) The drugs that you were given will stay in your system until tomorrow so for the next 24 hours you should not:  D) Drive an automobile E) Make any legal decisions F) Drink any alcoholic beverage   6) You may resume regular meals tomorrow.  Today it is better to start with liquids and gradually work up to solid foods.  You may eat anything you prefer, but it is better to start with liquids, then soup and crackers, and gradually work up to solid foods.   7) Please notify your doctor immediately if you have any unusual bleeding, trouble breathing, redness and pain at the surgery site, drainage, fever, or pain not relieved by medication.   8) Additional Instructions:   Please contact your physician with any problems or Same Day Surgery at [phone], Monday through Friday 6 am to 4 pm, or [place] at [facility] number at [phone].

## 2020-07-14 NOTE — Anesthesia Preprocedure Evaluation (Signed)
Anesthesia Evaluation  Patient identified by MRN, date of birth, ID band Patient awake    Reviewed: Allergy & Precautions, H&P , NPO status , Patient's Chart, lab work & pertinent test results, reviewed documented beta blocker date and time   Airway Mallampati: III  TM Distance: >3 FB Neck ROM: full    Dental  (+) Teeth Intact   Pulmonary shortness of breath and with exertion, asthma , sleep apnea , pneumonia, unresolved, former smoker,    Pulmonary exam normal        Cardiovascular Exercise Tolerance: Poor hypertension, On Medications + CAD and +CHF  Normal cardiovascular exam+ dysrhythmias  Rhythm:regular Rate:Normal     Neuro/Psych  Headaches, PSYCHIATRIC DISORDERS Anxiety Depression  Neuromuscular disease    GI/Hepatic Neg liver ROS, hiatal hernia, GERD  Medicated,  Endo/Other  negative endocrine ROSdiabetes  Renal/GU negative Renal ROS  negative genitourinary   Musculoskeletal   Abdominal   Peds  Hematology  (+) Blood dyscrasia, anemia ,   Anesthesia Other Findings Past Medical History: No date: Acid reflux No date: Anemia No date: Anxiety No date: Arthritis     Comment:  OSTEO No date: Asthma No date: CHF (congestive heart failure) (HCC) No date: Chronic anticoagulation No date: Chronic pain No date: Cirrhosis (HCC)     Comment:  NASH No date: Coronary artery disease No date: Depression No date: Diabetes mellitus without complication (HCC) No date: Dyspnea No date: Dysrhythmia     Comment:  AFTER COVID VACCINE ARRYTHMIAS-SEEING [place]  No date: Esophagitis No date: Headache     Comment:  MIGRAINES No date: Heart valve replaced No date: Helicobacter pylori infection No date: High cholesterol No date: High cholesterol No date: History of hiatal hernia No date: History of methicillin resistant staphylococcus aureus (MRSA) No date: Hyperaldosteronism (HCC) No date: Hypertension No date:  Hypokalemia No date: IBS (irritable bowel syndrome) No date: IBS (irritable bowel syndrome) No date: Sciatic pain No date: Sleep apnea     Comment:  USES CPAP Past Surgical History: No date: ABDOMINAL HYSTERECTOMY No date: AORTIC VALVE REPLACEMENT 07/25/2017: BREAST BIOPSY; Left     Comment:  BENIGN BREAST TISSUE WITH MICROCALCIFICATIONS 10/22/2019: BREAST BIOPSY; Right     Comment:  Affirm Bx- X-clip- CSL, no atypia 10/22/2019: BREAST BIOPSY; Right     Comment:  Affirm Bx- Ribbon clip- CSL, no atypia 11/20/2019: BREAST BIOPSY; Right     Comment:  Procedure: BREAST BIOPSY WITH NEEDLE LOCALIZATION x 2;                Surgeon: Herbert Pun, MD;  Location: [facility] ORS;               Service: General;  Laterality: Right; 1992: BREAST EXCISIONAL BIOPSY; Right     Comment:  NEG 11/20/2019: BREAST LUMPECTOMY; Right     Comment:  complex sclerosing lesion x 2 No date: CARDIAC VALVE SURGERY No date: CESAREAN SECTION     Comment:  X2 10/07/2016: ESOPHAGOGASTRODUODENOSCOPY (EGD) WITH PROPOFOL; N/A     Comment:  Procedure: ESOPHAGOGASTRODUODENOSCOPY (EGD) WITH               PROPOFOL;  Surgeon: Jonathon Bellows, MD;  Location: [facility]               ENDOSCOPY;  Service: Endoscopy;  Laterality: N/A; No date: HERNIA REPAIR No date: LAPAROSCOPIC GASTRIC BAND REMOVAL WITH LAPAROSCOPIC GASTRIC  SLEEVE RESECTION No date: MITRAL VALVE REPLACEMENT No date: TRICUSPID VALVE REPLACEMENT No date: VENTRAL HERNIA REPAIR BMI  Body Mass Index: 40.24 kg/m     Reproductive/Obstetrics negative OB ROS                             Anesthesia Physical Anesthesia Plan  ASA: III  Anesthesia Plan: General ETT   Post-op Pain Management:    Induction:   PONV Risk Score and Plan:   Airway Management Planned:   Additional Equipment:   Intra-op Plan:   Post-operative Plan:   Informed Consent: I have reviewed the patients History and Physical, chart, labs and discussed the  procedure including the risks, benefits and alternatives for the proposed anesthesia with the patient or authorized representative who has indicated his/her understanding and acceptance.     Dental Advisory Given  Plan Discussed with: CRNA  Anesthesia Plan Comments:         Anesthesia Quick Evaluation

## 2020-07-14 NOTE — Anesthesia Procedure Notes (Signed)
Procedure Name: Intubation Date/Time: 07/14/2020 11:39 AM Performed by: Jerrye Noble, CRNA Pre-anesthesia Checklist: Patient identified, Emergency Drugs available, Suction available and Patient being monitored Patient Re-evaluated:Patient Re-evaluated prior to induction Oxygen Delivery Method: Circle system utilized Preoxygenation: Pre-oxygenation with 100% oxygen Induction Type: IV induction Ventilation: Mask ventilation without difficulty and Oral airway inserted - appropriate to patient size Laryngoscope Size: McGraph and 3 Grade View: Grade I Tube type: Oral Tube size: 7.0 mm Number of attempts: 1 Airway Equipment and Method: Stylet and Video-laryngoscopy Placement Confirmation: ETT inserted through vocal cords under direct vision,  positive ETCO2 and breath sounds checked- equal and bilateral Secured at: 22 cm Tube secured with: Tape Dental Injury: Teeth and Oropharynx as per pre-operative assessment

## 2020-07-14 NOTE — Op Note (Signed)
Preoperative diagnosis: Cholelithiasis  Postoperative diagnosis: Cholelithiasis  Procedure: Robotic Assisted Laparoscopic Cholecystectomy.   Anesthesia: GETA   Surgeon: Dr. Windell Moment  Wound Classification: Clean Contaminated  Indications: Patient is a 49 y.o. female developed right upper quadrant pain, nausea, vomiting and on workup was found to have cholelithiasis with a normal common duct and biliary pancreatitis. MRCP negative for choledocholithiasis, Bilirubin trending down.  Robotic Assisted Laparoscopic cholecystectomy was elected.  Findings: Fatty liver Critical view of safety achieved Cystic duct and artery identified, ligated and divided Adequate hemostasis  Description of procedure: The patient was placed on the operating table in the supine position. General anesthesia was induced. A time-out was completed verifying correct patient, procedure, site, positioning, and implant(s) and/or special equipment prior to beginning this procedure. An orogastric tube was placed. The abdomen was prepped and draped in the usual sterile fashion.  An incision was made in a natural skin line below the umbilicus.  The fascia was elevated and the Veress needle inserted. Proper position was confirmed by aspiration and saline meniscus test.  The abdomen was insufflated with carbon dioxide to a pressure of 15 mmHg. The patient tolerated insufflation well. A 8-mm trocar was then inserted in optiview fashion.  The laparoscope was inserted and the abdomen inspected. No injuries from initial trocar placement were noted. Additional trocars were then inserted in the following locations: an 8-mm trocar in the left lateral abdomen, and another two 8-mm trocars to the right side of the abdomen 5 cm appart. The umbilical trocar was changed to a 12 mm trocar all under direct visualization. The abdomen was inspected and no abnormalities were found. The table was placed in the reverse Trendelenburg position with  the right side up. The robotic arms were docked and target anatomy identified. Instrument inserted under direct visualization.  Filmy adhesions between the gallbladder and omentum, duodenum and transverse colon were lysed with electrocautery. The dome of the gallbladder was grasped with a prograsp and retracted over the dome of the liver. The infundibulum was also grasped with an atraumatic grasper and retracted toward the right lower quadrant. This maneuver exposed Calot's triangle. The peritoneum overlying the gallbladder infundibulum was then incised and the cystic duct and cystic artery identified and circumferentially dissected. Critical view of safety reviewed before ligating any structure. Firefly images taken to visualize biliary ducts. The cystic duct and cystic artery were then doubly clipped and divided close to the gallbladder.  The gallbladder was then dissected from its peritoneal attachments by electrocautery. Hemostasis was checked and the gallbladder and contained stones were removed using an endoscopic retrieval bag. The gallbladder was passed off the table as a specimen. The gallbladder fossa was copiously irrigated with saline and hemostasis was obtained. There was no evidence of bleeding from the gallbladder fossa or cystic artery or leakage of the bile from the cystic duct stump. Secondary trocars were removed under direct vision. No bleeding was noted. The robotic arms were undoked. The scope was withdrawn and the umbilical trocar removed. The abdomen was allowed to collapse. The fascia of the 7m trocar sites was closed with figure-of-eight 0 vicryl sutures. The skin was closed with subcuticular sutures of 4-0 monocryl and topical skin adhesive. The orogastric tube was removed.  The patient tolerated the procedure well and was taken to the postanesthesia care unit in stable condition.   Specimen: Gallbladder  Complications: None  EBL: 10 mL

## 2020-07-14 NOTE — Interval H&P Note (Signed)
History and Physical Interval Note:  07/14/2020 10:49 AM  Shelly Huynh  has presented today for surgery, with the diagnosis of K80.20 Cholelithiasis w/o cholecystitis.  The various methods of treatment have been discussed with the patient and family. After consideration of risks, benefits and other options for treatment, the patient has consented to  Procedure(s): XI ROBOTIC ASSISTED LAPAROSCOPIC CHOLECYSTECTOMY W/ cholangiogram (N/A) as a surgical intervention.  The patient's history has been reviewed, patient examined, no change in status, stable for surgery.  I have reviewed the patient's chart and labs. New labs today shows trending down of the bilirubin with mostly indirect bilirubin. I might need to do a cholangiogram. Questions were answered to the patient's satisfaction.     Herbert Pun

## 2020-07-14 NOTE — Transfer of Care (Signed)
Immediate Anesthesia Transfer of Care Note  Patient: Shelly Huynh  Procedure(s) Performed: XI ROBOTIC ASSISTED LAPAROSCOPIC CHOLECYSTECTOMY (N/A Abdomen)  Patient Location: PACU  Anesthesia Type:General  Level of Consciousness: awake and drowsy  Airway & Oxygen Therapy: Patient Spontanous Breathing and Patient connected to face mask oxygen  Post-op Assessment: Report given to RN and Post -op Vital signs reviewed and stable  Post vital signs: Reviewed and stable  Last Vitals:  Vitals Value Taken Time  BP 142/87 07/14/20 1312  Temp 36.3 C 07/14/20 1312  Pulse 46 07/14/20 1316  Resp 15 07/14/20 1316  SpO2 100 % 07/14/20 1316  Vitals shown include unvalidated device data.  Last Pain:  Vitals:   07/14/20 0933  TempSrc: Temporal  PainSc: 0-No pain         Complications: No complications documented.

## 2020-07-15 LAB — SURGICAL PATHOLOGY

## 2020-07-16 NOTE — Anesthesia Postprocedure Evaluation (Signed)
Anesthesia Post Note  Patient: Shelly Huynh  Procedure(s) Performed: XI ROBOTIC ASSISTED LAPAROSCOPIC CHOLECYSTECTOMY (N/A Abdomen)  Patient location during evaluation: PACU Anesthesia Type: General Level of consciousness: awake and alert Pain management: pain level controlled Vital Signs Assessment: post-procedure vital signs reviewed and stable Respiratory status: spontaneous breathing, nonlabored ventilation, respiratory function stable and patient connected to nasal cannula oxygen Cardiovascular status: blood pressure returned to baseline and stable Postop Assessment: no apparent nausea or vomiting Anesthetic complications: no   No complications documented.   Last Vitals:  Vitals:   07/14/20 1528 07/14/20 1552  BP: 133/78 138/72  Pulse: (!) 56 60  Resp: 16 16  Temp: 36.4 C (!) 36.2 C  SpO2: 95% 97%    Last Pain:  Vitals:   07/15/20 0835  TempSrc:   PainSc: [name]

## 2020-07-19 ENCOUNTER — Other Ambulatory Visit: Payer: Self-pay | Admitting: Dermatology

## 2020-07-22 ENCOUNTER — Ambulatory Visit: Payer: 59

## 2020-07-22 HISTORY — PX: CHOLECYSTECTOMY: SHX55

## 2020-08-05 ENCOUNTER — Other Ambulatory Visit: Payer: Self-pay

## 2020-08-05 ENCOUNTER — Ambulatory Visit
Admission: RE | Admit: 2020-08-05 | Discharge: 2020-08-05 | Disposition: A | Discharge status: Home / Self Care | Payer: 59 | Source: Ambulatory Visit | Attending: Physical Medicine and Rehabilitation | Admitting: Physical Medicine and Rehabilitation

## 2020-08-05 DIAGNOSIS — M5441 Lumbago with sciatica, right side: Secondary | ICD-10-CM | POA: Insufficient documentation

## 2020-08-05 DIAGNOSIS — M5442 Lumbago with sciatica, left side: Secondary | ICD-10-CM | POA: Insufficient documentation

## 2020-08-05 DIAGNOSIS — M5416 Radiculopathy, lumbar region: Secondary | ICD-10-CM | POA: Diagnosis not present

## 2020-08-05 DIAGNOSIS — M48062 Spinal stenosis, lumbar region with neurogenic claudication: Secondary | ICD-10-CM | POA: Insufficient documentation

## 2020-09-14 ENCOUNTER — Other Ambulatory Visit: Payer: Self-pay | Admitting: General Surgery

## 2020-09-14 DIAGNOSIS — N6489 Other specified disorders of breast: Secondary | ICD-10-CM

## 2020-09-17 ENCOUNTER — Other Ambulatory Visit
Admission: RE | Admit: 2020-09-17 | Discharge: 2020-09-17 | Disposition: A | Discharge status: Home / Self Care | Payer: 59 | Source: Ambulatory Visit | Attending: Internal Medicine | Admitting: Internal Medicine

## 2020-09-17 ENCOUNTER — Other Ambulatory Visit: Payer: Self-pay

## 2020-09-17 DIAGNOSIS — Z20822 Contact with and (suspected) exposure to covid-19: Secondary | ICD-10-CM | POA: Insufficient documentation

## 2020-09-17 DIAGNOSIS — Z01812 Encounter for preprocedural laboratory examination: Secondary | ICD-10-CM | POA: Insufficient documentation

## 2020-09-18 LAB — SARS CORONAVIRUS 2 (TAT 6-24 HRS): SARS Coronavirus 2: NEGATIVE

## 2020-09-21 ENCOUNTER — Ambulatory Visit
Admission: RE | Admit: 2020-09-21 | Discharge: 2020-09-21 | Disposition: A | Discharge status: Home / Self Care | Payer: 59 | Attending: Internal Medicine | Admitting: Internal Medicine

## 2020-09-21 ENCOUNTER — Encounter
Admission: RE | Disposition: A | Discharge status: Home / Self Care | Payer: Self-pay | Source: Home / Self Care | Attending: Internal Medicine

## 2020-09-21 ENCOUNTER — Ambulatory Visit: Payer: 59 | Admitting: Certified Registered"

## 2020-09-21 DIAGNOSIS — K31819 Angiodysplasia of stomach and duodenum without bleeding: Secondary | ICD-10-CM | POA: Diagnosis not present

## 2020-09-21 DIAGNOSIS — Z91041 Radiographic dye allergy status: Secondary | ICD-10-CM | POA: Insufficient documentation

## 2020-09-21 DIAGNOSIS — Z79899 Other long term (current) drug therapy: Secondary | ICD-10-CM | POA: Diagnosis not present

## 2020-09-21 DIAGNOSIS — K640 First degree hemorrhoids: Secondary | ICD-10-CM | POA: Diagnosis not present

## 2020-09-21 DIAGNOSIS — Z7901 Long term (current) use of anticoagulants: Secondary | ICD-10-CM | POA: Diagnosis not present

## 2020-09-21 DIAGNOSIS — Z1211 Encounter for screening for malignant neoplasm of colon: Secondary | ICD-10-CM | POA: Insufficient documentation

## 2020-09-21 DIAGNOSIS — K7469 Other cirrhosis of liver: Secondary | ICD-10-CM | POA: Diagnosis not present

## 2020-09-21 DIAGNOSIS — D175 Benign lipomatous neoplasm of intra-abdominal organs: Secondary | ICD-10-CM | POA: Insufficient documentation

## 2020-09-21 DIAGNOSIS — I509 Heart failure, unspecified: Secondary | ICD-10-CM | POA: Insufficient documentation

## 2020-09-21 DIAGNOSIS — J45909 Unspecified asthma, uncomplicated: Secondary | ICD-10-CM | POA: Insufficient documentation

## 2020-09-21 DIAGNOSIS — Z9884 Bariatric surgery status: Secondary | ICD-10-CM | POA: Diagnosis not present

## 2020-09-21 DIAGNOSIS — Z881 Allergy status to other antibiotic agents status: Secondary | ICD-10-CM | POA: Diagnosis not present

## 2020-09-21 DIAGNOSIS — K766 Portal hypertension: Secondary | ICD-10-CM | POA: Diagnosis not present

## 2020-09-21 DIAGNOSIS — Z91018 Allergy to other foods: Secondary | ICD-10-CM | POA: Insufficient documentation

## 2020-09-21 DIAGNOSIS — I2510 Atherosclerotic heart disease of native coronary artery without angina pectoris: Secondary | ICD-10-CM | POA: Insufficient documentation

## 2020-09-21 DIAGNOSIS — Z888 Allergy status to other drugs, medicaments and biological substances status: Secondary | ICD-10-CM | POA: Diagnosis not present

## 2020-09-21 DIAGNOSIS — Z7982 Long term (current) use of aspirin: Secondary | ICD-10-CM | POA: Diagnosis not present

## 2020-09-21 DIAGNOSIS — E119 Type 2 diabetes mellitus without complications: Secondary | ICD-10-CM | POA: Insufficient documentation

## 2020-09-21 DIAGNOSIS — K2100 Gastro-esophageal reflux disease with esophagitis, without bleeding: Secondary | ICD-10-CM | POA: Diagnosis not present

## 2020-09-21 DIAGNOSIS — G4730 Sleep apnea, unspecified: Secondary | ICD-10-CM | POA: Diagnosis not present

## 2020-09-21 DIAGNOSIS — I110 Hypertensive heart disease with heart failure: Secondary | ICD-10-CM | POA: Diagnosis not present

## 2020-09-21 DIAGNOSIS — K2970 Gastritis, unspecified, without bleeding: Secondary | ICD-10-CM | POA: Insufficient documentation

## 2020-09-21 DIAGNOSIS — D124 Benign neoplasm of descending colon: Secondary | ICD-10-CM | POA: Insufficient documentation

## 2020-09-21 DIAGNOSIS — K589 Irritable bowel syndrome without diarrhea: Secondary | ICD-10-CM | POA: Diagnosis not present

## 2020-09-21 DIAGNOSIS — Z7951 Long term (current) use of inhaled steroids: Secondary | ICD-10-CM | POA: Diagnosis not present

## 2020-09-21 DIAGNOSIS — Z882 Allergy status to sulfonamides status: Secondary | ICD-10-CM | POA: Diagnosis not present

## 2020-09-21 HISTORY — PX: ESOPHAGOGASTRODUODENOSCOPY (EGD) WITH PROPOFOL: SHX5813

## 2020-09-21 HISTORY — PX: COLONOSCOPY WITH PROPOFOL: SHX5780

## 2020-09-21 LAB — GLUCOSE, CAPILLARY: Glucose-Capillary: 89 mg/dL (ref 70–99)

## 2020-09-21 SURGERY — ESOPHAGOGASTRODUODENOSCOPY (EGD) WITH PROPOFOL
Anesthesia: General

## 2020-09-21 MED ORDER — SODIUM CHLORIDE 0.9 % IV SOLN
INTRAVENOUS | Status: DC
  Administered 2020-09-21: 20 mL/h via INTRAVENOUS

## 2020-09-21 MED ORDER — GLYCOPYRROLATE 0.2 MG/ML IJ SOLN
INTRAMUSCULAR | Status: DC | PRN
  Administered 2020-09-21 (×2): .2 mg via INTRAVENOUS

## 2020-09-21 MED ORDER — PROPOFOL 500 MG/50ML IV EMUL
INTRAVENOUS | Status: AC
  Filled 2020-09-21: qty 50

## 2020-09-21 MED ORDER — PROPOFOL 500 MG/50ML IV EMUL
INTRAVENOUS | Status: DC | PRN
  Administered 2020-09-21: 145 ug/kg/min via INTRAVENOUS

## 2020-09-21 MED ORDER — GLYCOPYRROLATE 0.2 MG/ML IJ SOLN
INTRAMUSCULAR | Status: AC
  Filled 2020-09-21: qty 5

## 2020-09-21 MED ORDER — PHENYLEPHRINE HCL (PRESSORS) 10 MG/ML IV SOLN
INTRAVENOUS | Status: DC | PRN
  Administered 2020-09-21: 100 ug via INTRAVENOUS

## 2020-09-21 MED ORDER — LIDOCAINE HCL (CARDIAC) PF 100 MG/5ML IV SOSY
PREFILLED_SYRINGE | INTRAVENOUS | Status: DC | PRN
  Administered 2020-09-21: 100 mg via INTRAVENOUS

## 2020-09-21 MED ORDER — PROPOFOL 10 MG/ML IV BOLUS
INTRAVENOUS | Status: DC | PRN
  Administered 2020-09-21 (×2): 10 mg via INTRAVENOUS
  Administered 2020-09-21: 50 mg via INTRAVENOUS

## 2020-09-21 NOTE — H&P (Signed)
Outpatient short stay form Pre-procedure 09/21/2020 10:20 AM Shelly Huynh K. Alice Reichert, M.D.  Primary Physician: Tracie Harrier, M.D.  Reason for visit:  Epigastric, LUQ pain, RUQ pain, colon cancer screening, portal venous hypertension.  History of present illness: 49 y/o female with hx of cryptogenic cirrhosis has epigastric, LUQ and RUQ pain, minimally improved with PPI therapy. Denies dysphagia, hemetemesis, melena, hematochezia. Patient is s/p sleeve gastrectomy. She was on lovenox bridge and has held her coumadin for > 5 days.    Current Facility-Administered Medications:  .  0.9 %  sodium chloride infusion, , Intravenous, Continuous, [place], Benay Pike, MD, Last Rate: 20 mL/hr at 09/21/20 1011, 20 mL/hr at 09/21/20 1011  Medications Prior to Admission  Medication Sig Dispense Refill Last Dose  . albuterol (VENTOLIN HFA) 108 (90 Base) MCG/ACT inhaler Inhale 2 puffs into the lungs every 6 (six) hours as needed for wheezing or shortness of breath.   09/20/2020 at Unknown time  . aspirin EC 81 MG tablet Take 81 mg daily by mouth.   09/20/2020 at Unknown time  . candesartan (ATACAND) 8 MG tablet Take 8 mg by mouth at bedtime.    09/21/2020 at 0700  . carvedilol (COREG) 6.25 MG tablet Take 6.25 mg by mouth 2 (two) times daily.   09/21/2020 at 0700  . Cholecalciferol 25 MCG (1000 UT) tablet Take 1,000 Units by mouth daily.    Past Week at Unknown time  . cyclobenzaprine (FLEXERIL) 10 MG tablet Take 10 mg by mouth 3 (three) times daily as needed for muscle spasms.    Past Week at Unknown time  . diazepam (VALIUM) 5 MG tablet Take 5 mg by mouth every 8 (eight) hours as needed for anxiety or muscle spasms.    Past Week at Unknown time  . docusate sodium (COLACE) 100 MG capsule Take 100 mg by mouth daily as needed for mild constipation.    Past Week at Unknown time  . DULoxetine (CYMBALTA) 30 MG capsule Take 30 mg by mouth at bedtime.   Past Week at Unknown time  . enoxaparin (LOVENOX) 120 MG/0.8ML  injection Inject 120 mg into the skin in the morning and at bedtime. PT WAS INSTRUCTED TO STOP HER ASPIRIN AND COUMADIN AND START LOVENOX BRIDGE IN PREPARATION FOR HER COLONOSCOPY ON 8-20 AND HER CHOLECYSTECTOMY ON 8-24   09/20/2020 at Unknown time  . Flaxseed, Linseed, (FLAX SEED OIL PO) Take 1 tablet by mouth daily.   Past Week at Unknown time  . fluticasone (FLONASE) 50 MCG/ACT nasal spray Place 2 sprays into both nostrils daily as needed for allergies or rhinitis.   Past Week at Unknown time  . Galcanezumab-gnlm (EMGALITY [place]) Inject into the skin.   Past Week at Unknown time  . Galcanezumab-gnlm 120 MG/ML SOSY Inject 120 mg into the skin every 30 (thirty) days.    Past Week at Unknown time  . hydrocortisone 2.5 % cream Apply 1 application topically 2 (two) times a week.   Past Week at Unknown time  . iron polysaccharides (FERREX 150) 150 MG capsule Take 150 mg by mouth daily.   Past Week at Unknown time  . levalbuterol (XOPENEX) 1.25 MG/0.5ML nebulizer solution Take 1.25 mg by nebulization every 4 (four) hours as needed for wheezing or shortness of breath.   Past Week at Unknown time  . levocetirizine (XYZAL) 5 MG tablet Take 5 mg by mouth every evening.    Past Week at Unknown time  . linaclotide (LINZESS) 72 MCG capsule Take 72 mcg  by mouth as needed.    Past Week at Unknown time  . meclizine (ANTIVERT) 25 MG tablet Take 25 mg by mouth 3 (three) times daily as needed for dizziness.    Past Week at Unknown time  . memantine (NAMENDA) 5 MG tablet Take 5 mg by mouth 2 (two) times daily.    Past Week at Unknown time  . Minoxidil 5 % FOAM Apply 1 application topically daily.    Past Week at Unknown time  . ondansetron (ZOFRAN) 4 MG tablet Take 1 tablet (4 mg total) by mouth daily as needed for nausea or vomiting. 20 tablet 0 Past Week at Unknown time  . oxyCODONE (ROXICODONE) 5 MG immediate release tablet Take 1 tablet (5 mg total) by mouth every 8 (eight) hours as needed. 20 tablet 0 Past Week at  Unknown time  . pantoprazole (PROTONIX) 40 MG tablet Take 40 mg by mouth 2 (two) times daily.    09/20/2020 at Unknown time  . pravastatin (PRAVACHOL) 20 MG tablet Take 20 mg by mouth at bedtime.    09/20/2020 at Unknown time  . spironolactone (ALDACTONE) 25 MG tablet Take 25 mg by mouth daily.    Past Week at Unknown time  . spironolactone (ALDACTONE) 50 MG tablet Take 1 tablet (50 mg total) by mouth once daily Take with 25 mg tablet = 75 mg daily   Past Week at Unknown time  . warfarin (COUMADIN) 4 MG tablet Take 4 mg by mouth daily.    Past Week at Unknown time  . warfarin (COUMADIN) 7.5 MG tablet Take 1 tablet (7.5 mg total) one time only at 6 PM by mouth. 30 tablet 0 Past Week at Unknown time  . gabapentin (NEURONTIN) 300 MG capsule Take 1 capsule (300 mg total) by mouth at bedtime. 90 capsule 0      Allergies  Allergen Reactions  . Ivp Dye [Iodinated Diagnostic Agents] Anaphylaxis and Hives    Especially CT contrast media; tightness in chest  . Sulfa Antibiotics Hives and Anaphylaxis  . Banana Itching  . Guaifenesin Hives  . Hctz [Hydrochlorothiazide] Hives  . Keflex [Cephalexin]   . Losartan Itching  . Povidone Iodine Hives  . Lisinopril Hives and Rash  . Mucinex [Guaifenesin Er] Hives and Rash     Past Medical History:  Diagnosis Date  . Acid reflux   . Anemia   . Anxiety   . Arthritis    OSTEO  . Asthma   . CHF (congestive heart failure) ([place])   . Chronic anticoagulation   . Chronic pain   . Cirrhosis ([place])    NASH  . Coronary artery disease   . Depression   . Diabetes mellitus without complication ([place])   . Dyspnea   . Dysrhythmia    AFTER COVID VACCINE ARRYTHMIAS-SEEING [place]   . Esophagitis   . Headache    MIGRAINES  . Heart valve replaced   . Helicobacter pylori infection   . High cholesterol   . High cholesterol   . History of hiatal hernia   . History of methicillin resistant staphylococcus aureus (MRSA)   . Hyperaldosteronism ([place])   .  Hypertension   . Hypokalemia   . IBS (irritable bowel syndrome)   . IBS (irritable bowel syndrome)   . Sciatic pain   . Sleep apnea    USES CPAP    Review of systems:  Otherwise negative.    Physical Exam  Gen: Alert, oriented. Appears stated age.  HEENT: [place]/AT.  PERRLA. Lungs: CTA, no wheezes. CV: RR nl S1, S2. Abd: soft, benign, no masses. BS+ Ext: No edema. Pulses 2+    Planned procedures: Proceed with EGD and colonoscopy. The patient understands the nature of the planned procedure, indications, risks, alternatives and potential complications including but not limited to bleeding, infection, perforation, damage to internal organs and possible oversedation/side effects from anesthesia. The patient agrees and gives consent to proceed.  Please refer to procedure notes for findings, recommendations and patient disposition/instructions.     Linh Hedberg K. Alice Reichert, M.D. Gastroenterology 09/21/2020  10:20 AM

## 2020-09-21 NOTE — Op Note (Signed)
[facility] Gastroenterology Patient Name: Shelly Huynh Procedure Date: 09/21/2020 10:27 AM MRN: [id number] Account #: [id number] Date of Birth: 1971-05-08 Admit Type: Outpatient Age: 49 Room: [facility] ENDO ROOM 2 Gender: Female Note Status: Finalized Procedure:             Upper GI endoscopy Indications:           Epigastric abdominal pain, Abdominal pain in the right                         upper quadrant, Abdominal pain in the left upper                         quadrant, Gastro-esophageal reflux disease Providers:             Benay Pike. Alice Reichert MD, MD Referring MD:          Tracie Harrier, MD (Referring MD) Medicines:             Propofol per Anesthesia Complications:         No immediate complications. Procedure:             Pre-Anesthesia Assessment:                        - The risks and benefits of the procedure and the                         sedation options and risks were discussed with the                         patient. All questions were answered and informed                         consent was obtained.                        - Patient identification and proposed procedure were                         verified prior to the procedure by the nurse. The                         procedure was verified in the procedure room.                        - ASA Grade Assessment: III - A patient with severe                         systemic disease.                        - After reviewing the risks and benefits, the patient                         was deemed in satisfactory condition to undergo the                         procedure.                        After obtaining  informed consent, the endoscope was                         passed under direct vision. Throughout the procedure,                         the patient's blood pressure, pulse, and oxygen                         saturations were monitored continuously. The Endoscope                         was  introduced through the mouth, and advanced to the                         third part of duodenum. The upper GI endoscopy was                         accomplished without difficulty. The patient tolerated                         the procedure well. Findings:      The esophagus was normal.      Patchy mild inflammation characterized by congestion (edema) and       erythema was found in the gastric body and in the gastric antrum.       Biopsies were taken with a cold forceps for Helicobacter pylori testing.       Verification of patient identification for the specimen was done by the       nurse.      The cardia and gastric fundus were normal on retroflexion.      The examined duodenum was normal.      A single 4 mm angioectasia with no bleeding was found in the cardia.      The exam was otherwise without abnormality. Impression:            - Normal esophagus.                        - Gastritis. Biopsied.                        - Normal examined duodenum.                        - A single non-bleeding angioectasia in the stomach.                        - The examination was otherwise normal. Recommendation:        - Await pathology results.                        - Patient has a contact number available for                         emergencies. The signs and symptoms of potential                         delayed complications were discussed with the patient.  Return to normal activities tomorrow. Written                         discharge instructions were provided to the patient.                        - Resume previous diet.                        - Continue present medications.                        - Await pathology results.                        - Proceed with colonoscopy Procedure Code(s):     --- Professional ---                        [id number], Esophagogastroduodenoscopy, flexible,                         transoral; with biopsy, single or multiple Diagnosis  Code(s):     --- Professional ---                        K21.9, Gastro-esophageal reflux disease without                         esophagitis                        R10.12, Left upper quadrant pain                        R10.11, Right upper quadrant pain                        R10.13, Epigastric pain                        K31.819, Angiodysplasia of stomach and duodenum                         without bleeding                        K29.70, Gastritis, unspecified, without bleeding CPT copyright 2019 American Medical Association. All rights reserved. The codes documented in this report are preliminary and upon coder review may  be revised to meet current compliance requirements. Efrain Sella MD, MD 09/21/2020 10:52:24 AM This report has been signed electronically. Number of Addenda: 0 Note Initiated On: 09/21/2020 10:27 AM Estimated Blood Loss:  Estimated blood loss: none. Estimated blood loss: none.      [facility]

## 2020-09-21 NOTE — Anesthesia Procedure Notes (Signed)
Procedure Name: General with mask airway Performed by: Kelton Pillar, CRNA Pre-anesthesia Checklist: Patient identified, Emergency Drugs available, Suction available and Patient being monitored Patient Re-evaluated:Patient Re-evaluated prior to induction Oxygen Delivery Method: Circle system utilized Induction Type: IV induction Placement Confirmation: positive ETCO2 and CO2 detector Dental Injury: Teeth and Oropharynx as per pre-operative assessment

## 2020-09-21 NOTE — Transfer of Care (Signed)
Immediate Anesthesia Transfer of Care Note  Patient: Shelly Huynh  Procedure(s) Performed: ESOPHAGOGASTRODUODENOSCOPY (EGD) WITH PROPOFOL (N/A ) COLONOSCOPY WITH PROPOFOL (N/A )  Patient Location: Endoscopy Unit  Anesthesia Type:General  Level of Consciousness: drowsy, patient cooperative and responds to stimulation  Airway & Oxygen Therapy: Patient Spontanous Breathing and Patient connected to face mask oxygen  Post-op Assessment: Report given to RN and Post -op Vital signs reviewed and stable  Post vital signs: Reviewed and stable  Last Vitals:  Vitals Value Taken Time  BP 93/68 09/21/20 1117  Temp    Pulse 73 09/21/20 1120  Resp 15 09/21/20 1120  SpO2 100 % 09/21/20 1120  Vitals shown include unvalidated device data.  Last Pain:  Vitals:   09/21/20 1116  TempSrc:   PainSc: 0-No pain         Complications: No complications documented.

## 2020-09-21 NOTE — Anesthesia Preprocedure Evaluation (Signed)
Anesthesia Evaluation  Patient identified by MRN, date of birth, ID band Patient awake    Reviewed: Allergy & Precautions, NPO status , Patient's Chart, lab work & pertinent test results  History of Anesthesia Complications Negative for: history of anesthetic complications  Airway Mallampati: III  TM Distance: >3 FB Neck ROM: Full    Dental no notable dental hx.    Pulmonary asthma , sleep apnea and Continuous Positive Airway Pressure Ventilation , former smoker,    breath sounds clear to auscultation- rhonchi (-) wheezing      Cardiovascular hypertension, Pt. on medications +CHF  (-) CAD, (-) Past MI, (-) Cardiac Stents and (-) CABG + Valvular Problems/Murmurs (s/p AVR, MVR, TVR)  Rhythm:Regular Rate:Normal - Systolic murmurs and - Diastolic murmurs    Neuro/Psych  Headaches, PSYCHIATRIC DISORDERS Anxiety Depression    GI/Hepatic Neg liver ROS, hiatal hernia, GERD  ,  Endo/Other  diabetes (diet controlled)  Renal/GU negative Renal ROS     Musculoskeletal  (+) Arthritis ,   Abdominal (+) + obese,   Peds  Hematology  (+) anemia ,   Anesthesia Other Findings Past Medical History: No date: Acid reflux No date: Anemia No date: Anxiety No date: Arthritis     Comment:  OSTEO No date: Asthma No date: CHF (congestive heart failure) (HCC) No date: Chronic anticoagulation No date: Chronic pain No date: Cirrhosis (HCC)     Comment:  NASH No date: Coronary artery disease No date: Depression No date: Diabetes mellitus without complication (HCC) No date: Dyspnea No date: Dysrhythmia     Comment:  AFTER COVID VACCINE ARRYTHMIAS-SEEING [place]  No date: Esophagitis No date: Headache     Comment:  MIGRAINES No date: Heart valve replaced No date: Helicobacter pylori infection No date: High cholesterol No date: High cholesterol No date: History of hiatal hernia No date: History of methicillin resistant  staphylococcus aureus (MRSA) No date: Hyperaldosteronism (HCC) No date: Hypertension No date: Hypokalemia No date: IBS (irritable bowel syndrome) No date: IBS (irritable bowel syndrome) No date: Sciatic pain No date: Sleep apnea     Comment:  USES CPAP   Reproductive/Obstetrics                             Anesthesia Physical Anesthesia Plan  ASA: III  Anesthesia Plan: General   Post-op Pain Management:    Induction: Intravenous  PONV Risk Score and Plan: 2 and Propofol infusion  Airway Management Planned: Natural Airway  Additional Equipment:   Intra-op Plan:   Post-operative Plan:   Informed Consent: I have reviewed the patients History and Physical, chart, labs and discussed the procedure including the risks, benefits and alternatives for the proposed anesthesia with the patient or authorized representative who has indicated his/her understanding and acceptance.     Dental advisory given  Plan Discussed with: CRNA and Anesthesiologist  Anesthesia Plan Comments:         Anesthesia Quick Evaluation

## 2020-09-21 NOTE — Op Note (Signed)
[facility] Patient Name: Shelly Huynh Procedure Date: 09/21/2020 10:26 AM MRN: [id number] Account #: [id number] Date of Birth: December 19, 1970 Admit Type: Outpatient Age: 49 Room: [facility] ENDO ROOM 2 Gender: Female Note Status: Finalized Procedure:             Colonoscopy Indications:           Screening for colorectal malignant neoplasm Providers:             Benay Pike. Alice Reichert MD, MD Referring MD:          Tracie Harrier, MD (Referring MD) Medicines:             Propofol per Anesthesia Complications:         No immediate complications. Procedure:             Pre-Anesthesia Assessment:                        - The risks and benefits of the procedure and the                         sedation options and risks were discussed with the                         patient. All questions were answered and informed                         consent was obtained.                        - Patient identification and proposed procedure were                         verified prior to the procedure by the nurse. The                         procedure was verified in the procedure room.                        - ASA Grade Assessment: III - A patient with severe                         systemic disease.                        - After reviewing the risks and benefits, the patient                         was deemed in satisfactory condition to undergo the                         procedure.                        After obtaining informed consent, the colonoscope was                         passed under direct vision. Throughout the procedure,                         the patient's blood pressure, pulse,  and oxygen                         saturations were monitored continuously. The                         Colonoscope was introduced through the anus and                         advanced to the the cecum, identified by appendiceal                         orifice and ileocecal  valve. The colonoscopy was                         performed without difficulty. The patient tolerated                         the procedure well. The quality of the bowel                         preparation was adequate. The ileocecal valve,                         appendiceal orifice, and rectum were photographed. Findings:      The perianal and digital rectal examinations were normal. Pertinent       negatives include normal sphincter tone and no palpable rectal lesions.      Non-bleeding internal hemorrhoids were found during retroflexion. The       hemorrhoids were mild and Grade I (internal hemorrhoids that do not       prolapse).      A 4 mm polyp was found in the descending colon. The polyp was sessile.       The polyp was removed with a jumbo cold forceps. Resection and retrieval       were complete.      There was a small lipoma, 15 mm in diameter, in the sigmoid colon. This       was biopsied with a cold forceps for histology.      The exam was otherwise without abnormality. Impression:            - Non-bleeding internal hemorrhoids.                        - One 4 mm polyp in the descending colon, removed with                         a jumbo cold forceps. Resected and retrieved.                        - Small lipoma in the sigmoid colon. Biopsied.                        - The examination was otherwise normal. Recommendation:        - Patient has a contact number available for                         emergencies. The signs and symptoms of potential  delayed complications were discussed with the patient.                         Return to normal activities tomorrow. Written                         discharge instructions were provided to the patient.                        - Await pathology results from EGD, also performed                         today.                        - Resume previous diet.                        - Continue present medications.                         - Resume Coumadin (warfarin) at prior dose today.                         Refer to managing physician for further adjustment of                         therapy.                        - Follow lovenox instructions per your cardiologist                         (Dr. Nehemiah Massed).                        - Return to physician assistant in 2 months.                        - Follow up with Laurine Blazer, PA-C at [facility]. [phone].                        - The findings and recommendations were discussed with                         the patient. Procedure Code(s):     --- Professional ---                        [phone], Colonoscopy, flexible; with biopsy, single or                         multiple Diagnosis Code(s):     --- Professional ---                        K64.0, First degree hemorrhoids                        D17.5, Benign lipomatous neoplasm of intra-abdominal  organs                        K63.5, Polyp of colon                        Z12.11, Encounter for screening for malignant neoplasm                         of colon CPT copyright 2019 American Medical Association. All rights reserved. The codes documented in this report are preliminary and upon coder review may  be revised to meet current compliance requirements. Efrain Sella MD, MD 09/21/2020 11:20:47 AM This report has been signed electronically. Number of Addenda: 0 Note Initiated On: 09/21/2020 10:26 AM Scope Withdrawal Time: 0 hours 12 minutes 51 seconds  Total Procedure Duration: 0 hours 16 minutes 42 seconds  Estimated Blood Loss:  Estimated blood loss: none.      [facility]

## 2020-09-21 NOTE — Anesthesia Postprocedure Evaluation (Signed)
Anesthesia Post Note  Patient: SHAQUAVIA WHISONANT  Procedure(s) Performed: ESOPHAGOGASTRODUODENOSCOPY (EGD) WITH PROPOFOL (N/A ) COLONOSCOPY WITH PROPOFOL (N/A )  Patient location during evaluation: Endoscopy Anesthesia Type: General Level of consciousness: awake and alert and oriented Pain management: pain level controlled Vital Signs Assessment: post-procedure vital signs reviewed and stable Respiratory status: spontaneous breathing, nonlabored ventilation and respiratory function stable Cardiovascular status: blood pressure returned to baseline and stable Postop Assessment: no signs of nausea or vomiting Anesthetic complications: no   No complications documented.   Last Vitals:  Vitals:   09/21/20 1126 09/21/20 1136  BP: 115/80 134/81  Pulse: 70 66  Resp: 18 (!) 22  Temp:    SpO2: 95% 96%    Last Pain:  Vitals:   09/21/20 1136  TempSrc:   PainSc: 0-No pain                 Joliana Claflin

## 2020-09-21 NOTE — Interval H&P Note (Signed)
History and Physical Interval Note:  09/21/2020 10:24 AM  Shelly Huynh  has presented today for surgery, with the diagnosis of Screening: RUQ PAIN.  The various methods of treatment have been discussed with the patient and family. After consideration of risks, benefits and other options for treatment, the patient has consented to  Procedure(s): ESOPHAGOGASTRODUODENOSCOPY (EGD) WITH PROPOFOL (N/A) COLONOSCOPY WITH PROPOFOL (N/A) as a surgical intervention.  The patient's history has been reviewed, patient examined, no change in status, stable for surgery.  I have reviewed the patient's chart and labs.  Questions were answered to the patient's satisfaction.     [place], [place]

## 2020-09-22 ENCOUNTER — Encounter: Payer: Self-pay | Admitting: Internal Medicine

## 2020-09-22 LAB — SURGICAL PATHOLOGY

## 2020-10-19 ENCOUNTER — Other Ambulatory Visit: Payer: 59

## 2020-11-23 ENCOUNTER — Other Ambulatory Visit: Payer: 59

## 2020-11-25 ENCOUNTER — Other Ambulatory Visit: Payer: Self-pay

## 2020-11-25 ENCOUNTER — Encounter: Payer: Self-pay | Admitting: Dermatology

## 2020-11-25 ENCOUNTER — Ambulatory Visit: Payer: 59 | Admitting: Dermatology

## 2020-11-25 DIAGNOSIS — L821 Other seborrheic keratosis: Secondary | ICD-10-CM

## 2020-11-25 DIAGNOSIS — L659 Nonscarring hair loss, unspecified: Secondary | ICD-10-CM | POA: Diagnosis not present

## 2020-11-25 NOTE — Progress Notes (Signed)
   Follow-Up Visit   Subjective  Shelly Huynh is a 50 y.o. female who presents for the following: hairloss (Patient here today with concerns about hair loss states she has been having problems for over a year now. She would like discuss injections but would like to try other methods before. Patient states she has tried female Rogaine but did not see much regrowth. It did decrease her shedding. She states it has been over a year since taking. ).  Patient states she does have a history of heart disease.   The following portions of the chart were reviewed this encounter and updated as appropriate:  Tobacco  Allergies  Meds  Problems  Med Hx  Surg Hx  Fam Hx      Objective  Well appearing patient in no apparent distress; mood and affect are within normal limits.  A focused examination was performed including scalp. Relevant physical exam findings are noted in the Assessment and Plan.  Objective  Scalp: Diffuse thinning without scarring with widened part  Negative hair pull test  Images      Assessment & Plan  Alopecia Scalp  C/w androgenetic alopecia.  Chronic condition with expected duration over one year. Condition is bothersome to patient. Currently flared.  Reviewed CBC with diff, TSH, vitamin D, ferritin. Ferritin at low end of normal. Currently taking iron niferox. Will recommend increasing dose. Continue vit D supplementation.  Start prescription [facility] Minoxidil : 7% Tretinoin : 0.0125% Finasteride : 0.1% Proprietary Base without Propylene Glycol : 34DH Applicator : Padded Sponge Top Spironolactone : 0.25%. Sent to [facility].  Pt with NASH with mild cirrhosis. Discussed finasteride 2.5 mg daily. Will defer at this time.   Discussed Red light cap - patient deferred   Discussed Platelet rich plasma injections - patient deferred   Seborrheic Keratoses - Stuck-on, waxy, tan-brown papules - Benign-appearing - Observe - Call for any  changes  Return in about 3 months (around 02/23/2021) for Alopecia follow up .   I, Ruthell Rummage, CMA, am acting as scribe for Forest Gleason, MD.   Documentation: I have reviewed the above documentation for accuracy and completeness, and I agree with the above.  Forest Gleason, MD

## 2020-11-26 ENCOUNTER — Encounter: Payer: Self-pay | Admitting: Dermatology

## 2020-12-10 ENCOUNTER — Other Ambulatory Visit: Payer: Self-pay

## 2020-12-10 ENCOUNTER — Ambulatory Visit
Admission: RE | Admit: 2020-12-10 | Discharge: 2020-12-10 | Disposition: A | Discharge status: Home / Self Care | Payer: 59 | Source: Ambulatory Visit | Attending: General Surgery | Admitting: General Surgery

## 2020-12-10 DIAGNOSIS — N6489 Other specified disorders of breast: Secondary | ICD-10-CM

## 2020-12-22 ENCOUNTER — Other Ambulatory Visit: Payer: Self-pay | Admitting: Internal Medicine

## 2020-12-22 DIAGNOSIS — R1011 Right upper quadrant pain: Secondary | ICD-10-CM

## 2020-12-28 ENCOUNTER — Other Ambulatory Visit: Payer: Self-pay

## 2020-12-28 ENCOUNTER — Ambulatory Visit
Admission: RE | Admit: 2020-12-28 | Discharge: 2020-12-28 | Disposition: A | Discharge status: Home / Self Care | Payer: 59 | Source: Ambulatory Visit | Attending: Internal Medicine | Admitting: Internal Medicine

## 2020-12-28 DIAGNOSIS — R1011 Right upper quadrant pain: Secondary | ICD-10-CM | POA: Insufficient documentation

## 2021-01-04 ENCOUNTER — Telehealth: Payer: Self-pay

## 2021-01-04 NOTE — Telephone Encounter (Signed)
It looks like it's only available in 30 ml which is supposed to be a 30 day supply. I can prescribe two of them at a time. We could also try switching to a spray or dropper bottle instead of the padded sponge top (all 3 are options) to see if that would last longer. If you let me know what she prefers, I can update at [facility]. Thank you!

## 2021-01-04 NOTE — Telephone Encounter (Signed)
Patient called about her medication she has through [facility]. [name] getting a 31m bottle but running out in about two weeks. Can we submit for a bigger bottle? I am unware of this pharmacy/log in or I would help myself.

## 2021-01-05 NOTE — Telephone Encounter (Signed)
Left message for patient to return my call to go over Dr. Robbi Garter information.

## 2021-01-06 NOTE — Telephone Encounter (Signed)
Patient prefers the dropper bottle. RX sent in through [facility]. Patient advised.

## 2021-01-06 NOTE — Telephone Encounter (Signed)
Patient called back. I sent in the wrong compounding RX today compared to what the patient was using. She noticed on the patient portal. When going to correct RX she decided she wanted to stay with what she had but double the quantity. There is no place to do this in the prescription so I have emailed [facility].

## 2021-02-17 ENCOUNTER — Ambulatory Visit: Payer: 59 | Admitting: Dermatology

## 2021-02-18 ENCOUNTER — Other Ambulatory Visit: Payer: Self-pay | Admitting: General Surgery

## 2021-02-18 DIAGNOSIS — N6489 Other specified disorders of breast: Secondary | ICD-10-CM

## 2021-02-24 ENCOUNTER — Ambulatory Visit: Payer: 59 | Admitting: Dermatology

## 2021-03-01 ENCOUNTER — Other Ambulatory Visit: Payer: Self-pay | Admitting: General Surgery

## 2021-03-01 DIAGNOSIS — N6489 Other specified disorders of breast: Secondary | ICD-10-CM

## 2021-04-01 ENCOUNTER — Ambulatory Visit: Payer: 59 | Admitting: Dermatology

## 2021-04-17 ENCOUNTER — Emergency Department: Payer: 59

## 2021-04-17 ENCOUNTER — Other Ambulatory Visit: Payer: Self-pay

## 2021-04-17 ENCOUNTER — Encounter: Payer: Self-pay | Admitting: Intensive Care

## 2021-04-17 ENCOUNTER — Emergency Department
Admission: EM | Admit: 2021-04-17 | Discharge: 2021-04-17 | Disposition: A | Discharge status: Home / Self Care | Payer: 59 | Attending: Emergency Medicine | Admitting: Emergency Medicine

## 2021-04-17 DIAGNOSIS — J45909 Unspecified asthma, uncomplicated: Secondary | ICD-10-CM | POA: Insufficient documentation

## 2021-04-17 DIAGNOSIS — I2510 Atherosclerotic heart disease of native coronary artery without angina pectoris: Secondary | ICD-10-CM | POA: Diagnosis not present

## 2021-04-17 DIAGNOSIS — Z7982 Long term (current) use of aspirin: Secondary | ICD-10-CM | POA: Insufficient documentation

## 2021-04-17 DIAGNOSIS — Z79899 Other long term (current) drug therapy: Secondary | ICD-10-CM | POA: Diagnosis not present

## 2021-04-17 DIAGNOSIS — E119 Type 2 diabetes mellitus without complications: Secondary | ICD-10-CM | POA: Diagnosis not present

## 2021-04-17 DIAGNOSIS — I110 Hypertensive heart disease with heart failure: Secondary | ICD-10-CM | POA: Diagnosis not present

## 2021-04-17 DIAGNOSIS — R000 Tachycardia, unspecified: Secondary | ICD-10-CM | POA: Diagnosis present

## 2021-04-17 DIAGNOSIS — Z7901 Long term (current) use of anticoagulants: Secondary | ICD-10-CM | POA: Diagnosis not present

## 2021-04-17 DIAGNOSIS — R0789 Other chest pain: Secondary | ICD-10-CM

## 2021-04-17 DIAGNOSIS — F17210 Nicotine dependence, cigarettes, uncomplicated: Secondary | ICD-10-CM | POA: Diagnosis not present

## 2021-04-17 DIAGNOSIS — I5043 Acute on chronic combined systolic (congestive) and diastolic (congestive) heart failure: Secondary | ICD-10-CM | POA: Diagnosis not present

## 2021-04-17 DIAGNOSIS — I4892 Unspecified atrial flutter: Secondary | ICD-10-CM | POA: Diagnosis not present

## 2021-04-17 LAB — CBC WITH DIFFERENTIAL/PLATELET
Abs Immature Granulocytes: 0.09 10*3/uL — ABNORMAL HIGH (ref 0.00–0.07)
Basophils Absolute: 0 10*3/uL (ref 0.0–0.1)
Basophils Relative: 1 %
Eosinophils Absolute: 0.1 10*3/uL (ref 0.0–0.5)
Eosinophils Relative: 2 %
HCT: 39.3 % (ref 36.0–46.0)
Hemoglobin: 13.4 g/dL (ref 12.0–15.0)
Immature Granulocytes: 1 %
Lymphocytes Relative: 25 %
Lymphs Abs: 1.8 10*3/uL (ref 0.7–4.0)
MCH: 30.9 pg (ref 26.0–34.0)
MCHC: 34.1 g/dL (ref 30.0–36.0)
MCV: 90.8 fL (ref 80.0–100.0)
Monocytes Absolute: 0.7 10*3/uL (ref 0.1–1.0)
Monocytes Relative: 10 %
Neutro Abs: 4.3 10*3/uL (ref 1.7–7.7)
Neutrophils Relative %: 61 %
Platelets: 247 10*3/uL (ref 150–400)
RBC: 4.33 MIL/uL (ref 3.87–5.11)
RDW: 17.2 % — ABNORMAL HIGH (ref 11.5–15.5)
WBC: 7 10*3/uL (ref 4.0–10.5)
nRBC: 0 % (ref 0.0–0.2)

## 2021-04-17 LAB — BASIC METABOLIC PANEL
Anion gap: 10 (ref 5–15)
BUN: 8 mg/dL (ref 6–20)
CO2: 21 mmol/L — ABNORMAL LOW (ref 22–32)
Calcium: 9.4 mg/dL (ref 8.9–10.3)
Chloride: 108 mmol/L (ref 98–111)
Creatinine, Ser: 0.82 mg/dL (ref 0.44–1.00)
GFR, Estimated: >60 mL/min (ref >60)
Glucose, Bld: 126 mg/dL — ABNORMAL HIGH (ref 70–99)
Potassium: 3.7 mmol/L (ref 3.5–5.1)
Sodium: 139 mmol/L (ref 135–145)

## 2021-04-17 LAB — TROPONIN I (HIGH SENSITIVITY)
Troponin I (High Sensitivity): 504 ng/L (ref <18)
Troponin I (High Sensitivity): 555 ng/L (ref <18)

## 2021-04-17 LAB — PROTIME-INR
INR: 3.1 — ABNORMAL HIGH (ref 0.8–1.2)
Prothrombin Time: 31.7 seconds — ABNORMAL HIGH (ref 11.4–15.2)

## 2021-04-17 LAB — MAGNESIUM: Magnesium: 2.2 mg/dL (ref 1.7–2.4)

## 2021-04-17 MED ORDER — ETOMIDATE 2 MG/ML IV SOLN
INTRAVENOUS | Status: AC | PRN
  Administered 2021-04-17: 10 mg via INTRAVENOUS

## 2021-04-17 MED ORDER — ETOMIDATE 2 MG/ML IV SOLN
10.0000 mg | Freq: Once | INTRAVENOUS | Status: DC
  Filled 2021-04-17: qty 10

## 2021-04-17 MED ORDER — METOPROLOL SUCCINATE ER 50 MG PO TB24: 50.0000 mg | ORAL_TABLET | Freq: Every day | ORAL | Status: DC

## 2021-04-17 MED ORDER — METOPROLOL TARTRATE 5 MG/5ML IV SOLN
5.0000 mg | INTRAVENOUS | Status: AC | PRN
  Administered 2021-04-17 (×3): 5 mg via INTRAVENOUS
  Filled 2021-04-17 (×3): qty 5

## 2021-04-17 NOTE — ED Triage Notes (Signed)
Patient c/o left chest pain and back pain that radiates down bilateral legs. Reports hx back pain but this has been worse. Chest pain started yesterday and awoke her this AM and diaphoretic

## 2021-04-17 NOTE — ED Notes (Signed)
Lab at bedside

## 2021-04-17 NOTE — Sedation Documentation (Signed)
Shock delivered at 87J

## 2021-04-17 NOTE — ED Notes (Signed)
Attempted to obtain repeat trop with no success- called lab to send someone to get blood

## 2021-04-17 NOTE — ED Provider Notes (Signed)
[facility] Emergency Department Provider Note   ____________________________________________   Event Date/Time   First MD Initiated Contact with Patient 04/17/21 [phone]     (approximate)  I have reviewed the triage vital signs and the nursing notes.   HISTORY  Chief Complaint Tachycardia and Chest Pain    HPI Shelly Huynh is a 50 y.o. female with past medical history of hypertension, hyperlipidemia, diabetes, CAD, CHF, mechanical aortic valve on Coumadin, and chronic pain syndrome who presents to the ED complaining of palpitations.  Patient reports that yesterday she started to notice that her heart was racing and her monitor told her that her heart rate was around 140.  This feeling has persisted continually until today and is associated with pain in her chest and worsening of her chronic shortness of breath.  She describes pain in her chest as a burning that extends from shoulder to shoulder, present since the onset of palpitations.  She typically has dyspnea on exertion, but now describes mild shortness of breath even with rest.  She has not noticed any pain or swelling in her legs, denies any fevers or cough.  She has been compliant with her Coumadin that she takes for prosthetic aortic valve.  She denies any history of atrial fibrillation or atrial flutter.        Past Medical History:  Diagnosis Date  . Acid reflux   . Anemia   . Anxiety   . Arthritis    OSTEO  . Asthma   . CHF (congestive heart failure) ([place])   . Chronic anticoagulation   . Chronic pain   . Cirrhosis ([place])    NASH  . Coronary artery disease   . Depression   . Diabetes mellitus without complication ([place])   . Dyspnea   . Dysrhythmia    AFTER COVID VACCINE ARRYTHMIAS-SEEING [place]   . Esophagitis   . Headache    MIGRAINES  . Heart valve replaced   . Helicobacter pylori infection   . High cholesterol   . High cholesterol   . History of hiatal hernia   . History of  methicillin resistant staphylococcus aureus (MRSA)   . Hyperaldosteronism ([place])   . Hypertension   . Hypokalemia   . IBS (irritable bowel syndrome)   . IBS (irritable bowel syndrome)   . Sciatic pain   . Sleep apnea    USES CPAP    Patient Active Problem List   Diagnosis Date Noted  . Lumbosacral L5-S1 facet arthritis (Severe) (Bilateral) 02/18/2020  . Osteoarthritis of hips (Bilateral) 02/18/2020  . Chronic prescription opiate use 01/16/2020  . Neurogenic pain 01/16/2020  . DDD (degenerative disc disease), lumbosacral 01/07/2020  . History of Allergy to iodine 01/07/2020    Class: History of  . History of allergy to radiographic contrast media 01/07/2020  . H/O mechanical aortic valve replacement 12/16/2019  . Chronic low back pain (1ry area of Pain) (Bilateral) (L>R) w/o sciatica 12/16/2019  . Chronic lower extremity pain (2ry area of Pain) (Bilateral) (L>R) 12/16/2019  . Chronic hip pain (3ry area of Pain) (Bilateral) (L>R) 12/16/2019  . Chronic knee pain (4th area of Pain) (Bilateral) (L>R) 12/16/2019  . Chronic recurrent occipital headache (Bilateral) 12/16/2019  . Cervicogenic headache (6th area of Pain) (Bilateral) 12/16/2019  . Chronic neck pain (5th area of Pain) (Bilateral) (L>R) 12/16/2019  . Chronic shoulder pain (Bilateral) (L>R) 12/16/2019  . Chronic upper extremity pain (7th area of Pain) (Bilateral) (R>L) 12/16/2019  . Chronic pain  syndrome 12/16/2019  . Pharmacologic therapy 12/16/2019  . Disorder of skeletal system 12/16/2019  . Problems influencing health status 12/16/2019  . Abnormal MRI, cervical spine (09/06/2019) 12/16/2019  . Cervical central spinal stenosis 12/16/2019  . Cervical foraminal stenosis (C2-3, C4-5) (Left) 12/16/2019  . Abnormal MRI, lumbar spine (08/24/2015) 12/16/2019  . Lumbar facet hypertrophy (L4-5 and L5-S1) (Bilateral) 12/16/2019  . Lumbar facet syndrome (Bilateral) (L>R) 12/16/2019  . Cirrhosis of liver without ascites ([place])  10/28/2019  . Elevated hemoglobin A1c 10/28/2019  . Lumbar radicular pain 10/28/2019  . Bilateral hand numbness 10/22/2019  . Cervicalgia 10/22/2019  . BMI 40.0-44.9, adult ([place]) 04/10/2019  . Irritable bowel syndrome with constipation 12/26/2018  . Status post bariatric surgery 09/12/2018  . Hypertension due to endocrine disorder 08/17/2018  . Hypertension, well controlled 08/14/2018  . Hyperaldosteronism ([place]) 06/24/2018  . Cervical radiculopathy 06/12/2018  . Anxiety state 04/02/2018  . HCAP (healthcare-associated pneumonia) 10/03/2017  . Pleural effusion 10/02/2017  . Chest pain on breathing 09/21/2017  . Aortic valve disease, 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  . Tricuspid valve disorder 09/21/2017  . Mitral valve disease, 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  . SOBOE (shortness of breath on exertion) 04/24/2017  . Positive Helicobacter pylori serology 01/05/2017  . Tobacco abuse 09/19/2016  . On anticoagulant therapy 09/07/2016  . Asthma 09/07/2016  . Diabetes mellitus ([place]) 09/07/2016  . GERD with esophagitis 09/07/2016  . Hypertension 09/07/2016  . 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 Hypercholesterolemia 09/07/2016  . Obstructive sleep apnea syndrome 09/07/2016  . Bilateral leg edema 08/23/2016  . Osteoarthritis of spine with radiculopathy, lumbar region 08/08/2016  . Diabetes mellitus type 2, diet-controlled ([place]) 05/17/2016  . Vitamin D deficiency 05/17/2016  . Gastroesophageal reflux disease without esophagitis 10/12/2015  . Spondylosis of lumbar region without myelopathy or radiculopathy 10/12/2015  . Adiposity 06/30/2015  . Compulsive tobacco user syndrome 06/30/2015  . Nicotine dependence, uncomplicated 23/30/0762  . Chronic hypokalemia 06/15/2015  . Palpitations 05/18/2015  . Chronic anticoagulation (Coumadin) 05/07/2015  . Benign essential HTN 05/05/2015  . Absolute anemia 04/26/2015  . Acute on chronic combined systolic and diastolic congestive heart failure ([place]) 04/08/2015   . H/O aortic valve replacement 04/07/2015  . H/O heart valve replacement with mechanical valve 04/07/2015  . H/O prosthetic heart valve 04/07/2015  . 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 Acute right heart failure ([place]) 04/04/2015  . History of open heart surgery 04/03/2015  . H/O mitral valve replacement with mechanical valve 04/03/2015  . 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 Presence of prosthetic heart valve 04/03/2015  . History of tricuspid valve replacement with bioprosthetic valve 04/03/2015  . Refusal of blood transfusions as patient is Jehovah's Witness 03/12/2015  . Acute respiratory failure with hypoxemia ([place]) 03/05/2015  . Coronary artery disease due to calcified coronary lesion 03/04/2015  . Obstructive apnea 10/28/2014  . Enlarged heart 10/28/2014  . Combined fat and carbohydrate induced hyperlipemia 09/11/2014    Past Surgical History:  Procedure Laterality Date  . ABDOMINAL HYSTERECTOMY    . AORTIC VALVE REPLACEMENT    . BREAST BIOPSY Left 07/25/2017   BENIGN BREAST TISSUE WITH MICROCALCIFICATIONS  . BREAST BIOPSY Right 10/22/2019   Affirm Bx- X-clip- CSL, no atypia  . BREAST BIOPSY Right 10/22/2019   Affirm Bx- Ribbon clip- CSL, no atypia  . BREAST BIOPSY Right 11/20/2019   Procedure: BREAST BIOPSY WITH NEEDLE LOCALIZATION x 2;  Surgeon: Herbert Pun, MD;  Location: [facility] ORS;  Service: General;  Laterality: Right;  . BREAST EXCISIONAL BIOPSY  Right 1992   NEG  . BREAST LUMPECTOMY Right 11/20/2019   complex sclerosing lesion x 2  . CARDIAC VALVE SURGERY    . CESAREAN SECTION     X2  . COLONOSCOPY WITH PROPOFOL N/A 09/21/2020   Procedure: COLONOSCOPY WITH PROPOFOL;  Surgeon: Toledo, Benay Pike, MD;  Location: [facility] ENDOSCOPY;  Service: Gastroenterology;  Laterality: N/A;  . ESOPHAGOGASTRODUODENOSCOPY (EGD) WITH PROPOFOL N/A 10/07/2016   Procedure: ESOPHAGOGASTRODUODENOSCOPY (EGD) WITH PROPOFOL;  Surgeon: Jonathon Bellows, MD;  Location:  [facility] ENDOSCOPY;  Service: Endoscopy;  Laterality: N/A;  . ESOPHAGOGASTRODUODENOSCOPY (EGD) WITH PROPOFOL N/A 09/21/2020   Procedure: ESOPHAGOGASTRODUODENOSCOPY (EGD) WITH PROPOFOL;  Surgeon: Toledo, Benay Pike, MD;  Location: [facility] ENDOSCOPY;  Service: Gastroenterology;  Laterality: N/A;  . HERNIA REPAIR    . LAPAROSCOPIC GASTRIC BAND REMOVAL WITH LAPAROSCOPIC GASTRIC SLEEVE RESECTION    . MITRAL VALVE REPLACEMENT    . TRICUSPID VALVE REPLACEMENT    . VENTRAL HERNIA REPAIR      Prior to Admission medications   Medication Sig Start Date End Date Taking? Authorizing Provider  albuterol (VENTOLIN HFA) 108 (90 Base) MCG/ACT inhaler Inhale 2 puffs into the lungs every 6 (six) hours as needed for wheezing or shortness of breath.    [provider]  aspirin EC 81 MG tablet Take 81 mg daily by mouth.    [provider]  candesartan (ATACAND) 8 MG tablet Take 8 mg by mouth at bedtime.  12/07/19   [provider]  carvedilol (COREG) 6.25 MG tablet Take 6.25 mg by mouth 2 (two) times daily. 12/07/19   [provider]  Cholecalciferol 25 MCG (1000 UT) tablet Take 1,000 Units by mouth daily.     [provider]  cyclobenzaprine (FLEXERIL) 10 MG tablet Take 10 mg by mouth 3 (three) times daily as needed for muscle spasms.  01/13/15   [provider]  diazepam (VALIUM) 5 MG tablet Take 5 mg by mouth every 8 (eight) hours as needed for anxiety or muscle spasms.     [provider]  docusate sodium (COLACE) 100 MG capsule Take 100 mg by mouth daily as needed for mild constipation.     [provider]  DULoxetine (CYMBALTA) 30 MG capsule Take 30 mg by mouth at bedtime.    [provider]  enoxaparin (LOVENOX) 120 MG/0.8ML injection Inject 120 mg into the skin in the morning and at bedtime. PT WAS INSTRUCTED TO STOP HER ASPIRIN AND COUMADIN AND START LOVENOX BRIDGE IN PREPARATION FOR HER COLONOSCOPY ON 8-20 AND HER CHOLECYSTECTOMY ON  8-24 Patient not taking: Reported on 11/25/2020    [provider]  Flaxseed, Linseed, (FLAX SEED OIL PO) Take 1 tablet by mouth daily. Patient not taking: Reported on 11/25/2020    [provider]  fluticasone (FLONASE) 50 MCG/ACT nasal spray Place 2 sprays into both nostrils daily as needed for allergies or rhinitis.    [provider]  gabapentin (NEURONTIN) 300 MG capsule Take 1 capsule (300 mg total) by mouth at bedtime. 03/17/20 07/09/20  Milinda Pointer, MD  Galcanezumab-gnlm [facility]) Inject into the skin.    [provider]  Galcanezumab-gnlm 120 MG/ML SOSY Inject 120 mg into the skin every 30 (thirty) days.     [provider]  hydrocortisone 2.5 % cream Apply 1 application topically 2 (two) times a week.    [provider]  iron polysaccharides (NIFEREX) 150 MG capsule Take 150 mg by mouth daily.    [provider]  levalbuterol (XOPENEX) 1.25 MG/0.5ML nebulizer solution Take 1.25 mg by nebulization every 4 (four) hours as needed for wheezing or shortness of breath.    [provider]  levocetirizine (XYZAL) 5 MG tablet Take 5 mg by mouth every evening.  05/10/17   [provider]  linaclotide (LINZESS) 72 MCG capsule Take 72 mcg by mouth as needed.     [provider]  meclizine (ANTIVERT) 25 MG tablet Take 25 mg by mouth 3 (three) times daily as needed for dizziness.  05/11/15   [provider]  memantine (NAMENDA) 5 MG tablet Take 5 mg by mouth 2 (two) times daily.     [provider]  Minoxidil 5 % FOAM Apply 1 application topically daily.     [provider]  ondansetron (ZOFRAN) 4 MG tablet Take 1 tablet (4 mg total) by mouth daily as needed for nausea or vomiting. 07/03/20   Harvest Dark, MD  oxyCODONE (ROXICODONE) 5 MG immediate release tablet Take 1 tablet (5 mg total) by mouth every 8 (eight) hours as needed. 07/03/20 07/03/21  Harvest Dark, MD   pantoprazole (PROTONIX) 40 MG tablet Take 40 mg by mouth 2 (two) times daily.  06/30/16   [provider]  pravastatin (PRAVACHOL) 20 MG tablet Take 20 mg by mouth at bedtime.  12/07/14   [provider]  spironolactone (ALDACTONE) 25 MG tablet Take 25 mg by mouth daily.     [provider]  warfarin (COUMADIN) 4 MG tablet Take 4 mg by mouth 3 (three) times a week.    [provider]  warfarin (COUMADIN) 7.5 MG tablet Take 1 tablet (7.5 mg total) one time only at 6 PM by mouth. 10/04/17   Bettey Costa, MD    Allergies Ivp dye [iodinated diagnostic agents], Sulfa antibiotics, Banana, Guaifenesin, Hctz [hydrochlorothiazide], Keflex [cephalexin], Losartan, Povidone iodine, Lisinopril, and Mucinex [guaifenesin er]  Family History  Problem Relation Age of Onset  . Breast cancer Paternal Grandmother     Social History Social History   Tobacco Use  . Smoking status: Current Every Day Smoker    Packs/day: 0.50    Years: 25.00    Pack years: 12.50    Types: Cigarettes  . Smokeless tobacco: Never Used  Vaping Use  . Vaping Use: Never used  Substance Use Topics  . Alcohol use: No  . Drug use: No    Review of Systems  Constitutional: No fever/chills Eyes: No visual changes. ENT: No sore throat. Cardiovascular: Positive for palpitations and chest pain. Respiratory: Positive for shortness of breath. Gastrointestinal: No abdominal pain.  No nausea, no vomiting.  No diarrhea.  No constipation. Genitourinary: Negative for dysuria. Musculoskeletal: Negative for back pain. Skin: Negative for rash. Neurological: Negative for headaches, focal weakness or numbness.  ____________________________________________   PHYSICAL EXAM:  VITAL SIGNS: ED Triage Vitals  Enc Vitals Group     BP      Pulse      Resp      Temp      Temp src      SpO2      Weight      Height      Head Circumference      Peak Flow      Pain Score      Pain Loc      Pain  Edu?      Excl. in [place]?     Constitutional: Alert and oriented. Eyes: Conjunctivae are normal. Head: Atraumatic. Nose: No  congestion/rhinnorhea. Mouth/Throat: Mucous membranes are moist. Neck: Normal ROM Cardiovascular: Tachycardic, regular rhythm. Grossly normal heart sounds.  2+ radial pulses bilaterally. Respiratory: Normal respiratory effort.  No retractions. Lungs CTAB. Gastrointestinal: Soft and nontender. No distention. Genitourinary: deferred Musculoskeletal: No lower extremity tenderness nor edema. Neurologic:  Normal speech and language. No gross focal neurologic deficits are appreciated. Skin:  Skin is warm, dry and intact. No rash noted. Psychiatric: Mood and affect are normal. Speech and behavior are normal.  ____________________________________________   LABS (all labs ordered are listed, but only abnormal results are displayed)  Labs Reviewed  CBC WITH DIFFERENTIAL/PLATELET - Abnormal; Notable for the following components:      Result Value   RDW 17.2 (*)    Abs Immature Granulocytes 0.09 (*)    All other components within normal limits  BASIC METABOLIC PANEL - Abnormal; Notable for the following components:   CO2 21 (*)    Glucose, Bld 126 (*)    All other components within normal limits  PROTIME-INR - Abnormal; Notable for the following components:   Prothrombin Time 31.7 (*)    INR 3.1 (*)    All other components within normal limits  TROPONIN I (HIGH SENSITIVITY) - Abnormal; Notable for the following components:   Troponin I (High Sensitivity) 504 (*)    All other components within normal limits  TROPONIN I (HIGH SENSITIVITY) - Abnormal; Notable for the following components:   Troponin I (High Sensitivity) 555 (*)    All other components within normal limits  MAGNESIUM   ____________________________________________  EKG  ED ECG REPORT I, Blake Divine, the attending physician, personally viewed and interpreted this ECG.   Date: 04/17/2021  EKG  Time: 7:42  Rate: 139  Rhythm: Sinus tachycardia vs atrial flutter with 2:1 block  Axis: Normal  Intervals:none  ST&T Change: Lateral T wave inversions  ED ECG REPORT I, Blake Divine, the attending physician, personally viewed and interpreted this ECG.   Date: 04/17/2021  EKG Time: 9:49  Rate: 95  Rhythm: Atrial flutter with variable block  Axis: Normal  Intervals:none  ST&T Change: Lateral T wave changes    PROCEDURES  Procedure(s) performed (including Critical Care):  .Critical Care Performed by: Blake Divine, MD Authorized by: Blake Divine, MD   Critical care provider statement:    Critical care time (minutes):  45   Critical care time was exclusive of:  Separately billable procedures and treating other patients and teaching time   Critical care was necessary to treat or prevent imminent or life-threatening deterioration of the following conditions:  Cardiac failure   Critical care was time spent personally by me on the following activities:  Discussions with consultants, evaluation of patient's response to treatment, examination of patient, ordering and performing treatments and interventions, ordering and review of laboratory studies, ordering and review of radiographic studies, pulse oximetry, re-evaluation of patient's condition, obtaining history from patient or surrogate and review of old charts   I assumed direction of critical care for this patient from another provider in my specialty: no    .Cardioversion  Date/Time: 04/17/2021 1:10 PM Performed by: Blake Divine, MD Authorized by: Blake Divine, MD   Consent:    Consent obtained:  Verbal   Consent given by:  Patient   Risks discussed:  Cutaneous burn, death, induced arrhythmia and pain   Alternatives discussed:  Rate-control medication, referral and observation Pre-procedure details:    Cardioversion basis:  Emergent   Rhythm:  Atrial flutter   Electrode placement:  Anterior-posterior  Patient  sedated: Yes. Refer to sedation procedure documentation for details of sedation.  Attempt one:    Cardioversion mode:  Synchronous   Waveform:  Monophasic   Shock (Joules):  120   Shock outcome:  Conversion to normal sinus rhythm Post-procedure details:    Patient status:  Awake   Patient tolerance of procedure:  Tolerated well, no immediate complications .Sedation  Date/Time: 04/17/2021 1:11 PM Performed by: Blake Divine, MD Authorized by: Blake Divine, MD   Consent:    Consent obtained:  Verbal   Consent given by:  Patient   Risks discussed:  Allergic reaction, prolonged hypoxia resulting in organ damage, prolonged sedation necessitating reversal, respiratory compromise necessitating ventilatory assistance and intubation, vomiting, nausea, inadequate sedation and dysrhythmia   Alternatives discussed:  Anxiolysis and analgesia without sedation Universal protocol:    Immediately prior to procedure, a time out was called: yes     Patient identity confirmed:  Verbally with patient and arm band Indications:    Procedure performed:  Cardioversion   Procedure necessitating sedation performed by:  Physician performing sedation Pre-sedation assessment:    Time since last food or drink:  6   ASA classification: class 2 - patient with mild systemic disease     Mouth opening:  2 finger widths   Thyromental distance:  3 finger widths   Mallampati score:  II - soft palate, uvula, fauces visible   Neck mobility: normal     Pre-sedation assessments completed and reviewed: airway patency, cardiovascular function, hydration status, mental status, nausea/vomiting, pain level, respiratory function and temperature     Pre-sedation assessment completed:  04/17/2021 10:10 AM Immediate pre-procedure details:    Reassessment: Patient reassessed immediately prior to procedure     Reviewed: vital signs, relevant labs/tests and NPO status     Verified: bag valve mask available, emergency equipment  available, intubation equipment available, IV patency confirmed, oxygen available and suction available   Procedure details (see MAR for exact dosages):    Preoxygenation:  Nasal cannula   Sedation:  Etomidate   Intended level of sedation: deep   Analgesia:  None   Intra-procedure monitoring:  Blood pressure monitoring, continuous capnometry, frequent LOC assessments, frequent vital sign checks, continuous pulse oximetry and cardiac monitor   Intra-procedure events: none     Intra-procedure management:  Airway repositioning   Total Provider sedation time (minutes):  4 Post-procedure details:    Post-sedation assessment completed:  04/17/2021 10:30 AM   Attendance: Constant attendance by certified staff until patient recovered     Recovery: Patient returned to pre-procedure baseline     Post-sedation assessments completed and reviewed: airway patency, cardiovascular function, hydration status, mental status, nausea/vomiting, pain level, respiratory function and temperature     Patient is stable for discharge or admission: yes     Procedure completion:  Tolerated well, no immediate complications     ____________________________________________   INITIAL IMPRESSION / ASSESSMENT AND PLAN / ED COURSE       50 year old female with past medical history of hypertension, hyperlipidemia, diabetes, CAD, CHF, mechanical aortic valve on Coumadin, and chronic pain syndrome who presents to the ED with palpitations, chest pain, and acute on chronic shortness of breath starting yesterday.  Patient noted to be tachycardic with stable BP on arrival, heart rate consistently right around 140.  No P waves are visible on EKG and while I would consider sinus tachycardia, this seems more likely to be atrial flutter with 2-1 block.  We will attempt to control  heart rate with IV metoprolol, check electrolytes and troponin.  Electrolytes are unremarkable, troponin noted to be elevated which is likely demand  ischemia related to patient's accelerated rate.  Patient with occasional slowed rate due to variable block related to atrial flutter following a total of 15 mg IV metoprolol.  Case discussed with Dr. Saralyn Pilar of cardiology, who recommends proceeding with cardioversion back to normal sinus rhythm given her INR is within target range.  Risks and benefits were explained to patient and she agrees to proceed.  She was sedated with etomidate and cardioverted to normal sinus rhythm with 120 J.  Patient now awake and alert, stating she feels much better, chest pain and shortness of breath have resolved.  Repeat troponin without significant elevation from previous and Dr. Saralyn Pilar agrees that this is rate related demand ischemia, no indication for further intervention at this time.  Patient unfortunately remains bradycardic and we will hold off from starting her on a beta-blocker at this time.  [name] appropriate for discharge home with close cardiology follow-up, she was counseled to return to the ED for new worsening symptoms.  Patient agrees with plan.      ____________________________________________   FINAL CLINICAL IMPRESSION(S) / ED DIAGNOSES  Final diagnoses:  Atrial flutter, unspecified type ([place])  Atypical chest pain     ED Discharge Orders    None       Note:  This document was prepared using Dragon voice recognition software and may include unintentional dictation errors.   Blake Divine, MD 04/17/21 [phone]

## 2021-06-10 ENCOUNTER — Ambulatory Visit
Admission: RE | Admit: 2021-06-10 | Discharge: 2021-06-10 | Disposition: A | Discharge status: Home / Self Care | Payer: 59 | Source: Ambulatory Visit | Attending: General Surgery | Admitting: General Surgery

## 2021-06-10 ENCOUNTER — Other Ambulatory Visit: Payer: Self-pay | Admitting: General Surgery

## 2021-06-10 ENCOUNTER — Other Ambulatory Visit: Payer: Self-pay

## 2021-06-10 DIAGNOSIS — N6489 Other specified disorders of breast: Secondary | ICD-10-CM | POA: Insufficient documentation

## 2021-07-07 ENCOUNTER — Other Ambulatory Visit: Payer: Self-pay

## 2021-07-07 ENCOUNTER — Other Ambulatory Visit: Payer: Self-pay | Admitting: General Surgery

## 2021-07-07 ENCOUNTER — Ambulatory Visit (INDEPENDENT_AMBULATORY_CARE_PROVIDER_SITE_OTHER): Payer: 59 | Admitting: Dermatology

## 2021-07-07 DIAGNOSIS — L649 Androgenic alopecia, unspecified: Secondary | ICD-10-CM | POA: Diagnosis not present

## 2021-07-07 DIAGNOSIS — R921 Mammographic calcification found on diagnostic imaging of breast: Secondary | ICD-10-CM

## 2021-07-07 NOTE — Progress Notes (Signed)
   Follow-Up Visit   Subjective  Shelly Huynh is a 50 y.o. female who presents for the following: Follow-up (Patient here today for alopecia follow up. Patient currently using Hairstim and advises she has had some improvement. Patient is also taking Viviscal. ).    The following portions of the chart were reviewed this encounter and updated as appropriate:   Tobacco  Allergies  Meds  Problems  Med Hx  Surg Hx  Fam Hx      Review of Systems:  No other skin or systemic complaints except as noted in HPI or Assessment and Plan.  Objective  Well appearing patient in no apparent distress; mood and affect are within normal limits.  A focused examination was performed including scalp. Relevant physical exam findings are noted in the Assessment and Plan.  Scalp Frontal scalp thinning with intact frontal hairline and miniaturization           Assessment & Plan  Androgenetic alopecia Scalp  Chronic condition with duration or expected duration over one year. Currently well-controlled.  Significantly improved.  Continue Hairstim once daily. Patient's scalp got irritated when using it twice daily. Minoxidil: 6% Tretinoin: 0.0125% Finasteride: 0.1% Spironolactone: 0.25%  Patient also using Viviscal supplement  Return in about 1 year (around 07/07/2022) for androgenetic alopecia.  Graciella Belton, RMA, am acting as scribe for Forest Gleason, MD .  Documentation: I have reviewed the above documentation for accuracy and completeness, and I agree with the above.  Forest Gleason, MD

## 2021-07-07 NOTE — Patient Instructions (Signed)

## 2021-07-12 ENCOUNTER — Encounter: Payer: Self-pay | Admitting: Dermatology

## 2021-07-13 ENCOUNTER — Ambulatory Visit: Admission: RE | Admit: 2021-07-13 | Payer: 59 | Source: Ambulatory Visit

## 2021-07-19 ENCOUNTER — Encounter: Payer: Self-pay | Admitting: Dermatology

## 2021-07-20 ENCOUNTER — Ambulatory Visit: Payer: 59

## 2021-07-30 ENCOUNTER — Other Ambulatory Visit: Payer: Self-pay

## 2021-07-30 ENCOUNTER — Ambulatory Visit
Admission: RE | Admit: 2021-07-30 | Discharge: 2021-07-30 | Disposition: A | Discharge status: Home / Self Care | Payer: 59 | Source: Ambulatory Visit | Attending: General Surgery | Admitting: General Surgery

## 2021-07-30 DIAGNOSIS — R921 Mammographic calcification found on diagnostic imaging of breast: Secondary | ICD-10-CM | POA: Insufficient documentation

## 2021-07-30 MED ORDER — GADOBUTROL 1 MMOL/ML IV SOLN
10.0000 mL | Freq: Once | INTRAVENOUS | Status: AC | PRN
  Administered 2021-07-30: 10 mL via INTRAVENOUS

## 2021-08-21 ENCOUNTER — Emergency Department: Payer: 59

## 2021-08-21 ENCOUNTER — Other Ambulatory Visit: Payer: Self-pay

## 2021-08-21 ENCOUNTER — Encounter: Payer: Self-pay | Admitting: Emergency Medicine

## 2021-08-21 ENCOUNTER — Emergency Department
Admission: EM | Admit: 2021-08-21 | Discharge: 2021-08-21 | Disposition: A | Discharge status: Home / Self Care | Payer: 59 | Attending: Emergency Medicine | Admitting: Emergency Medicine

## 2021-08-21 DIAGNOSIS — M25561 Pain in right knee: Secondary | ICD-10-CM | POA: Insufficient documentation

## 2021-08-21 DIAGNOSIS — I2510 Atherosclerotic heart disease of native coronary artery without angina pectoris: Secondary | ICD-10-CM | POA: Insufficient documentation

## 2021-08-21 DIAGNOSIS — I10 Essential (primary) hypertension: Secondary | ICD-10-CM | POA: Insufficient documentation

## 2021-08-21 DIAGNOSIS — Z7982 Long term (current) use of aspirin: Secondary | ICD-10-CM | POA: Insufficient documentation

## 2021-08-21 DIAGNOSIS — E119 Type 2 diabetes mellitus without complications: Secondary | ICD-10-CM | POA: Diagnosis not present

## 2021-08-21 DIAGNOSIS — Z79899 Other long term (current) drug therapy: Secondary | ICD-10-CM | POA: Diagnosis not present

## 2021-08-21 DIAGNOSIS — F17210 Nicotine dependence, cigarettes, uncomplicated: Secondary | ICD-10-CM | POA: Diagnosis not present

## 2021-08-21 DIAGNOSIS — J45909 Unspecified asthma, uncomplicated: Secondary | ICD-10-CM | POA: Diagnosis not present

## 2021-08-21 MED ORDER — HYDROCODONE-ACETAMINOPHEN 5-325 MG PO TABS
1.0000 | ORAL_TABLET | Freq: Once | ORAL | Status: AC
Start: 2021-08-21 — End: 2021-08-21
  Administered 2021-08-21: 1 via ORAL
  Filled 2021-08-21: qty 1

## 2021-08-21 MED ORDER — PREDNISONE 20 MG PO TABS
60.0000 mg | ORAL_TABLET | Freq: Once | ORAL | Status: AC
  Administered 2021-08-21: 60 mg via ORAL
  Filled 2021-08-21: qty 3

## 2021-08-21 MED ORDER — PREDNISONE 10 MG (21) PO TBPK: ORAL_TABLET | ORAL | 0 refills | Status: DC

## 2021-08-21 NOTE — ED Triage Notes (Signed)
Pt to ED via POV with c/o R knee pain, pt states pain worse than normal, states hx of arthritis.

## 2021-08-21 NOTE — Discharge Instructions (Signed)
Take tapered steroid as directed. Please follow-up with orthopedics, Dr. Roland Rack.

## 2021-08-21 NOTE — ED Provider Notes (Signed)
[facility]EMERGENCY DEPARTMENT  ____________________________________________  Time seen: Approximately 9:47 PM  I have reviewed the triage vital signs and the nursing notes.   HISTORY  Chief Complaint Knee Pain   Historian Patient     HPI Shelly Huynh is a 50 y.o. female with a history of CHF, cirrhosis and chronic coagulation with Coumadin, presents to the emergency department with right knee pain.  Patient reports that right knee pain occasionally radiates along the lateral aspect of her leg to the right ankle.  She denies falls or mechanisms of trauma.  Patient states that she has had a recent flare of her sciatica and took taper prednisone finished prescription on Thursday.  She reports that she did not have any knee pain while taking prednisone.  She states that she is limited on what medication she can take given her history and has been trying topical Voltaren.  No chest pain, chest tightness or abdominal pain.   Past Medical History:  Diagnosis Date   Acid reflux    Anemia    Anxiety    Arthritis    OSTEO   Asthma    CHF (congestive heart failure) (HCC)    Chronic anticoagulation    Chronic pain    Cirrhosis (HCC)    NASH   Coronary artery disease    Depression    Diabetes mellitus without complication (HCC)    Dyspnea    Dysrhythmia    AFTER COVID VACCINE ARRYTHMIAS-SEEING [place]    Esophagitis    Headache    MIGRAINES   Heart valve replaced    Helicobacter pylori infection    High cholesterol    High cholesterol    History of hiatal hernia    History of methicillin resistant staphylococcus aureus (MRSA)    Hyperaldosteronism (HCC)    Hypertension    Hypokalemia    IBS (irritable bowel syndrome)    IBS (irritable bowel syndrome)    Sciatic pain    Sleep apnea    USES CPAP     Immunizations up to date:  Yes.     Past Medical History:  Diagnosis Date   Acid reflux    Anemia    Anxiety    Arthritis    OSTEO   Asthma    CHF  (congestive heart failure) (HCC)    Chronic anticoagulation    Chronic pain    Cirrhosis (HCC)    NASH   Coronary artery disease    Depression    Diabetes mellitus without complication (HCC)    Dyspnea    Dysrhythmia    AFTER COVID VACCINE ARRYTHMIAS-SEEING [place]    Esophagitis    Headache    MIGRAINES   Heart valve replaced    Helicobacter pylori infection    High cholesterol    High cholesterol    History of hiatal hernia    History of methicillin resistant staphylococcus aureus (MRSA)    Hyperaldosteronism (HCC)    Hypertension    Hypokalemia    IBS (irritable bowel syndrome)    IBS (irritable bowel syndrome)    Sciatic pain    Sleep apnea    USES CPAP    Patient Active Problem List   Diagnosis Date Noted   Lumbosacral L5-S1 facet arthritis (Severe) (Bilateral) 02/18/2020   Osteoarthritis of hips (Bilateral) 02/18/2020   Chronic prescription opiate use 01/16/2020   Neurogenic pain 01/16/2020   DDD (degenerative disc disease), lumbosacral 01/07/2020   History of Allergy to iodine 01/07/2020    Class:  History of   History of allergy to radiographic contrast media 01/07/2020   H/O mechanical aortic valve replacement 12/16/2019   Chronic low back pain (1ry area of Pain) (Bilateral) (L>R) w/o sciatica 12/16/2019   Chronic lower extremity pain (2ry area of Pain) (Bilateral) (L>R) 12/16/2019   Chronic hip pain (3ry area of Pain) (Bilateral) (L>R) 12/16/2019   Chronic knee pain (4th area of Pain) (Bilateral) (L>R) 12/16/2019   Chronic recurrent occipital headache (Bilateral) 12/16/2019   Cervicogenic headache (6th area of Pain) (Bilateral) 12/16/2019   Chronic neck pain (5th area of Pain) (Bilateral) (L>R) 12/16/2019   Chronic shoulder pain (Bilateral) (L>R) 12/16/2019   Chronic upper extremity pain (7th area of Pain) (Bilateral) (R>L) 12/16/2019   Chronic pain syndrome 12/16/2019   Pharmacologic therapy 12/16/2019   Disorder of skeletal system 12/16/2019   Problems  influencing health status 12/16/2019   Abnormal MRI, cervical spine (09/06/2019) 12/16/2019   Cervical central spinal stenosis 12/16/2019   Cervical foraminal stenosis (C2-3, C4-5) (Left) 12/16/2019   Abnormal MRI, lumbar spine (08/24/2015) 12/16/2019   Lumbar facet hypertrophy (L4-5 and L5-S1) (Bilateral) 12/16/2019   Lumbar facet syndrome (Bilateral) (L>R) 12/16/2019   Cirrhosis of liver without ascites (HCC) 10/28/2019   Elevated hemoglobin A1c 10/28/2019   Lumbar radicular pain 10/28/2019   Bilateral hand numbness 10/22/2019   Cervicalgia 10/22/2019   BMI 40.0-44.9, adult ([place]) 04/10/2019   Irritable bowel syndrome with constipation 12/26/2018   Status post bariatric surgery 09/12/2018   Hypertension due to endocrine disorder 08/17/2018   Hypertension, well controlled 08/14/2018   Hyperaldosteronism ([place]) 06/24/2018   Cervical radiculopathy 06/12/2018   Anxiety state 04/02/2018   HCAP (healthcare-associated pneumonia) 10/03/2017   Pleural effusion 10/02/2017   Chest pain on breathing 09/21/2017   Aortic valve disease, 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   Tricuspid valve disorder 09/21/2017   Mitral valve disease, 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   SOBOE (shortness of breath on exertion) 46/96/2952   Positive Helicobacter pylori serology 01/05/2017   Tobacco abuse 09/19/2016   On anticoagulant therapy 09/07/2016   Asthma 09/07/2016   Diabetes mellitus ([place]) 09/07/2016   GERD with esophagitis 09/07/2016   Hypertension 09/07/2016   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   Hypercholesterolemia 09/07/2016   Obstructive sleep apnea syndrome 09/07/2016   Bilateral leg edema 08/23/2016   Osteoarthritis of spine with radiculopathy, lumbar region 08/08/2016   Diabetes mellitus type 2, diet-controlled ([place]) 05/17/2016   Vitamin D deficiency 05/17/2016   Gastroesophageal reflux disease without esophagitis 10/12/2015   Spondylosis of lumbar region without myelopathy or radiculopathy 10/12/2015   Adiposity  06/30/2015   Compulsive tobacco user syndrome 06/30/2015   Nicotine dependence, uncomplicated 84/13/2440   Chronic hypokalemia 06/15/2015   Palpitations 05/18/2015   Chronic anticoagulation (Coumadin) 05/07/2015   Benign essential HTN 05/05/2015   Absolute anemia 04/26/2015   Acute on chronic combined systolic and diastolic congestive heart failure ([place]) 04/08/2015   H/O aortic valve replacement 04/07/2015   H/O heart valve replacement with mechanical valve 04/07/2015   H/O prosthetic heart valve 04/07/2015   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 04/07/2015   Acute right heart failure ([place]) 04/04/2015   History of open heart surgery 04/03/2015   H/O mitral valve replacement with mechanical valve 04/03/2015   H/O tricuspid valve replacement 04/03/2015   Presence of prosthetic heart valve 04/03/2015   History of tricuspid valve replacement with bioprosthetic valve 04/03/2015   Refusal of blood transfusions as patient is Jehovah's Witness 03/12/2015   Acute respiratory failure with hypoxemia ([place]) 03/05/2015   Coronary artery  disease due to calcified coronary lesion 03/04/2015   Obstructive apnea 10/28/2014   Enlarged heart 10/28/2014   Combined fat and carbohydrate induced hyperlipemia 09/11/2014    Past Surgical History:  Procedure Laterality Date   ABDOMINAL HYSTERECTOMY     AORTIC VALVE REPLACEMENT     BREAST BIOPSY Left 07/25/2017   BENIGN BREAST TISSUE WITH MICROCALCIFICATIONS   BREAST BIOPSY Right 10/22/2019   Affirm Bx- X-clip- CSL, no atypia   BREAST BIOPSY Right 10/22/2019   Affirm Bx- Ribbon clip- CSL, no atypia   BREAST BIOPSY Right 11/20/2019   Procedure: BREAST BIOPSY WITH NEEDLE LOCALIZATION x 2;  Surgeon: Herbert Pun, MD;  Location: [facility] ORS;  Service: General;  Laterality: Right;   BREAST EXCISIONAL BIOPSY Right 1992   NEG   BREAST LUMPECTOMY Right 11/20/2019   complex sclerosing lesion x 2   CARDIAC VALVE SURGERY     CESAREAN SECTION     X2    COLONOSCOPY WITH PROPOFOL N/A 09/21/2020   Procedure: COLONOSCOPY WITH PROPOFOL;  Surgeon: Toledo, Benay Pike, MD;  Location: [facility] END[facility]COPY;  Service: Gastroenterology;  Laterality: N/A;   ESOPHAGOGASTRODUODENOSCOPY (EGD) WITH PROPOFOL N/A 10/07/2016   Procedure: ESOPHAGOGASTRODUODENOSCOPY (EGD) WITH PROPOFOL;  Surgeon: Jonathon Bellows, MD;  Location: [facility] END[facility]COPY;  Service: Endoscopy;  Laterality: N/A;   ESOPHAGOGASTRODUODENOSCOPY (EGD) WITH PROPOFOL N/A 09/21/2020   Procedure: ESOPHAGOGASTRODUODENOSCOPY (EGD) WITH PROPOFOL;  Surgeon: Toledo, Benay Pike, MD;  Location: [facility] END[facility];  Service: Gastroenterology;  Laterality: N/A;   HERNIA REPAIR     LAPAROSCOPIC GASTRIC BAND REMOVAL WITH LAPAROSCOPIC GASTRIC SLEEVE RESECTION     MITRAL VALVE REPLACEMENT     TRICUSPID VALVE REPLACEMENT     VENTRAL HERNIA REPAIR      Prior to Admission medications   Medication Sig Start Date End Date Taking? Authorizing Provider  predniSONE (STERAPRED UNI-PAK 21 TAB) 10 MG (21) TBPK tablet Take 6 tablets the first day, take 5 tablets the second day, take 4 tablets the third day, take 3 tablets the fourth day, take 2 tablets the fifth day, take 1 tablet the sixth day. 08/21/21  Yes Vallarie Mare M, PA-C  albuterol (VENTOLIN HFA) 108 (90 Base) MCG/ACT inhaler Inhale 2 puffs into the lungs every 6 (six) hours as needed for wheezing or shortness of breath.    [provider]  aspirin EC 81 MG tablet Take 81 mg daily by mouth.    [provider]  candesartan (ATACAND) 8 MG tablet Take 8 mg by mouth at bedtime.  12/07/19   [provider]  carvedilol (COREG) 6.25 MG tablet Take 6.25 mg by mouth 2 (two) times daily. 12/07/19   [provider]  Cholecalciferol 25 MCG (1000 UT) tablet Take 1,000 Units by mouth daily.     [provider]  cyclobenzaprine (FLEXERIL) 10 MG tablet Take 10 mg by mouth 3 (three) times daily as needed for muscle spasms.  01/13/15   [provider]   diazepam (VALIUM) 5 MG tablet Take 5 mg by mouth every 8 (eight) hours as needed for anxiety or muscle spasms.     [provider]  docusate sodium (COLACE) 100 MG capsule Take 100 mg by mouth daily as needed for mild constipation.     [provider]  DULoxetine (CYMBALTA) 30 MG capsule Take 30 mg by mouth at bedtime.    [provider]  enoxaparin (LOVENOX) 120 MG/0.8ML injection Inject 120 mg into the skin in the morning and at bedtime. PT WAS INSTRUCTED TO STOP  HER ASPIRIN AND COUMADIN AND START LOVENOX BRIDGE IN PREPARATION FOR HER COLONOSCOPY ON 8-20 AND HER CHOLECYSTECTOMY ON 8-24 Patient not taking: Reported on 11/25/2020    [provider]  Flaxseed, Linseed, (FLAX SEED OIL PO) Take 1 tablet by mouth daily. Patient not taking: Reported on 11/25/2020    [provider]  fluticasone (FLONASE) 50 MCG/ACT nasal spray Place 2 sprays into both nostrils daily as needed for allergies or rhinitis.    [provider]  gabapentin (NEURONTIN) 300 MG capsule Take 1 capsule (300 mg total) by mouth at bedtime. 03/17/20 07/09/20  Milinda Pointer, MD  Galcanezumab-gnlm [facility]) Inject into the skin.    [provider]  Galcanezumab-gnlm 120 MG/ML SOSY Inject 120 mg into the skin every 30 (thirty) days.     [provider]  hydrocortisone 2.5 % cream Apply 1 application topically 2 (two) times a week.    [provider]  iron polysaccharides (NIFEREX) 150 MG capsule Take 150 mg by mouth daily.    [provider]  levalbuterol (XOPENEX) 1.25 MG/0.5ML nebulizer solution Take 1.25 mg by nebulization every 4 (four) hours as needed for wheezing or shortness of breath.    [provider]  levocetirizine (XYZAL) 5 MG tablet Take 5 mg by mouth every evening.  05/10/17   [provider]  linaclotide (LINZESS) 72 MCG capsule Take 72 mcg by mouth as needed.     [provider]  meclizine (ANTIVERT) 25  MG tablet Take 25 mg by mouth 3 (three) times daily as needed for dizziness.  05/11/15   [provider]  memantine (NAMENDA) 5 MG tablet Take 5 mg by mouth 2 (two) times daily.     [provider]  Minoxidil 5 % FOAM Apply 1 application topically daily.     [provider]  ondansetron (ZOFRAN) 4 MG tablet Take 1 tablet (4 mg total) by mouth daily as needed for nausea or vomiting. 07/03/20   Harvest Dark, MD  pantoprazole (PROTONIX) 40 MG tablet Take 40 mg by mouth 2 (two) times daily.  06/30/16   [provider]  pravastatin (PRAVACHOL) 20 MG tablet Take 20 mg by mouth at bedtime.  12/07/14   [provider]  spironolactone (ALDACTONE) 25 MG tablet Take 25 mg by mouth daily.     [provider]  warfarin (COUMADIN) 4 MG tablet Take 4 mg by mouth 3 (three) times a week.    [provider]  warfarin (COUMADIN) 7.5 MG tablet Take 1 tablet (7.5 mg total) one time only at 6 PM by mouth. 10/04/17   Bettey Costa, MD    Allergies Ivp dye [iodinated diagnostic agents], Sulfa antibiotics, Banana, Guaifenesin, Hctz [hydrochlorothiazide], Keflex [cephalexin], Losartan, Povidone iodine, Lisinopril, and Mucinex [guaifenesin er]  Family History  Problem Relation Age of Onset   Breast cancer Paternal Grandmother     Social History Social History   Tobacco Use   Smoking status: Every Day    Packs/day: 0.50    Years: 25.00    Pack years: 12.50    Types: Cigarettes   Smokeless tobacco: Never  Vaping Use   Vaping Use: Never used  Substance Use Topics   Alcohol use: No   Drug use: No     Review of Systems  Constitutional: No fever/chills Eyes:  No discharge ENT: No upper respiratory complaints. Respiratory: no cough. No SOB/ use of accessory muscles to breath Gastrointestinal:   No nausea, no vomiting.  No diarrhea.  No constipation. Musculoskeletal: Patient has right knee pain.  Skin: Negative for rash, abrasions, lacerations,  ecchymosis.    ____________________________________________   PHYSICAL EXAM:  VITAL SIGNS: ED Triage Vitals  Enc Vitals Group     BP 08/21/21 2022 (!) 154/88     Pulse Rate 08/21/21 2022 75     Resp 08/21/21 2022 20     Temp 08/21/21 2022 98.4 F (36.9 C)     Temp Source 08/21/21 2022 Oral     SpO2 08/21/21 2022 97 %     Weight 08/21/21 2019 227 lb 15.3 oz (103.4 kg)     Height 08/21/21 2019 5' 3"  (1.6 m)     Head Circumference --      Peak Flow --      Pain Score 08/21/21 2019 10     Pain Loc --      Pain Edu? --      Excl. in [place]? --      Constitutional: Alert and oriented. Well appearing and in no acute distress. Eyes: Conjunctivae are normal. PERRL. EOMI. Head: Atraumatic. ENT: Cardiovascular: Normal rate, regular rhythm. Normal S1 and S2.  Good peripheral circulation. Respiratory: Normal respiratory effort without tachypnea or retractions. Lungs CTAB. Good air entry to the bases with no decreased or absent breath sounds Gastrointestinal: Bowel sounds x 4 quadrants. Soft and nontender to palpation. No guarding or rigidity. No distention. Musculoskeletal: Patient has symmetric strength in the lower extremities.  Full range of motion to all extremities. No obvious deformities noted.  No loss of peripatellar dimpling on the right.  Patient does have a positive straight leg raise test on the right. Neurologic:  Normal for age. No gross focal neurologic deficits are appreciated.  Skin:  Skin is warm, dry and intact. No rash noted. Psychiatric: Mood and affect are normal for age. Speech and behavior are normal.   ____________________________________________   LABS (all labs ordered are listed, but only abnormal results are displayed)  Labs Reviewed - No data to display ____________________________________________  EKG   ____________________________________________  RADIOLOGY , Lannie Fields, personally viewed and evaluated these images (plain radiographs) as part  of my medical decision making, as well as reviewing the written report by the radiologist.  DG Knee Complete 4 Views Right  Result Date: 08/21/2021 CLINICAL DATA:  Right knee pain. EXAM: RIGHT KNEE - COMPLETE 4+ VIEW COMPARISON:  December 23, 2019 FINDINGS: No evidence of fracture, dislocation, or joint effusion. No evidence of arthropathy or other focal bone abnormality. Soft tissues are unremarkable. IMPRESSION: Negative. Electronically Signed   By: Virgina Norfolk M.D.   On: 08/21/2021 20:59    ____________________________________________    PROCEDURES  Procedure(s) performed:     Procedures     Medications  predniSONE (DELTASONE) tablet 60 mg (has no administration in time range)  HYDROcodone-acetaminophen (NORCO/VICODIN) 5-325 MG per tablet 1 tablet (has no administration in time range)     ____________________________________________   INITIAL IMPRESSION / ASSESSMENT AND PLAN / ED COURSE  Pertinent labs & imaging results that were available during my care of the patient were reviewed by me and considered in my medical decision making (see chart for details).       Assessment and plan Right knee pain 50 year old female presents to the emergency department with acute right knee pain that occasionally radiates along the lateral aspect of the right leg to the right ankle.  Patient was hypertensive at triage but vital signs were otherwise reassuring.  Patient did have  a positive straight leg raise on exam and I am suspicious that patient's right knee pain may stem from lumbar radiculopathy.  Patient did recently finish a 6-day taper of prednisone.  Given patient's complex medical history, will restart patient on prednisone until she can be seen by orthopedics.  Return precautions were given to return with new or worsening symptoms.  All patient questions were answered.    ____________________________________________  FINAL CLINICAL IMPRESSION(S) / ED  DIAGNOSES  Final diagnoses:  Acute pain of right knee      NEW MEDICATIONS STARTED DURING THIS VISIT:  ED Discharge Orders          Ordered    predniSONE (STERAPRED UNI-PAK 21 TAB) 10 MG (21) TBPK tablet        08/21/21 2142                This chart was dictated using voice recognition software/Dragon. Despite best efforts to proofread, errors can occur which can change the meaning. Any change was purely unintentional.     Lannie Fields, PA-C 08/21/21 2151    Nance Pear, MD 08/21/21 2216

## 2021-08-30 ENCOUNTER — Other Ambulatory Visit: Payer: Self-pay | Admitting: General Surgery

## 2021-09-15 ENCOUNTER — Other Ambulatory Visit (HOSPITAL_COMMUNITY): Payer: Self-pay | Admitting: Student

## 2021-09-15 ENCOUNTER — Other Ambulatory Visit: Payer: Self-pay | Admitting: Student

## 2021-09-15 DIAGNOSIS — R200 Anesthesia of skin: Secondary | ICD-10-CM

## 2021-09-22 ENCOUNTER — Other Ambulatory Visit: Payer: Self-pay

## 2021-09-22 ENCOUNTER — Ambulatory Visit
Admission: RE | Admit: 2021-09-22 | Discharge: 2021-09-22 | Disposition: A | Discharge status: Home / Self Care | Payer: 59 | Source: Ambulatory Visit | Attending: Student | Admitting: Student

## 2021-09-22 DIAGNOSIS — R200 Anesthesia of skin: Secondary | ICD-10-CM | POA: Insufficient documentation

## 2021-10-19 ENCOUNTER — Other Ambulatory Visit: Payer: Self-pay | Admitting: Neurology

## 2021-10-19 DIAGNOSIS — R42 Dizziness and giddiness: Secondary | ICD-10-CM

## 2021-10-29 ENCOUNTER — Other Ambulatory Visit: Payer: Self-pay | Admitting: General Surgery

## 2021-10-29 DIAGNOSIS — N6489 Other specified disorders of breast: Secondary | ICD-10-CM

## 2021-10-29 DIAGNOSIS — Z1231 Encounter for screening mammogram for malignant neoplasm of breast: Secondary | ICD-10-CM

## 2021-11-12 IMAGING — MR MR PELVIS W/O CM
6 of 9 series · 30 of 48 positions shown · non-contrast
Comparison: CT 04/03/2020, ultrasound 07/02/2020

CLINICAL DATA: Flank mass

EXAM:
MRI PELVIS WITHOUT CONTRAST
TECHNIQUE: Multiplanar multisequence MR imaging of the pelvis was performed. No
intravenous contrast was administered.

[Series 2: T2 · coronal · 5.0mm · 1.41mm/px · 6 of 37 slices shown (1 of 5)]
[im 1/37]
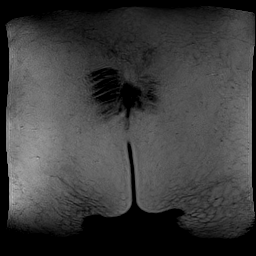
[im 8/37]
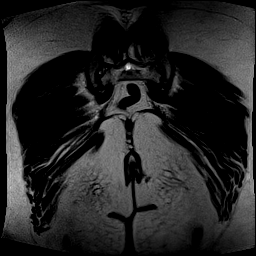
[im 15/37]
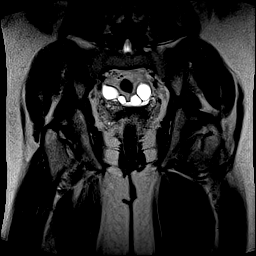
[im 22/37]
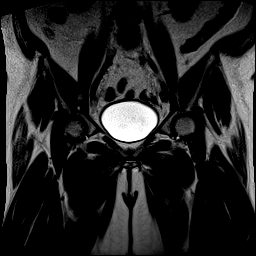
[im 29/37]
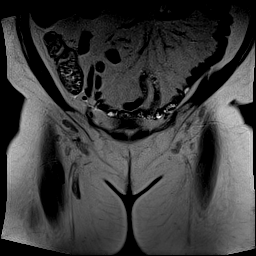
[im 37/37]
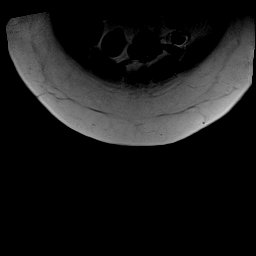

[Series 3: T2 · axial · 5.0mm · 0.50mm/px · z∈[-26,+172]mm · 5 of 34 slices shown (2 of 5)]
[im 1/34]
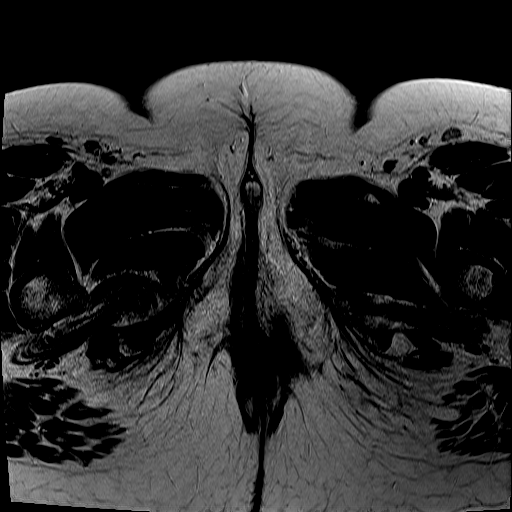
[im 9/34]
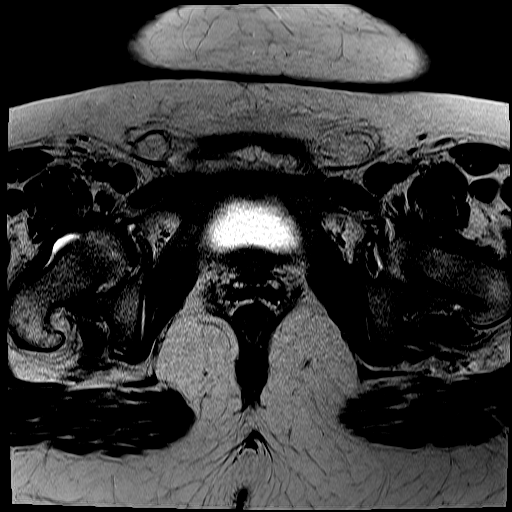
[im 17/34]
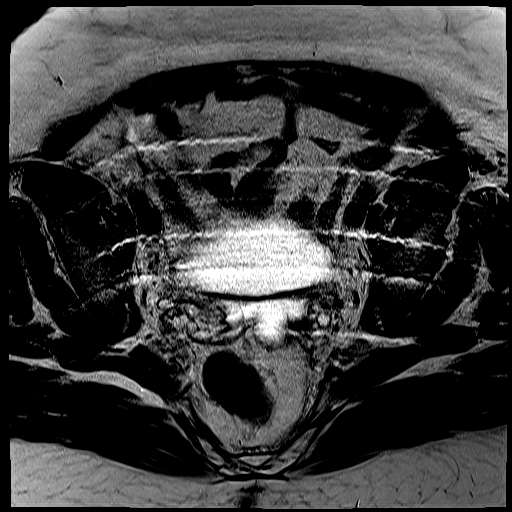
[im 25/34]
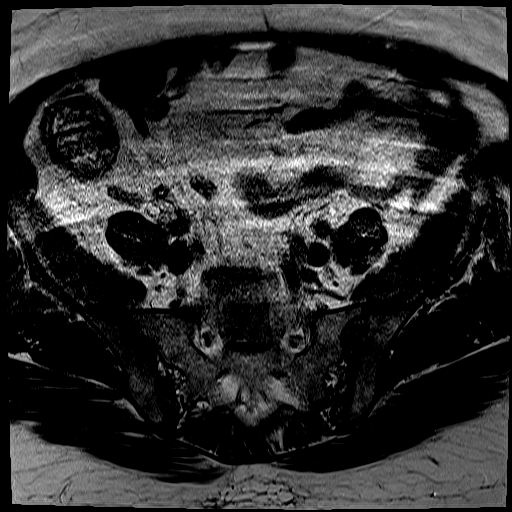
[im 34/34]
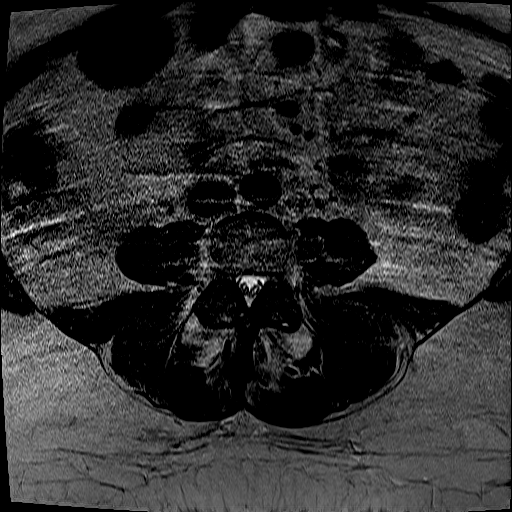

[Series 4: T2 · axial · 5.0mm · 0.50mm/px · z∈[-26,+172]mm · 4 of 34 slices shown (3 of 5)]
[im 1/34]
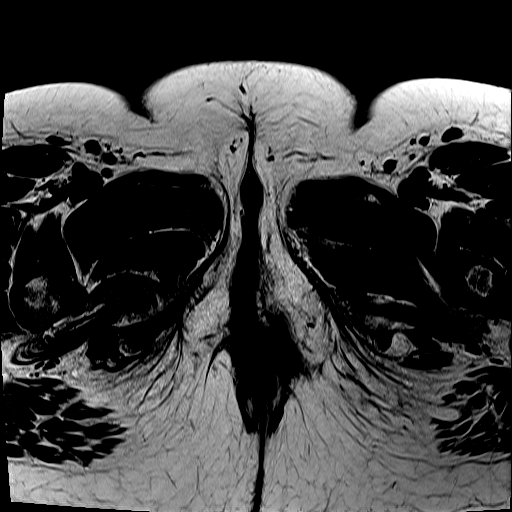
[im 12/34]
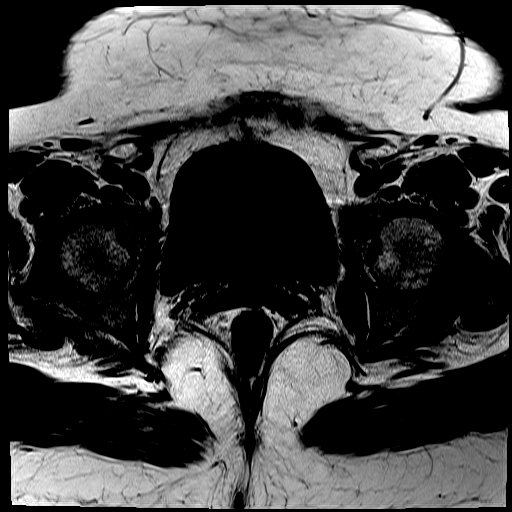
[im 23/34]
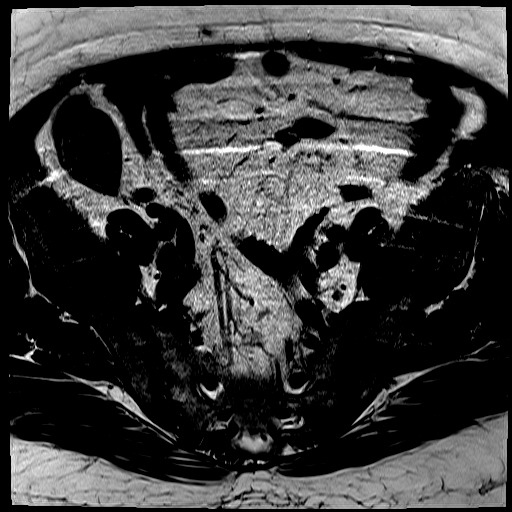
[im 34/34]
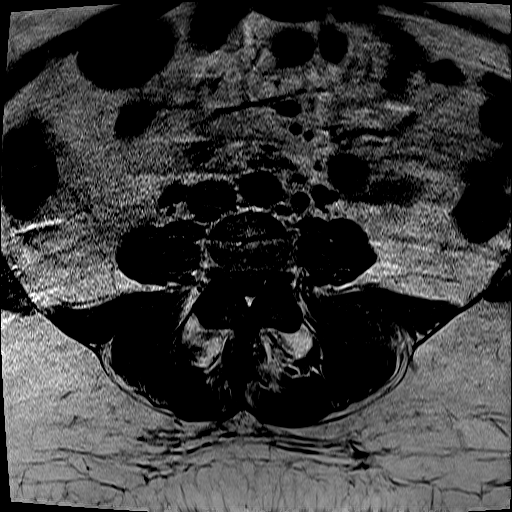

[Series 5: T2 · axial · 5.0mm · 0.50mm/px · z∈[-26,+172]mm · 4 of 34 slices shown (4 of 5)]
[im 1/34]
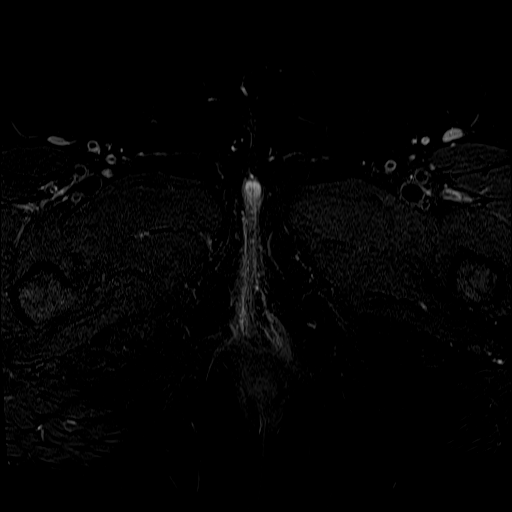
[im 12/34]
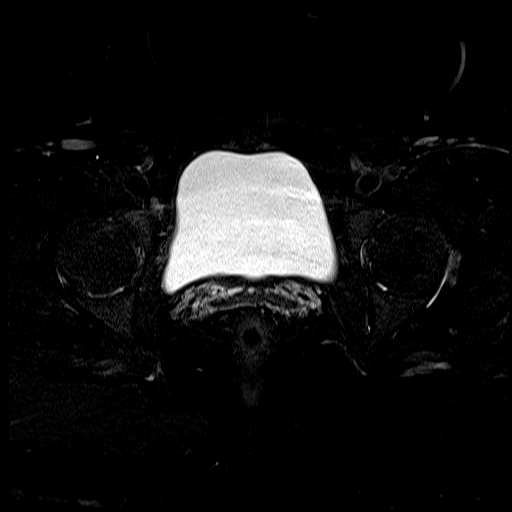
[im 23/34]
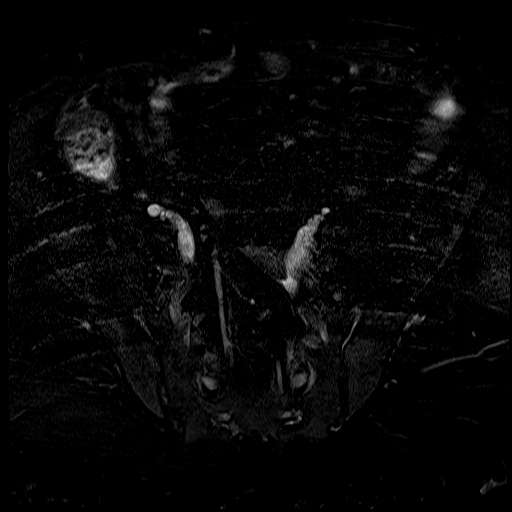
[im 34/34]
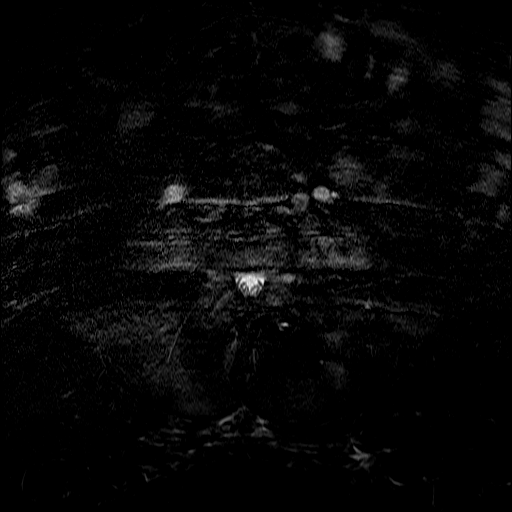

[Series 6: T2 · sagittal · 5.0mm · 0.75mm/px · 5 of 42 slices shown (5 of 5)]
[im 1/42]
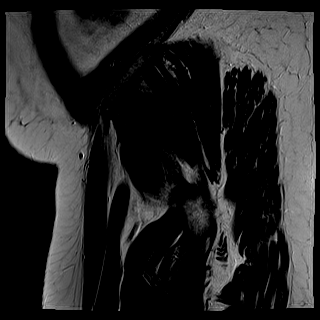
[im 11/42]
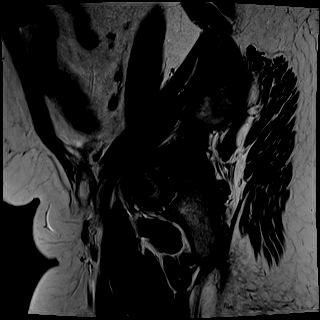
[im 21/42]
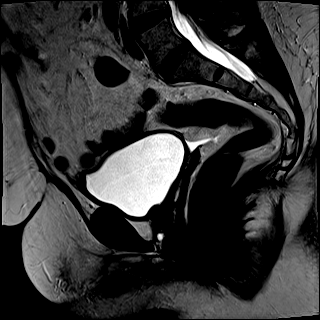
[im 31/42]
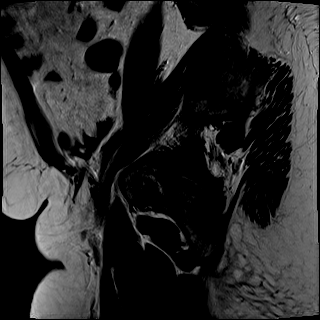
[im 42/42]
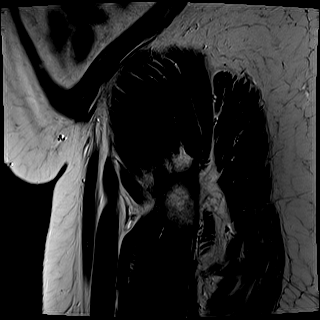

[Series 7: T1 dynamic · axial · 4.0mm · 0.77mm/px · z∈[-33,+171]mm · 6 of 52 slices shown]
[im 1/52]
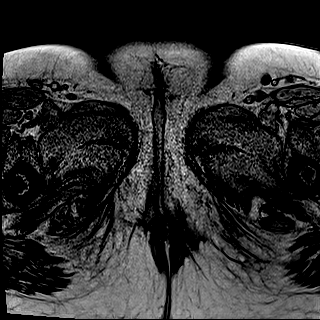
[im 11/52]
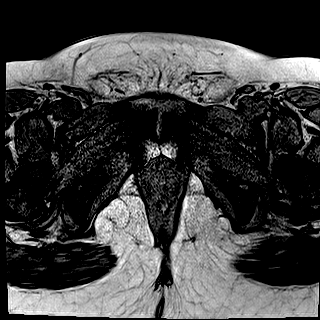
[im 21/52]
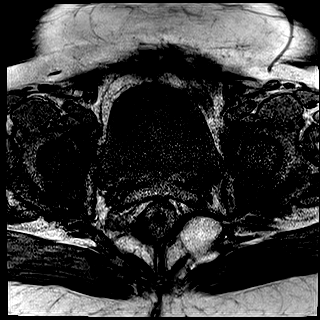
[im 31/52]
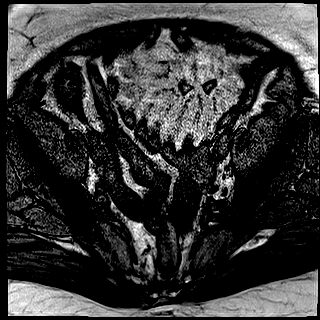
[im 41/52]
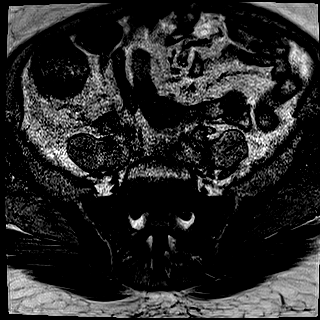
[im 52/52]
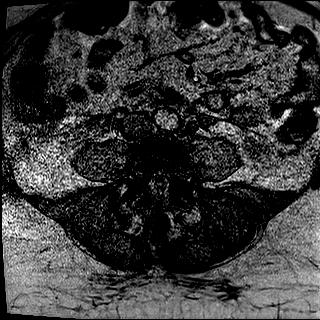

[30 of 48 positions shown; findings below may reference images not displayed]

FINDINGS: Urinary Tract:  No abnormality visualized.

Bowel:  Unremarkable visualized pelvic bowel loops.

Vascular/Lymphatic: No pathologically enlarged lymph nodes. No
significant vascular abnormality seen.

Reproductive: Status post hysterectomy. T2 bright, T1 hypointense
tubular structures within the pelvis felt consistent with bilateral
hydrosalpinx. Tube on the left is dilated up to 2.7 cm transverse
and tube on the right is dilated up to 1.7 cm. Findings are not
significantly changed as compared with CT from 04/03/2020. No
adnexal mass lesion is visualized.

Other:  No pelvic effusion.

Musculoskeletal: No suspicious bone lesions identified. Trace right
hip effusion.
IMPRESSION: 1. Status post hysterectomy.
2. Fluid intensity tubular structures within the bilateral pelvis,
felt consistent with bilateral hydrosalpinx. Size and appearance
does not appear significantly changed in comparison to CT from [DATE]

## 2021-11-15 ENCOUNTER — Inpatient Hospital Stay: Admission: RE | Admit: 2021-11-15 | Payer: 59 | Source: Ambulatory Visit

## 2021-11-23 ENCOUNTER — Other Ambulatory Visit: Payer: 59

## 2021-12-13 ENCOUNTER — Other Ambulatory Visit: Payer: 59

## 2021-12-14 ENCOUNTER — Other Ambulatory Visit: Payer: Self-pay

## 2021-12-14 ENCOUNTER — Ambulatory Visit
Admission: RE | Admit: 2021-12-14 | Discharge: 2021-12-14 | Disposition: A | Discharge status: Home / Self Care | Payer: 59 | Source: Ambulatory Visit | Attending: General Surgery | Admitting: General Surgery

## 2021-12-14 DIAGNOSIS — N6489 Other specified disorders of breast: Secondary | ICD-10-CM | POA: Diagnosis present

## 2021-12-14 DIAGNOSIS — Z1231 Encounter for screening mammogram for malignant neoplasm of breast: Secondary | ICD-10-CM | POA: Insufficient documentation

## 2021-12-24 ENCOUNTER — Other Ambulatory Visit: Payer: Self-pay | Admitting: Neurology

## 2021-12-24 DIAGNOSIS — R42 Dizziness and giddiness: Secondary | ICD-10-CM

## 2021-12-27 ENCOUNTER — Other Ambulatory Visit: Payer: Self-pay | Admitting: Student

## 2021-12-27 DIAGNOSIS — R200 Anesthesia of skin: Secondary | ICD-10-CM

## 2021-12-27 DIAGNOSIS — R42 Dizziness and giddiness: Secondary | ICD-10-CM

## 2022-01-09 ENCOUNTER — Ambulatory Visit
Admission: RE | Admit: 2022-01-09 | Discharge: 2022-01-09 | Disposition: A | Discharge status: Home / Self Care | Payer: 59 | Source: Ambulatory Visit | Attending: Student | Admitting: Student

## 2022-01-09 ENCOUNTER — Other Ambulatory Visit: Payer: Self-pay

## 2022-01-09 ENCOUNTER — Ambulatory Visit
Admission: RE | Admit: 2022-01-09 | Discharge: 2022-01-09 | Disposition: A | Discharge status: Home / Self Care | Payer: 59 | Source: Ambulatory Visit | Attending: Neurology | Admitting: Neurology

## 2022-01-09 DIAGNOSIS — R42 Dizziness and giddiness: Secondary | ICD-10-CM

## 2022-01-09 DIAGNOSIS — R200 Anesthesia of skin: Secondary | ICD-10-CM

## 2022-01-27 ENCOUNTER — Other Ambulatory Visit: Payer: Self-pay | Admitting: *Deleted

## 2022-01-27 DIAGNOSIS — F17210 Nicotine dependence, cigarettes, uncomplicated: Secondary | ICD-10-CM

## 2022-01-27 DIAGNOSIS — Z87891 Personal history of nicotine dependence: Secondary | ICD-10-CM

## 2022-02-08 ENCOUNTER — Ambulatory Visit (INDEPENDENT_AMBULATORY_CARE_PROVIDER_SITE_OTHER): Payer: 59 | Admitting: Acute Care

## 2022-02-08 ENCOUNTER — Encounter: Payer: Self-pay | Admitting: Acute Care

## 2022-02-08 ENCOUNTER — Other Ambulatory Visit: Payer: Self-pay

## 2022-02-08 DIAGNOSIS — F17210 Nicotine dependence, cigarettes, uncomplicated: Secondary | ICD-10-CM | POA: Diagnosis not present

## 2022-02-08 NOTE — Patient Instructions (Signed)
Thank you for participating in the [facility]. °It was our pleasure to meet you today. °We will call you with the results of your scan within the next few days. °Your scan will be assigned a Lung RADS category score by the physicians reading the scans.  °This Lung RADS score determines follow up scanning.  °See below for description of categories, and follow up screening recommendations. °We will be in touch to schedule your follow up screening annually or based on recommendations of our providers. °We will fax a copy of your scan results to your Primary Care Physician, or the physician who referred you to the program, to ensure they have the results. °Please call the office if you have any questions or concerns regarding your scanning experience or results.  °Our office number is [phone]. °Please speak with Denise Phelps, RN. [name] our Lung Cancer Screening RN. °If she is unavailable when you call, please have the office staff send her a message. She will return your call at her earliest convenience. °Remember, if your scan is normal, we will scan you annually as long as you continue to meet the criteria for the program. (Age 55-77, Current smoker or smoker who has quit within the last 15 years). °If you are a smoker, remember, quitting is the single most powerful action that you can take to decrease your risk of lung cancer and other pulmonary, breathing related problems. °We know quitting is hard, and we are here to help.  °Please let us know if there is anything we can do to help you meet your goal of quitting. °If you are a former smoker, congratulations. We are proud of you! Remain smoke free! °Remember you can refer friends or family members through the number above.  °We will screen them to make sure they meet criteria for the program. °Thank you for helping us take better care of you by participating in Lung Screening. ° °You can receive free nicotine replacement therapy  ( patches, gum or mints) by calling 1-800-QUIT NOW. Please call so we can get you on the path to becoming  a non-smoker. I know it is hard, but you can do this! ° °Lung RADS Categories: ° °Lung RADS 1: no nodules or definitely non-concerning nodules.  °Recommendation is for a repeat annual scan in 12 months. ° °Lung RADS 2:  nodules that are non-concerning in appearance and behavior with a very low likelihood of becoming an active cancer. °Recommendation is for a repeat annual scan in 12 months. ° °Lung RADS 3: nodules that are probably non-concerning , includes nodules with a low likelihood of becoming an active cancer.  Recommendation is for a 6-month repeat screening scan. Often noted after an upper respiratory illness. We will be in touch to make sure you have no questions, and to schedule your 6-month scan. ° °Lung RADS 4 A: nodules with concerning findings, recommendation is most often for a follow up scan in 3 months or additional testing based on our provider's assessment of the scan. We will be in touch to make sure you have no questions and to schedule the recommended 3 month follow up scan. ° °Lung RADS 4 B:  indicates findings that are concerning. We will be in touch with you to schedule additional diagnostic testing based on our provider's  assessment of the scan. ° °Hypnosis for smoking cessation  °[employer] °[phone] ° °Acupuncture for smoking cessation  °[facility] °[phone]  °

## 2022-02-08 NOTE — Progress Notes (Signed)
Virtual Visit via Telephone Note ? ?I connected with Charlestine Massed on 02/08/22 Shelly 10:00 AM EDT by telephone and verified that I am speaking with the correct person using two identifiers. ? ?Location: ?Patient:  Shelly Huynh ?Provider: [facility], [place], [place], [address]  ?  ?I discussed the limitations, risks, security and privacy concerns of performing an evaluation and management service by telephone and the availability of in person appointments. I also discussed with the patient that there may be a patient responsible charge related to this service. The patient expressed understanding and agreed to proceed. ? ? ? ?Shared Decision Making Visit Lung Cancer Screening Program ?[phone]) ? ? ?Eligibility: ?Age 51 y.o. ?Pack Years Smoking History Calculation 25 pack year smoking history ?(# packs/per year x # years smoked) ?Recent History of coughing up blood  no ?Unexplained weight loss? no ?( >Than 15 pounds within the last 6 months ) ?Prior History Lung / other cancer no ?(Diagnosis within the last 5 years already requiring surveillance chest CT Scans). ?Smoking Status Current Smoker ?Former Smokers: Years since quit:  NA ? Quit Date:  NA ? ?Visit Components: ?Discussion included one or more decision making aids. yes ?Discussion included risk/benefits of screening. yes ?Discussion included potential follow up diagnostic testing for abnormal scans. yes ?Discussion included meaning and risk of over diagnosis. yes ?Discussion included meaning and risk of False Positives. yes ?Discussion included meaning of total radiation exposure. yes ? ?Counseling Included: ?Importance of adherence to annual lung cancer LDCT screening. yes ?Impact of comorbidities on ability to participate in the program. yes ?Ability and willingness to under diagnostic treatment. yes ? ?Smoking Cessation Counseling: ?Current Smokers:  ?Discussed importance of smoking cessation. yes ?Information about tobacco cessation classes and  interventions provided to patient. yes ?Patient provided with "ticket" for LDCT Scan. yes ?Symptomatic Patient. no ? Counseling NA ?Diagnosis Code: Tobacco Use Z72.0 ?Asymptomatic Patient yes ? Counseling (Intermediate counseling: > three minutes counseling) C7893 ?Former Smokers:  ?Discussed the importance of maintaining cigarette abstinence. yes ?Diagnosis Code: Personal History of Nicotine Dependence. Y10.175 ?Information about tobacco cessation classes and interventions provided to patient. Yes ?Patient provided with "ticket" for LDCT Scan. yes ?Written Order for Lung Cancer Screening with LDCT placed in Epic. Yes ?(CT Chest Lung Cancer Screening Low Dose W/O CM) [id number] ?Z12.2-Screening of respiratory organs ?Z87.891-Personal history of nicotine dependence ? ?I have spent 25 minutes of face to face/ virtual visit   time with  Ms. Pressley discussing the risks and benefits of lung cancer screening. We viewed / discussed a power point together that explained in detail the above noted topics. We paused Shelly intervals to allow for questions to be asked and answered to ensure understanding.We discussed that the single most powerful action that she can take to decrease her risk of developing lung cancer is to quit smoking. We discussed whether or not she is ready to commit to setting a quit date. We discussed options for tools to aid in quitting smoking including nicotine replacement therapy, non-nicotine medications, support groups, Quit Smart classes, and behavior modification. We discussed that often times setting smaller, more achievable goals, such as eliminating 1 cigarette a day for a week and then 2 cigarettes a day for a week can be helpful in slowly decreasing the number of cigarettes smoked. This allows for a sense of accomplishment as well as providing a clinical benefit. I provided  her  with smoking cessation  information  with contact information for community resources, classes,  free nicotine replacement  therapy, and access to mobile apps, text messaging, and on-line smoking cessation help. I have also provided  her  the office contact information in the event she needs to contact me, or the screening staff. We discussed the time and location of the scan, and that either Doroteo Glassman RN, Joella Prince, RN  or I will call / send a letter with the results within 24-72 hours of receiving them. The patient verbalized understanding of all of  the above and had no further questions upon leaving the office. They have my contact information in the event they have any further questions. ? ?I spent 4 minutes counseling on smoking cessation and the health risks of continued tobacco abuse. ? ?I explained to the patient that there has been a high incidence of coronary artery disease noted on these exams. I explained that this is a non-gated exam therefore degree or severity cannot be determined. This patient is on statin therapy. I have asked the patient to follow-up with their PCP regarding any incidental finding of coronary artery disease and management with diet or medication as their PCP  feels is clinically indicated. The patient verbalized understanding of the above and had no further questions upon completion of the visit. ? ?  ? ? ?Magdalen Spatz, NP ?02/08/2022 ? ? ? ? ? ? ?

## 2022-02-10 ENCOUNTER — Ambulatory Visit (INDEPENDENT_AMBULATORY_CARE_PROVIDER_SITE_OTHER)
Admission: RE | Admit: 2022-02-10 | Discharge: 2022-02-10 | Disposition: A | Discharge status: Home / Self Care | Payer: 59 | Source: Ambulatory Visit | Attending: Acute Care | Admitting: Acute Care

## 2022-02-10 ENCOUNTER — Other Ambulatory Visit: Payer: Self-pay

## 2022-02-10 DIAGNOSIS — Z87891 Personal history of nicotine dependence: Secondary | ICD-10-CM

## 2022-02-10 DIAGNOSIS — F17210 Nicotine dependence, cigarettes, uncomplicated: Secondary | ICD-10-CM | POA: Diagnosis not present

## 2022-02-14 ENCOUNTER — Other Ambulatory Visit: Payer: Self-pay | Admitting: Acute Care

## 2022-02-14 DIAGNOSIS — F17210 Nicotine dependence, cigarettes, uncomplicated: Secondary | ICD-10-CM

## 2022-02-14 DIAGNOSIS — Z87891 Personal history of nicotine dependence: Secondary | ICD-10-CM

## 2022-02-15 NOTE — Telephone Encounter (Signed)
Sarah, please advise on pt's email regarding the LDCT. Thanks.  ?

## 2022-02-16 ENCOUNTER — Telehealth: Payer: Self-pay | Admitting: Acute Care

## 2022-02-16 NOTE — Telephone Encounter (Signed)
I called [name] at her request, patient sent an advice request via email, to go over the results of her low-dose CT.  The scan was done March 23, read by radiology March 25, a letter was sent on March 27.  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  I believe the patient was calling for additional information.  I have asked her to call [phone] if she needs further explanation of the results of her scan. ?Shelly Huynh if patient calls the screening line and needs further explanation please let me know and I will be happy to try calling her again.  Thanks so much ?

## 2022-04-26 ENCOUNTER — Other Ambulatory Visit: Payer: Self-pay | Admitting: Ophthalmology

## 2022-04-26 DIAGNOSIS — G453 Amaurosis fugax: Secondary | ICD-10-CM

## 2022-04-26 DIAGNOSIS — H3401 Transient retinal artery occlusion, right eye: Secondary | ICD-10-CM

## 2022-05-06 ENCOUNTER — Ambulatory Visit
Admission: RE | Admit: 2022-05-06 | Discharge: 2022-05-06 | Disposition: A | Discharge status: Home / Self Care | Payer: 59 | Source: Ambulatory Visit | Attending: Ophthalmology | Admitting: Ophthalmology

## 2022-05-06 DIAGNOSIS — H3401 Transient retinal artery occlusion, right eye: Secondary | ICD-10-CM | POA: Insufficient documentation

## 2022-05-06 DIAGNOSIS — G453 Amaurosis fugax: Secondary | ICD-10-CM | POA: Diagnosis present

## 2022-06-02 ENCOUNTER — Ambulatory Visit: Payer: 59 | Admitting: Student in an Organized Health Care Education/Training Program

## 2022-06-06 ENCOUNTER — Encounter: Payer: Self-pay | Admitting: *Deleted

## 2022-06-06 ENCOUNTER — Emergency Department: Payer: 59

## 2022-06-06 ENCOUNTER — Other Ambulatory Visit: Payer: Self-pay

## 2022-06-06 DIAGNOSIS — R519 Headache, unspecified: Secondary | ICD-10-CM | POA: Diagnosis present

## 2022-06-06 DIAGNOSIS — R110 Nausea: Secondary | ICD-10-CM | POA: Diagnosis not present

## 2022-06-06 DIAGNOSIS — Z5321 Procedure and treatment not carried out due to patient leaving prior to being seen by health care provider: Secondary | ICD-10-CM | POA: Insufficient documentation

## 2022-06-06 DIAGNOSIS — Z7901 Long term (current) use of anticoagulants: Secondary | ICD-10-CM | POA: Diagnosis not present

## 2022-06-06 DIAGNOSIS — Y92002 Bathroom of unspecified non-institutional (private) residence single-family (private) house as the place of occurrence of the external cause: Secondary | ICD-10-CM | POA: Insufficient documentation

## 2022-06-06 LAB — BASIC METABOLIC PANEL
Anion gap: 8 (ref 5–15)
BUN: 9 mg/dL (ref 6–20)
CO2: 22 mmol/L (ref 22–32)
Calcium: 9.4 mg/dL (ref 8.9–10.3)
Chloride: 111 mmol/L (ref 98–111)
Creatinine, Ser: 0.91 mg/dL (ref 0.44–1.00)
GFR, Estimated: >60 mL/min (ref >60)
Glucose, Bld: 101 mg/dL — ABNORMAL HIGH (ref 70–99)
Potassium: 3.5 mmol/L (ref 3.5–5.1)
Sodium: 141 mmol/L (ref 135–145)

## 2022-06-06 LAB — CBC
HCT: 41.7 % (ref 36.0–46.0)
Hemoglobin: 12.8 g/dL (ref 12.0–15.0)
MCH: 29.1 pg (ref 26.0–34.0)
MCHC: 30.7 g/dL (ref 30.0–36.0)
MCV: 94.8 fL (ref 80.0–100.0)
Platelets: 261 10*3/uL (ref 150–400)
RBC: 4.4 MIL/uL (ref 3.87–5.11)
RDW: 16.1 % — ABNORMAL HIGH (ref 11.5–15.5)
WBC: 7.3 10*3/uL (ref 4.0–10.5)
nRBC: 0 % (ref 0.0–0.2)

## 2022-06-06 LAB — PROTIME-INR
INR: 2.5 — ABNORMAL HIGH (ref 0.8–1.2)
Prothrombin Time: 26.5 seconds — ABNORMAL HIGH (ref 11.4–15.2)

## 2022-06-06 NOTE — ED Triage Notes (Signed)
Pt fell last night going into the bathroom last night.  Pt struck head on the door way.  No loc.  Pt states her leg  gave away last night.  Pt reports a headache.  Pt has nausea.  Pt is on warfarin.  Pt alert  speech clear.

## 2022-06-07 ENCOUNTER — Emergency Department
Admission: EM | Admit: 2022-06-07 | Discharge: 2022-06-07 | Discharge status: Against Medical Advice | Payer: 59 | Attending: Emergency Medicine | Admitting: Emergency Medicine

## 2022-07-05 ENCOUNTER — Ambulatory Visit
Payer: 59 | Attending: Student in an Organized Health Care Education/Training Program | Admitting: Student in an Organized Health Care Education/Training Program

## 2022-07-05 ENCOUNTER — Encounter: Payer: Self-pay | Admitting: Student in an Organized Health Care Education/Training Program

## 2022-07-05 VITALS — BP 121/79 | HR 98 | Temp 97.2°F | Resp 16 | Ht 63.0 in | Wt 237.3 lb

## 2022-07-05 DIAGNOSIS — M47817 Spondylosis without myelopathy or radiculopathy, lumbosacral region: Secondary | ICD-10-CM | POA: Diagnosis present

## 2022-07-05 DIAGNOSIS — M160 Bilateral primary osteoarthritis of hip: Secondary | ICD-10-CM | POA: Diagnosis present

## 2022-07-05 DIAGNOSIS — M792 Neuralgia and neuritis, unspecified: Secondary | ICD-10-CM

## 2022-07-05 DIAGNOSIS — M47816 Spondylosis without myelopathy or radiculopathy, lumbar region: Secondary | ICD-10-CM | POA: Diagnosis present

## 2022-07-05 MED ORDER — DULOXETINE HCL 30 MG PO CPEP: 30.0000 mg | ORAL_CAPSULE | Freq: Every day | ORAL | 2 refills | Status: DC

## 2022-07-05 NOTE — Progress Notes (Signed)
Safety precautions to be maintained throughout the outpatient stay will include: orient to surroundings, keep bed in low position, maintain call bell within reach at all times, provide assistance with transfer out of bed and ambulation.  

## 2022-07-05 NOTE — Progress Notes (Signed)
Patient: Shelly Huynh  Service Category: E/M  Provider: Gillis Santa, MD  DOB: [birth year]  DOS: 07/05/2022  Referring Provider: Sharlet Salina, MD  MRN: [id number]  Setting: Ambulatory outpatient  PCP: Tracie Harrier, MD  Type: New Patient  Specialty: Interventional Pain Management    Location: Office  Delivery: Face-to-face     Primary Reason(s) for Visit: Encounter for initial evaluation of one or more chronic problems (new to examiner) potentially causing chronic pain, and posing a threat to normal musculoskeletal function. (Level of risk: High) CC: Back Pain (lower), Neck Pain, and Pain (migraines)  HPI  Shelly Huynh is a 51 y.o. year old, female patient, who comes for the first time to our practice referred by Sharlet Salina, MD for our initial evaluation of her chronic pain. 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; Compulsive tobacco user syndrome; Bilateral leg edema; Chronic anticoagulation (Coumadin); Chronic hypokalemia; Coronary artery disease due to calcified coronary lesion; Diabetes mellitus type 2, diet-controlled ([place]); Enlarged heart; Gastroesophageal reflux disease without esophagitis; Osteoarthritis of spine with radiculopathy, lumbar region; Palpitations; Spondylosis of lumbar region without myelopathy or radiculopathy; Tobacco abuse; Vitamin D deficiency; Pleural effusion; HCAP (healthcare-associated pneumonia); On anticoagulant therapy; Positive Helicobacter pylori serology;  Anxiety state; Asthma; Bilateral hand numbness; Cervical radiculopathy; Chest pain on breathing; Diabetes mellitus ([place]); Cirrhosis of liver without ascites ([place]); Elevated hemoglobin A1c; GERD with esophagitis; Hyperaldosteronism ([place]); Hypertension; Hypertension due to endocrine disorder; Hypertension, well controlled; Irritable bowel syndrome with constipation; Lumbar radicular pain; Aortic valve disease, rheumatic; Cervicalgia; Presence of prosthetic heart valve; SOBOE (shortness of breath on exertion); Status post bariatric surgery; Tricuspid valve disorder; 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[place]); BMI 40.0-44.9, adult ([place]); Hypercholesterolemia; Nicotine dependence, uncomplicated; H/O mechanical aortic valve replacement; Obstructive sleep apnea syndrome; Chronic low back pain (1ry area of Pain) (Bilateral) (L>R) w/o sciatica; Chronic lower extremity pain (2ry area of Pain) (Bilateral) (L>R); Chronic hip pain (3ry area of Pain) (Bilateral) (L>R); Chronic knee pain (4th area of Pain) (Bilateral) (L>R); Chronic recurrent occipital headache (Bilateral); Cervicogenic headache (6th area of Pain) (Bilateral); Chronic neck pain (5th area of Pain) (Bilateral) (L>R); Chronic shoulder pain (Bilateral) (L>R); Chronic upper extremity pain (7th area of Pain) (Bilateral) (R>L); Chronic pain syndrome; Pharmacologic therapy; Disorder of skeletal system; Problems influencing health status; Abnormal MRI, cervical spine (09/06/2019); Cervical central spinal stenosis; Cervical foraminal stenosis (C2-3, C4-5) (Left); Abnormal MRI, lumbar spine (08/24/2015); Lumbar facet hypertrophy (L4-5 and L5-S1) (Bilateral); Lumbar facet syndrome (Bilateral) (L>R); DDD (degenerative disc disease), lumbosacral; History of Allergy to iodine; History of allergy to radiographic contrast media; Chronic prescription opiate use; Neurogenic pain; Lumbosacral L5-S1 facet arthritis (Severe) (Bilateral); Osteoarthritis of hips (Bilateral); Refusal of blood  transfusions as patient is Jehovah's Witness; Mitral valve disease, rheumatic; and History of tricuspid valve replacement with bioprosthetic valve on their problem list. Today she comes in for evaluation of her Back Pain (lower), Neck Pain, and Pain (migraines)  Pain Assessment: Location: Lower Back Radiating: hips to legs to feet; sometimes feet go numb Onset: More than a month ago Duration: Chronic pain Quality: Numbness, Other (Comment), Spasm, Aching, Sharp, Shooting, Constant, Dull (weakness) Severity: 8 /  10 (subjective, self-reported pain score)  Effect on ADL: weak and numb legs cause falls, "I hurt from my head to my toes" Timing: Constant Modifying factors: injections has helped temporarily, exercise BP: 121/79  HR: 98  Onset and Duration: Gradual Cause of pain: Work related accident or event Severity: Getting worse, NAS-11 at its worse: 10/10, NAS-11 at its best: 8/10, NAS-11 now: 8/10, and NAS-11 on the average: 8/10 Timing: Not influenced by the time of the day and After activity or exercise Aggravating Factors: Bending, Climbing, Kneeling, Lifiting, Prolonged sitting, Prolonged standing, Squatting, Stooping , Twisting, Walking, Walking uphill, Walking downhill, and Working Alleviating Factors: Stretching, Cold packs, Hot packs, Medications, Resting, Sitting, and Warm showers or baths Associated Problems: Day-time cramps, Night-time cramps, Depression, Dizziness, Fatigue, Inability to concentrate, Nausea, Numbness, Sadness, Spasms, Sweating, Swelling, Weakness, Pain that wakes patient up, and Pain that does not allow patient to sleep Quality of Pain: Aching, Annoying, Constant, Deep, Dull, Heavy, Hot, Itching, Nagging, Pressure-like, Sharp, Shooting, Tender, Throbbing, Tingling, Tiring, Toothache-like, and Uncomfortable Previous Examinations or Tests: Ct-Myelogram Previous Treatments: Epidural steroid injections and Narcotic medications  Shelly Huynh is being referred from Dr.  Sharlet Salina for consideration of possible spinal cord stimulator trial for chronic low back and leg pain.  Current leg pain radiation is primarily into her posterior thighs, calves and usually to the dorsum of her feet.  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  She has been seen by my partner, Dr. Dossie Arbour in the past.  She has had a hip joint injections as well as medial branch nerve blocks.  Insurance denied RFA.  She also has neck pain that radiates into her temporal region and history of migraines.  She has done physical therapy with limited response.  She has tried gabapentin in the past along with Cymbalta, Flexeril, prednisone taper with limited response.  She has been evaluated by neurosurgery in 2020 where Dr. Cari Caraway recommended conservative treatment.  She had a EMG study that was consistent with generalized severe sensorimotor polyneuropathy.  [name] being referred here by Dr. Sharlet Salina for possible spinal cord stimulator trial.  Of note patient does have a history of three-vessel coronary artery disease with mitral valve replacement.  [name] chronically anticoagulated with warfarin with an INR goal of 2.5-3.5.   Procedures Hx:  10/19/2021: Left hip joint injection (90% relief) 09/03/2021: RFA to the bilateral L4-5 and L5-S1 facet joints (mild relief) 11/04/2020: MBB to the bilateral L4-5 and L5-S1 facet joints (8/10 to 0/10) 09/08/2020: MBB to the bilateral L4-5 and L5-S1 facet joints (8/10 to 0/10)    Historic Controlled Substance Pharmacotherapy Review   Historical Monitoring: The patient  reports no history of drug use. List of prior UDS Testing: Lab Results  Component Value Date   MDMA NONE DETECTED 12/23/2019   COCAINSCRNUR NONE DETECTED 12/23/2019   PCPSCRNUR NONE DETECTED 12/23/2019   THCU NONE DETECTED 12/23/2019   Historical Background Evaluation: [place] PMP: PDMP not reviewed this encounter. Review of  the past 15-month conducted.              [place] Department of public safety, offender search: (Editor, commissioningInformation) Non-contributory Risk Assessment Profile: Aberrant behavior: None observed or detected today Risk factors for fatal opioid overdose: None identified today Fatal overdose hazard ratio (HR): Calculation deferred Non-fatal overdose hazard ratio (HR): Calculation deferred Risk of opioid abuse or dependence: 0.7-3.0% with doses ? 36 MME/day and 6.1-26%  with doses ? 120 MME/day. Substance use disorder (SUD) risk level: See below   Pharmacologic Plan: No opioid analgesics.            Initial impression: Poor candidate for opioid analgesics.  Evaluation for spinal cord stimulator trial only.  Meds   Current Outpatient Medications:    albuterol (VENTOLIN HFA) 108 (90 Base) MCG/ACT inhaler, Inhale 2 puffs into the lungs every 6 (six) hours as needed for wheezing or shortness of breath., Disp: , Rfl:    aspirin EC 81 MG tablet, Take 81 mg daily by mouth., Disp: , Rfl:    candesartan (ATACAND) 8 MG tablet, Take 8 mg by mouth at bedtime. , Disp: , Rfl:    Cholecalciferol 25 MCG (1000 UT) tablet, Take 1,000 Units by mouth daily. , Disp: , Rfl:    cyclobenzaprine (FLEXERIL) 10 MG tablet, Take 10 mg by mouth 3 (three) times daily as needed for muscle spasms. , Disp: , Rfl:    diazepam (VALIUM) 5 MG tablet, Take 5 mg by mouth every 8 (eight) hours as needed for anxiety or muscle spasms. , Disp: , Rfl:    docusate sodium (COLACE) 100 MG capsule, Take 100 mg by mouth daily as needed for mild constipation. , Disp: , Rfl:    enoxaparin (LOVENOX) 120 MG/0.8ML injection, Inject 120 mg into the skin in the morning and at bedtime. PT WAS INSTRUCTED TO STOP HER ASPIRIN AND COUMADIN AND START LOVENOX BRIDGE IN PREPARATION FOR HER COLONOSCOPY ON 8-20 AND HER CHOLECYSTECTOMY ON 8-24, Disp: , Rfl:    Flaxseed, Linseed, (FLAX SEED OIL PO), Take 1 tablet by mouth daily., Disp: , Rfl:    fluticasone (FLONASE)  50 MCG/ACT nasal spray, Place 2 sprays into both nostrils daily as needed for allergies or rhinitis., Disp: , Rfl:    gabapentin (NEURONTIN) 300 MG capsule, Take 1 capsule (300 mg total) by mouth at bedtime. (Patient taking differently: Take 300 mg by mouth 2 (two) times daily.), Disp: 90 capsule, Rfl: 0   Galcanezumab-gnlm (EMGALITY [place]), Inject into the skin., Disp: , Rfl:    HYDROcodone-acetaminophen (NORCO/VICODIN) 5-325 MG tablet, Take 1 tablet by mouth every 6 (six) hours as needed for moderate pain., Disp: , Rfl:    hydrocortisone 2.5 % cream, Apply 1 application topically 2 (two) times a week., Disp: , Rfl:    iron polysaccharides (NIFEREX) 150 MG capsule, Take 150 mg by mouth daily., Disp: , Rfl:    levalbuterol (XOPENEX) 1.25 MG/0.5ML nebulizer solution, Take 1.25 mg by nebulization every 4 (four) hours as needed for wheezing or shortness of breath., Disp: , Rfl:    levocetirizine (XYZAL) 5 MG tablet, Take 5 mg by mouth every evening. , Disp: , Rfl:    linaclotide (LINZESS) 72 MCG capsule, Take 72 mcg by mouth as needed. , Disp: , Rfl:    meclizine (ANTIVERT) 25 MG tablet, Take 25 mg by mouth 3 (three) times daily as needed for dizziness. , Disp: , Rfl:    memantine (NAMENDA) 5 MG tablet, Take 5 mg by mouth 2 (two) times daily. , Disp: , Rfl:    Minoxidil 5 % FOAM, Apply 1 application topically daily. , Disp: , Rfl:    pantoprazole (PROTONIX) 40 MG tablet, Take 40 mg by mouth 2 (two) times daily. , Disp: , Rfl:    pravastatin (PRAVACHOL) 20 MG tablet, Take 20 mg by mouth at bedtime. , Disp: , Rfl:    spironolactone (ALDACTONE) 25 MG tablet, Take 25 mg by  mouth daily. , Disp: , Rfl:    warfarin (COUMADIN) 4 MG tablet, Take 4 mg by mouth 3 (three) times a week., Disp: , Rfl:    warfarin (COUMADIN) 7.5 MG tablet, Take 1 tablet (7.5 mg total) one time only at 6 PM by mouth., Disp: 30 tablet, Rfl: 0   DULoxetine (CYMBALTA) 30 MG capsule, Take 1 capsule (30 mg total) by mouth daily., Disp: 30  capsule, Rfl: 2   Galcanezumab-gnlm 120 MG/ML SOSY, Inject 120 mg into the skin every 30 (thirty) days.  (Patient not taking: Reported on 07/05/2022), Disp: , Rfl:    ondansetron (ZOFRAN) 4 MG tablet, Take 1 tablet (4 mg total) by mouth daily as needed for nausea or vomiting. (Patient not taking: Reported on 07/05/2022), Disp: 20 tablet, Rfl: 0   predniSONE (STERAPRED UNI-PAK 21 TAB) 10 MG (21) TBPK tablet, Take 6 tablets the first day, take 5 tablets the second day, take 4 tablets the third day, take 3 tablets the fourth day, take 2 tablets the fifth day, take 1 tablet the sixth day. (Patient not taking: Reported on 07/05/2022), Disp: 21 tablet, Rfl: 0  Imaging Review  Cervical Imaging: Cervical MR wo contrast: Results for orders placed during the hospital encounter of 09/22/21  MR CERVICAL SPINE WO CONTRAST  Narrative CLINICAL DATA:  Upper extremity numbness.  EXAM: MRI CERVICAL SPINE WITHOUT CONTRAST  TECHNIQUE: Multiplanar, multisequence MR imaging of the cervical spine was performed. No intravenous contrast was administered.  COMPARISON:  MRI of the cervical spine September 06, 2019.  FINDINGS: Alignment: Straightening of the cervical curvature.  Vertebrae: No fracture, evidence of discitis, or bone lesion. Congenitally small spinal canal.  Cord: Normal signal and morphology.  Posterior Fossa, vertebral arteries, paraspinal tissues: Negative.  Disc levels:  C2-3: Mild uncovertebral degenerative changes resulting in mild left neural foraminal narrowing. No significant spinal canal stenosis.  C3-4: No spinal canal or neural foraminal stenosis.  C4-5: Shallow disc bulge and mild left uncovertebral degenerative changes resulting in mild left neural foraminal narrowing. No significant spinal canal stenosis.  C5-6: No spinal canal or neural foraminal stenosis. Left posterolateral disc protrusion described on prior MRI has resolved.  C6-7: No spinal canal or neural foraminal  stenosis.  C7-T1 facet degenerative changes without significant spinal canal or neural foraminal stenosis.  IMPRESSION: 1. Mild degenerative changes of the cervical spine resulting in mild left neural foraminal narrowing at C2-3 and C4-5. 2. Congenitally small spinal canal without high-grade spinal canal stenosis at any level.   Electronically Signed By: Pedro Earls M.D. On: 09/22/2021 17:06    Narrative CLINICAL DATA:  Head trauma.  EXAM: CT HEAD WITHOUT CONTRAST  CT CERVICAL SPINE WITHOUT CONTRAST  TECHNIQUE: Multidetector CT imaging of the head and cervical spine was performed following the standard protocol without intravenous contrast. Multiplanar CT image reconstructions of the cervical spine were also generated.  RADIATION DOSE REDUCTION: This exam was performed according to the departmental dose-optimization program which includes automated exposure control, adjustment of the mA and/or kV according to patient size and/or use of iterative reconstruction technique.  COMPARISON:  Head CT dated 08/29/2013.  FINDINGS: CT HEAD FINDINGS  Brain: The ventricles and sulci are appropriate size for the patient's age. The gray-white matter discrimination is preserved. There is no acute intracranial hemorrhage. No mass effect or midline shift. No extra-axial fluid collection.  Vascular: No hyperdense vessel or unexpected calcification.  Skull: Normal. Negative for fracture or focal lesion.  Sinuses/Orbits: No acute finding.  Other: None  CT CERVICAL SPINE FINDINGS  Alignment: No acute subluxation. There is straightening of normal cervical lordosis which may be positional or due to muscle spasm.  Skull base and vertebrae: No acute fracture.  Soft tissues and spinal canal: No prevertebral fluid or swelling. No visible canal hematoma.  Disc levels:  No acute findings.  Mild degenerative changes.  Upper chest: Negative.  Other: Median  sternotomy wires.  IMPRESSION: 1. No acute intracranial pathology. 2. No acute cervical spine fracture or subluxation.   Electronically Signed By: Anner Crete M.D. On: 06/06/2022 23:51   Narrative CLINICAL DATA:  Cervicalgia. Chronic neck and low back and bilateral shoulder pain.  EXAM: CERVICAL SPINE COMPLETE WITH FLEXION AND EXTENSION VIEWS  COMPARISON:  Cervical MRI dated 09/06/2019  FINDINGS: Cervical alignment is normal with no abnormal subluxation with flexion or extension. Prevertebral soft tissues are normal. No foraminal stenosis or facet arthritis.  IMPRESSION: No significant abnormality of the cervical spine.   Electronically Signed By: Lorriane Shire M.D. On: 12/24/2019 07:01 DG Shoulder Right  Narrative CLINICAL DATA:  Shoulder pain.  EXAM: RIGHT SHOULDER - 2+ VIEW  COMPARISON:  None.  FINDINGS: There is no evidence of fracture or dislocation. Minimal degenerative changes of the distal clavicle with no spur formation. Soft tissues are unremarkable.  IMPRESSION: Minimal degenerative changes of the distal clavicle. Otherwise normal.   Electronically Signed By: Lorriane Shire M.D. On: 12/24/2019 07:02    Narrative CLINICAL DATA:  Pain.  EXAM: LEFT SHOULDER - 2+ VIEW  COMPARISON:  None.  FINDINGS: There is no evidence of fracture or dislocation. There is no evidence of arthropathy or other focal bone abnormality. Soft tissues are unremarkable.  IMPRESSION: Normal exam.   Electronically Signed By: Lorriane Shire M.D. On: 12/24/2019 07:03  MR LUMBAR SPINE WO CONTRAST  Narrative CLINICAL DATA:  Lower back pain.  EXAM: MRI LUMBAR SPINE WITHOUT CONTRAST  TECHNIQUE: Multiplanar, multisequence MR imaging of the lumbar spine was performed. No intravenous contrast was administered.  COMPARISON:  12/23/2019 lumbar spine radiographs and prior. 08/23/2015 MRI lumbar spine and prior.  FINDINGS: Segmentation:   Standard.  Alignment:  Normal.  Vertebrae: Normal bone marrow signal intensity. No focal osseous lesions.  Conus medullaris and cauda equina: Conus extends to the L2-3 level, low-lying and unchanged. Conus and cauda equina appear normal.  Disc levels: Multilevel desiccation and mild disc space loss most prominent at the L3-S1 levels.  L1-2: Minimal disc bulge and bilateral facet hypertrophy. Prominent ligamentum flavum. Mild bilateral neural foraminal narrowing. Patent spinal canal.  L2-3: Mild disc bulge, ligamentum flavum and bilateral facet hypertrophy. Patent spinal canal and neural foramen.  L3-4: Mild disc bulge, ligamentum flavum and bilateral facet hypertrophy. Patent spinal canal and neural foramen.  L4-5: Disc bulge, ligamentum flavum and bilateral facet hypertrophy. Mild right neural foraminal narrowing. Patent spinal canal and left neural foramen.  L5-S1: Disc bulge, ligamentum flavum and exuberant facet hypertrophy. Mild bilateral neural foraminal narrowing. Patent spinal canal.  Paraspinal and other soft tissues: Partially imaged bilateral hydrosalpinx, better demonstrated on recent pelvis MRI. Unchanged ectasia of the left common iliac artery measuring 1.3 cm.  IMPRESSION: Mild bilateral L1-2, right L4-5 and bilateral L5-S1 neural foraminal narrowing.  No significant spinal canal narrowing.   Electronically Signed By: Primitivo Gauze M.D. On: 08/06/2020 08:57 DG Lumbar Spine Complete  Narrative *RADIOLOGY REPORT*  Clinical Data: Low back pain  LUMBAR SPINE - COMPLETE 4+ VIEW  Comparison: None.  Findings: Five non-rib bearing lumbar  type vertebral bodies are identified.  Normal alignment.  No compression deformity.  Normal visualized bowel gas pattern.  IMPRESSION: No acute osseous abnormality of the lumbar spine.  Original Report Authenticated By: Arline Asp, M.D.        Lumbar DG F/E views: No results found for this or any  previous visit.        Lumbar DG Bending views: Results for orders placed during the hospital encounter of 12/23/19  DG Lumbar Spine Complete W/Bend  Narrative CLINICAL DATA:  Low back pain.  EXAM: LUMBAR SPINE - COMPLETE WITH BENDING VIEWS  COMPARISON:  Radiographs dated 06/28/2012 and lumbar MRI dated 08/23/2015  FINDINGS: There has been significant progression of bilateral facet arthritis at L5-S1 with new slight degenerative changes of the facet joints bilaterally at L4-5. There is new grade 1 spondylolisthesis at L5-S1 which slightly increases with flexion and slightly reduces with extension.  Lateral alignment is otherwise normal. No disc space narrowing.  Aortic atherosclerosis.  IMPRESSION: 1. Progressive now severe bilateral facet arthritis at L5-S1 with new grade 1 spondylolisthesis which slightly increases with flexion and reduces with extension. 2. New slight facet arthritis at L4-5. 3. Aortic atherosclerosis.   Electronically Signed By: Lorriane Shire M.D. On: 12/24/2019 07:06  DG HIP UNILAT W OR W/O PELVIS 2-3 VIEWS RIGHT  Narrative CLINICAL DATA:  Hip pain.  EXAM: DG HIP (WITH OR WITHOUT PELVIS) 2-3V RIGHT  COMPARISON:  None.  FINDINGS: There is no evidence of hip fracture or dislocation. There is no evidence of arthropathy or other focal bone abnormality of the right hip. Severe bilateral facet arthritis at L5-S1. The sacroiliac joints appear normal.  IMPRESSION: Normal right hip. Severe bilateral facet arthritis at L5-S1.   Electronically Signed By: Lorriane Shire M.D. On: 12/24/2019 07:08  Hip-L DG 2-3 views: Results for orders placed during the hospital encounter of 12/23/19  DG HIP UNILAT W OR W/O PELVIS 2-3 VIEWS LEFT  Narrative CLINICAL DATA:  Left hip pain.  EXAM: DG HIP (WITH OR WITHOUT PELVIS) 2-3V LEFT  COMPARISON:  CT scan of the abdomen dated 07/03/2018  FINDINGS: There is no evidence of hip fracture or dislocation.  There is no evidence of arthropathy or other focal bone abnormality of the left hip.  There is a small soft tissue calcification adjacent to the left psoas muscle at the L4 level. This was not visible on the prior CT scan of the abdomen dated 07/03/2018. Does the patient have any symptoms suggestive of a ureteral stone?  IMPRESSION: Normal left hip.  Calcification in the left side of the abdomen could possibly represent a left ureteral stone.   Electronically Signed By: Lorriane Shire M.D. On: 12/24/2019 07:10  Narrative CLINICAL DATA:  Acute right knee pain after standing up from her desk at work and feeling a pulling sensation posteriorly.  EXAM: MRI OF THE RIGHT KNEE WITHOUT CONTRAST  TECHNIQUE: Multiplanar, multisequence MR imaging of the knee was performed. No intravenous contrast was administered.  COMPARISON:  Right knee x-rays dated June 28, 2012.  FINDINGS: MENISCI  Medial meniscus:  Intact.  Lateral meniscus:  Intact.  LIGAMENTS  Cruciates:  Intact ACL and PCL.  Collaterals: Medial collateral ligament is intact. Lateral collateral ligament complex is intact.  CARTILAGE  Patellofemoral: Early patellar cartilage degenerative signal. No focal defect.  Medial:  No chondral defect.  Lateral:  No chondral defect.  Joint:  No joint effusion. Normal Hoffa's fat. No plical thickening.  Popliteal Fossa:  No Baker  cyst. Intact popliteus tendon.  Extensor Mechanism: Intact quadriceps tendon and patellar tendon. Intact medial and lateral patellar retinaculum. Intact MPFL.  Bones: No focal marrow signal abnormality. No fracture or dislocation.  Other: Mild nonspecific edema in the popliteal fossa. No soft tissue mass or fluid collection.  IMPRESSION: 1. No meniscal or ligamentous injury.   Electronically Signed By: Titus Dubin M.D. On: 04/12/2019 08:34    Narrative CLINICAL DATA:  Right knee pain.  EXAM: RIGHT KNEE - 1-2  VIEW  COMPARISON:  None.  FINDINGS: No evidence of fracture, dislocation, or joint effusion. No evidence of arthropathy or other focal bone abnormality. Soft tissues are unremarkable.  IMPRESSION: Normal exam.   Electronically Signed By: Lorriane Shire M.D. On: 12/24/2019 07:10   Narrative CLINICAL DATA:  Left knee pain.  EXAM: LEFT KNEE - 1-2 VIEW  COMPARISON:  None.  FINDINGS: No evidence of fracture, dislocation, or joint effusion. No evidence of arthropathy or other focal bone abnormality. Soft tissues are unremarkable.  IMPRESSION: Normal exam.   Electronically Signed By: Lorriane Shire M.D. On: 12/24/2019 07:11  Narrative CLINICAL DATA:  Right knee pain.  EXAM: RIGHT KNEE - COMPLETE 4+ VIEW  COMPARISON:  December 23, 2019  FINDINGS: No evidence of fracture, dislocation, or joint effusion. No evidence of arthropathy or other focal bone abnormality. Soft tissues are unremarkable.  IMPRESSION: Negative.   Electronically Signed By: Virgina Norfolk M.D. On: 08/21/2021 20:59   Complexity Note: Imaging results reviewed.                         ROS  Cardiovascular: Heart trouble, Abnormal heart rhythm, Daily Aspirin intake, High blood pressure, Heart surgery, Heart murmur, Heart valve problems, Blood thinners:  Antiplatelet, and Needs antibiotics prior to dental procedures Pulmonary or Respiratory: No reported pulmonary signs or symptoms such as wheezing and difficulty taking a deep full breath (Asthma), difficulty blowing air out (Emphysema), coughing up mucus (Bronchitis), persistent dry cough, or temporary stoppage of breathing during sleep Neurological: No reported neurological signs or symptoms such as seizures, abnormal skin sensations, urinary and/or fecal incontinence, being born with an abnormal open spine and/or a tethered spinal cord Psychological-Psychiatric: No reported psychological or psychiatric signs or symptoms such as difficulty  sleeping, anxiety, depression, delusions or hallucinations (schizophrenial), mood swings (bipolar disorders) or suicidal ideations or attempts Gastrointestinal: Heartburn due to stomach pushing into lungs (Hiatal hernia), Reflux or heatburn, Alternating episodes iof diarrhea and constipation (IBS-Irritable bowe syndrome), and No reported gastrointestinal signs or symptoms such as vomiting or evacuating blood, reflux, heartburn, alternating episodes of diarrhea and constipation, inflamed or scarred liver, or pancreas or irrregular and/or infrequent bowel movements Genitourinary: Peeing blood and Recurrent Urinary Tract infections Hematological: Weakness due to low blood hemoglobin or red blood cell count (Anemia), Brusing easily, and Bleeding easily Endocrine: High blood sugar controlled without the use of insulin (NIDDM) Rheumatologic: Joint aches and or swelling due to excess weight (Osteoarthritis) Musculoskeletal: Negative for myasthenia gravis, muscular dystrophy, multiple sclerosis or malignant hyperthermia Work History: Disabled  Allergies  Ms. Bechtold is allergic to ivp dye [iodinated contrast media], sulfa antibiotics, banana, guaifenesin, hctz [hydrochlorothiazide], keflex [cephalexin], losartan, povidone iodine, lisinopril, and mucinex [guaifenesin er].  Laboratory Chemistry Profile   Renal Lab Results  Component Value Date   BUN 9 06/06/2022   CREATININE 0.91 06/06/2022   GFRAA >60 07/02/2020   GFRNONAA >60 06/06/2022   SPECGRAV 1.010 05/27/2020   PHUR 7.5 05/27/2020  PROTEINUR NEGATIVE 06/30/2020     Electrolytes Lab Results  Component Value Date   NA 141 06/06/2022   K 3.5 06/06/2022   CL 111 06/06/2022   CALCIUM 9.4 06/06/2022   MG 2.2 04/17/2021   PHOS 3.6 03/05/2015     Hepatic Lab Results  Component Value Date   AST 23 07/14/2020   ALT 21 07/14/2020   ALBUMIN 4.1 07/14/2020   ALKPHOS 108 07/14/2020   LIPASE 262 (H) 07/02/2020     ID Lab Results   Component Value Date   SARSCOV2NAA NEGATIVE 09/17/2020   MRSAPCR NEGATIVE 10/03/2017     Bone Lab Results  Component Value Date   VD25OH 48.2 12/23/2019     Endocrine Lab Results  Component Value Date   GLUCOSE 101 (H) 06/06/2022   GLUCOSEU NEGATIVE 06/30/2020   TSH 2.840 10/02/2017     Neuropathy Lab Results  Component Value Date   [id number] 993 12/23/2019     CNS No results found for: "COLORCSF", "APPEARCSF", "RBCCOUNTCSF", "WBCCSF", "POLYSCSF", "LYMPHSCSF", "EOSCSF", "PROTEINCSF", "GLUCCSF", "JCVIRUS", "CSFOLI", "IGGCSF", "LABACHR", "ACETBL"   Inflammation (CRP: Acute  ESR: Chronic) Lab Results  Component Value Date   CRP 0.8 12/23/2019   ESRSEDRATE 30 (H) 12/23/2019   LATICACIDVEN 0.9 06/12/2017     Rheumatology No results found for: "RF", "ANA", "LABURIC", "URICUR", "LYMEIGGIGMAB", "LYMEABIGMQN", "HLAB27"   Coagulation Lab Results  Component Value Date   INR 2.5 (H) 06/06/2022   LABPROT 26.5 (H) 06/06/2022   APTT 41 (H) 05/29/2015   PLT 261 06/06/2022     Cardiovascular Lab Results  Component Value Date   BNP 215.0 (H) 03/05/2015   TROPONINI <0.03 10/02/2017   HGB 12.8 06/06/2022   HCT 41.7 06/06/2022     Screening Lab Results  Component Value Date   SARSCOV2NAA NEGATIVE 09/17/2020   MRSAPCR NEGATIVE 10/03/2017     Cancer No results found for: "CEA", "CA125", "LABCA2"   Allergens No results found for: "ALMOND", "APPLE", "ASPARAGUS", "AVOCADO", "BANANA", "BARLEY", "BASIL", "BAYLEAF", "GREENBEAN", "LIMABEAN", "WHITEBEAN", "BEEFIGE", "REDBEET", "BLUEBERRY", "BROCCOLI", "CABBAGE", "MELON", "CARROT", "CASEIN", "CASHEWNUT", "CAULIFLOWER", "CELERY"     Note: Lab results reviewed.  PFSH  Drug: Ms. Heinle  reports no history of drug use. Alcohol:  reports no history of alcohol use. Tobacco:  reports that she has been smoking cigarettes. She has a 12.50 pack-year smoking history. She has never used smokeless tobacco. Medical:  has a past medical  history of Acid reflux, Anemia, Anxiety, Arthritis, Asthma, CHF (congestive heart failure) ([place]), Chronic anticoagulation, Chronic pain, Cirrhosis ([place]), Coronary artery disease, Depression, Diabetes mellitus without complication ([place]), Dyspnea, Dysrhythmia, Esophagitis, Headache, Heart valve replaced, Helicobacter pylori infection, High cholesterol, High cholesterol, History of hiatal hernia, History of methicillin resistant staphylococcus aureus (MRSA), Hyperaldosteronism ([place]), Hypertension, Hypokalemia, IBS (irritable bowel syndrome), IBS (irritable bowel syndrome), Sciatic pain, and Sleep apnea. Family: family history includes Breast cancer in her paternal grandmother.  Past Surgical History:  Procedure Laterality Date   ABDOMINAL HYSTERECTOMY     AORTIC VALVE REPLACEMENT     BREAST BIOPSY Left 07/25/2017   BENIGN BREAST TISSUE WITH MICROCALCIFICATIONS   BREAST BIOPSY Right 10/22/2019   Affirm Bx- X-clip- CSL, no atypia   BREAST BIOPSY Right 10/22/2019   Affirm Bx- Ribbon clip- CSL, no atypia   BREAST BIOPSY Right 11/20/2019   Procedure: BREAST BIOPSY WITH NEEDLE LOCALIZATION x 2;  Surgeon: Herbert Pun, MD;  Location: [facility];  Service: General;  Laterality: Right;   BREAST EXCISIONAL BIOPSY Right 1992   NEG  BREAST LUMPECTOMY Right 11/20/2019   complex sclerosing lesion x 2   CARDIAC VALVE SURGERY     CESAREAN SECTION     X2   COLONOSCOPY WITH PROPOFOL N/A 09/21/2020   Procedure: COLONOSCOPY WITH PROPOFOL;  Surgeon: Toledo, Benay Pike, MD;  Location: [facility];  Service: Gastroenterology;  Laterality: N/A;   ESOPHAGOGASTRODUODENOSCOPY (EGD) WITH PROPOFOL N/A 10/07/2016   Procedure: ESOPHAGOGASTRODUODENOSCOPY (EGD) WITH PROPOFOL;  Surgeon: Jonathon Bellows, MD;  Location: [facility];  Service: Endoscopy;  Laterality: N/A;   ESOPHAGOGASTRODUODENOSCOPY (EGD) WITH PROPOFOL N/A 09/21/2020   Procedure: ESOPHAGOGASTRODUODENOSCOPY (EGD) WITH PROPOFOL;  Surgeon: Toledo, Benay Pike,  MD;  Location: [facility];  Service: Gastroenterology;  Laterality: N/A;   HERNIA REPAIR     LAPAROSCOPIC GASTRIC BAND REMOVAL WITH LAPAROSCOPIC GASTRIC SLEEVE RESECTION     MITRAL VALVE REPLACEMENT     TRICUSPID VALVE REPLACEMENT     VENTRAL HERNIA REPAIR     Active Ambulatory Problems    Diagnosis Date Noted   Acute respiratory failure with hypoxemia ([place]) 03/05/2015   Acute right heart failure ([place]) 04/04/2015   Benign essential HTN 05/05/2015   Acute on chronic combined systolic and diastolic congestive heart failure ([place]) 04/08/2015   History of open heart surgery 04/03/2015   H/O mitral valve replacement with mechanical valve 04/03/2015   H/O tricuspid valve replacement 04/03/2015   Combined fat and carbohydrate induced hyperlipemia 09/11/2014   Adiposity 06/30/2015   Obstructive apnea 10/28/2014   Absolute anemia 04/26/2015   H/O aortic valve replacement 04/07/2015   H/O heart valve replacement with mechanical valve 04/07/2015   H/O prosthetic heart valve 04/07/2015   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 04/07/2015   Compulsive tobacco user syndrome 06/30/2015   Bilateral leg edema 08/23/2016   Chronic anticoagulation (Coumadin) 05/07/2015   Chronic hypokalemia 06/15/2015   Coronary artery disease due to calcified coronary lesion 03/04/2015   Diabetes mellitus type 2, diet-controlled ([place]) 05/17/2016   Enlarged heart 10/28/2014   Gastroesophageal reflux disease without esophagitis 10/12/2015   Osteoarthritis of spine with radiculopathy, lumbar region 08/08/2016   Palpitations 05/18/2015   Spondylosis of lumbar region without myelopathy or radiculopathy 10/12/2015   Tobacco abuse 09/19/2016   Vitamin D deficiency 05/17/2016   Pleural effusion 10/02/2017   HCAP (healthcare-associated pneumonia) 10/03/2017   On anticoagulant therapy 44/96/7591   Positive Helicobacter pylori serology 01/05/2017   Anxiety state 04/02/2018   Asthma 09/07/2016   Bilateral hand numbness  10/22/2019   Cervical radiculopathy 06/12/2018   Chest pain on breathing 09/21/2017   Diabetes mellitus ([place]) 09/07/2016   Cirrhosis of liver without ascites ([place]) 10/28/2019   Elevated hemoglobin A1c 10/28/2019   GERD with esophagitis 09/07/2016   Hyperaldosteronism ([place]) 06/24/2018   Hypertension 09/07/2016   Hypertension due to endocrine disorder 08/17/2018   Hypertension, well controlled 08/14/2018   Irritable bowel syndrome with constipation 12/26/2018   Lumbar radicular pain 10/28/2019   Aortic valve disease, 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   Cervicalgia 10/22/2019   Presence of prosthetic heart valve 04/03/2015   SOBOE (shortness of breath on exertion) 04/24/2017   Status post bariatric surgery 09/12/2018   Tricuspid valve disorder 09/21/2017   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   BMI 40.0-44.9, adult ([place]) 04/10/2019   Hypercholesterolemia 09/07/2016   Nicotine dependence, uncomplicated 63/84/6659   H/O mechanical aortic valve replacement 12/16/2019   Obstructive sleep apnea syndrome 09/07/2016   Chronic low back pain (1ry area of Pain) (Bilateral) (L>R) w/o sciatica 12/16/2019   Chronic lower extremity pain (2ry area of Pain) (Bilateral) (  L>R) 12/16/2019   Chronic hip pain (3ry area of Pain) (Bilateral) (L>R) 12/16/2019   Chronic knee pain (4th area of Pain) (Bilateral) (L>R) 12/16/2019   Chronic recurrent occipital headache (Bilateral) 12/16/2019   Cervicogenic headache (6th area of Pain) (Bilateral) 12/16/2019   Chronic neck pain (5th area of Pain) (Bilateral) (L>R) 12/16/2019   Chronic shoulder pain (Bilateral) (L>R) 12/16/2019   Chronic upper extremity pain (7th area of Pain) (Bilateral) (R>L) 12/16/2019   Chronic pain syndrome 12/16/2019   Pharmacologic therapy 12/16/2019   Disorder of skeletal system 12/16/2019   Problems influencing health status 12/16/2019   Abnormal MRI, cervical spine (09/06/2019) 12/16/2019   Cervical central spinal stenosis 12/16/2019    Cervical foraminal stenosis (C2-3, C4-5) (Left) 12/16/2019   Abnormal MRI, lumbar spine (08/24/2015) 12/16/2019   Lumbar facet hypertrophy (L4-5 and L5-S1) (Bilateral) 12/16/2019   Lumbar facet syndrome (Bilateral) (L>R) 12/16/2019   DDD (degenerative disc disease), lumbosacral 01/07/2020   History of Allergy to iodine 01/07/2020   History of allergy to radiographic contrast media 01/07/2020   Chronic prescription opiate use 01/16/2020   Neurogenic pain 01/16/2020   Lumbosacral L5-S1 facet arthritis (Severe) (Bilateral) 02/18/2020   Osteoarthritis of hips (Bilateral) 02/18/2020   Refusal of blood transfusions as patient is Jehovah's Witness 03/12/2015   Mitral valve disease, 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   History of tricuspid valve replacement with bioprosthetic valve 04/03/2015   Resolved Ambulatory Problems    Diagnosis Date Noted   No Resolved Ambulatory Problems   Past Medical History:  Diagnosis Date   Acid reflux    Anemia    Anxiety    Arthritis    CHF (congestive heart failure) (HCC)    Chronic pain    Cirrhosis (HCC)    Coronary artery disease    Depression    Diabetes mellitus without complication (HCC)    Dyspnea    Dysrhythmia    Esophagitis    Headache    Heart valve replaced    Helicobacter pylori infection    High cholesterol    High cholesterol    History of hiatal hernia    History of methicillin resistant staphylococcus aureus (MRSA)    Hypokalemia    IBS (irritable bowel syndrome)    IBS (irritable bowel syndrome)    Sciatic pain    Sleep apnea    Constitutional Exam  General appearance: Well nourished, well developed, and well hydrated. In no apparent acute distress Vitals:   07/05/22 1407  BP: 121/79  Pulse: 98  Resp: 16  Temp: (!) 97.2 F (36.2 C)  SpO2: 99%  Weight: 237 lb 4.8 oz (107.6 kg)  Height: _0  (1.6 m)   BMI Assessment: Estimated body mass index is 42.04 kg/m as calculated from the following:   Height as of this encounter: _1   (1.6 m).   Weight as of this encounter: 237 lb 4.8 oz (107.6 kg).  BMI interpretation table: BMI level Category Range association with higher incidence of chronic pain  <18 kg/m2 Underweight   18.5-24.9 kg/m2 Ideal body weight   25-29.9 kg/m2 Overweight Increased incidence by 20%  30-34.9 kg/m2 Obese (Class I) Increased incidence by 68%  35-39.9 kg/m2 Severe obesity (Class II) Increased incidence by 136%  >40 kg/m2 Extreme obesity (Class III) Increased incidence by 254%   Patient's current BMI Ideal Body weight  Body mass index is 42.04 kg/m. Ideal body weight: 52.4 kg (115 lb 8.3 oz) Adjusted ideal body weight: 74.5 kg (164 lb 3.7 oz)   BMI  Readings from Last 4 Encounters:  07/05/22 42.04 kg/m  06/06/22 40.22 kg/m  08/21/21 40.38 kg/m  04/17/21 40.39 kg/m   Wt Readings from Last 4 Encounters:  07/05/22 237 lb 4.8 oz (107.6 kg)  06/06/22 227 lb 1.2 oz (103 kg)  08/21/21 227 lb 15.3 oz (103.4 kg)  04/17/21 228 lb (103.4 kg)    Psych/Mental status: Alert, oriented x 3 (person, place, & time)       Eyes: PERLA Respiratory: No evidence of acute respiratory distress  Cervical Spine Area Exam  Skin & Axial Inspection: No masses, redness, edema, swelling, or associated skin lesions Alignment: Symmetrical Functional ROM: Pain restricted ROM, bilaterally Stability: No instability detected Muscle Tone/Strength: Functionally intact. No obvious neuro-muscular anomalies detected. Sensory (Neurological): Dermatomal pain pattern and musculoskeletal  Upper Extremity (UE) Exam    Side: Right upper extremity  Side: Left upper extremity  Skin & Extremity Inspection: Skin color, temperature, and hair growth are WNL. No peripheral edema or cyanosis. No masses, redness, swelling, asymmetry, or associated skin lesions. No contractures.  Skin & Extremity Inspection: Skin color, temperature, and hair growth are WNL. No peripheral edema or cyanosis. No masses, redness, swelling, asymmetry, or  associated skin lesions. No contractures.  Functional ROM: Unrestricted ROM          Functional ROM: Unrestricted ROM          Muscle Tone/Strength: Functionally intact. No obvious neuro-muscular anomalies detected.  Muscle Tone/Strength: Functionally intact. No obvious neuro-muscular anomalies detected.  Sensory (Neurological): Musculoskeletal pain pattern          Sensory (Neurological): Musculoskeletal pain pattern          Palpation: No palpable anomalies              Palpation: No palpable anomalies              Provocative Test(s):  Phalen's test: deferred Tinel's test: deferred Apley's scratch test (touch opposite shoulder):  Action 1 (Across chest): deferred Action 2 (Overhead): deferred Action 3 (LB reach): deferred   Provocative Test(s):  Phalen's test: deferred Tinel's test: deferred Apley's scratch test (touch opposite shoulder):  Action 1 (Across chest): deferred Action 2 (Overhead): deferred Action 3 (LB reach): deferred    Lumbar Spine Area Exam  Skin & Axial Inspection: No masses, redness, or swelling Alignment: Symmetrical Functional ROM: Pain restricted ROM       Stability: No instability detected Muscle Tone/Strength: Functionally intact. No obvious neuro-muscular anomalies detected. Sensory (Neurological): Musculoskeletal pain pattern  Lower Extremity Exam    Side: Right lower extremity  Side: Left lower extremity  Stability: No instability observed          Stability: No instability observed          Skin & Extremity Inspection: Skin color, temperature, and hair growth are WNL. No peripheral edema or cyanosis. No masses, redness, swelling, asymmetry, or associated skin lesions. No contractures.  Skin & Extremity Inspection: Skin color, temperature, and hair growth are WNL. No peripheral edema or cyanosis. No masses, redness, swelling, asymmetry, or associated skin lesions. No contractures.  Functional ROM: Unrestricted ROM                  Functional ROM:  Unrestricted ROM                  Muscle Tone/Strength: Functionally intact. No obvious neuro-muscular anomalies detected.  Muscle Tone/Strength: Functionally intact. No obvious neuro-muscular anomalies detected.  Sensory (Neurological): Neurogenic pain pattern  Sensory (Neurological): Neurogenic pain pattern        DTR: Patellar: deferred today Achilles: deferred today Plantar: deferred today  DTR: Patellar: deferred today Achilles: deferred today Plantar: deferred today  Palpation: No palpable anomalies  Palpation: No palpable anomalies    Assessment  Primary Diagnosis & Pertinent Problem List: The primary encounter diagnosis was Lumbosacral L5-S1 facet arthritis (Severe) (Bilateral). Diagnoses of Neurogenic pain, Lumbar facet hypertrophy (L4-5 and L5-S1) (Bilateral), and Osteoarthritis of hips (Bilateral) were also pertinent to this visit.  Visit Diagnosis (New problems to examiner): 1. Lumbosacral L5-S1 facet arthritis (Severe) (Bilateral)   2. Neurogenic pain   3. Lumbar facet hypertrophy (L4-5 and L5-S1) (Bilateral)   4. Osteoarthritis of hips (Bilateral)    Plan of Care (Initial workup plan)   I had a long discussion with the patient and her husband explaining my concerns regarding spinal cord stimulation in the context of her being anticoagulated with warfarin and having a target INR of 2.5-3.5.  Furthermore she is also a type II diabetic.  She has a mitral valve replacement.  Based upon her chronic anticoagulation in the context of her coronary artery disease and mitral valve replacement, patient is not a candidate for spinal cord stimulation with me.  She would need to hold anticoagulation during her SCS trial which is not in her best interest given her cardiac disease and mitral valve replacement.  I did discuss Qutenza treatment for painful diabetic neuropathy of bilateral feet.  I also recommend that she retrial Cymbalta which she can continue with her primary care  provider.  I will start this at 30 mg daily.  She states that she has been on this medication in the past but has not had a prescription since 2020.  She can follow-up in the future if she would like to consider Qutenza treatment.  Otherwise, continue care with Dr. Ginette Pitman and [place].  I spent a total of 60 minutes reviewing chart data, face-to-face evaluation with the patient, counseling and coordination of care as detailed above.   Provider-requested follow-up: Return for PRN.  No future appointments.  Note by: Gillis Santa, MD Date: 07/05/2022; Time: 3:03 PM

## 2022-07-05 NOTE — Patient Instructions (Addendum)
You are not a good candidate for spinal cord stim trial Try a TENS unit from [employer], apply to lower back Discussed Qutenza for PDN Discuss Lyrica, Gabapentin, Cymbalta for fibromyalgia management. I will send in initial Rx for Cymbalta, follow up with Dr Ginette Pitman for Rx titration

## 2022-07-12 ENCOUNTER — Other Ambulatory Visit: Payer: Self-pay | Admitting: Family Medicine

## 2022-07-12 ENCOUNTER — Other Ambulatory Visit (HOSPITAL_COMMUNITY): Payer: Self-pay | Admitting: Family Medicine

## 2022-07-12 ENCOUNTER — Ambulatory Visit
Admission: RE | Admit: 2022-07-12 | Discharge: 2022-07-12 | Disposition: A | Discharge status: Home / Self Care | Payer: 59 | Source: Ambulatory Visit | Attending: Family Medicine | Admitting: Family Medicine

## 2022-07-12 DIAGNOSIS — R1031 Right lower quadrant pain: Secondary | ICD-10-CM | POA: Diagnosis present

## 2022-07-12 DIAGNOSIS — R142 Eructation: Secondary | ICD-10-CM

## 2022-07-12 DIAGNOSIS — R140 Abdominal distension (gaseous): Secondary | ICD-10-CM | POA: Diagnosis present

## 2022-07-12 DIAGNOSIS — R141 Gas pain: Secondary | ICD-10-CM

## 2022-07-12 DIAGNOSIS — R112 Nausea with vomiting, unspecified: Secondary | ICD-10-CM

## 2022-07-12 DIAGNOSIS — R1032 Left lower quadrant pain: Secondary | ICD-10-CM

## 2022-07-12 DIAGNOSIS — R197 Diarrhea, unspecified: Secondary | ICD-10-CM | POA: Diagnosis present

## 2022-07-14 ENCOUNTER — Encounter: Payer: 59 | Admitting: Dermatology

## 2022-08-29 ENCOUNTER — Inpatient Hospital Stay: Payer: 59 | Attending: Oncology | Admitting: Oncology

## 2022-08-29 ENCOUNTER — Encounter: Payer: Self-pay | Admitting: Oncology

## 2022-08-29 ENCOUNTER — Inpatient Hospital Stay: Payer: 59

## 2022-08-29 VITALS — BP 141/91 | HR 66 | Temp 97.5°F | Resp 18 | Wt 230.8 lb

## 2022-08-29 DIAGNOSIS — R768 Other specified abnormal immunological findings in serum: Secondary | ICD-10-CM | POA: Diagnosis present

## 2022-08-29 DIAGNOSIS — Z808 Family history of malignant neoplasm of other organs or systems: Secondary | ICD-10-CM | POA: Diagnosis not present

## 2022-08-29 DIAGNOSIS — I110 Hypertensive heart disease with heart failure: Secondary | ICD-10-CM | POA: Diagnosis not present

## 2022-08-29 DIAGNOSIS — Z800 Family history of malignant neoplasm of digestive organs: Secondary | ICD-10-CM | POA: Insufficient documentation

## 2022-08-29 DIAGNOSIS — E119 Type 2 diabetes mellitus without complications: Secondary | ICD-10-CM | POA: Insufficient documentation

## 2022-08-29 DIAGNOSIS — I509 Heart failure, unspecified: Secondary | ICD-10-CM | POA: Insufficient documentation

## 2022-08-29 DIAGNOSIS — F17210 Nicotine dependence, cigarettes, uncomplicated: Secondary | ICD-10-CM | POA: Diagnosis not present

## 2022-08-29 DIAGNOSIS — Z952 Presence of prosthetic heart valve: Secondary | ICD-10-CM | POA: Insufficient documentation

## 2022-08-29 DIAGNOSIS — Z803 Family history of malignant neoplasm of breast: Secondary | ICD-10-CM | POA: Diagnosis not present

## 2022-08-29 DIAGNOSIS — Z801 Family history of malignant neoplasm of trachea, bronchus and lung: Secondary | ICD-10-CM | POA: Insufficient documentation

## 2022-08-29 DIAGNOSIS — Z90710 Acquired absence of both cervix and uterus: Secondary | ICD-10-CM | POA: Insufficient documentation

## 2022-08-29 LAB — SEDIMENTATION RATE: Sed Rate: 45 mm/hr — ABNORMAL HIGH (ref 0–30)

## 2022-08-29 LAB — C-REACTIVE PROTEIN: CRP: 2.2 mg/dL — ABNORMAL HIGH (ref <1.0)

## 2022-08-29 NOTE — Progress Notes (Signed)
Pt here to establish care. Pt reports increased frequency in headaches, to the point that makes her vomit.

## 2022-08-29 NOTE — Progress Notes (Addendum)
Hematology/Oncology Consult note [phone] [phone]         Patient Care Team: Tracie Harrier, MD as PCP - General (Internal Medicine)  REFERRING PROVIDER: Tracie Harrier, MD   CHIEF COMPLAINTS/REASON FOR VISIT:  Evaluation of IgG  HISTORY OF PRESENTING ILLNESS:   Shelly Huynh is a  51 y.o.  female with PMH listed below was seen in consultation at the request of  Hande, Cherlyn Labella, MD  for evaluation of elevation of IgG  Patient chronically elevated total protein. 08/10/22 myeloma panel showed IgG level of 1836, IgA 493, IFE showed polyclonal increase detected in one or more immunoglobulins.   Patient has history of rheumatic valve disease, s/p aortic valve replacement.  Multiple other comorbidity  + hair loss   MEDICAL HISTORY:  Past Medical History:  Diagnosis Date   Acid reflux    Anemia    Anxiety    Arthritis    OSTEO   Asthma    CHF (congestive heart failure) (HCC)    Chronic anticoagulation    Chronic pain    Cirrhosis (HCC)    NASH   Coronary artery disease    Depression    Diabetes mellitus without complication (HCC)    Dyspnea    Dysrhythmia    AFTER COVID VACCINE ARRYTHMIAS-SEEING [place]    Esophagitis    Headache    MIGRAINES   Heart valve replaced    Helicobacter pylori infection    High cholesterol    High cholesterol    History of hiatal hernia    History of methicillin resistant staphylococcus aureus (MRSA)    Hyperaldosteronism (HCC)    Hypertension    Hypokalemia    IBS (irritable bowel syndrome)    IBS (irritable bowel syndrome)    Sciatic pain    Sleep apnea    USES CPAP    SURGICAL HISTORY: Past Surgical History:  Procedure Laterality Date   ABDOMINAL HYSTERECTOMY     AORTIC VALVE REPLACEMENT     BREAST BIOPSY Left 07/25/2017   BENIGN BREAST TISSUE WITH MICROCALCIFICATIONS   BREAST BIOPSY Right 10/22/2019   Affirm Bx- X-clip- CSL, no atypia   BREAST BIOPSY Right 10/22/2019   Affirm  Bx- Ribbon clip- CSL, no atypia   BREAST BIOPSY Right 11/20/2019   Procedure: BREAST BIOPSY WITH NEEDLE LOCALIZATION x 2;  Surgeon: Herbert Pun, MD;  Location: [facility];  Service: General;  Laterality: Right;   BREAST EXCISIONAL BIOPSY Right 1992   NEG   BREAST LUMPECTOMY Right 11/20/2019   complex sclerosing lesion x 2   CARDIAC VALVE SURGERY     CESAREAN SECTION     X2   COLONOSCOPY WITH PROPOFOL N/A 09/21/2020   Procedure: COLONOSCOPY WITH PROPOFOL;  Surgeon: Toledo, Benay Pike, MD;  Location: [facility];  Service: Gastroenterology;  Laterality: N/A;   ESOPHAGOGASTRODUODENOSCOPY (EGD) WITH PROPOFOL N/A 10/07/2016   Procedure: ESOPHAGOGASTRODUODENOSCOPY (EGD) WITH PROPOFOL;  Surgeon: Jonathon Bellows, MD;  Location: [facility];  Service: Endoscopy;  Laterality: N/A;   ESOPHAGOGASTRODUODENOSCOPY (EGD) WITH PROPOFOL N/A 09/21/2020   Procedure: ESOPHAGOGASTRODUODENOSCOPY (EGD) WITH PROPOFOL;  Surgeon: Toledo, Benay Pike, MD;  Location: [facility];  Service: Gastroenterology;  Laterality: N/A;   HERNIA REPAIR     LAPAROSCOPIC GASTRIC BAND REMOVAL WITH LAPAROSCOPIC GASTRIC SLEEVE RESECTION     MITRAL VALVE REPLACEMENT     TRICUSPID VALVE REPLACEMENT     VENTRAL HERNIA REPAIR      SOCIAL HISTORY: Social History   Socioeconomic History   Marital status: Married  Spouse name: Not on file   Number of children: Not on file   Years of education: Not on file   Highest education level: Not on file  Occupational History   Not on file  Tobacco Use   Smoking status: Every Day    Packs/day: 0.50    Years: 25.00    Total pack years: 12.50    Types: Cigarettes   Smokeless tobacco: Never  Vaping Use   Vaping Use: Never used  Substance and Sexual Activity   Alcohol use: No   Drug use: No   Sexual activity: Not on file  Other Topics Concern   Not on file  Social History Narrative   Not on file   Social Determinants of Health   Financial Resource Strain: Medium Risk  (10/02/2017)   Overall Financial Resource Strain (CARDIA)    Difficulty of Paying Living Expenses: Somewhat hard  Food Insecurity: Food Insecurity Present (10/02/2017)   Hunger Vital Sign    Worried About Running Out of Food in the Last Year: Often true    Ran Out of Food in the Last Year: Often true  Transportation Needs: No Transportation Needs (10/02/2017)   PRAPARE - Hydrologist (Medical): No    Lack of Transportation (Non-Medical): No  Physical Activity: Unknown (10/02/2017)   Exercise Vital Sign    Days of Exercise per Week: 0 days    Minutes of Exercise per Session: Not on file  Stress: Stress Concern Present (10/02/2017)   [place]    Feeling of Stress : Very much  Social Connections: Socially Integrated (10/02/2017)   Social Connection and Isolation Panel [NHANES]    Frequency of Communication with Friends and Family: More than three times a week    Frequency of Social Gatherings with Friends and Family: Once a week    Attends Religious Services: More than 4 times per year    Active Member of [employer] or Organizations: Yes    Attends Music therapist: More than 4 times per year    Marital Status: Married  Human resources officer Violence: Not At Risk (10/02/2017)   Humiliation, Afraid, Rape, and Kick questionnaire    Fear of Current or Ex-Partner: No    Emotionally Abused: No    Physically Abused: No    Sexually Abused: No    FAMILY HISTORY: Family History  Problem Relation Age of Onset   Thyroid cancer Mother    Uterine cancer Mother    Breast cancer Paternal Grandmother    Lung cancer Paternal Grandmother    Pancreatic cancer Cousin     ALLERGIES:  is allergic to ivp dye [iodinated contrast media], sulfa antibiotics, banana, guaifenesin, hctz [hydrochlorothiazide], keflex [cephalexin], losartan, povidone iodine, lisinopril, and mucinex [guaifenesin  er].  MEDICATIONS:  Current Outpatient Medications  Medication Sig Dispense Refill   albuterol (VENTOLIN HFA) 108 (90 Base) MCG/ACT inhaler Inhale 2 puffs into the lungs every 6 (six) hours as needed for wheezing or shortness of breath.     aspirin EC 81 MG tablet Take 81 mg daily by mouth.     candesartan (ATACAND) 8 MG tablet Take 8 mg by mouth at bedtime.      Cholecalciferol 25 MCG (1000 UT) tablet Take 1,000 Units by mouth daily.      Cranberry 400 MG CAPS      cyclobenzaprine (FLEXERIL) 10 MG tablet Take 1 tablet by mouth 3 (three) times daily as needed.  diazepam (VALIUM) 5 MG tablet Take 5 mg by mouth every 8 (eight) hours as needed for anxiety or muscle spasms.      docusate sodium (COLACE) 100 MG capsule Take 100 mg by mouth daily as needed for mild constipation.      DULoxetine (CYMBALTA) 30 MG capsule Take 1 capsule (30 mg total) by mouth daily. 30 capsule 2   Flaxseed, Linseed, (FLAX SEED OIL PO) Take 1 tablet by mouth daily.     fluticasone (FLONASE) 50 MCG/ACT nasal spray Place 2 sprays into both nostrils daily as needed for allergies or rhinitis.     gabapentin (NEURONTIN) 300 MG capsule Take by mouth 2 (two) times daily.     Galcanezumab-gnlm (EMGALITY [place]) Inject into the skin.     HYDROcodone-acetaminophen (NORCO/VICODIN) 5-325 MG tablet Take 1 tablet by mouth every 6 (six) hours as needed for moderate pain.     hydrocortisone 2.5 % cream Apply 1 application topically 2 (two) times a week.     iron polysaccharides (NIFEREX) 150 MG capsule Take 150 mg by mouth daily.     levalbuterol (XOPENEX) 1.25 MG/0.5ML nebulizer solution Take 1.25 mg by nebulization every 4 (four) hours as needed for wheezing or shortness of breath.     levocetirizine (XYZAL) 5 MG tablet Take 5 mg by mouth every evening.      linaclotide (LINZESS) 72 MCG capsule Take 72 mcg by mouth as needed.      meclizine (ANTIVERT) 25 MG tablet Take 25 mg by mouth 3 (three) times daily as needed for dizziness.       memantine (NAMENDA) 5 MG tablet Take 5 mg by mouth 2 (two) times daily.      Minoxidil 5 % FOAM Apply 1 application topically daily.      ondansetron (ZOFRAN) 4 MG tablet Take 1 tablet (4 mg total) by mouth daily as needed for nausea or vomiting. 20 tablet 0   OZEMPIC, 0.25 OR 0.5 MG/DOSE, 2 MG/3ML SOPN Inject 0.5 mg into the skin once a week.     pantoprazole (PROTONIX) 40 MG tablet Take 40 mg by mouth 2 (two) times daily.      pravastatin (PRAVACHOL) 20 MG tablet Take 20 mg by mouth at bedtime.      QULIPTA 60 MG TABS Take 1 tablet by mouth daily.     spironolactone (ALDACTONE) 25 MG tablet Take 25 mg by mouth daily.      varenicline (CHANTIX) 1 MG tablet Take by mouth.     Vitamin D, Ergocalciferol, (DRISDOL) 1.25 MG (50000 UNIT) CAPS capsule Take 50,000 Units by mouth once a week.     warfarin (COUMADIN) 4 MG tablet Take 4 mg by mouth 3 (three) times a week.     warfarin (COUMADIN) 7.5 MG tablet Take 1 tablet (7.5 mg total) one time only at 6 PM by mouth. 30 tablet 0   buPROPion (WELLBUTRIN SR) 150 MG 12 hr tablet      enoxaparin (LOVENOX) 120 MG/0.8ML injection Inject 120 mg into the skin in the morning and at bedtime. PT WAS INSTRUCTED TO STOP HER ASPIRIN AND COUMADIN AND START LOVENOX BRIDGE IN PREPARATION FOR HER COLONOSCOPY ON 8-20 AND HER CHOLECYSTECTOMY ON 8-24 (Patient not taking: Reported on 08/29/2022)     Galcanezumab-gnlm 120 MG/ML SOSY Inject 120 mg into the skin every 30 (thirty) days.  (Patient not taking: Reported on 07/05/2022)     predniSONE (STERAPRED UNI-PAK 21 TAB) 10 MG (21) TBPK tablet Take 6 tablets the  first day, take 5 tablets the second day, take 4 tablets the third day, take 3 tablets the fourth day, take 2 tablets the fifth day, take 1 tablet the sixth day. (Patient not taking: Reported on 08/29/2022) 21 tablet 0   No current facility-administered medications for this visit.    Review of Systems  Constitutional:  Positive for fatigue. Negative for appetite  change, chills and fever.  HENT:   Negative for voice change.        + hair loss  Eyes:  Negative for eye problems.  Respiratory:  Negative for chest tightness and cough.   Cardiovascular:  Negative for chest pain.  Gastrointestinal:  Negative for abdominal distention and abdominal pain.  Endocrine: Negative for hot flashes.  Genitourinary:  Negative for difficulty urinating and frequency.   Musculoskeletal:  Positive for arthralgias.  Skin:  Negative for itching and rash.  Neurological:  Negative for extremity weakness.  Hematological:  Negative for adenopathy.  Psychiatric/Behavioral:  Negative for confusion.    PHYSICAL EXAMINATION: ECOG PERFORMANCE STATUS: 1 - Symptomatic but completely ambulatory Vitals:   08/29/22 1123  BP: (!) 141/91  Pulse: 66  Resp: 18  Temp: (!) 97.5 F (36.4 C)   Filed Weights   08/29/22 1123  Weight: 230 lb 12.8 oz (104.7 kg)    Physical Exam Constitutional:      General: [name] not in acute distress.    Appearance: [name] obese.  HENT:     Head: Normocephalic and atraumatic.  Eyes:     General: No scleral icterus. Cardiovascular:     Rate and Rhythm: Normal rate and regular rhythm.     Heart sounds: Murmur heard.  Pulmonary:     Effort: Pulmonary effort is normal. No respiratory distress.     Breath sounds: No wheezing.  Abdominal:     General: Bowel sounds are normal. There is no distension.     Palpations: Abdomen is soft.  Musculoskeletal:        General: No deformity. Normal range of motion.     Cervical back: Normal range of motion and neck supple.  Skin:    General: Skin is warm and dry.     Findings: No erythema.  Neurological:     Mental Status: [name] alert and oriented to person, place, and time. Mental status is at baseline.     Cranial Nerves: No cranial nerve deficit.  Psychiatric:        Mood and Affect: Mood normal.     LABORATORY DATA:  I have reviewed the data as listed    Latest Ref Rng & Units 06/06/2022    11:15 PM 04/17/2021    7:58 AM 07/02/2020   10:10 PM  CBC  WBC 4.0 - 10.5 K/uL 7.3  7.0  9.2   Hemoglobin 12.0 - 15.0 g/dL 12.8  13.4  11.1   Hematocrit 36.0 - 46.0 % 41.7  39.3  34.4   Platelets 150 - 400 K/uL 261  247  189       Latest Ref Rng & Units 06/06/2022   11:15 PM 04/17/2021    7:58 AM 07/14/2020    9:35 AM  CMP  Glucose 70 - 99 mg/dL 101  126    BUN 6 - 20 mg/dL 9  8    Creatinine 0.44 - 1.00 mg/dL 0.91  0.82    Sodium 135 - 145 mmol/L 141  139    Potassium 3.5 - 5.1 mmol/L 3.5  3.7  Chloride 98 - 111 mmol/L 111  108    CO2 22 - 32 mmol/L 22  21    Calcium 8.9 - 10.3 mg/dL 9.4  9.4    Total Protein 6.5 - 8.1 g/dL   8.4   Total Bilirubin 0.3 - 1.2 mg/dL   1.6   Alkaline Phos 38 - 126 U/L   108   AST 15 - 41 U/L   23   ALT 0 - 44 U/L   21       RADIOGRAPHIC STUDIES: I have personally reviewed the radiological images as listed and agreed with the findings in the report. CT ABDOMEN PELVIS WO CONTRAST  Result Date: 07/12/2022 CLINICAL DATA:  Abdominal pain, nausea, diarrhea x2 weeks EXAM: CT ABDOMEN AND PELVIS WITHOUT CONTRAST TECHNIQUE: Multidetector CT imaging of the abdomen and pelvis was performed following the standard protocol without IV contrast. RADIATION DOSE REDUCTION: This exam was performed according to the departmental dose-optimization program which includes automated exposure control, adjustment of the mA and/or kV according to patient size and/or use of iterative reconstruction technique. COMPARISON:  04/03/2020 FINDINGS: Lower chest: Heart is enlarged in size. Dense calcification is seen in mitral annulus. Hepatobiliary: There are few low-density foci in liver each measuring less than 12 mm. Evaluation is limited in this noncontrast study. Similar finding was seen in previous MR done on 07/03/2020. There is no dilation of bile ducts. There is mild nodularity in liver surface. Pancreas: No focal abnormalities are seen. Spleen: Unremarkable. Adrenals/Urinary  Tract: Adrenals are unremarkable. There is no hydronephrosis. There are no renal or ureteral stones. Urinary bladder is not distended. Stomach/Bowel: Stomach is unremarkable. Surgical staples are seen in stomach. Small bowel loops are not dilated. The appendix is not dilated. There is no significant wall thickening in colon. There is no pericolic stranding. Vascular/Lymphatic: Scattered arterial calcifications are seen. Reproductive: Uterus is not seen. Other: There is no ascites or pneumoperitoneum. Umbilical hernia containing fat is seen. Musculoskeletal: No acute findings are seen. IMPRESSION: There is no evidence of intestinal obstruction or pneumoperitoneum. There is no hydronephrosis. Appendix is not dilated. There are few small low-density foci in liver, possibly cysts or hemangiomas. Nodularity in the liver surface suggests possible cirrhosis. Other findings as described in the body of the report. Electronically Signed   By: Elmer Picker M.D.   On: 07/12/2022 14:11   CT Head Wo Contrast  Result Date: 06/06/2022 CLINICAL DATA:  Head trauma. EXAM: CT HEAD WITHOUT CONTRAST CT CERVICAL SPINE WITHOUT CONTRAST TECHNIQUE: Multidetector CT imaging of the head and cervical spine was performed following the standard protocol without intravenous contrast. Multiplanar CT image reconstructions of the cervical spine were also generated. RADIATION DOSE REDUCTION: This exam was performed according to the departmental dose-optimization program which includes automated exposure control, adjustment of the mA and/or kV according to patient size and/or use of iterative reconstruction technique. COMPARISON:  Head CT dated 08/29/2013. FINDINGS: CT HEAD FINDINGS Brain: The ventricles and sulci are appropriate size for the patient's age. The gray-white matter discrimination is preserved. There is no acute intracranial hemorrhage. No mass effect or midline shift. No extra-axial fluid collection. Vascular: No hyperdense  vessel or unexpected calcification. Skull: Normal. Negative for fracture or focal lesion. Sinuses/Orbits: No acute finding. Other: None CT CERVICAL SPINE FINDINGS Alignment: No acute subluxation. There is straightening of normal cervical lordosis which may be positional or due to muscle spasm. Skull base and vertebrae: No acute fracture. Soft tissues and spinal canal: No prevertebral fluid or  swelling. No visible canal hematoma. Disc levels:  No acute findings.  Mild degenerative changes. Upper chest: Negative. Other: Median sternotomy wires. IMPRESSION: 1. No acute intracranial pathology. 2. No acute cervical spine fracture or subluxation. Electronically Signed   By: Anner Crete M.D.   On: 06/06/2022 23:51   CT Cervical Spine Wo Contrast  Result Date: 06/06/2022 CLINICAL DATA:  Head trauma. EXAM: CT HEAD WITHOUT CONTRAST CT CERVICAL SPINE WITHOUT CONTRAST TECHNIQUE: Multidetector CT imaging of the head and cervical spine was performed following the standard protocol without intravenous contrast. Multiplanar CT image reconstructions of the cervical spine were also generated. RADIATION DOSE REDUCTION: This exam was performed according to the departmental dose-optimization program which includes automated exposure control, adjustment of the mA and/or kV according to patient size and/or use of iterative reconstruction technique. COMPARISON:  Head CT dated 08/29/2013. FINDINGS: CT HEAD FINDINGS Brain: The ventricles and sulci are appropriate size for the patient's age. The gray-white matter discrimination is preserved. There is no acute intracranial hemorrhage. No mass effect or midline shift. No extra-axial fluid collection. Vascular: No hyperdense vessel or unexpected calcification. Skull: Normal. Negative for fracture or focal lesion. Sinuses/Orbits: No acute finding. Other: None CT CERVICAL SPINE FINDINGS Alignment: No acute subluxation. There is straightening of normal cervical lordosis which may be  positional or due to muscle spasm. Skull base and vertebrae: No acute fracture. Soft tissues and spinal canal: No prevertebral fluid or swelling. No visible canal hematoma. Disc levels:  No acute findings.  Mild degenerative changes. Upper chest: Negative. Other: Median sternotomy wires. IMPRESSION: 1. No acute intracranial pathology. 2. No acute cervical spine fracture or subluxation. Electronically Signed   By: Anner Crete M.D.   On: 06/06/2022 23:51       ASSESSMENT & PLAN:   High total IgG Protein electrophoresis showed no M protein, elevated IgG and IgA IFE showed polyclonal immunoglobulin levels, consistent with chronic inflammation.  I will repeat myeloma panel, light chain ratio, ESR, CRP.   If above work up is ok, she does not need to follow up actively with hematology.   Orders Placed This Encounter  Procedures   Multiple Myeloma Panel (SPEP&IFE w/QIG)    Standing Status:   Future    Number of Occurrences:   1    Standing Expiration Date:   08/30/2023   Kappa/lambda light chains    Standing Status:   Future    Number of Occurrences:   1    Standing Expiration Date:   08/30/2023   Sedimentation rate    Standing Status:   Future    Number of Occurrences:   1    Standing Expiration Date:   08/29/2023   C-reactive protein    Standing Status:   Future    Number of Occurrences:   1    Standing Expiration Date:   08/30/2023   ANA, IFA (with reflex)    Standing Status:   Future    Number of Occurrences:   1    Standing Expiration Date:   08/30/2023   All questions were answered. The patient knows to call the clinic with any problems, questions or concerns. cc Tracie Harrier, MD   Thank you for this kind referral and the opportunity to participate in the care of this patient. A copy of today's note is routed to referring provider   Earlie Server, MD, PhD [facility] Hematology Oncology 08/29/2022   Addendum  Labs showed negative M protein on protein electrophoresis.  Light chain  ratio is slightly elevated, this is not specific, likely due to chronic inflammation.  Elevated ESR, CRP, and positive ANA, consistent with chronic inflammation.  I recommend patient to further discuss with her primary care physician for management.  No need to follow up actively follow up with hematology clinic routinely. She may reestablish care in the future if clinically indicated.   Earlie Server, MD, PhD [facility] Hematology Oncology 09/03/2022

## 2022-08-29 NOTE — Assessment & Plan Note (Signed)
Protein electrophoresis showed no M protein, elevated IgG and IgA IFE showed polyclonal immunoglobulin levels, consistent with chronic inflammation.  I will repeat myeloma panel, light chain ratio, ESR, CRP.

## 2022-08-30 LAB — KAPPA/LAMBDA LIGHT CHAINS
Kappa free light chain: 46.7 mg/L — ABNORMAL HIGH (ref 3.3–19.4)
Kappa, lambda light chain ratio: 2.2 — ABNORMAL HIGH (ref 0.26–1.65)
Lambda free light chains: 21.2 mg/L (ref 5.7–26.3)

## 2022-08-30 LAB — FANA STAINING PATTERNS: Speckled Pattern: 24529

## 2022-08-30 LAB — ANTINUCLEAR ANTIBODIES, IFA: ANA Ab, IFA: POSITIVE — AB

## 2022-09-02 LAB — MULTIPLE MYELOMA PANEL, SERUM
Albumin SerPl Elph-Mcnc: 4 g/dL (ref 2.9–4.4)
Albumin/Glob SerPl: 1 (ref 0.7–1.7)
Alpha 1: 0.3 g/dL (ref 0.0–0.4)
Alpha2 Glob SerPl Elph-Mcnc: 0.6 g/dL (ref 0.4–1.0)
B-Globulin SerPl Elph-Mcnc: 1.3 g/dL (ref 0.7–1.3)
Gamma Glob SerPl Elph-Mcnc: 2 g/dL — ABNORMAL HIGH (ref 0.4–1.8)
Globulin, Total: 4.2 g/dL — ABNORMAL HIGH (ref 2.2–3.9)
IgA: 489 mg/dL — ABNORMAL HIGH (ref 87–352)
IgG (Immunoglobin G), Serum: 2161 mg/dL — ABNORMAL HIGH (ref 586–1602)
IgM (Immunoglobulin M), Srm: 105 mg/dL (ref 26–217)
Total Protein ELP: 8.2 g/dL (ref 6.0–8.5)

## 2022-09-06 ENCOUNTER — Telehealth: Payer: Self-pay

## 2022-09-06 NOTE — Telephone Encounter (Signed)
Patient asking about lab results from 10/19. Please advise.

## 2022-09-27 ENCOUNTER — Ambulatory Visit: Payer: Self-pay | Admitting: General Surgery

## 2022-09-28 ENCOUNTER — Ambulatory Visit: Payer: Self-pay | Admitting: General Surgery

## 2022-09-28 NOTE — H&P (Signed)
HISTORY OF PRESENT ILLNESS:    Shelly Huynh is a 51 y.o.female patient who comes for evaluation of temporal headache.  Patient has been having bilateral temporal headaches for the last 6 months.  She endorses that she has severe pressure in her temple area.  This is intermittent.  She has had vision loss described as a curtain going down on her right eye.  The lasted 2 minutes.  It resolved by itself.  She was evaluated by cardiology and she had work-up for carotid disease but it was negative.  She has also complain of numbness of the right cheek and right side of the lips.  Denies any loss of consciousness.  She has been getting treatment for migraine without Huynh improvement.  Pain does not radiate to other part of the body.  There has been no alleviating or aggravating factors.  She was consulted to general surgery for consideration of temporal artery biopsy.  Patient had elevated ESR.      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 As determined by CT scan with Dr. Nehemiah Massed (Cardiology)   COVID-19 2022   Current smoker 11/10/2016   Depression    Diabetes mellitus type 2, diet-controlled (CMS-HCC) 05/17/2016   Emphysema/COPD (CMS-HCC) 04/25/2022   Encounter for blood transfusion    Essential hypertension 09/11/2014   Fever in adult 07/08/2017   GERD (gastroesophageal reflux disease)    H/O mechanical aortic valve replacement  04/03/2015   19 mm Saint Jude aortic valve prosthesis   H/O mitral valve replacement with mechanical valve 04/03/2015   25 mm Saint Jude mechanical mitral valve prosthesis   Hepatic disease    History of tricuspid valve replacement with bioprosthetic valve 04/03/2015   29 mm Mosaic biologic tricuspid valve prosthesis   Hyperlipemia, mixed 09/11/2014   Hyperlipidemia    Hypertension    Long term current use of anticoagulants with INR goal of 2.5-3.5 05/11/2015   Major depressive disorder, recurrent, in partial remission (CMS-HCC) 06/03/2021   Mitral stenosis with insufficiency, 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   Moderate tricuspid regurgitation 01/15/2015   Obesity    OSA (obstructive sleep apnea) 10/28/2014   Postoperative anemia    Postoperative infection of wound of sternum 04/24/2015   Postoperative infection of wound of sternum, subsequent encounter 04/24/2015   Refusal of blood transfusions as patient is Jehovah's Witness 03/12/2015   Shortness of breath 10/28/2014   Sleep apnea    Tobacco abuse    Tobacco abuse         PAST SURGICAL HISTORY:   Past Surgical History:  Procedure Laterality Date   LIPOSUCTION TRUNK  1996   TUBAL LIGATION  53/9767   UMBILICAL HERNIA REPAIR  2005   HERNIA REPAIR  09/2004   HYSTERECTOMY  2010   TAH still has ovaries, [facility]   Cardiac Catherization  12/05/2014   CARDIAC VALVE REPLACEMENT  04/02/2015   REPLACEMENT AORTIC VALVE  N/A 04/03/2015   Procedure: REPLACEMENT AORTIC VALVE;  Surgeon: Elna Breslow, MD;  Location: [facility];  Service: Cardiothoracic;  Laterality: N/A;   REPLACEMENT MITRAL VALVE N/A 04/03/2015   Procedure: REPLACEMENT MITRAL VALVE;  Surgeon: Elna Breslow, MD;  Location: [facility];  Service: Cardiothoracic;  Laterality: N/A;   REPLACEMENT TRICUSPID VALVE N/A 04/03/2015   Procedure: REPLACEMENT TRICUSPID VALVE ;  Surgeon: Elna Breslow, MD;  Location: [facility];  Service: Cardiothoracic;   Laterality: N/A;   STERNOTOMY MEDIAN N/A 04/03/2015   Procedure: STERNOTOMY MEDIAN;  Surgeon: Elna Breslow, MD;  Location: [facility];  Service: Cardiothoracic;  Laterality: N/A;   BREAST EXCISIONAL BIOPSY Right 11/20/2019   Dr Lesli Albee   CHOLECYSTECTOMY  07/2020   COLONOSCOPY  09/21/2020   Tubular adenomas/Repeat 11yr/TKT   EGD  09/21/2020   Gastritis/No Repeat/[facility]KT   CESAREAN SECTION  1987 and 2000         MEDICATIONS:  Outpatient Encounter Medications as of 09/27/2022  Medication Sig Dispense Refill   albuterol 90 mcg/actuation inhaler Inhale 2 inhalations into the lungs every 6 (six) hours as needed for Wheezing for up to 180 days 6.7 g 5   amoxicillin (AMOXIL) 500 MG capsule      aspirin 81 MG EC tablet Take 1 tablet (81 mg total) by mouth once daily 90 tablet 3   atogepant 60 mg Tab Take 60 mg by mouth once daily for 360 days 30 tablet 11   blood glucose diagnostic (CONTOUR NEXT TEST STRIPS) test strip 1 each (1 strip total) 3 (three) times daily Use as instructed. 200 each 4   candesartan (ATACAND) 8 MG tablet Take 1 tablet (8 mg total) by mouth once daily for 180 days 90 tablet 3   CONTOUR NEXT METER kit by XX route as directed 1 each 0   CRANBERRY ORAL Take 2 capsules by mouth once daily     cyclobenzaprine (FLEXERIL) 10 MG tablet Take 1 tablet (10 mg total) by mouth 3 (three) times daily as needed 30 tablet 1   diazePAM (VALIUM) 5 MG tablet Take 1 tablet (5 mg total) by mouth every 12 (twelve) hours as needed for Anxiety 30 tablet 0   ergocalciferol, vitamin D2, 1,250 mcg (50,000 unit) capsule Take 1 capsule (50,000 Units total) by mouth once a week for 180 days 12 capsule 1   fluticasone propionate (FLONASE) 50 mcg/actuation nasal spray Place 2 sprays into both nostrils 2 (two) times daily 16 g 5   gabapentin (NEURONTIN) 300 MG capsule 1 po bid 180 capsule 3   galcanezumab-gnlm 120 mg/mL PnIj Inject 120 mg subcutaneously monthly 1 mL 11   galcanezumab-gnlm  120 mg/mL PnIj Inject 120 mg subcutaneously every 28 (twenty-eight) days Start 1 month after loading dose (Patient taking differently: Inject 120 mg subcutaneously every 28 (twenty-eight) days Start 1 month after loading dose (EMGALITY)) 1 mL 11   Herbal Supplement Blackseed Oil  1 teaspoon daily     HYDROcodone-acetaminophen (NORCO) 5-325 mg tablet Take 1 tablet by mouth 2 (two) times daily as needed 60 tablet 0   hydrocortisone 2.5 % cream APPLY A THIN LAYER TO FACE BID PRN FOR 1 TO 2 WEEKS     ipratropium-albuteroL (DUO-NEB) nebulizer solution Take 3 mLs by nebulization 4 (four) times daily for 360 days 360 mL 5   iron polysaccharides (POLY-IRON) 150 mg iron capsule Take 1 capsule (150 mg total) by mouth once daily 90 capsule 3  lancing device with lancets kit Use 1 each 3 (three) times daily Use as instructed. 200 each 4   levalbuterol (XOPENEX) 1.25 mg/3 mL nebulizer solution Take 3 mLs (1.25 mg total) by nebulization every 8 (eight) hours as needed for Wheezing 198 mL 1   levocetirizine (XYZAL) 5 MG tablet Take 1 tablet (5 mg total) by mouth every evening for 180 days 90 tablet 1   linaCLOtide (LINZESS) 72 mcg capsule Take 1 capsule (72 mcg total) by mouth once daily 90 capsule 1   meclizine (ANTIVERT) 25 mg tablet Take 1 tablet (25 mg total) by mouth every 6 (six) hours as needed 30 tablet 3   memantine (NAMENDA) 5 MG tablet Take 1 tablet (5 mg total) by mouth 2 (two) times daily Follow instructions given in office per after visit summary 60 tablet 11   minoxidiL 5 % Foam APPLY 1 ML TO SCALP DAILY (Patient not taking: Reported on 09/12/2022)     montelukast (SINGULAIR) 10 mg tablet Take 1 tablet (10 mg total) by mouth at bedtime 90 tablet 1   nystatin (MYCOSTATIN) 100,000 unit/gram powder Apply topically 2 (two) times daily 60 g 0   pravastatin (PRAVACHOL) 20 MG tablet Take 1 tablet (20 mg total) by mouth once daily 90 tablet 3   promethazine (PHENERGAN) 25 MG tablet Take 1 tablet (25 mg  total) by mouth every 6 (six) hours as needed for Nausea (Patient not taking: Reported on 09/12/2022) 20 tablet 0   prothrombin time test strips (COAGUCHEK XS) Strp Use 1 strip once a week 18 strip 3   semaglutide (OZEMPIC) 0.25 mg or 0.5 mg(2 mg/1.5 mL) pen injector Inject 0.375 mLs (0.5 mg total) subcutaneously once a week for 90 days 0.75 mL 5   spironolactone (ALDACTONE) 25 MG tablet Take 1 tablet (25 mg total) by mouth once daily 90 tablet 1   spironolactone (ALDACTONE) 25 MG tablet Take 1 tablet (25 mg total) by mouth once daily for 180 days Take 1 tablet (75 mg total) by mouth once daily Patient takes 50 mg plus 25 mg everyday (Patient not taking: Reported on 09/12/2022) 90 tablet 1   spironolactone (ALDACTONE) 50 MG tablet Take 1 tablet (50 mg total) by mouth once daily for 180 days Patient takes 50 mg plus 25 mg 90 tablet 1   warfarin (COUMADIN) 1 MG tablet Take 1 tablet (1 mg total) by mouth once daily 90 tablet 1   warfarin (COUMADIN) 4 MG tablet TAKE 1 TABLET(4 MG) BY MOUTH EVERY DAY 30 tablet 11   warfarin (COUMADIN) 6 MG tablet Take 2 tablets (12 mg total) by mouth once daily 90 tablet 1   warfarin (COUMADIN) 7.5 MG tablet Take 1 tablet (7.5 mg total) by mouth as directed 90 tablet 1   No facility-administered encounter medications on file as of 09/27/2022.     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  She  is married with 2 children.   Vaping Use   Vaping Use: Never used  Substance and Sexual Activity   Alcohol use: Not Currently    Alcohol/week: 0.0 standard drinks of alcohol    Comment: occasional, during holidays   Drug use: Never   Sexual activity: Yes    Partners: Male    Birth control/protection: Surgical    FAMILY HISTORY:  Family History  Problem Relation Age of Onset   Uterine cancer Mother    Thyroid disease Mother    Thyroid cancer Mother    No Known Problems Father    Arthritis Maternal Grandmother    Coronary Artery Disease (Blocked arteries around heart) Maternal Grandmother    High blood pressure (Hypertension) Maternal Grandmother    Breast cancer Paternal Grandmother    Coronary Artery Disease (Blocked arteries around heart) Paternal Grandmother    High blood pressure (Hypertension) Paternal Grandmother    Thyroid disease Paternal Grandmother    Coronary Artery Disease (Blocked arteries around heart) Other        grandparents     GENERAL REVIEW OF SYSTEMS:   General ROS: negative for - chills, fatigue, fever, weight gain or weight loss Allergy and Immunology ROS: negative for - hives  Hematological and Lymphatic ROS: negative for - bleeding problems or bruising, negative for palpable nodes Endocrine ROS: negative for - heat or cold intolerance, hair changes Respiratory ROS: negative for - cough, shortness of breath or wheezing Cardiovascular ROS: no chest pain or palpitations GI ROS: negative for nausea, vomiting, abdominal pain, diarrhea, constipation Musculoskeletal ROS: negative for - joint swelling or muscle pain Neurological ROS: negative for - confusion, syncope.  Positive for headaches Dermatological ROS: negative for pruritus and rash  PHYSICAL EXAM:  Vitals:   09/27/22 1518  BP: 115/76  Pulse: 79  .  Ht:157.5 cm (_0 ) Wt:(!) 108 kg (238 lb) MCE:YEMV surface area is 2.17 meters squared. Body mass index is 43.53 kg/m.Marland Kitchen   GENERAL: Alert, active, oriented x3  HEENT:  Pupils equal reactive to light. Extraocular movements are intact. Sclera clear. Palpebral conjunctiva normal red color.Pharynx clear.  Tenderness to palpation on the right temporal area  NECK: Supple with no palpable mass and no adenopathy.  LUNGS: Sound clear with no rales rhonchi or wheezes.  HEART: Regular rhythm S1 and S2 without murmur.  ABDOMEN: Soft and depressible, nontender with no palpable mass, no hepatomegaly. Wounds dry and clean.  EXTREMITIES: Well-developed well-nourished symmetrical with no dependent edema.  NEUROLOGICAL: Awake alert oriented, facial expression symmetrical, moving all extremities.      IMPRESSION:     Temporal headache [R51.9] -Patient with history of bilateral temporal headaches.  Mostly on the right side.  She has symptoms of vision loss on the right side.  She also have numbness on the lips of the right side.  Due to elevated ESR and her symptoms I think that is reasonable to proceed with temporal artery biopsy.  We will do right temporal artery biopsy same symptoms are most concerning of the right side even though she had headache on the left side as well.  I explained the patient about the risk of surgery includes bleeding, infection, pain.  Due to her anticoagulation she is high risk for bleeding.  I will do this in the operation room to have better control.  She understood the risk and she agreed to proceed.          PLAN:  1.  Right temporal artery biopsy (36122) 2.  Cardiology clearance 3.  Continue medical therapy for your headaches as per neurologist 4.  Contact us if you have any concern  Patient and her husband verbalized understanding, all questions were answered, and were agreeable with the plan outlined above.   Herbert Pun, MD  Electronically signed by Herbert Pun, MD

## 2022-09-28 NOTE — H&P (View-Only) (Signed)
HISTORY OF PRESENT ILLNESS:    Shelly Huynh is a 51 y.o.female patient who comes for evaluation of temporal headache.  Patient has been having bilateral temporal headaches for the last 6 months.  She endorses that she has severe pressure in her temple area.  This is intermittent.  She has had vision loss described as a curtain going down on her right eye.  The lasted 2 minutes.  It resolved by itself.  She was evaluated by cardiology and she had work-up for carotid disease but it was negative.  She has also complain of numbness of the right cheek and right side of the lips.  Denies any loss of consciousness.  She has been getting treatment for migraine without Huynh improvement.  Pain does not radiate to other part of the body.  There has been no alleviating or aggravating factors.  She was consulted to general surgery for consideration of temporal artery biopsy.  Patient had elevated ESR.      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 As determined by CT scan with Dr. Nehemiah Massed (Cardiology)   COVID-19 2022   Current smoker 11/10/2016   Depression    Diabetes mellitus type 2, diet-controlled (CMS-HCC) 05/17/2016   Emphysema/COPD (CMS-HCC) 04/25/2022   Encounter for blood transfusion    Essential hypertension 09/11/2014   Fever in adult 07/08/2017   GERD (gastroesophageal reflux disease)    H/O mechanical aortic valve replacement  04/03/2015   19 mm Saint Jude aortic valve prosthesis   H/O mitral valve replacement with mechanical valve 04/03/2015   25 mm Saint Jude mechanical mitral valve prosthesis   Hepatic disease    History of tricuspid valve replacement with bioprosthetic valve 04/03/2015   29 mm Mosaic biologic tricuspid valve prosthesis   Hyperlipemia, mixed 09/11/2014   Hyperlipidemia    Hypertension    Long term current use of anticoagulants with INR goal of 2.5-3.5 05/11/2015   Major depressive disorder, recurrent, in partial remission (CMS-HCC) 06/03/2021   Mitral stenosis with insufficiency, 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   Moderate tricuspid regurgitation 01/15/2015   Obesity    OSA (obstructive sleep apnea) 10/28/2014   Postoperative anemia    Postoperative infection of wound of sternum 04/24/2015   Postoperative infection of wound of sternum, subsequent encounter 04/24/2015   Refusal of blood transfusions as patient is Jehovah's Witness 03/12/2015   Shortness of breath 10/28/2014   Sleep apnea    Tobacco abuse    Tobacco abuse         PAST SURGICAL HISTORY:   Past Surgical History:  Procedure Laterality Date   LIPOSUCTION TRUNK  1996   TUBAL LIGATION  53/9767   UMBILICAL HERNIA REPAIR  2005   HERNIA REPAIR  09/2004   HYSTERECTOMY  2010   TAH still has ovaries, [facility]   Cardiac Catherization  12/05/2014   CARDIAC VALVE REPLACEMENT  04/02/2015   REPLACEMENT AORTIC VALVE  N/A 04/03/2015   Procedure: REPLACEMENT AORTIC VALVE;  Surgeon: Elna Breslow, MD;  Location: [facility] OPERATING ROOMS;  Service: Cardiothoracic;  Laterality: N/A;   REPLACEMENT MITRAL VALVE N/A 04/03/2015   Procedure: REPLACEMENT MITRAL VALVE;  Surgeon: Elna Breslow, MD;  Location: [facility];  Service: Cardiothoracic;  Laterality: N/A;   REPLACEMENT TRICUSPID VALVE N/A 04/03/2015   Procedure: REPLACEMENT TRICUSPID VALVE ;  Surgeon: Elna Breslow, MD;  Location: [facility];  Service: Cardiothoracic;   Laterality: N/A;   STERNOTOMY MEDIAN N/A 04/03/2015   Procedure: STERNOTOMY MEDIAN;  Surgeon: Elna Breslow, MD;  Location: [facility];  Service: Cardiothoracic;  Laterality: N/A;   BREAST EXCISIONAL BIOPSY Right 11/20/2019   Dr Lesli Albee   CHOLECYSTECTOMY  07/2020   COLONOSCOPY  09/21/2020   Tubular adenomas/Repeat 11yr/TKT   EGD  09/21/2020   Gastritis/No Repeat/TKT   CESAREAN SECTION  1987 and 2000         MEDICATIONS:  Outpatient Encounter Medications as of 09/27/2022  Medication Sig Dispense Refill   albuterol 90 mcg/actuation inhaler Inhale 2 inhalations into the lungs every 6 (six) hours as needed for Wheezing for up to 180 days 6.7 g 5   amoxicillin (AMOXIL) 500 MG capsule      aspirin 81 MG EC tablet Take 1 tablet (81 mg total) by mouth once daily 90 tablet 3   atogepant 60 mg Tab Take 60 mg by mouth once daily for 360 days 30 tablet 11   blood glucose diagnostic (CONTOUR NEXT TEST STRIPS) test strip 1 each (1 strip total) 3 (three) times daily Use as instructed. 200 each 4   candesartan (ATACAND) 8 MG tablet Take 1 tablet (8 mg total) by mouth once daily for 180 days 90 tablet 3   CONTOUR NEXT METER kit by XX route as directed 1 each 0   CRANBERRY ORAL Take 2 capsules by mouth once daily     cyclobenzaprine (FLEXERIL) 10 MG tablet Take 1 tablet (10 mg total) by mouth 3 (three) times daily as needed 30 tablet 1   diazePAM (VALIUM) 5 MG tablet Take 1 tablet (5 mg total) by mouth every 12 (twelve) hours as needed for Anxiety 30 tablet 0   ergocalciferol, vitamin D2, 1,250 mcg (50,000 unit) capsule Take 1 capsule (50,000 Units total) by mouth once a week for 180 days 12 capsule 1   fluticasone propionate (FLONASE) 50 mcg/actuation nasal spray Place 2 sprays into both nostrils 2 (two) times daily 16 g 5   gabapentin (NEURONTIN) 300 MG capsule 1 po bid 180 capsule 3   galcanezumab-gnlm 120 mg/mL PnIj Inject 120 mg subcutaneously monthly 1 mL 11   galcanezumab-gnlm  120 mg/mL PnIj Inject 120 mg subcutaneously every 28 (twenty-eight) days Start 1 month after loading dose (Patient taking differently: Inject 120 mg subcutaneously every 28 (twenty-eight) days Start 1 month after loading dose (EMGALITY)) 1 mL 11   Herbal Supplement Blackseed Oil  1 teaspoon daily     HYDROcodone-acetaminophen (NORCO) 5-325 mg tablet Take 1 tablet by mouth 2 (two) times daily as needed 60 tablet 0   hydrocortisone 2.5 % cream APPLY A THIN LAYER TO FACE BID PRN FOR 1 TO 2 WEEKS     ipratropium-albuteroL (DUO-NEB) nebulizer solution Take 3 mLs by nebulization 4 (four) times daily for 360 days 360 mL 5   iron polysaccharides (POLY-IRON) 150 mg iron capsule Take 1 capsule (150 mg total) by mouth once daily 90 capsule 3  lancing device with lancets kit Use 1 each 3 (three) times daily Use as instructed. 200 each 4   levalbuterol (XOPENEX) 1.25 mg/3 mL nebulizer solution Take 3 mLs (1.25 mg total) by nebulization every 8 (eight) hours as needed for Wheezing 198 mL 1   levocetirizine (XYZAL) 5 MG tablet Take 1 tablet (5 mg total) by mouth every evening for 180 days 90 tablet 1   linaCLOtide (LINZESS) 72 mcg capsule Take 1 capsule (72 mcg total) by mouth once daily 90 capsule 1   meclizine (ANTIVERT) 25 mg tablet Take 1 tablet (25 mg total) by mouth every 6 (six) hours as needed 30 tablet 3   memantine (NAMENDA) 5 MG tablet Take 1 tablet (5 mg total) by mouth 2 (two) times daily Follow instructions given in office per after visit summary 60 tablet 11   minoxidiL 5 % Foam APPLY 1 ML TO SCALP DAILY (Patient not taking: Reported on 09/12/2022)     montelukast (SINGULAIR) 10 mg tablet Take 1 tablet (10 mg total) by mouth at bedtime 90 tablet 1   nystatin (MYCOSTATIN) 100,000 unit/gram powder Apply topically 2 (two) times daily 60 g 0   pravastatin (PRAVACHOL) 20 MG tablet Take 1 tablet (20 mg total) by mouth once daily 90 tablet 3   promethazine (PHENERGAN) 25 MG tablet Take 1 tablet (25 mg  total) by mouth every 6 (six) hours as needed for Nausea (Patient not taking: Reported on 09/12/2022) 20 tablet 0   prothrombin time test strips (COAGUCHEK XS) Strp Use 1 strip once a week 18 strip 3   semaglutide (OZEMPIC) 0.25 mg or 0.5 mg(2 mg/1.5 mL) pen injector Inject 0.375 mLs (0.5 mg total) subcutaneously once a week for 90 days 0.75 mL 5   spironolactone (ALDACTONE) 25 MG tablet Take 1 tablet (25 mg total) by mouth once daily 90 tablet 1   spironolactone (ALDACTONE) 25 MG tablet Take 1 tablet (25 mg total) by mouth once daily for 180 days Take 1 tablet (75 mg total) by mouth once daily Patient takes 50 mg plus 25 mg everyday (Patient not taking: Reported on 09/12/2022) 90 tablet 1   spironolactone (ALDACTONE) 50 MG tablet Take 1 tablet (50 mg total) by mouth once daily for 180 days Patient takes 50 mg plus 25 mg 90 tablet 1   warfarin (COUMADIN) 1 MG tablet Take 1 tablet (1 mg total) by mouth once daily 90 tablet 1   warfarin (COUMADIN) 4 MG tablet TAKE 1 TABLET(4 MG) BY MOUTH EVERY DAY 30 tablet 11   warfarin (COUMADIN) 6 MG tablet Take 2 tablets (12 mg total) by mouth once daily 90 tablet 1   warfarin (COUMADIN) 7.5 MG tablet Take 1 tablet (7.5 mg total) by mouth as directed 90 tablet 1   No facility-administered encounter medications on file as of 09/27/2022.     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  She  is married with 2 children.   Vaping Use   Vaping Use: Never used  Substance and Sexual Activity   Alcohol use: Not Currently    Alcohol/week: 0.0 standard drinks of alcohol    Comment: occasional, during holidays   Drug use: Never   Sexual activity: Yes    Partners: Male    Birth control/protection: Surgical    FAMILY HISTORY:  Family History  Problem Relation Age of Onset   Uterine cancer Mother    Thyroid disease Mother    Thyroid cancer Mother    No Known Problems Father    Arthritis Maternal Grandmother    Coronary Artery Disease (Blocked arteries around heart) Maternal Grandmother    High blood pressure (Hypertension) Maternal Grandmother    Breast cancer Paternal Grandmother    Coronary Artery Disease (Blocked arteries around heart) Paternal Grandmother    High blood pressure (Hypertension) Paternal Grandmother    Thyroid disease Paternal Grandmother    Coronary Artery Disease (Blocked arteries around heart) Other        grandparents     GENERAL REVIEW OF SYSTEMS:   General ROS: negative for - chills, fatigue, fever, weight gain or weight loss Allergy and Immunology ROS: negative for - hives  Hematological and Lymphatic ROS: negative for - bleeding problems or bruising, negative for palpable nodes Endocrine ROS: negative for - heat or cold intolerance, hair changes Respiratory ROS: negative for - cough, shortness of breath or wheezing Cardiovascular ROS: no chest pain or palpitations GI ROS: negative for nausea, vomiting, abdominal pain, diarrhea, constipation Musculoskeletal ROS: negative for - joint swelling or muscle pain Neurological ROS: negative for - confusion, syncope.  Positive for headaches Dermatological ROS: negative for pruritus and rash  PHYSICAL EXAM:  Vitals:   09/27/22 1518  BP: 115/76  Pulse: 79  .  Ht:157.5 cm (_0 ) Wt:(!) 108 kg (238 lb) MCE:YEMV surface area is 2.17 meters squared. Body mass index is 43.53 kg/m.Marland Kitchen   GENERAL: Alert, active, oriented x3  HEENT:  Pupils equal reactive to light. Extraocular movements are intact. Sclera clear. Palpebral conjunctiva normal red color.Pharynx clear.  Tenderness to palpation on the right temporal area  NECK: Supple with no palpable mass and no adenopathy.  LUNGS: Sound clear with no rales rhonchi or wheezes.  HEART: Regular rhythm S1 and S2 without murmur.  ABDOMEN: Soft and depressible, nontender with no palpable mass, no hepatomegaly. Wounds dry and clean.  EXTREMITIES: Well-developed well-nourished symmetrical with no dependent edema.  NEUROLOGICAL: Awake alert oriented, facial expression symmetrical, moving all extremities.      IMPRESSION:     Temporal headache [R51.9] -Patient with history of bilateral temporal headaches.  Mostly on the right side.  She has symptoms of vision loss on the right side.  She also have numbness on the lips of the right side.  Due to elevated ESR and her symptoms I think that is reasonable to proceed with temporal artery biopsy.  We will do right temporal artery biopsy same symptoms are most concerning of the right side even though she had headache on the left side as well.  I explained the patient about the risk of surgery includes bleeding, infection, pain.  Due to her anticoagulation she is high risk for bleeding.  I will do this in the operation room to have better control.  She understood the risk and she agreed to proceed.          PLAN:  1.  Right temporal artery biopsy (36122) 2.  Cardiology clearance 3.  Continue medical therapy for your headaches as per neurologist 4.  Contact us if you have any concern  Patient and her husband verbalized understanding, all questions were answered, and were agreeable with the plan outlined above.   Herbert Pun, MD  Electronically signed by Herbert Pun, MD

## 2022-09-29 ENCOUNTER — Encounter
Admission: RE | Admit: 2022-09-29 | Discharge: 2022-09-29 | Disposition: A | Discharge status: Home / Self Care | Payer: 59 | Source: Ambulatory Visit | Attending: General Surgery | Admitting: General Surgery

## 2022-09-29 ENCOUNTER — Encounter: Payer: Self-pay | Admitting: General Surgery

## 2022-09-29 VITALS — Ht 63.0 in | Wt 239.0 lb

## 2022-09-29 DIAGNOSIS — I152 Hypertension secondary to endocrine disorders: Secondary | ICD-10-CM

## 2022-09-29 DIAGNOSIS — I059 Rheumatic mitral valve disease, unspecified: Secondary | ICD-10-CM

## 2022-09-29 DIAGNOSIS — Z952 Presence of prosthetic heart valve: Secondary | ICD-10-CM

## 2022-09-29 DIAGNOSIS — Z01812 Encounter for preprocedural laboratory examination: Secondary | ICD-10-CM

## 2022-09-29 DIAGNOSIS — I069 Rheumatic aortic valve disease, unspecified: Secondary | ICD-10-CM

## 2022-09-29 DIAGNOSIS — I10 Essential (primary) hypertension: Secondary | ICD-10-CM

## 2022-09-29 DIAGNOSIS — Z7901 Long term (current) use of anticoagulants: Secondary | ICD-10-CM

## 2022-09-29 DIAGNOSIS — I079 Rheumatic tricuspid valve disease, unspecified: Secondary | ICD-10-CM

## 2022-09-29 HISTORY — DX: Hyperlipidemia, unspecified: E78.5

## 2022-09-29 HISTORY — DX: Rheumatic tricuspid insufficiency: I07.1

## 2022-09-29 HISTORY — DX: Rheumatic mitral stenosis with insufficiency: I05.2

## 2022-09-29 HISTORY — DX: Emphysema, unspecified: J43.9

## 2022-09-29 HISTORY — DX: Rheumatic aortic stenosis with insufficiency: I06.2

## 2022-09-29 HISTORY — DX: Obesity, unspecified: E66.9

## 2022-09-29 NOTE — Progress Notes (Signed)
Perioperative Services Pre-Admission/Anesthesia Testing   Date: 09/29/22 Name: Shelly Huynh MRN:   [id number]  Re: Consideration of preoperative prophylactic antibiotic change   Request sent to: Herbert Pun, MD (routed and/or faxed via [facility])  Planned Surgical Procedure(s):    Case: [id number] Date/Time: 10/03/22 1240   Procedure: BIOPSY TEMPORAL ARTERY (Head)   Anesthesia type: General   Pre-op diagnosis: R51.9 Temporal headache   Location: [facility] OR ROOM 06 / [place] ORS FOR ANESTHESIA GROUP   Surgeons: Herbert Pun, MD   Clinical Notes:  Patient has a documented allergy/intolerance to CEPHALEXIN  Advising that this has caused her to experience abdominal pain in the past.   EMR review indicated that patient received PCN and/or cephalosporin in the past as follows: CEFUROXIME received on 04/03/2015 with no documented ADRs.  CEFAZOLIN received on 09/07/2017, 11/20/2019, and 07/14/2020 with no documented ADRs.   Screened as appropriate for cephalosporin use during medication reconciliation No immediate angioedema, dysphagia, SOB, anaphylaxis symptoms. No severe rash involving mucous membranes or skin necrosis. No hospital admissions related to side effects of PCN/cephalosporin use.  No documented reaction to PCN or cephalosporin in the last 10 years.  Request:  As an evidence based approach to reducing the rate of incidence for post-operative SSI and the development of MDROs, could an agent that allows for narrower antimicrobial coverage for preoperative prophylaxis in this patient's upcoming surgical course be considered?   Currently ordered preoperative prophylactic ABX: ciprofloxacin.   Specifically requesting change to cephalosporin (CEFAZOLIN).  Drug of choice for many procedures; it is the most widely studied antimicrobial agent with proven efficacy for antimicrobial prophylaxis.   Desirable duration of action, spectrum of activity against organisms  commonly encountered in surgery, and it has an excellent safety profile and low cost.   Active against streptococci, methicillin-susceptible staphylococci, and many gram-negative organisms.  Please communicate decision with me and I will change the orders in Epic as per your direction.   Things to consider: Many patients report that they were "allergic" to PCN earlier in life, however this does not translate into a true lifelong allergy. Patients can lose sensitivity to specific IgE antibodies over time if PCN is avoided (Kleris & Lugar, 2019).  Up to 10% of the adult population and 15% of hospitalized patients report an allergy to PCN, however clinical studies suggest that 90% of those reporting an allergy can tolerate PCN antibiotics (Kleris & Lugar, 2019).  Cross-sensitivity between PCN and cephalosporins has been documented as being as high as 10%, however this estimation included data believed to have been collected in a setting where there was contamination. Newer data suggests that the prevalence of cross-sensitivity between PCN and cephalosporins is actually estimated to be closer to 1% (Hermanides et al., 2018).   Patients labeled as PCN allergic, whether they are truly allergic or not, have been found to have inferior outcomes in terms of rates of serious infection, and these patients tend to have longer hospital stays ([place], 2019).  Treatment related secondary infections, such as Clostridioides difficile, have been linked to the improper use of broad spectrum antibiotics in patients improperly labeled as PCN allergic (Kleris & Lugar, 2019).  Anaphylaxis from cephalosporins is rare and the evidence suggests that there is no increased risk of an anaphylactic type reaction when cephalosporins are used in a PCN allergic patient (Pichichero, 2006).  Citations: Hermanides J, Lemkes BA, Prins Pearla Dubonnet MW, [place]reehorst I. Presumed ?-Lactam Allergy and Cross-reactivity in the Operating  Theater: A Practical Approach.  Anesthesiology. 2018 Aug;129(2):335-342. doi: 10.1097/ALN.0000000000002252. PMID: 47654650.  Kleris, [employer]., & Lugar, P. L. (2019). Things We Do For No Reason: Failing to Question a Penicillin Allergy History. Journal of hospital medicine, 14(10), [phone]. Advance online publication. https://www.wallace-middleton.info/  Pichichero, M. E. (2006). Cephalosporins can be prescribed safely for penicillin-allergic patients. Journal of family medicine, 55(2), 106-112. Accessed: https://cdn.mdedge.com/files/s68f-public/Document/September-2017/5502JFP_AppliedEvidence1.pdf   BHonor Loh MSN, APRN, FNP-C, CEN [facility] Peri-[facility] Services Nurse Practitioner FAX: ([phone]/09/23 12:30 PM

## 2022-09-29 NOTE — Progress Notes (Addendum)
  Perioperative Services Pre-Admission/Anesthesia Testing     Date: 09/29/22  Name: RAYNEE MCCASLAND MRN:   [id number]  Re: Change in [place] for upcoming surgery   Case: [id number] Date/Time: 10/03/22 1240   Procedure: BIOPSY TEMPORAL ARTERY (Head)   Anesthesia type: General   Pre-op diagnosis: R51.9 Temporal headache   Location: [facility] OR ROOM 06 / [place] ORS FOR ANESTHESIA GROUP   Surgeons: Herbert Pun, MD   Primary attending surgeon was consulted regarding consideration of therapeutic change in antimicrobial agent being used for preoperative prophylaxis in this patient's upcoming surgical case. Following analysis of the risk versus benefits, the patient's primary attending surgeon advised that it would be acceptable to discontinue the ordered ciprofloxacin and place an order for cefazolin 2 gm IV on call to the OR. Orders for this patient were amended by me following collaborative conversation with attending surgeon taking into consideration of risk versus benefits associated with the change in therapy.  Honor Loh, MSN, APRN, FNP-C, CEN [facility]  Peri-operative Services Nurse Practitioner Phone: [phone] 09/29/22 1:01 PM

## 2022-09-29 NOTE — Patient Instructions (Addendum)
Your procedure is scheduled on: Monday, November 13 Report to the Registration Desk on the 1st floor of the [facility]. To find out your arrival time, please call [phone] between 1PM - 3PM on: Friday, November 10 If your arrival time is 6:00 am, do not arrive prior to that time as the [place] entrance doors do not open until 6:00 am.  REMEMBER: Instructions that are not followed completely may result in serious medical risk, up to and including death; or upon the discretion of your surgeon and anesthesiologist your surgery may need to be rescheduled.  Do not eat or drink after midnight the night before surgery.  No gum chewing, lozengers or hard candies.  TAKE THESE MEDICATIONS THE MORNING OF SURGERY WITH A SIP OF WATER:  Amoxicillin Gabapentin Duoneb nebulizer  Ozempic - hold for 7 days before surgery. Do not take the dose on Sunday, November 12. Resume AFTER surgery.  Per Dr. Alveria Apley instructions: Coumadin/Lovenox bridge  One week prior to surgery: starting today, November 9 Stop Anti-inflammatories (NSAIDS) such as Advil, Aleve, Ibuprofen, Motrin, Naproxen, Naprosyn and Aspirin based products such as Excedrin, Goodys Powder, BC Powder. Stop ANY OVER THE COUNTER supplements until after surgery. You may however, continue to take Tylenol if needed for pain up until the day of surgery.  No Alcohol for 24 hours before or after surgery.  No Smoking including e-cigarettes for 24 hours prior to surgery.  No chewable tobacco products for at least 6 hours prior to surgery.  No nicotine patches on the day of surgery.  Do not use any "recreational" drugs for at least a week prior to your surgery.  Please be advised that the combination of cocaine and anesthesia may have negative outcomes, up to and including death. If you test positive for cocaine, your surgery will be cancelled.  On the morning of surgery brush your teeth with toothpaste and water, you may rinse your  mouth with mouthwash if you wish. Do not swallow any toothpaste or mouthwash.  Do not wear jewelry, make-up, hairpins, clips or nail polish.  Do not wear lotions, powders, or perfumes.   Do not shave body from the neck down 48 hours prior to surgery just in case you cut yourself which could leave a site for infection.   Contact lenses, hearing aids and dentures may not be worn into surgery.  Do not bring valuables to the hospital. [facility] is not responsible for any missing/lost belongings or valuables.   Bring your C-PAP to the hospital with you in case you may have to spend the night.   Notify your doctor if there is any change in your medical condition (cold, fever, infection).  Wear comfortable clothing (specific to your surgery type) to the hospital.  After surgery, you can help prevent lung complications by doing breathing exercises.  Take deep breaths and cough every 1-2 hours. Your doctor may order a device called an Incentive Spirometer to help you take deep breaths.  If you are being discharged the day of surgery, you will not be allowed to drive home. You will need a responsible adult (18 years or older) to drive you home and stay with you that night.   If you are taking public transportation, you will need to have a responsible adult (18 years or older) with you. Please confirm with your physician that it is acceptable to use public transportation.   Please call the [place] Dept. at [phone] if you have any questions  about these instructions.  Surgery Visitation Policy:  Patients undergoing a surgery or procedure may have two family members or support persons with them as long as the person is not COVID-19 positive or experiencing its symptoms.

## 2022-09-29 NOTE — Progress Notes (Signed)
Perioperative Services  Pre-Admission/Anesthesia Testing Clinical Review  Date: 09/30/22  Patient Demographics:  Name: Shelly Huynh DOB:   04/14/1971 MRN:   [id number]  Planned Surgical Procedure(s):    Case: [id number] Date/Time: 10/03/22 1240   Procedure: BIOPSY TEMPORAL ARTERY (Head)   Anesthesia type: General   Pre-op diagnosis: R51.9 Temporal headache   Location: [facility] OR ROOM 06 / [facility]   Surgeons: Herbert Pun, MD   NOTE: Available PAT nursing documentation and vital signs have been reviewed. Clinical nursing staff has updated patient's PMH/PSHx, current medication list, and drug allergies/intolerances to ensure comprehensive history available to assist in medical decision making as it pertains to the aforementioned surgical procedure and anticipated anesthetic course. Extensive review of available clinical information performed. [place] PMH and PSHx updated with any diagnoses/procedures that  may have been inadvertently omitted during her intake with the pre-admission testing department's nursing staff.  Clinical Discussion:  Shelly Huynh is a 51 y.o. female who is submitted for pre-surgical anesthesia review and clearance prior to her undergoing the above procedure. Patient is a Current Smoker (12.5 pack years). Pertinent PMH includes: CAD, CHF, valvular cardiomyopathy (s/p AVR/MVR/TVR), cardiomegaly, paroxysmal atrial flutter, HTN, HLD, T2DM, DOE, asthma, emphysema, GERD (no daily Tx), hiatal hernia, OSAH (requires nocturnal PAP therapy), anemia, hyperaldosteronism, cirrhosis/NASH, recurrent headaches, sciatica, lumbar facet syndrome, chronic lower back pain, OA, depression, anxiety (on BZO).   Patient is followed by cardiology Nehemiah Massed, MD). She was last seen in the cardiology clinic on 04/21/2022; notes reviewed.  At the time of her clinic visit, patient reporting episodes of nonspecific chest pain with associated shortness of breath  both with exertion and at rest.  Symptoms relieved by position changes.  She denies any PND, orthopnea, palpitations, significant peripheral edema, vertiginous symptoms, or presyncope/syncope.  Patient with a past medical history significant for cardiovascular diagnoses.  Diagnostic LEFT heart catheterization performed on 12/05/2014 here at [facility] revealed normal left ventricular systolic function.  There was normal coronary anatomy with no evidence of obstructive CAD.  Patient has a history of valvular cardiomyopathy.  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  Patient ultimately underwent replacement of her aortic, mitral, and tricuspid valves on 04/03/2015.  19 mm St. Jude mechanical aortic valve 25 mm St. Jude mechanical mitral valve 21 mm Mosaic porcine tricuspid valve  Most recent TTE was performed on 08/13/2021 revealing a normal left ventricular systolic function with moderate concentric LVH; LVEF >55%. Left ventricular diastolic Doppler parameters consistent with a restrictive filling pattern (G3DD).  Left atrium moderately enlarged and right atrium MILDLY enlarged.  Right ventricle also noted to be mildly enlarged.  Mechanical aortic valve well-seated and functioning properly; mean transvalvular gradient 23.1 mmHg.  Bioprosthetic mechanical mitral valve also well-seated with normal function; mean transvalvular gradient 4.5 mmHg.  Bioprosthetic tricuspid valve in normal position and functioning normally; peak TR velocity to 11.1 cm/sec.  Most recent myocardial perfusion imaging study was performed on 04/13/2022 revealing a normal left ventricular systolic function with an EF of 64%.  There was normal regional wall motion.  There was no evidence of stress-induced myocardial ischemia or arrhythmia; no scintigraphic evidence of scar.  Study determined to be normal and low risk overall.  Following  valve replacements, patient remains on daily oral anticoagulation therapy using warfarin.  Patient is reportedly compliant with therapy with no evidence or reports of GI bleeding.  Blood pressure well controlled at 122/70  mmHg on currently prescribed ARB (candesartan) and diuretic (spironolactone) therapies.  Patient is on pravastatin for her HLD diagnosis and ASCVD prevention.  T2DM well-controlled on currently prescribed regimen; last HgbA1c was 6.3% when checked on 08/03/2022.  Patient does have an OSAH diagnosis and is reported to be compliant with prescribed nocturnal PAP therapy.  Functional capacity limited by obesity and multiple medical comorbidities.  With that being said, patient still felt to be able to achieve at least 4 METS of physical activity without experiencing any degree of angina/anginal equivalent symptoms.  No changes were made to her medication regimen.  Patient to follow-up with outpatient cardiology and 6 months or sooner if needed.  Shelly Huynh is scheduled for an BIOPSY TEMPORAL ARTERY on 10/03/2022 with Dr. Herbert Pun, MD.  Given patient's past medical history significant for cardiovascular diagnoses, presurgical cardiac clearance was sought by the PAT team. Per cardiology, "this patient is optimized for surgery and may proceed with the planned procedural course with a LOW risk of significant perioperative cardiovascular complications".  Again, patient is on daily oral anticoagulation therapy using warfarin.  She has been instructed on recommendations for holding her warfarin dose prior to her procedure.  She has been provided with instructions on enoxaparin bridging by her cardiologist.  Patient's last dose of warfarin will be on 09/28/2022.    Patient denies previous perioperative complications with anesthesia in the past. In review of the available records, it is noted that patient underwent a general anesthetic course here at [facility] (ASA III) in 09/2020 without documented complications.      09/29/2022   11:26 AM 08/29/2022   11:23 AM 07/05/2022    2:07 PM  Vitals with BMI  Height 5' 3"   5' 3"   Weight 239 lbs 230 lbs 13 oz 237 lbs 5 oz  BMI 38.88  28.00  Systolic  349 179  Diastolic  91 79  Pulse  66 98    Providers/Specialists:   NOTE: Primary physician provider listed below. Patient may have been seen by APP or partner within same practice.   PROVIDER ROLE / SPECIALTY LAST Tanna Savoy, MD General Surgery (Surgeon) 09/27/2022  Tracie Harrier, MD Primary Care Provider 08/10/2022  Serafina Royals, MD Cardiology 04/21/2022  Jennings Books, MD Neurology 08/23/2022   Allergies:  Ivp dye [iodinated contrast media], Sulfa antibiotics, Banana, Guaifenesin, Hctz [hydrochlorothiazide], Keflex [cephalexin], Losartan, Povidone iodine, Zofran [ondansetron], Lisinopril, and Mucinex [guaifenesin er]  Current Home Medications:   No current facility-administered medications for this encounter.    albuterol (VENTOLIN HFA) 108 (90 Base) MCG/ACT inhaler   amoxicillin (AMOXIL) 500 MG capsule   aspirin EC 81 MG tablet   candesartan (ATACAND) 8 MG tablet   Cranberry-Vitamin C (CRANBERRY CONCENTRATE/VITAMINC PO)   cyclobenzaprine (FLEXERIL) 10 MG tablet   diazepam (VALIUM) 5 MG tablet   enoxaparin (LOVENOX) 120 MG/0.8ML injection   fluticasone (FLONASE) 50 MCG/ACT nasal spray   gabapentin (NEURONTIN) 300 MG capsule   Galcanezumab-gnlm (EMGALITY [place])   Galcanezumab-gnlm 120 MG/ML SOSY   HYDROcodone-acetaminophen (NORCO/VICODIN) 5-325 MG tablet   hydrocortisone 2.5 % cream   ipratropium-albuterol (DUONEB) 0.5-2.5 (3) MG/3ML SOLN   iron polysaccharides (NIFEREX) 150 MG capsule   levalbuterol (XOPENEX) 1.25 MG/0.5ML nebulizer solution   levocetirizine (XYZAL) 5 MG tablet   linaclotide (LINZESS) 72 MCG capsule   meclizine (ANTIVERT) 25 MG tablet   memantine (NAMENDA) 5 MG tablet   montelukast  (SINGULAIR) 10 MG tablet   nystatin (MYCOSTATIN/NYSTOP)  powder   OZEMPIC, 0.25 OR 0.5 MG/DOSE, 2 MG/3ML SOPN   pravastatin (PRAVACHOL) 20 MG tablet   spironolactone (ALDACTONE) 25 MG tablet   spironolactone (ALDACTONE) 50 MG tablet   Vitamin D, Ergocalciferol, (DRISDOL) 1.25 MG (50000 UNIT) CAPS capsule   warfarin (COUMADIN) 1 MG tablet   warfarin (COUMADIN) 4 MG tablet   warfarin (COUMADIN) 6 MG tablet   warfarin (COUMADIN) 7.5 MG tablet   History:   Past Medical History:  Diagnosis Date   Anemia    Anxiety    a.) on BZO (diazepam) PRN   Aortic atherosclerosis (HCC)    Aortic valve stenosis and insufficiency, rheumatic    a.) s/p AVR 04/03/2015 - 19 mm St. Jude mechanical valve   Arthritis    Asthma    ATN (acute tubular necrosis) (HCC)    Cardiomegaly    CHF (congestive heart failure) ([place])    a.) TTE 08/13/2022: EF >55%, mod LAE, mild RV/RAE, triv-mild panvalvular regurgitation, G3DD.   Chronic anticoagulation    Chronic constipation    a.) on linaclotide   Chronic lower back pain    Cirrhosis (HCC)    Coronary artery calcification seen on CT scan    a.) LHC 12/05/2014: normal coronaries   Depression    DOE (dyspnea on exertion)    Emphysema of lung (HCC)    GERD (gastroesophageal reflux disease)    Helicobacter pylori infection    History of hiatal hernia    History of methicillin resistant staphylococcus aureus (MRSA)    HTN (hypertension)    Hyperaldosteronism (HCC)    Hyperlipidemia    Hypertension    IBS (irritable bowel syndrome)    Long term current use of anticoagulant    a.) warfarin   Lumbar facet joint syndrome    Migraines    Mitral stenosis with insufficiency, rheumatic    a.) s/p MVR 04/03/2015 - 25 mm St. Jude mechanical valve   Moderate tricuspid regurgitation    a.) s/p TVR 04/03/2015 - 21 mm Mosaic porcine valve   NASH (nonalcoholic steatohepatitis)    Obesity    OSA on CPAP    Palpitations    Paroxysmal atrial flutter ([place])    a.)  CHA2DS2VASc = 5 (sex, CHF, HTN, vascular disease history, T2DM);  b.) rate/rhythm maintained without pharmacological intervention; chronically anticoagulated with warfarin   Sciatic pain    T2DM (type 2 diabetes mellitus) ([place])    Umbilical hernia    a.) s/p repair 09/2004   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[place])    a.) s/p aortic, mitral, and tricuspid valve replacements 04/03/2015   Vitamin D deficiency    Past Surgical History:  Procedure Laterality Date   ABDOMINAL HYSTERECTOMY  2010   AORTIC VALVE REPLACEMENT  04/03/2015   Procedure: AORTIC VALVE REPLACEMENT (19 mm St. Jude [place] mechanical valve); Location: [facility]; Surgeon: Monia Pouch, MD   BREAST BIOPSY Left 07/25/2017   BENIGN BREAST TISSUE WITH MICROCALCIFICATIONS   BREAST BIOPSY Right 10/22/2019   Affirm Bx- X-clip- CSL, no atypia   BREAST BIOPSY Right 10/22/2019   Affirm Bx- Ribbon clip- CSL, no atypia   BREAST BIOPSY Right 11/20/2019   Procedure: BREAST BIOPSY WITH NEEDLE LOCALIZATION x 2;  Surgeon: Herbert Pun, MD;  Location: [facility] ORS;  Service: General;  Laterality: Right;   BREAST EXCISIONAL BIOPSY Right 1992   NEG   BREAST LUMPECTOMY Right 11/20/2019   complex sclerosing lesion x 2   CESAREAN SECTION N/A 2000   [place] N/A 1987  CHOLECYSTECTOMY  07/2020   COLONOSCOPY WITH PROPOFOL N/A 09/21/2020   Procedure: COLONOSCOPY WITH PROPOFOL;  Surgeon: [place], Benay Pike, MD;  Location: [facility];  Service: Gastroenterology;  Laterality: N/A;   ESOPHAGOGASTRODUODENOSCOPY (EGD) WITH PROPOFOL N/A 10/07/2016   Procedure: ESOPHAGOGASTRODUODENOSCOPY (EGD) WITH PROPOFOL;  Surgeon: Jonathon Bellows, MD;  Location: [facility];  Service: Endoscopy;  Laterality: N/A;   ESOPHAGOGASTRODUODENOSCOPY (EGD) WITH PROPOFOL N/A 09/21/2020   Procedure: ESOPHAGOGASTRODUODENOSCOPY (EGD) WITH PROPOFOL;  Surgeon: [place], Benay Pike, MD;  Location: [facility];  Service: Gastroenterology;  Laterality: N/A;   LAPAROSCOPIC GASTRIC  BAND REMOVAL WITH LAPAROSCOPIC GASTRIC SLEEVE RESECTION     LEFT HEART CATH AND CORONARY ANGIOGRAPHY Left 12/05/2014   Procedure: LEFT HEART CATH AND CORONARY ANGIOGRAPHY; Location: [place]; Surgeon: Serafina Royals, MD   LIPOSUCTION TRUNK  1996   MITRAL VALVE REPLACEMENT  04/03/2015   Procedure: MITRAL VALVE REPLACEMENT (25 mm St. Jude Mechanical valve); Location: [facility]; Surgeon: Monia Pouch, MD   TRICUSPID VALVE REPLACEMENT  04/03/2015   Procedure: TRICUSPID VALVE REPLACEMENT (21 mm Mosaic porcine valve); Location: [facility]; Surgeon: Monia Pouch, MD   TUBAL LIGATION  7209   UMBILICAL HERNIA REPAIR  2005   VENTRAL HERNIA REPAIR     Family History  Problem Relation Age of Onset   Thyroid cancer Mother    Uterine cancer Mother    Breast cancer Paternal Grandmother    Lung cancer Paternal Grandmother    Pancreatic cancer Cousin    Social History   Tobacco Use   Smoking status: Every Day    Packs/day: 0.50    Years: 25.00    Total pack years: 12.50    Types: Cigarettes   Smokeless tobacco: Never  Vaping Use   Vaping Use: Never used  Substance Use Topics   Alcohol use: No   Drug use: No    Pertinent Clinical Results:  LABS: Labs reviewed: Acceptable for surgery.  Lab Results  Component Value Date   NA 138 09/30/2022   K 3.8 09/30/2022   CO2 22 09/30/2022   GLUCOSE 115 (H) 09/30/2022   BUN 12 09/30/2022   CREATININE 0.88 09/30/2022   CALCIUM 9.6 09/30/2022   GFRNONAA >60 09/30/2022       ECG: Date: 09/30/2022 Time ECG obtained: 0903 AM Rate: 61 bpm Rhythm: normal sinus Axis (leads I and aVF): Normal Intervals: PR 152 ms. QRS 92 ms. QTc 422 ms. ST segment and T wave changes: No evidence of acute ST segment elevation or depression Comparison: Similar to previous tracing obtained on 07/07/2021   IMAGING / PROCEDURES: MRI ABDOMEN WITH AND WITHOUT CONTRAST performed on 04/14/2022 No suspicious hepatic lesions  Hepatic cirrhosis without imaging findings of portal  hypertension No significant extrahepatic findings  MYOCARDIAL PERFUSION IMAGING STUDY (LEXISCAN) performed on 04/13/2022 Normal left ventricular systolic function with a normal LVEF of 64% Normal myocardial thickening and wall motion Left ventricular cavity size normal SPECT images demonstrate homogenous tracer distribution throughout the myocardium No evidence of stress-induced myocardial ischemia or arrhythmia Normal low risk study  CT ABDOMEN PELVIS WO CONTRAST performed on 07/12/2022 There is no evidence of intestinal obstruction or pneumoperitoneum. There is no hydronephrosis.  Appendix is not dilated. There are few small low-density foci in liver, possibly cysts or hemangiomas.  Nodularity in the liver surface suggests possible cirrhosis.  TRANSTHORACIC ECHOCARDIOGRAM performed on 08/13/2021 Normal left ventricular systolic function with moderate LVH; LVEF >55% Left ventricular diastolic Doppler parameters consistent with a restrictive filling pattern (G3DD). Left atrium moderately enlarged right atrium mildly  enlarged Right ventricle mildly enlarged Mechanical prosthetic aortic valve well-seated and functioning properly; mean gradient 23.1 mmHg Mechanical prosthetic mitral valve well-seated and functioning properly; mean gradient 4.5 mmHg Bioprosthetic tricuspid valve well-seated and functioning properly Trivial AR Mild MR, TR, PR No pericardial effusion  Impression and Plan:  Shelly Huynh has been referred for pre-anesthesia review and clearance prior to her undergoing the planned anesthetic and procedural courses. Available labs, pertinent testing, and imaging results were personally reviewed by me. This patient has been appropriately cleared by cardiology with an overall LOW risk of significant perioperative cardiovascular complications.  Based on clinical review performed today (09/30/22), barring any significant acute changes in the patient's overall condition, it is  anticipated that she will be able to proceed with the planned surgical intervention. Any acute changes in clinical condition may necessitate her procedure being postponed and/or cancelled. Patient will meet with anesthesia team (MD and/or CRNA) on the day of her procedure for preoperative evaluation/assessment. Questions regarding anesthetic course will be fielded at that time.   Pre-surgical instructions were reviewed with the patient during her PAT appointment and questions were fielded by PAT clinical staff. Patient was advised that if any questions or concerns arise prior to her procedure then she should return a call to PAT and/or her surgeon's office to discuss.  Honor Loh, MSN, APRN, FNP-C, CEN [facility]  Peri-operative Services Nurse Practitioner Phone: [phone] Fax: [phone] 09/30/22 10:41 AM  NOTE: This note has been prepared using Dragon dictation software. Despite my best ability to proofread, there is always the potential that unintentional transcriptional errors may still occur from this process.

## 2022-09-30 ENCOUNTER — Other Ambulatory Visit: Payer: Self-pay

## 2022-09-30 ENCOUNTER — Encounter
Admission: RE | Admit: 2022-09-30 | Discharge: 2022-09-30 | Disposition: A | Discharge status: Home / Self Care | Payer: 59 | Source: Ambulatory Visit | Attending: General Surgery | Admitting: General Surgery

## 2022-09-30 DIAGNOSIS — Z01818 Encounter for other preprocedural examination: Secondary | ICD-10-CM | POA: Diagnosis present

## 2022-09-30 DIAGNOSIS — I10 Essential (primary) hypertension: Secondary | ICD-10-CM | POA: Insufficient documentation

## 2022-09-30 DIAGNOSIS — Z01812 Encounter for preprocedural laboratory examination: Secondary | ICD-10-CM

## 2022-09-30 LAB — BASIC METABOLIC PANEL
Anion gap: 8 (ref 5–15)
BUN: 12 mg/dL (ref 6–20)
CO2: 22 mmol/L (ref 22–32)
Calcium: 9.6 mg/dL (ref 8.9–10.3)
Chloride: 108 mmol/L (ref 98–111)
Creatinine, Ser: 0.88 mg/dL (ref 0.44–1.00)
GFR, Estimated: >60 mL/min (ref >60)
Glucose, Bld: 115 mg/dL — ABNORMAL HIGH (ref 70–99)
Potassium: 3.8 mmol/L (ref 3.5–5.1)
Sodium: 138 mmol/L (ref 135–145)

## 2022-10-02 MED ORDER — CEFAZOLIN SODIUM-DEXTROSE 2-4 GM/100ML-% IV SOLN
2.0000 g | Freq: Once | INTRAVENOUS | Status: AC
  Administered 2022-10-03: 2 g via INTRAVENOUS

## 2022-10-02 MED ORDER — SODIUM CHLORIDE 0.9 % IV SOLN: INTRAVENOUS | Status: DC

## 2022-10-02 MED ORDER — FAMOTIDINE 20 MG PO TABS: 20.0000 mg | ORAL_TABLET | Freq: Once | ORAL | Status: AC

## 2022-10-02 MED ORDER — CHLORHEXIDINE GLUCONATE 0.12 % MT SOLN: 15.0000 mL | Freq: Once | OROMUCOSAL | Status: AC

## 2022-10-02 MED ORDER — ORAL CARE MOUTH RINSE: 15.0000 mL | Freq: Once | OROMUCOSAL | Status: AC

## 2022-10-03 ENCOUNTER — Encounter
Admission: RE | Disposition: A | Discharge status: Home / Self Care | Payer: Self-pay | Source: Home / Self Care | Attending: General Surgery

## 2022-10-03 ENCOUNTER — Ambulatory Visit
Admission: RE | Admit: 2022-10-03 | Discharge: 2022-10-03 | Disposition: A | Discharge status: Home / Self Care | Payer: 59 | Attending: General Surgery | Admitting: General Surgery

## 2022-10-03 ENCOUNTER — Other Ambulatory Visit: Payer: Self-pay

## 2022-10-03 ENCOUNTER — Ambulatory Visit: Payer: 59 | Admitting: Urgent Care

## 2022-10-03 ENCOUNTER — Encounter: Payer: Self-pay | Admitting: General Surgery

## 2022-10-03 DIAGNOSIS — I4891 Unspecified atrial fibrillation: Secondary | ICD-10-CM | POA: Diagnosis not present

## 2022-10-03 DIAGNOSIS — Z7901 Long term (current) use of anticoagulants: Secondary | ICD-10-CM

## 2022-10-03 DIAGNOSIS — I152 Hypertension secondary to endocrine disorders: Secondary | ICD-10-CM

## 2022-10-03 DIAGNOSIS — I2510 Atherosclerotic heart disease of native coronary artery without angina pectoris: Secondary | ICD-10-CM | POA: Insufficient documentation

## 2022-10-03 DIAGNOSIS — I082 Rheumatic disorders of both aortic and tricuspid valves: Secondary | ICD-10-CM | POA: Insufficient documentation

## 2022-10-03 DIAGNOSIS — F329 Major depressive disorder, single episode, unspecified: Secondary | ICD-10-CM | POA: Insufficient documentation

## 2022-10-03 DIAGNOSIS — I079 Rheumatic tricuspid valve disease, unspecified: Secondary | ICD-10-CM

## 2022-10-03 DIAGNOSIS — E785 Hyperlipidemia, unspecified: Secondary | ICD-10-CM | POA: Insufficient documentation

## 2022-10-03 DIAGNOSIS — G4733 Obstructive sleep apnea (adult) (pediatric): Secondary | ICD-10-CM | POA: Insufficient documentation

## 2022-10-03 DIAGNOSIS — K219 Gastro-esophageal reflux disease without esophagitis: Secondary | ICD-10-CM | POA: Diagnosis not present

## 2022-10-03 DIAGNOSIS — R519 Headache, unspecified: Secondary | ICD-10-CM | POA: Diagnosis present

## 2022-10-03 DIAGNOSIS — E669 Obesity, unspecified: Secondary | ICD-10-CM | POA: Diagnosis not present

## 2022-10-03 DIAGNOSIS — F17200 Nicotine dependence, unspecified, uncomplicated: Secondary | ICD-10-CM | POA: Insufficient documentation

## 2022-10-03 DIAGNOSIS — I4892 Unspecified atrial flutter: Secondary | ICD-10-CM | POA: Diagnosis not present

## 2022-10-03 DIAGNOSIS — F419 Anxiety disorder, unspecified: Secondary | ICD-10-CM | POA: Insufficient documentation

## 2022-10-03 DIAGNOSIS — I10 Essential (primary) hypertension: Secondary | ICD-10-CM

## 2022-10-03 DIAGNOSIS — J439 Emphysema, unspecified: Secondary | ICD-10-CM | POA: Diagnosis not present

## 2022-10-03 DIAGNOSIS — M1990 Unspecified osteoarthritis, unspecified site: Secondary | ICD-10-CM | POA: Insufficient documentation

## 2022-10-03 DIAGNOSIS — I509 Heart failure, unspecified: Secondary | ICD-10-CM | POA: Insufficient documentation

## 2022-10-03 DIAGNOSIS — E269 Hyperaldosteronism, unspecified: Secondary | ICD-10-CM | POA: Insufficient documentation

## 2022-10-03 DIAGNOSIS — Z952 Presence of prosthetic heart valve: Secondary | ICD-10-CM | POA: Diagnosis not present

## 2022-10-03 DIAGNOSIS — Z01812 Encounter for preprocedural laboratory examination: Secondary | ICD-10-CM

## 2022-10-03 DIAGNOSIS — I069 Rheumatic aortic valve disease, unspecified: Secondary | ICD-10-CM

## 2022-10-03 DIAGNOSIS — K7460 Unspecified cirrhosis of liver: Secondary | ICD-10-CM | POA: Insufficient documentation

## 2022-10-03 DIAGNOSIS — E119 Type 2 diabetes mellitus without complications: Secondary | ICD-10-CM | POA: Diagnosis not present

## 2022-10-03 DIAGNOSIS — I110 Hypertensive heart disease with heart failure: Secondary | ICD-10-CM | POA: Diagnosis not present

## 2022-10-03 DIAGNOSIS — I059 Rheumatic mitral valve disease, unspecified: Secondary | ICD-10-CM

## 2022-10-03 DIAGNOSIS — Z6841 Body Mass Index (BMI) 40.0 and over, adult: Secondary | ICD-10-CM | POA: Diagnosis not present

## 2022-10-03 HISTORY — DX: Unspecified atrial flutter: I48.92

## 2022-10-03 HISTORY — PX: ARTERY BIOPSY: SHX891

## 2022-10-03 HISTORY — DX: Long term (current) use of anticoagulants: Z79.01

## 2022-10-03 HISTORY — DX: Type 2 diabetes mellitus without complications: E11.9

## 2022-10-03 HISTORY — DX: Gastro-esophageal reflux disease without esophagitis: K21.9

## 2022-10-03 HISTORY — DX: Atherosclerosis of aorta: I70.0

## 2022-10-03 HISTORY — DX: Other cardiomyopathies: I42.8

## 2022-10-03 HISTORY — DX: Atherosclerotic heart disease of native coronary artery without angina pectoris: I25.10

## 2022-10-03 HISTORY — DX: Essential (primary) hypertension: I10

## 2022-10-03 HISTORY — DX: Obstructive sleep apnea (adult) (pediatric): G47.33

## 2022-10-03 HISTORY — DX: Palpitations: R00.2

## 2022-10-03 HISTORY — DX: Other constipation: K59.09

## 2022-10-03 HISTORY — DX: Cardiomegaly: I51.7

## 2022-10-03 HISTORY — DX: Other chronic pain: G89.29

## 2022-10-03 HISTORY — DX: Other forms of dyspnea: R06.09

## 2022-10-03 HISTORY — DX: Nonalcoholic steatohepatitis (NASH): K75.81

## 2022-10-03 HISTORY — DX: Migraine, unspecified, not intractable, without status migrainosus: G43.909

## 2022-10-03 HISTORY — DX: Spondylosis without myelopathy or radiculopathy, lumbar region: M47.816

## 2022-10-03 HISTORY — DX: Umbilical hernia without obstruction or gangrene: K42.9

## 2022-10-03 HISTORY — DX: Acute kidney failure with tubular necrosis: N17.0

## 2022-10-03 HISTORY — DX: Vitamin D deficiency, unspecified: E55.9

## 2022-10-03 HISTORY — DX: Low back pain, unspecified: M54.50

## 2022-10-03 LAB — PROTIME-INR
INR: 1.4 — ABNORMAL HIGH (ref 0.8–1.2)
Prothrombin Time: 16.6 seconds — ABNORMAL HIGH (ref 11.4–15.2)

## 2022-10-03 LAB — GLUCOSE, CAPILLARY
Glucose-Capillary: 105 mg/dL — ABNORMAL HIGH (ref 70–99)
Glucose-Capillary: 96 mg/dL (ref 70–99)

## 2022-10-03 SURGERY — BIOPSY TEMPORAL ARTERY
Anesthesia: General | Site: Head

## 2022-10-03 MED ORDER — LIDOCAINE-EPINEPHRINE 1 %-1:100000 IJ SOLN
INTRAMUSCULAR | Status: DC | PRN
  Administered 2022-10-03: 10 mL via INTRAMUSCULAR

## 2022-10-03 MED ORDER — CHLORHEXIDINE GLUCONATE 0.12 % MT SOLN
OROMUCOSAL | Status: AC
  Administered 2022-10-03: 15 mL via OROMUCOSAL
  Filled 2022-10-03: qty 15

## 2022-10-03 MED ORDER — HYDROCODONE-ACETAMINOPHEN 5-325 MG PO TABS: 1.0000 | ORAL_TABLET | ORAL | 0 refills | Status: AC | PRN

## 2022-10-03 MED ORDER — PROPOFOL 10 MG/ML IV BOLUS
INTRAVENOUS | Status: AC
  Filled 2022-10-03: qty 20

## 2022-10-03 MED ORDER — OXYCODONE HCL 5 MG PO TABS: 5.0000 mg | ORAL_TABLET | Freq: Once | ORAL | Status: DC | PRN

## 2022-10-03 MED ORDER — FENTANYL CITRATE (PF) 100 MCG/2ML IJ SOLN
INTRAMUSCULAR | Status: AC
  Filled 2022-10-03: qty 2

## 2022-10-03 MED ORDER — EPHEDRINE SULFATE (PRESSORS) 50 MG/ML IJ SOLN
INTRAMUSCULAR | Status: DC | PRN
  Administered 2022-10-03: 5 mg via INTRAVENOUS
  Administered 2022-10-03: 10 mg via INTRAVENOUS
  Administered 2022-10-03 (×2): 5 mg via INTRAVENOUS

## 2022-10-03 MED ORDER — PROPOFOL 10 MG/ML IV BOLUS
INTRAVENOUS | Status: DC | PRN
  Administered 2022-10-03: 50 mg via INTRAVENOUS

## 2022-10-03 MED ORDER — MIDAZOLAM HCL 2 MG/2ML IJ SOLN
INTRAMUSCULAR | Status: AC
  Filled 2022-10-03: qty 2

## 2022-10-03 MED ORDER — PROPOFOL 500 MG/50ML IV EMUL
INTRAVENOUS | Status: DC | PRN
  Administered 2022-10-03: 100 ug/kg/min via INTRAVENOUS

## 2022-10-03 MED ORDER — CEFAZOLIN SODIUM-DEXTROSE 2-4 GM/100ML-% IV SOLN
INTRAVENOUS | Status: AC
  Filled 2022-10-03: qty 100

## 2022-10-03 MED ORDER — LIDOCAINE-EPINEPHRINE 1 %-1:100000 IJ SOLN
INTRAMUSCULAR | Status: AC
  Filled 2022-10-03: qty 1

## 2022-10-03 MED ORDER — FENTANYL CITRATE (PF) 100 MCG/2ML IJ SOLN
INTRAMUSCULAR | Status: DC | PRN
  Administered 2022-10-03 (×4): 25 ug via INTRAVENOUS

## 2022-10-03 MED ORDER — HYDROMORPHONE HCL 1 MG/ML IJ SOLN: 0.2500 mg | INTRAMUSCULAR | Status: DC | PRN

## 2022-10-03 MED ORDER — FAMOTIDINE 20 MG PO TABS
ORAL_TABLET | ORAL | Status: AC
  Administered 2022-10-03: 20 mg via ORAL
  Filled 2022-10-03: qty 1

## 2022-10-03 MED ORDER — OXYCODONE HCL 5 MG/5ML PO SOLN: 5.0000 mg | Freq: Once | ORAL | Status: DC | PRN

## 2022-10-03 MED ORDER — MIDAZOLAM HCL 2 MG/2ML IJ SOLN
INTRAMUSCULAR | Status: DC | PRN
  Administered 2022-10-03: 2 mg via INTRAVENOUS

## 2022-10-03 SURGICAL SUPPLY — 48 items
ADH SKN CLS APL DERMABOND .7 (GAUZE/BANDAGES/DRESSINGS) ×1
BALL CTTN STRL (GAUZE/BANDAGES/DRESSINGS) ×1
BLADE SURG 15 STRL LF DISP TIS (BLADE) ×1 IMPLANT
BLADE SURG 15 STRL SS (BLADE)
BLADE SURG SZ11 CARB STEEL (BLADE) ×1 IMPLANT
CNTNR SPEC 2.5X3XGRAD LEK (MISCELLANEOUS)
CONT SPEC 4OZ STER OR WHT (MISCELLANEOUS)
CONT SPEC 4OZ STRL OR WHT (MISCELLANEOUS)
CONTAINER SPEC 2.5X3XGRAD LEK (MISCELLANEOUS) IMPLANT
COTTON BALL STERILE (GAUZE/BANDAGES/DRESSINGS) ×1
COTTON BALL STERILE 4 PK (GAUZE/BANDAGES/DRESSINGS) ×1 IMPLANT
DERMABOND ADVANCED .7 DNX12 (GAUZE/BANDAGES/DRESSINGS) ×1 IMPLANT
DRAPE LAPAROTOMY 77X122 PED (DRAPES) ×1 IMPLANT
DRSG TELFA 3X4 N-ADH STERILE (GAUZE/BANDAGES/DRESSINGS) ×1 IMPLANT
ELECT CAUTERY BLADE 6.4 (BLADE) ×1 IMPLANT
ELECT REM PT RETURN 9FT ADLT (ELECTROSURGICAL) ×1
ELECTRODE REM PT RTRN 9FT ADLT (ELECTROSURGICAL) ×1 IMPLANT
GAUZE 4X4 16PLY ~~LOC~~+RFID DBL (SPONGE) ×1 IMPLANT
GLOVE BIO SURGEON STRL SZ 6.5 (GLOVE) ×1 IMPLANT
GLOVE BIOGEL PI IND STRL 6.5 (GLOVE) ×1 IMPLANT
GOWN STRL REUS W/ TWL LRG LVL3 (GOWN DISPOSABLE) ×1 IMPLANT
GOWN STRL REUS W/ TWL XL LVL3 (GOWN DISPOSABLE) ×2 IMPLANT
GOWN STRL REUS W/TWL LRG LVL3 (GOWN DISPOSABLE) ×1
GOWN STRL REUS W/TWL XL LVL3 (GOWN DISPOSABLE) ×1
KIT TURNOVER KIT A (KITS) ×1 IMPLANT
LABEL OR SOLS (LABEL) ×1 IMPLANT
MANIFOLD NEPTUNE II (INSTRUMENTS) ×1 IMPLANT
NDL HYPO 25X1 1.5 SAFETY (NEEDLE) IMPLANT
NEEDLE HYPO 25X1 1.5 SAFETY (NEEDLE) IMPLANT
NS IRRIG 500ML POUR BTL (IV SOLUTION) ×1 IMPLANT
PACK BASIN MINOR ARMC (MISCELLANEOUS) ×1 IMPLANT
SOL PREP PVP 2OZ (MISCELLANEOUS)
SOLUTION PREP PVP 2OZ (MISCELLANEOUS) ×1 IMPLANT
SUCTION FRAZIER HANDLE 10FR (MISCELLANEOUS)
SUCTION TUBE FRAZIER 10FR DISP (MISCELLANEOUS) ×1 IMPLANT
SUT MNCRL AB 4-0 PS2 18 (SUTURE) ×1 IMPLANT
SUT SILK 2 0 (SUTURE) ×1
SUT SILK 2-0 18XBRD TIE 12 (SUTURE) ×1 IMPLANT
SUT SILK 3 0 (SUTURE)
SUT SILK 3-0 18XBRD TIE 12 (SUTURE) ×1 IMPLANT
SUT SILK 4 0 (SUTURE)
SUT SILK 4-0 18XBRD TIE 12 (SUTURE) ×1 IMPLANT
SUT VIC AB 3-0 SH 27 (SUTURE) ×1
SUT VIC AB 3-0 SH 27X BRD (SUTURE) ×1 IMPLANT
SYR 10ML LL (SYRINGE) IMPLANT
SYR BULB IRRIG 60ML STRL (SYRINGE) ×1 IMPLANT
TRAP FLUID SMOKE EVACUATOR (MISCELLANEOUS) ×1 IMPLANT
WATER STERILE IRR 500ML POUR (IV SOLUTION) ×1 IMPLANT

## 2022-10-03 NOTE — Op Note (Signed)
Pre Op Diagnosis: Right temporal headaches  Post Op Diagnosis: Same  Procedure:  Right Temporal artery biopsy  Anesthesia: General LMA  Surgeon: Dr. Windell Moment  Indications: This 51 y.o. year old female has new-onset headache, jaw claudication, loss of visual acuity and diplopia on the right eye, elevated erythrocyte sedimentation rate. The patient has been informed that a temporal artery biopsy is required to confirm the diagnosis of temporal arteritis. The risks of the procedure were explained to the patient who elected to proceed with the biopsy.   Description of procedure: The patient was placed in the supine position with the head turned so the operative side is up. A time-out was completed verifying correct patient, procedure, site, positioning, and implant(s) and/or special equipment prior to beginning this procedure. The right temporal area was prepped and draped in the usual sterile fashion. Local anesthetics (1% Lidocaine) without epinephrine to minimize arterial spasm were injected. A 3.5 cm incision was made directly over the artery through the skin and subcutaneous tissue using a #15 blade scalpel. The temporal artery was identified in the superficial layers of the superficial temporal fascia. Blunt dissection with a hemostat was performed parallel to the vessel to avoid tearing it. The dissection proceeded beneath the vessel so that a hemostat was passed below it. After the vessel was isolated, 4-0 silk sutures were passed around the proximal and distal portions of the isolated artery and tied. Branches of the main artery were also ligated. The vessel was then transected. After ensuring hemostasis, the subcutaneous tissues were closed using interrupted 4-0 Vicryl sutures. The skin was closed with a running subcuticular 4-0 Monocryl sutures. Dermabond applied over the wound and covered with sterile dressings.  The patient tolerated well the procedure.  The specimen was sent to the  pathologist.   Specimen: Right Temporal Artery  Herbert Pun, MD, FACS

## 2022-10-03 NOTE — Anesthesia Preprocedure Evaluation (Signed)
Anesthesia Evaluation  Patient identified by MRN, date of birth, ID band Patient awake    Reviewed: Allergy & Precautions, NPO status , Patient's Chart, lab work & pertinent test results  Airway Mallampati: III  TM Distance: >3 FB Neck ROM: full    Dental  (+) Chipped   Pulmonary shortness of breath and with exertion, asthma , sleep apnea and Continuous Positive Airway Pressure Ventilation , COPD,  COPD inhaler, Current Smoker and Patient abstained from smoking.   Pulmonary exam normal        Cardiovascular hypertension, On Medications and On Home Beta Blockers + CAD, +CHF and + DOE  + dysrhythmias Atrial Fibrillation   AVR, MVR, TVR  Most recent TTE was performed on 08/13/2021 revealing a normal left ventricular systolic function with moderate concentric LVH; LVEF >55%. Left ventricular diastolic Doppler parameters consistent with a restrictive filling pattern (G3DD).  Left atrium moderately enlarged and right atrium MILDLY enlarged.  Right ventricle also noted to be mildly enlarged.  Mechanical aortic valve well-seated and functioning properly; mean transvalvular gradient 23.1 mmHg.  Bioprosthetic mechanical mitral valve also well-seated with normal function; mean transvalvular gradient 4.5 mmHg.  Bioprosthetic tricuspid valve in normal position and functioning normally; peak TR velocity to 11.1 cm/sec.  Most recent myocardial perfusion imaging study was performed on 04/13/2022 revealing a normal left ventricular systolic function with an EF of 64%.  There was normal regional wall motion.  There was no evidence of stress-induced myocardial ischemia or arrhythmia; no scintigraphic evidence of scar.  Study determined to be normal and low risk overall.       Neuro/Psych  Headaches PSYCHIATRIC DISORDERS Anxiety Depression     Neuromuscular disease    GI/Hepatic hiatal hernia,GERD  Medicated,,(+) Cirrhosis         Endo/Other  negative  endocrine ROSdiabetes, Type 2  Hyperaldosteronism   Renal/GU Renal disease     Musculoskeletal   Abdominal   Peds  Hematology  (+) Blood dyscrasia, anemia   Anesthesia Other Findings Past Medical History: No date: Anemia No date: Anxiety     Comment:  a.) on BZO (diazepam) PRN No date: Aortic atherosclerosis ([place]) No date: Aortic valve stenosis and insufficiency, rheumatic     Comment:  a.) s/p AVR 04/03/2015 - 19 mm St. Jude mechanical valve No date: Arthritis No date: Asthma No date: ATN (acute tubular necrosis) (HCC) No date: Cardiomegaly No date: CHF (congestive heart failure) ([place])     Comment:  a.) TTE 08/13/2022: EF >55%, mod LAE, mild RV/RAE,               triv-mild panvalvular regurgitation, G3DD. No date: Chronic anticoagulation No date: Chronic constipation     Comment:  a.) on linaclotide No date: Chronic lower back pain No date: Cirrhosis ([place]) No date: Coronary artery calcification seen on CT scan     Comment:  a.) LHC 12/05/2014: normal coronaries No date: Depression No date: DOE (dyspnea on exertion) No date: Emphysema of lung (HCC) No date: GERD (gastroesophageal reflux disease) No date: Helicobacter pylori infection No date: History of hiatal hernia No date: History of methicillin resistant staphylococcus aureus (MRSA) No date: HTN (hypertension) No date: Hyperaldosteronism (HCC) No date: Hyperlipidemia No date: Hypertension No date: IBS (irritable bowel syndrome) No date: Long term current use of anticoagulant     Comment:  a.) warfarin No date: Lumbar facet joint syndrome No date: Migraines No date: Mitral stenosis with insufficiency, rheumatic     Comment:  a.) s/p MVR  04/03/2015 - 25 mm St. Jude mechanical valve No date: Moderate tricuspid regurgitation     Comment:  a.) s/p TVR 04/03/2015 - 21 mm Mosaic porcine valve No date: NASH (nonalcoholic steatohepatitis) No date: Obesity No date: OSA on CPAP No date: Palpitations No date:  Paroxysmal atrial flutter (HCC)     Comment:  a.) CHA2DS2VASc = 5 (sex, CHF, HTN, vascular disease               history, T2DM);  b.) rate/rhythm maintained without               pharmacological intervention; chronically anticoagulated               with warfarin No date: Sciatic pain No date: T2DM (type 2 diabetes mellitus) ([place]) No date: Umbilical hernia     Comment:  a.) s/p repair 09/2004 No date: 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 Jude               Regent mechanical valve); Location: [facility]; Surgeon: Monia Pouch, MD 07/25/2017: BREAST BIOPSY; Left     Comment:  BENIGN BREAST TISSUE WITH MICROCALCIFICATIONS 10/22/2019: BREAST BIOPSY; Right     Comment:  Affirm Bx- X-clip- CSL, no atypia 10/22/2019: BREAST BIOPSY; Right     Comment:  Affirm Bx- Ribbon clip- CSL, no atypia 11/20/2019: BREAST BIOPSY; Right     Comment:  Procedure: BREAST BIOPSY WITH NEEDLE LOCALIZATION x 2;                Surgeon: Herbert Pun, MD;  Location: [facility];               Service: General;  Laterality: Right; 1992: BREAST EXCISIONAL BIOPSY; Right     Comment:  NEG 11/20/2019: BREAST LUMPECTOMY; Right     Comment:  complex sclerosing lesion x 2 2000: CESAREAN SECTION; N/A 1987: CESAREAN SECTION; N/A 07/2020: CHOLECYSTECTOMY 09/21/2020: COLONOSCOPY WITH PROPOFOL; N/A     Comment:  Procedure: COLONOSCOPY WITH PROPOFOL;  Surgeon: [place],               Benay Pike, MD;  Location: [facility];  Service:               Gastroenterology;  Laterality: N/A; 10/07/2016: ESOPHAGOGASTRODUODENOSCOPY (EGD) WITH PROPOFOL; N/A     Comment:  Procedure: ESOPHAGOGASTRODUODENOSCOPY (EGD) WITH               PROPOFOL;  Surgeon:  Jonathon Bellows, MD;  Location: [facility]               ENDOSCOPY;  Service: Endoscopy;  Laterality: N/A; 09/21/2020: ESOPHAGOGASTRODUODENOSCOPY (EGD) WITH PROPOFOL; N/A     Comment:  Procedure: ESOPHAGOGASTRODUODENOSCOPY (EGD) WITH               PROPOFOL;  Surgeon: [place], Benay Pike, MD;  Location:               [facility];  Service: Gastroenterology;  Laterality:               N/A; No date: LAPAROSCOPIC GASTRIC BAND REMOVAL WITH LAPAROSCOPIC GASTRIC  SLEEVE RESECTION  12/05/2014: LEFT HEART CATH AND CORONARY ANGIOGRAPHY; Left     Comment:  Procedure: LEFT HEART CATH AND CORONARY ANGIOGRAPHY;               Location: [place]; Surgeon: Serafina Royals, MD 1996: LIPOSUCTION TRUNK 04/03/2015: MITRAL VALVE REPLACEMENT     Comment:  Procedure: MITRAL VALVE REPLACEMENT (25 mm St. Jude               Mechanical valve); Location: [facility]; Surgeon: Monia Pouch,              MD 04/03/2015: TRICUSPID VALVE REPLACEMENT     Comment:  Procedure: TRICUSPID VALVE REPLACEMENT (21 mm Mosaic               porcine valve); Location: [facility]; Surgeon: Monia Pouch, MD 2000: TUBAL LIGATION 8159: UMBILICAL HERNIA REPAIR No date: VENTRAL HERNIA REPAIR  BMI    Body Mass Index: 42.34 kg/m      Reproductive/Obstetrics negative OB ROS                             Anesthesia Physical Anesthesia Plan  ASA: 3  Anesthesia Plan: General LMA   Post-op Pain Management: Minimal or no pain anticipated   Induction: Intravenous  PONV Risk Score and Plan: 3 and Dexamethasone, Ondansetron, Midazolam and Treatment may vary due to age or medical condition  Airway Management Planned: LMA  Additional Equipment:   Intra-op Plan:   Post-operative Plan: Extubation in OR  Informed Consent: I have reviewed the patients History and Physical, chart, labs and discussed the procedure including the risks, benefits and alternatives for the proposed anesthesia with the patient or authorized representative who has  indicated his/her understanding and acceptance.     Dental Advisory Given  Plan Discussed with: Anesthesiologist, CRNA and Surgeon  Anesthesia Plan Comments: (Patient consented for risks of anesthesia including but not limited to:  - adverse reactions to medications - damage to eyes, teeth, lips or other oral mucosa - nerve damage due to positioning  - sore throat or hoarseness - Damage to heart, brain, nerves, lungs, other parts of body or loss of life  Patient voiced understanding.)       Anesthesia Quick Evaluation

## 2022-10-03 NOTE — Discharge Instructions (Addendum)
  Diet: Resume home heart healthy regular diet.   Activity: Increase activity as tolerated. Light activity and walking are encouraged. Do not drive or drink alcohol if taking narcotic pain medications.  Wound care: May shower with soapy water and pat dry (do not rub incisions), but no baths or submerging incision underwater until follow-up. (no swimming)   Medications: Resume all home medications. For mild to moderate pain: acetaminophen (Tylenol) or ibuprofen (if no kidney disease). Combining Tylenol with alcohol can substantially increase your risk of causing liver disease. Narcotic pain medications, if prescribed, can be used for severe pain, though may cause nausea, constipation, and drowsiness. Do not combine Tylenol and Norco within a 6 hour period as Norco contains Tylenol. If you do not need the narcotic pain medication, you do not need to fill the prescription.  Restart Coumadin tomorrow as instructed by your Cardiologist.   Call office [phone]) at any time if any questions, worsening pain, fevers/chills, bleeding, drainage from incision site, or other concerns.    AMBULATORY SURGERY  DISCHARGE INSTRUCTIONS   The drugs that you were given will stay in your system until tomorrow so for the next 24 hours you should not:  Drive an automobile Make any legal decisions Drink any alcoholic beverage   You may resume regular meals tomorrow.  Today it is better to start with liquids and gradually work up to solid foods.  You may eat anything you prefer, but it is better to start with liquids, then soup and crackers, and gradually work up to solid foods.   Please notify your doctor immediately if you have any unusual bleeding, trouble breathing, redness and pain at the surgery site, drainage, fever, or pain not relieved by medication.    Additional Instructions:  Please contact your physician with any problems or Same Day Surgery at [phone], Monday through Friday 6 am to  4 pm, or [place] at [facility] number at [phone].

## 2022-10-03 NOTE — Interval H&P Note (Signed)
History and Physical Interval Note:  10/03/2022 10:54 AM  Shelly Huynh  has presented today for surgery, with the diagnosis of R51.9 Temporal headache.  The various methods of treatment have been discussed with the patient and family. After consideration of risks, benefits and other options for treatment, the patient has consented to  Procedure(s): BIOPSY TEMPORAL ARTERY (N/A) as a surgical intervention.  The patient's history has been reviewed, patient examined, no change in status, stable for surgery.  I have reviewed the patient's chart and labs.  Questions were answered to the patient's satisfaction.     Herbert Pun

## 2022-10-03 NOTE — Transfer of Care (Signed)
Immediate Anesthesia Transfer of Care Note  Patient: Shelly Huynh  Procedure(s) Performed: BIOPSY TEMPORAL ARTERY (Head)  Patient Location: PACU  Anesthesia Type:General  Level of Consciousness: awake, alert , and oriented  Airway & Oxygen Therapy: Patient Spontanous Breathing and Patient connected to face mask oxygen  Post-op Assessment: Report given to RN, Post -op Vital signs reviewed and stable, and Patient moving all extremities X 4  Post vital signs: stable  Last Vitals:  Vitals Value Taken Time  BP 119/66 10/03/22 1400  Temp    Pulse 84 10/03/22 1403  Resp 29 10/03/22 1403  SpO2 99 % 10/03/22 1403  Vitals shown include unvalidated device data.  Last Pain:  Vitals:   10/03/22 1044  TempSrc: Oral         Complications: No notable events documented.

## 2022-10-03 NOTE — Anesthesia Postprocedure Evaluation (Signed)
Anesthesia Post Note  Patient: Shelly Huynh  Procedure(s) Performed: BIOPSY TEMPORAL ARTERY (Head)  Patient location during evaluation: PACU Anesthesia Type: General Level of consciousness: awake and alert Pain management: pain level controlled Vital Signs Assessment: post-procedure vital signs reviewed and stable Respiratory status: spontaneous breathing, nonlabored ventilation, respiratory function stable and patient connected to nasal cannula oxygen Cardiovascular status: blood pressure returned to baseline and stable Postop Assessment: no apparent nausea or vomiting Anesthetic complications: no   No notable events documented.   Last Vitals:  Vitals:   10/03/22 1044 10/03/22 1400  BP: 107/79 119/66  Pulse:  80  Resp: 18 16  Temp: 36.9 C (!) 36.2 C  SpO2: 97% 98%    Last Pain:  Vitals:   10/03/22 1400  TempSrc:   PainSc: Asleep                 Ilene Qua

## 2022-10-04 ENCOUNTER — Encounter: Payer: Self-pay | Admitting: General Surgery

## 2022-10-04 LAB — SURGICAL PATHOLOGY

## 2022-10-06 ENCOUNTER — Emergency Department: Payer: 59

## 2022-10-06 ENCOUNTER — Other Ambulatory Visit: Payer: Self-pay

## 2022-10-06 ENCOUNTER — Emergency Department
Admission: EM | Admit: 2022-10-06 | Discharge: 2022-10-07 | Disposition: A | Discharge status: Home / Self Care | Payer: 59 | Attending: Emergency Medicine | Admitting: Emergency Medicine

## 2022-10-06 DIAGNOSIS — Z79899 Other long term (current) drug therapy: Secondary | ICD-10-CM | POA: Insufficient documentation

## 2022-10-06 DIAGNOSIS — G8918 Other acute postprocedural pain: Secondary | ICD-10-CM | POA: Diagnosis not present

## 2022-10-06 DIAGNOSIS — Z7982 Long term (current) use of aspirin: Secondary | ICD-10-CM | POA: Diagnosis not present

## 2022-10-06 DIAGNOSIS — J45909 Unspecified asthma, uncomplicated: Secondary | ICD-10-CM | POA: Insufficient documentation

## 2022-10-06 DIAGNOSIS — I110 Hypertensive heart disease with heart failure: Secondary | ICD-10-CM | POA: Diagnosis not present

## 2022-10-06 DIAGNOSIS — I2510 Atherosclerotic heart disease of native coronary artery without angina pectoris: Secondary | ICD-10-CM | POA: Insufficient documentation

## 2022-10-06 DIAGNOSIS — Z7901 Long term (current) use of anticoagulants: Secondary | ICD-10-CM | POA: Diagnosis not present

## 2022-10-06 DIAGNOSIS — R220 Localized swelling, mass and lump, head: Secondary | ICD-10-CM

## 2022-10-06 DIAGNOSIS — Z7951 Long term (current) use of inhaled steroids: Secondary | ICD-10-CM | POA: Diagnosis not present

## 2022-10-06 DIAGNOSIS — R519 Headache, unspecified: Secondary | ICD-10-CM | POA: Diagnosis not present

## 2022-10-06 DIAGNOSIS — F17200 Nicotine dependence, unspecified, uncomplicated: Secondary | ICD-10-CM | POA: Diagnosis not present

## 2022-10-06 DIAGNOSIS — I509 Heart failure, unspecified: Secondary | ICD-10-CM | POA: Diagnosis not present

## 2022-10-06 DIAGNOSIS — E119 Type 2 diabetes mellitus without complications: Secondary | ICD-10-CM | POA: Insufficient documentation

## 2022-10-06 LAB — BASIC METABOLIC PANEL
Anion gap: 7 (ref 5–15)
BUN: 10 mg/dL (ref 6–20)
CO2: 24 mmol/L (ref 22–32)
Calcium: 9.2 mg/dL (ref 8.9–10.3)
Chloride: 106 mmol/L (ref 98–111)
Creatinine, Ser: 1 mg/dL (ref 0.44–1.00)
GFR, Estimated: >60 mL/min (ref >60)
Glucose, Bld: 111 mg/dL — ABNORMAL HIGH (ref 70–99)
Potassium: 3.9 mmol/L (ref 3.5–5.1)
Sodium: 137 mmol/L (ref 135–145)

## 2022-10-06 LAB — PROTIME-INR
INR: 1.3 — ABNORMAL HIGH (ref 0.8–1.2)
Prothrombin Time: 15.7 seconds — ABNORMAL HIGH (ref 11.4–15.2)

## 2022-10-06 LAB — CBC
HCT: 40.7 % (ref 36.0–46.0)
Hemoglobin: 13.2 g/dL (ref 12.0–15.0)
MCH: 28.6 pg (ref 26.0–34.0)
MCHC: 32.4 g/dL (ref 30.0–36.0)
MCV: 88.3 fL (ref 80.0–100.0)
Platelets: 252 10*3/uL (ref 150–400)
RBC: 4.61 MIL/uL (ref 3.87–5.11)
RDW: 16.3 % — ABNORMAL HIGH (ref 11.5–15.5)
WBC: 8.5 10*3/uL (ref 4.0–10.5)
nRBC: 0 % (ref 0.0–0.2)

## 2022-10-06 MED ORDER — MORPHINE SULFATE (PF) 4 MG/ML IV SOLN
4.0000 mg | Freq: Once | INTRAVENOUS | Status: AC
  Administered 2022-10-07: 4 mg via INTRAMUSCULAR
  Filled 2022-10-06: qty 1

## 2022-10-06 MED ORDER — MORPHINE SULFATE (PF) 4 MG/ML IV SOLN: 4.0000 mg | Freq: Once | INTRAVENOUS | Status: DC

## 2022-10-06 NOTE — ED Notes (Addendum)
Per Siadecki MD, no other labs/scans at this time

## 2022-10-06 NOTE — ED Triage Notes (Signed)
Pt comes from home via POV c/o facial swelling. Pt had temporal artery biopsy this Monday. Pt having right sided swelling that started yesterday. Pt takes coumadin and was told to come to ER if there is any facial swelling. Pt having pain on that right side. Pt in NAD at this time.

## 2022-10-06 NOTE — ED Notes (Addendum)
Pt's visitor to desk upset over wait time and that pt has not had a scan yet; informed him that pt was next to go an exam room and that she would be evaluated further for any additional exams needed; assured him that her situation had been reviewed already with the ED provider and that no further orders were given at this time; visitor remains upset over wait time and the fact that "we don't go in order"; explained to pt the separate areas of the ED and triage process

## 2022-10-06 NOTE — ED Provider Notes (Signed)
[facility] 2 Provider Note    Event Date/Time   First MD Initiated Contact with Patient 10/06/22 2258     (approximate)   History   Post-op Problem   HPI  Shelly KAMYRAH FEESER is a 51 y.o. female who presents to the ED from home with a chief complaint of facial swelling.  Patient had temporal artery biopsy on 10/03/2022 by Dr. Peyton Najjar.  Takes warfarin for aortic valve replacement.  Held warfarin x5 days prior to surgery, bridged with Lovenox.  Held all anticoagulation x1 day prior to surgery as well as the day of surgery, then resumed day after surgery with warfarin plus Lovenox.  Noted right-sided facial and scalp swelling which began yesterday.  Norco is not controlling her pain.  Denies fever, cough, chest pain, shortness of breath, abdominal pain, nausea, vomiting or dizziness.  Denies vision changes or neck pain.     Past Medical History   Past Medical History:  Diagnosis Date   Anemia    Anxiety    a.) on BZO (diazepam) PRN   Aortic atherosclerosis (HCC)    Aortic valve stenosis and insufficiency, rheumatic    a.) s/p AVR 04/03/2015 - 19 mm St. Jude mechanical valve   Arthritis    Asthma    ATN (acute tubular necrosis) (HCC)    Cardiomegaly    CHF (congestive heart failure) ([place])    a.) TTE 08/13/2022: EF >55%, mod LAE, mild RV/RAE, triv-mild panvalvular regurgitation, G3DD.   Chronic anticoagulation    Chronic constipation    a.) on linaclotide   Chronic lower back pain    Cirrhosis (HCC)    Coronary artery calcification seen on CT scan    a.) LHC 12/05/2014: normal coronaries   Depression    DOE (dyspnea on exertion)    Emphysema of lung (HCC)    GERD (gastroesophageal reflux disease)    Helicobacter pylori infection    History of hiatal hernia    History of methicillin resistant staphylococcus aureus (MRSA)    HTN (hypertension)    Hyperaldosteronism (HCC)    Hyperlipidemia    Hypertension    IBS (irritable bowel syndrome)    Long  term current use of anticoagulant    a.) warfarin   Lumbar facet joint syndrome    Migraines    Mitral stenosis with insufficiency, rheumatic    a.) s/p MVR 04/03/2015 - 25 mm St. 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[place])    a.) s/p aortic, mitral, and tricuspid valve replacements 04/03/2015   Vitamin D deficiency      Active Problem List   Patient Active Problem List   Diagnosis Date Noted   High total IgG 08/29/2022   Lumbosacral L5-S1 facet arthritis (Severe) (Bilateral) 02/18/2020   Osteoarthritis of hips (Bilateral) 02/18/2020   Chronic prescription opiate use 01/16/2020   Neurogenic pain 01/16/2020   DDD (degenerative disc disease), lumbosacral 01/07/2020   History of Allergy to  iodine 01/07/2020    Class: History of   History of allergy to radiographic contrast media 01/07/2020   H/O mechanical aortic valve replacement 12/16/2019   Chronic low back pain (1ry area of Pain) (Bilateral) (L>R) w/o sciatica 12/16/2019   Chronic lower extremity pain (2ry area of Pain) (Bilateral) (L>R) 12/16/2019   Chronic hip pain (3ry area of Pain) (Bilateral) (L>R) 12/16/2019   Chronic knee pain (4th area of Pain) (Bilateral) (L>R) 12/16/2019   Chronic recurrent occipital headache (Bilateral) 12/16/2019   Cervicogenic headache (6th area of Pain) (Bilateral) 12/16/2019   Chronic neck pain (5th area of Pain) (Bilateral) (L>R) 12/16/2019   Chronic shoulder pain (Bilateral) (L>R) 12/16/2019    Chronic upper extremity pain (7th area of Pain) (Bilateral) (R>L) 12/16/2019   Chronic pain syndrome 12/16/2019   Pharmacologic therapy 12/16/2019   Disorder of skeletal system 12/16/2019   Problems influencing health status 12/16/2019   Abnormal MRI, cervical spine (09/06/2019) 12/16/2019   Cervical central spinal stenosis 12/16/2019   Cervical foraminal stenosis (C2-3, C4-5) (Left) 12/16/2019   Abnormal MRI, lumbar spine (08/24/2015) 12/16/2019   Lumbar facet hypertrophy (L4-5 and L5-S1) (Bilateral) 12/16/2019   Lumbar facet syndrome (Bilateral) (L>R) 12/16/2019   Cirrhosis of liver without ascites (HCC) 10/28/2019   Elevated hemoglobin A1c 10/28/2019   Lumbar radicular pain 10/28/2019   Bilateral hand numbness 10/22/2019   Cervicalgia 10/22/2019   BMI 40.0-44.9, adult ([place]) 04/10/2019   Irritable bowel syndrome with constipation 12/26/2018   Status post bariatric surgery 09/12/2018   Hypertension due to endocrine disorder 08/17/2018   Hypertension, well controlled 08/14/2018   Hyperaldosteronism ([place]) 06/24/2018   Cervical radiculopathy 06/12/2018   Anxiety state 04/02/2018   HCAP (healthcare-associated pneumonia) 10/03/2017   Pleural effusion 10/02/2017   Chest pain on breathing 09/21/2017   Aortic valve disease, 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   Tricuspid valve disorder 09/21/2017   Mitral valve disease, 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   SOBOE (shortness of breath on exertion) 16/08/9603   Positive Helicobacter pylori serology 01/05/2017   Tobacco abuse 09/19/2016   On anticoagulant therapy 09/07/2016   Asthma 09/07/2016   Diabetes mellitus ([place]) 09/07/2016   GERD with esophagitis 09/07/2016   Hypertension 09/07/2016   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[place]) 09/07/2016   Hypercholesterolemia 09/07/2016   Obstructive sleep apnea syndrome 09/07/2016   Bilateral leg edema 08/23/2016   Osteoarthritis of spine with radiculopathy, lumbar region 08/08/2016   Diabetes mellitus type 2, diet-controlled ([place])  05/17/2016   Vitamin D deficiency 05/17/2016   Gastroesophageal reflux disease without esophagitis 10/12/2015   Spondylosis of lumbar region without myelopathy or radiculopathy 10/12/2015   Adiposity 06/30/2015   Compulsive tobacco user syndrome 06/30/2015   Nicotine dependence, uncomplicated 54/07/8118   Chronic hypokalemia 06/15/2015   Palpitations 05/18/2015   Chronic anticoagulation (Coumadin) 05/07/2015   Benign essential HTN 05/05/2015   Absolute anemia 04/26/2015   Acute on chronic combined systolic and diastolic congestive heart failure ([place]) 04/08/2015   H/O aortic valve replacement 04/07/2015   H/O heart valve replacement with mechanical valve 04/07/2015   H/O prosthetic heart valve 04/07/2015   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 04/07/2015   Acute right heart failure ([place]) 04/04/2015   History of open heart surgery 04/03/2015   H/O mitral valve replacement with mechanical valve 04/03/2015   H/O tricuspid valve replacement 04/03/2015   Presence of prosthetic heart valve 04/03/2015   History of tricuspid valve replacement with bioprosthetic valve 04/03/2015   Refusal of blood transfusions as patient is Jehovah's Witness 03/12/2015   Acute respiratory failure with hypoxemia (  [place]) 03/05/2015   Coronary artery disease due to calcified coronary lesion 03/04/2015   Obstructive apnea 10/28/2014   Enlarged heart 10/28/2014   Combined fat and carbohydrate induced hyperlipemia 09/11/2014     Past Surgical History   Past Surgical History:  Procedure Laterality Date   ABDOMINAL HYSTERECTOMY  2010   AORTIC VALVE REPLACEMENT  04/03/2015   Procedure: AORTIC VALVE REPLACEMENT (19 mm St. Jude Regent mechanical valve); Location: [facility]; Surgeon: Monia Pouch, MD   ARTERY BIOPSY N/A 10/03/2022   Procedure: BIOPSY TEMPORAL ARTERY;  Surgeon: Herbert Pun, MD;  Location: [facility];  Service: General;  Laterality: N/A;   BREAST BIOPSY Left 07/25/2017   BENIGN BREAST TISSUE WITH  MICROCALCIFICATIONS   BREAST BIOPSY Right 10/22/2019   Affirm Bx- X-clip- CSL, no atypia   BREAST BIOPSY Right 10/22/2019   Affirm Bx- Ribbon clip- CSL, no atypia   BREAST BIOPSY Right 11/20/2019   Procedure: BREAST BIOPSY WITH NEEDLE LOCALIZATION x 2;  Surgeon: Herbert Pun, MD;  Location: [facility];  Service: General;  Laterality: Right;   BREAST EXCISIONAL BIOPSY Right 1992   NEG   BREAST LUMPECTOMY Right 11/20/2019   complex sclerosing lesion x 2   CESAREAN SECTION N/A 2000   CESAREAN SECTION N/A 1987   CHOLECYSTECTOMY  07/2020   COLONOSCOPY WITH PROPOFOL N/A 09/21/2020   Procedure: COLONOSCOPY WITH PROPOFOL;  Surgeon: Toledo, Benay Pike, MD;  Location: [facility];  Service: Gastroenterology;  Laterality: N/A;   ESOPHAGOGASTRODUODENOSCOPY (EGD) WITH PROPOFOL N/A 10/07/2016   Procedure: ESOPHAGOGASTRODUODENOSCOPY (EGD) WITH PROPOFOL;  Surgeon: Jonathon Bellows, MD;  Location: [facility];  Service: Endoscopy;  Laterality: N/A;   ESOPHAGOGASTRODUODENOSCOPY (EGD) WITH PROPOFOL N/A 09/21/2020   Procedure: ESOPHAGOGASTRODUODENOSCOPY (EGD) WITH PROPOFOL;  Surgeon: Toledo, Benay Pike, MD;  Location: [facility];  Service: Gastroenterology;  Laterality: N/A;   LAPAROSCOPIC GASTRIC BAND REMOVAL WITH LAPAROSCOPIC GASTRIC SLEEVE RESECTION     LEFT HEART CATH AND CORONARY ANGIOGRAPHY Left 12/05/2014   Procedure: LEFT HEART CATH AND CORONARY ANGIOGRAPHY; Location: [facility]; Surgeon: Serafina Royals, MD   LIPOSUCTION TRUNK  1996   MITRAL VALVE REPLACEMENT  04/03/2015   Procedure: MITRAL VALVE REPLACEMENT (25 mm St. Jude Mechanical valve); Location: [facility]; Surgeon: Monia Pouch, MD   TRICUSPID VALVE REPLACEMENT  04/03/2015   Procedure: TRICUSPID VALVE REPLACEMENT (21 mm Mosaic porcine valve); Location: [facility]; Surgeon: Monia Pouch, MD   TUBAL LIGATION  2683   UMBILICAL HERNIA REPAIR  2005   VENTRAL HERNIA REPAIR       Home Medications   Prior to Admission medications   Medication Sig  Start Date End Date Taking? Authorizing Provider  methylPREDNISolone (MEDROL DOSEPAK) 4 MG TBPK tablet Take as directed 10/07/22  Yes Paulette Blanch, MD  oxyCODONE-acetaminophen (PERCOCET/ROXICET) 5-325 MG tablet Take 1 tablet by mouth every 4 (four) hours as needed for severe pain. 10/07/22  Yes Paulette Blanch, MD  albuterol (VENTOLIN HFA) 108 (90 Base) MCG/ACT inhaler Inhale 2 puffs into the lungs every 6 (six) hours as needed for wheezing or shortness of breath.    [provider]  amoxicillin (AMOXIL) 500 MG capsule Take 2,000 mg by mouth once. Prior to dental or surgery    [provider]  aspirin EC 81 MG tablet Take 81 mg daily by mouth.    [provider]  candesartan (ATACAND) 8 MG tablet Take 8 mg by mouth at bedtime.  12/07/19   [provider]  Cranberry-Vitamin C (CRANBERRY CONCENTRATE/VITAMINC PO) Take 2 capsules by mouth daily.  [provider]  cyclobenzaprine (FLEXERIL) 10 MG tablet Take 1 tablet by mouth 3 (three) times daily as needed. 04/10/19   [provider]  diazepam (VALIUM) 5 MG tablet Take 5 mg by mouth every 12 (twelve) hours as needed for anxiety or muscle spasms.    [provider]  enoxaparin (LOVENOX) 120 MG/0.8ML injection Inject 120 mg into the skin 2 (two) times daily. Coumadin/lovenox bridge for temporal artery biopsy surgery. Following calendar from Dr. Nehemiah Massed    [provider]  fluticasone [facility]) 50 MCG/ACT nasal spray Place 2 sprays into both nostrils daily as needed for allergies or rhinitis.    [provider]  gabapentin (NEURONTIN) 300 MG capsule Take by mouth 2 (two) times daily. 01/05/22   [provider]  Galcanezumab-gnlm (EMGALITY [place]) Inject into the skin. Patient not taking: Reported on 09/29/2022    [provider]  Galcanezumab-gnlm 120 MG/ML SOSY Inject 120 mg into the skin every 30 (thirty) days.  Patient not taking: Reported on 07/05/2022    [provider]  HYDROcodone-acetaminophen (NORCO/VICODIN) 5-325 MG tablet Take 1 tablet by mouth every 6 (six) hours as needed for moderate pain.    [provider]  hydrocortisone 2.5 % cream Apply 1 application  topically 2 (two) times daily as needed.    [provider]  ipratropium-albuterol (DUONEB) 0.5-2.5 (3) MG/3ML SOLN Take 3 mLs by nebulization every 6 (six) hours as needed.    [provider]  iron polysaccharides (NIFEREX) 150 MG capsule Take 150 mg by mouth daily.    [provider]  levalbuterol (XOPENEX) 1.25 MG/0.5ML nebulizer solution Take 1.25 mg by nebulization every 8 (eight) hours as needed for wheezing or shortness of breath.    [provider]  levocetirizine (XYZAL) 5 MG tablet Take 5 mg by mouth every evening.  05/10/17   [provider]  linaclotide (LINZESS) 72 MCG capsule Take 72 mcg by mouth daily as needed.    [provider]  meclizine (ANTIVERT) 25 MG tablet Take 25 mg by mouth every 6 (six) hours as needed for dizziness. 05/11/15   [provider]  memantine (NAMENDA) 5 MG tablet Take 5 mg by mouth 2 (two) times daily.     [provider]  montelukast (SINGULAIR) 10 MG tablet Take 10 mg by mouth at bedtime.    [provider]  nystatin (MYCOSTATIN/NYSTOP) powder Apply 1 Application topically in the morning and at bedtime.    [provider]  OZEMPIC, 0.25 OR 0.5 MG/DOSE, 2 MG/3ML SOPN Inject 0.5 mg into the skin once a week. sunday 04/04/22   [provider]  pravastatin (PRAVACHOL) 20 MG tablet Take 20 mg by mouth at bedtime.  12/07/14   [provider]  spironolactone (ALDACTONE) 25 MG tablet Take 25 mg by mouth daily.     [provider]  spironolactone (ALDACTONE) 50 MG tablet Take 50 mg by mouth daily.    [provider]  Vitamin D, Ergocalciferol, (DRISDOL) 1.25 MG (50000 UNIT) CAPS capsule Take 50,000 Units by mouth once a week.  sunday 06/10/22   [provider]  warfarin (COUMADIN) 1 MG tablet Take 1 mg by mouth.    [provider]  warfarin (COUMADIN) 4 MG tablet Take 4 mg by mouth.    [provider]  warfarin (COUMADIN) 6 MG tablet Take 6 mg by mouth.    [provider]  warfarin (COUMADIN) 7.5 MG tablet Take 1 tablet (7.5 mg  total) one time only at 6 PM by mouth. Patient taking differently: Take 7.5 mg by mouth. Coumadin dosage according to lab results 10/04/17   Bettey Costa, MD     Allergies  Ivp dye [iodinated contrast media], Sulfa antibiotics, Banana, Guaifenesin, Hctz [hydrochlorothiazide], Keflex [cephalexin], Losartan, Povidone iodine, Zofran [ondansetron], Lisinopril, and Mucinex [guaifenesin er]   Family History   Family History  Problem Relation Age of Onset   Thyroid cancer Mother    Uterine cancer Mother    Breast cancer Paternal Grandmother    Lung cancer Paternal Grandmother    Pancreatic cancer Cousin      Physical Exam  Triage Vital Signs: ED Triage Vitals  Enc Vitals Group     BP 10/06/22 1930 (!) 148/89     Pulse Rate 10/06/22 1930 74     Resp 10/06/22 1930 20     Temp 10/06/22 1930 99 F (37.2 C)     Temp Source 10/06/22 1930 Oral     SpO2 10/06/22 1930 92 %     Weight 10/06/22 1930 239 lb (108.4 kg)     Height 10/06/22 1930 5' 3"  (1.6 m)     Head Circumference --      Peak Flow --      Pain Score 10/06/22 1938 8     Pain Loc --      Pain Edu? --      Excl. in [place]? --     Updated Vital Signs: BP 121/60 (BP Location: Right Arm)   Pulse (!) 58   Temp 98.1 F (36.7 C) (Oral)   Resp 16   Ht 5' 3"  (1.6 m)   Wt 108.4 kg   SpO2 100%   BMI 42.34 kg/m    General: Awake, no distress.  CV:  RRR.  Good peripheral perfusion.  Resp:  Normal effort.  CTAB. Abd:  Nontender.  No distention.  Other:  Mild right parietal scalp, cheek and periorbital swelling.  Biopsy site clean/dry/intact.  No warmth, erythema or fluctuance.  PERRL.  EOMI.   Nose is unremarkable.  Posterior oropharynx is clear.  Neck is supple.   ED Results / Procedures / Treatments  Labs (all labs ordered are listed, but only abnormal results are displayed) Labs Reviewed  BASIC METABOLIC PANEL - Abnormal; Notable for the following components:      Result Value   Glucose, Bld 111 (*)    All other components within normal limits  CBC - Abnormal; Notable for the following components:   RDW 16.3 (*)    All other components within normal limits  PROTIME-INR - Abnormal; Notable for the following components:   Prothrombin Time 15.7 (*)    INR 1.3 (*)    All other components within normal limits     EKG  None   RADIOLOGY I have independently visualized and interpreted patient's CT scans as well as noted the radiology interpretation:  CT head: No ICH  CT maxillofacial: Right lateral temporal facial skin thickening and subcu stranding with asymmetric thickening of the right platysma.  No underlying soft tissue gas, hematoma or abscess  Official radiology report(s): CT Head Wo Contrast  Result Date: 10/07/2022 CLINICAL DATA:  Status post right temporal artery biopsy with pain and swelling. Date of procedure 4 days ago EXAM: CT HEAD WITHOUT CONTRAST CT MAXILLOFACIAL WITHOUT CONTRAST TECHNIQUE: Multidetector CT imaging of the head and maxillofacial structures were performed using the standard protocol without intravenous contrast. Multiplanar CT image reconstructions of the maxillofacial structures  were also generated. RADIATION DOSE REDUCTION: This exam was performed according to the departmental dose-optimization program which includes automated exposure control, adjustment of the mA and/or kV according to patient size and/or use of iterative reconstruction technique. COMPARISON:  Head CT dated 06/06/2022 FINDINGS: CT HEAD FINDINGS Brain: There are mild features of cerebral atrophy and small-vessel disease. The ventricles are normal in size and position with  unremarkable cerebellum and brainstem. No asymmetry is seen concerning for a cortical based acute infarct, hemorrhage, mass or mass effect. Vascular: No hyperdense vessel or unexpected calcification. Skull: Negative for fractures or focal lesions. Other: None. CT MAXILLOFACIAL FINDINGS Osseous: No fracture or mandibular dislocation. No destructive process. Orbits: Negative. No traumatic or inflammatory finding. Sinuses: The paranasal sinuses, mastoid air cells and middle ears are clear. Both ostiomeatal complexes are patent. There is mild membrane thickening along the nasal septum and nasal cavity sidewalls with unremarkable turbinates. There is a 1.3 x 1.1 cm defect in the cartilaginous nasal septum, which appears chronic. Soft tissues: No facial hematoma is seen. There is mild skin thickening and subcutaneous stranding over a broad area of the right lateral temporofacial region. No underlying soft tissue gas or abscess is seen. Asymmetric thickening of the platysma is noted on the right. There are few small bilateral intraparotid lymph nodes but these were noted previously. The parotid glands are fatty replaced but otherwise unremarkable. There is no underlying jugular chain, parotid tail or submandibular adenopathy. There are a few subcentimeter in short axis asymmetric right parotid tail lymph nodes which may be reactive. There is increased prominence of the adenoids effacing the nasopharynx from posteriorly, mild symmetric prominence of the palatine tonsils but no underlying abscess. The epiglottis and tongue base are normal. IMPRESSION: 1. No acute intracranial CT findings. 2. Mild atrophy and small-vessel disease. 3. Right lateral temporofacial skin thickening and subcutaneous stranding with asymmetric thickening of the right platysma. No underlying soft tissue gas, hematoma or abscess. 4. Increased prominence of the adenoids effacing the nasopharynx from posteriorly, with mildly prominent palatine tonsils.  No underlying abscess. 5. 1.3 x 1.1 cm defect in the cartilaginous nasal septum. Electronically Signed   By: Telford Nab M.D.   On: 10/07/2022 00:03   CT Maxillofacial WO CM  Result Date: 10/07/2022 CLINICAL DATA:  Status post right temporal artery biopsy with pain and swelling. Date of procedure 4 days ago EXAM: CT HEAD WITHOUT CONTRAST CT MAXILLOFACIAL WITHOUT CONTRAST TECHNIQUE: Multidetector CT imaging of the head and maxillofacial structures were performed using the standard protocol without intravenous contrast. Multiplanar CT image reconstructions of the maxillofacial structures were also generated. RADIATION DOSE REDUCTION: This exam was performed according to the departmental dose-optimization program which includes automated exposure control, adjustment of the mA and/or kV according to patient size and/or use of iterative reconstruction technique. COMPARISON:  Head CT dated 06/06/2022 FINDINGS: CT HEAD FINDINGS Brain: There are mild features of cerebral atrophy and small-vessel disease. The ventricles are normal in size and position with unremarkable cerebellum and brainstem. No asymmetry is seen concerning for a cortical based acute infarct, hemorrhage, mass or mass effect. Vascular: No hyperdense vessel or unexpected calcification. Skull: Negative for fractures or focal lesions. Other: None. CT MAXILLOFACIAL FINDINGS Osseous: No fracture or mandibular dislocation. No destructive process. Orbits: Negative. No traumatic or inflammatory finding. Sinuses: The paranasal sinuses, mastoid air cells and middle ears are clear. Both ostiomeatal complexes are patent. There is mild membrane thickening along the nasal septum and nasal cavity sidewalls with unremarkable turbinates. There  is a 1.3 x 1.1 cm defect in the cartilaginous nasal septum, which appears chronic. Soft tissues: No facial hematoma is seen. There is mild skin thickening and subcutaneous stranding over a broad area of the right lateral  temporofacial region. No underlying soft tissue gas or abscess is seen. Asymmetric thickening of the platysma is noted on the right. There are few small bilateral intraparotid lymph nodes but these were noted previously. The parotid glands are fatty replaced but otherwise unremarkable. There is no underlying jugular chain, parotid tail or submandibular adenopathy. There are a few subcentimeter in short axis asymmetric right parotid tail lymph nodes which may be reactive. There is increased prominence of the adenoids effacing the nasopharynx from posteriorly, mild symmetric prominence of the palatine tonsils but no underlying abscess. The epiglottis and tongue base are normal. IMPRESSION: 1. No acute intracranial CT findings. 2. Mild atrophy and small-vessel disease. 3. Right lateral temporofacial skin thickening and subcutaneous stranding with asymmetric thickening of the right platysma. No underlying soft tissue gas, hematoma or abscess. 4. Increased prominence of the adenoids effacing the nasopharynx from posteriorly, with mildly prominent palatine tonsils. No underlying abscess. 5. 1.3 x 1.1 cm defect in the cartilaginous nasal septum. Electronically Signed   By: Telford Nab M.D.   On: 10/07/2022 00:03     PROCEDURES:  Critical Care performed: No  Procedures   MEDICATIONS ORDERED IN ED: Medications  predniSONE (DELTASONE) tablet 30 mg (has no administration in time range)  morphine (PF) 4 MG/ML injection 4 mg (4 mg Intramuscular Given 10/07/22 0000)     IMPRESSION / MDM / ASSESSMENT AND PLAN / ED COURSE  I reviewed the triage vital signs and the nursing notes.                             51 year old female presenting with right head and facial swelling 3 days status post right temporal artery biopsy on Coumadin.  Differential diagnosis includes but is not limited to seroma, hematoma, allergic reaction, etc.  I have personally reviewed patient's records and note her op note from  09/23/2022.  Patient's presentation is most consistent with acute presentation with potential threat to life or bodily function.  Laboratory results demonstrate normal H/H13.3/40.7; INR is 1.3.  Will administer IM morphine for pain and obtain CT head and maxillofacial.  Unfortunately patient has IV dye allergy; will obtain noncontrast CT scan which should still be able to indicate hemorrhagic debris if present.  Clinical Course as of 10/07/22 0031  Ludwig Clarks Oct 07, 2022  0027 CT head negative for intracranial hemorrhage.  CT maxillofacial demonstrates skin thickening and subcutaneous stranding with asymmetric thickening of the right platysma.  Discussed case with surgery on-call Dr. Hampton Abbot who reviewed images from home.  Agrees with steroid taper, cool compress.  Patient has been uncomfortable sleeping in an upright position as instructed after surgery; she may sleep however she is comfortable now.  She may continue anticoagulation.  We will switch her from [place] to Percocet for better pain control.  Will follow-up with Dr. Peyton Najjar as scheduled.  Strict return precautions given.  Patient and family member verbalized understanding agree with plan of care. [JS]    Clinical Course User Index [JS] Paulette Blanch, MD     FINAL CLINICAL IMPRESSION(S) / ED DIAGNOSES   Final diagnoses:  Postoperative pain  Facial swelling     Rx / DC Orders   ED Discharge Orders  Ordered    methylPREDNISolone (MEDROL DOSEPAK) 4 MG TBPK tablet        10/07/22 0030    oxyCODONE-acetaminophen (PERCOCET/ROXICET) 5-325 MG tablet  Every 4 hours PRN        10/07/22 0030             Note:  This document was prepared using Dragon voice recognition software and may include unintentional dictation errors.   Paulette Blanch, MD 10/07/22 [phone]

## 2022-10-07 MED ORDER — PREDNISONE 20 MG PO TABS
30.0000 mg | ORAL_TABLET | Freq: Once | ORAL | Status: AC
  Administered 2022-10-07: 30 mg via ORAL
  Filled 2022-10-07: qty 1

## 2022-10-07 MED ORDER — METHYLPREDNISOLONE 4 MG PO TBPK: ORAL_TABLET | ORAL | 0 refills | Status: DC

## 2022-10-07 MED ORDER — OXYCODONE-ACETAMINOPHEN 5-325 MG PO TABS: 1.0000 | ORAL_TABLET | ORAL | 0 refills | Status: DC | PRN

## 2022-10-07 NOTE — Discharge Instructions (Addendum)
1.  Take steroid taper as prescribed. 2.  Switch your pain medicine to Percocet to take as needed. 3.  You may sleep in whichever position is most comfortable for you. 4.  Apply cool compress to affected area several times daily to reduce swelling. 5.  Return to the ER for worsening symptoms, persistent vomiting, fever or other concerns.

## 2022-10-27 ENCOUNTER — Other Ambulatory Visit: Payer: Self-pay | Admitting: General Surgery

## 2022-10-27 DIAGNOSIS — Z853 Personal history of malignant neoplasm of breast: Secondary | ICD-10-CM

## 2022-11-16 ENCOUNTER — Other Ambulatory Visit: Payer: Self-pay | Admitting: Gastroenterology

## 2022-11-16 DIAGNOSIS — R935 Abnormal findings on diagnostic imaging of other abdominal regions, including retroperitoneum: Secondary | ICD-10-CM

## 2022-11-22 ENCOUNTER — Ambulatory Visit
Admission: RE | Admit: 2022-11-22 | Discharge: 2022-11-22 | Disposition: A | Discharge status: Home / Self Care | Payer: 59 | Source: Ambulatory Visit | Attending: Gastroenterology | Admitting: Gastroenterology

## 2022-11-22 DIAGNOSIS — R935 Abnormal findings on diagnostic imaging of other abdominal regions, including retroperitoneum: Secondary | ICD-10-CM | POA: Diagnosis present

## 2022-12-07 ENCOUNTER — Other Ambulatory Visit: Payer: Self-pay | Admitting: Gastroenterology

## 2022-12-07 DIAGNOSIS — R935 Abnormal findings on diagnostic imaging of other abdominal regions, including retroperitoneum: Secondary | ICD-10-CM

## 2022-12-15 ENCOUNTER — Other Ambulatory Visit: Payer: 59

## 2022-12-16 NOTE — Progress Notes (Signed)
Patient for US Liver Biopsy on Wed 12/21/2022, I called and spoke with the patient on the phone and gave pre-procedure instructions. Pt was made aware to be here at [place] at the new entrance, last dose of Aspirin '81mg'$  is on Thurs 12/15/2022 and las dose of Elaina Pattee is on Fri 12/16/2022, NPO after MN prior to procedure as well as driver post procedure/recovery/discharge. Pt will be on Lovenox Bridge per Referring Provider and patient.  Pt stated understanding.  Called 12/07/2022 and 12/16/2022

## 2022-12-20 ENCOUNTER — Other Ambulatory Visit: Payer: Self-pay | Admitting: Internal Medicine

## 2022-12-20 DIAGNOSIS — K7460 Unspecified cirrhosis of liver: Secondary | ICD-10-CM

## 2022-12-21 ENCOUNTER — Ambulatory Visit
Admission: RE | Admit: 2022-12-21 | Discharge: 2022-12-21 | Disposition: A | Discharge status: Home / Self Care | Payer: 59 | Source: Ambulatory Visit | Attending: Gastroenterology | Admitting: Gastroenterology

## 2022-12-21 ENCOUNTER — Other Ambulatory Visit: Payer: Self-pay

## 2022-12-21 DIAGNOSIS — Z7901 Long term (current) use of anticoagulants: Secondary | ICD-10-CM | POA: Diagnosis not present

## 2022-12-21 DIAGNOSIS — K7581 Nonalcoholic steatohepatitis (NASH): Secondary | ICD-10-CM | POA: Diagnosis not present

## 2022-12-21 DIAGNOSIS — F419 Anxiety disorder, unspecified: Secondary | ICD-10-CM | POA: Diagnosis not present

## 2022-12-21 DIAGNOSIS — J45909 Unspecified asthma, uncomplicated: Secondary | ICD-10-CM | POA: Insufficient documentation

## 2022-12-21 DIAGNOSIS — I110 Hypertensive heart disease with heart failure: Secondary | ICD-10-CM | POA: Diagnosis not present

## 2022-12-21 DIAGNOSIS — Z7985 Long-term (current) use of injectable non-insulin antidiabetic drugs: Secondary | ICD-10-CM | POA: Insufficient documentation

## 2022-12-21 DIAGNOSIS — E119 Type 2 diabetes mellitus without complications: Secondary | ICD-10-CM | POA: Diagnosis not present

## 2022-12-21 DIAGNOSIS — I509 Heart failure, unspecified: Secondary | ICD-10-CM | POA: Insufficient documentation

## 2022-12-21 DIAGNOSIS — F17210 Nicotine dependence, cigarettes, uncomplicated: Secondary | ICD-10-CM | POA: Insufficient documentation

## 2022-12-21 DIAGNOSIS — K219 Gastro-esophageal reflux disease without esophagitis: Secondary | ICD-10-CM | POA: Diagnosis not present

## 2022-12-21 DIAGNOSIS — K7460 Unspecified cirrhosis of liver: Secondary | ICD-10-CM

## 2022-12-21 DIAGNOSIS — E785 Hyperlipidemia, unspecified: Secondary | ICD-10-CM | POA: Diagnosis not present

## 2022-12-21 DIAGNOSIS — Z7951 Long term (current) use of inhaled steroids: Secondary | ICD-10-CM | POA: Diagnosis not present

## 2022-12-21 DIAGNOSIS — R935 Abnormal findings on diagnostic imaging of other abdominal regions, including retroperitoneum: Secondary | ICD-10-CM | POA: Diagnosis not present

## 2022-12-21 DIAGNOSIS — R7989 Other specified abnormal findings of blood chemistry: Secondary | ICD-10-CM | POA: Diagnosis not present

## 2022-12-21 DIAGNOSIS — Z79899 Other long term (current) drug therapy: Secondary | ICD-10-CM | POA: Diagnosis not present

## 2022-12-21 DIAGNOSIS — I2510 Atherosclerotic heart disease of native coronary artery without angina pectoris: Secondary | ICD-10-CM | POA: Insufficient documentation

## 2022-12-21 LAB — CBC WITH DIFFERENTIAL/PLATELET
Abs Immature Granulocytes: 0.07 10*3/uL (ref 0.00–0.07)
Basophils Absolute: 0.1 10*3/uL (ref 0.0–0.1)
Basophils Relative: 1 %
Eosinophils Absolute: 0.1 10*3/uL (ref 0.0–0.5)
Eosinophils Relative: 2 %
HCT: 42.8 % (ref 36.0–46.0)
Hemoglobin: 13.8 g/dL (ref 12.0–15.0)
Immature Granulocytes: 1 %
Lymphocytes Relative: 27 %
Lymphs Abs: 1.8 10*3/uL (ref 0.7–4.0)
MCH: 29.7 pg (ref 26.0–34.0)
MCHC: 32.2 g/dL (ref 30.0–36.0)
MCV: 92.2 fL (ref 80.0–100.0)
Monocytes Absolute: 0.8 10*3/uL (ref 0.1–1.0)
Monocytes Relative: 11 %
Neutro Abs: 3.9 10*3/uL (ref 1.7–7.7)
Neutrophils Relative %: 58 %
Platelets: 245 10*3/uL (ref 150–400)
RBC: 4.64 MIL/uL (ref 3.87–5.11)
RDW: 15.7 % — ABNORMAL HIGH (ref 11.5–15.5)
WBC: 6.7 10*3/uL (ref 4.0–10.5)
nRBC: 0 % (ref 0.0–0.2)

## 2022-12-21 LAB — PROTIME-INR
INR: 1.2 (ref 0.8–1.2)
Prothrombin Time: 14.9 seconds (ref 11.4–15.2)

## 2022-12-21 LAB — GLUCOSE, CAPILLARY: Glucose-Capillary: 83 mg/dL (ref 70–99)

## 2022-12-21 MED ORDER — FENTANYL CITRATE (PF) 100 MCG/2ML IJ SOLN
INTRAMUSCULAR | Status: AC | PRN
  Administered 2022-12-21: 25 ug via INTRAVENOUS

## 2022-12-21 MED ORDER — FENTANYL CITRATE (PF) 100 MCG/2ML IJ SOLN
INTRAMUSCULAR | Status: AC
  Filled 2022-12-21: qty 4

## 2022-12-21 MED ORDER — MIDAZOLAM HCL 2 MG/2ML IJ SOLN
INTRAMUSCULAR | Status: AC
  Filled 2022-12-21: qty 4

## 2022-12-21 MED ORDER — SODIUM CHLORIDE 0.9 % IV SOLN: INTRAVENOUS | Status: DC

## 2022-12-21 MED ORDER — LIDOCAINE HCL (PF) 1 % IJ SOLN
7.0000 mL | Freq: Once | INTRAMUSCULAR | Status: AC
  Administered 2022-12-21: 7 mL via SUBCUTANEOUS
  Filled 2022-12-21: qty 8

## 2022-12-21 MED ORDER — MIDAZOLAM HCL 2 MG/2ML IJ SOLN
INTRAMUSCULAR | Status: AC | PRN
  Administered 2022-12-21: .5 mg via INTRAVENOUS

## 2022-12-21 NOTE — Procedures (Signed)
Interventional Radiology Procedure Note  Procedure: [place] RANDOM RT LIVER CORE BX    Complications: None  Estimated Blood Loss:  0  Findings: 86 G CORE X 2    M. Daryll Brod, MD

## 2022-12-21 NOTE — H&P (Signed)
Chief Complaint: Patient was seen in consultation today for possible cirrhosis  Referring Physician(s): Ezzard Standing  Supervising Physician: Daryll Brod  Patient Status: [facility] - Out-pt  History of Present Illness: MERIN BORJON is a 52 y.o. female with past medical history of anxiety, asthma, CHF, CAD, GERD, HLD, HTN, DM2, NASH with concern on recent abdominal imaging for cirrhosis.  IR consulted for liver biopsy at the request of Laurine Blazer, PA-C. Case reviewed and approved by Dr. Maryelizabeth Kaufmann.   Patient presents to [facility] today in her usual state of health.  She has been NPO.  She does take warfarin for her history of valve replacement.  She last took warfarin 1/26 and has bridged with lovenox. Her last dose of lovenox was yesterday morning.  [name] understanding of the goals of the procedure today and is agreeable to proceed. Her husband is available for care and transportation today.   Past Medical History:  Diagnosis Date   Anemia    Anxiety    a.) on BZO (diazepam) PRN   Aortic atherosclerosis (HCC)    Aortic valve stenosis and insufficiency, rheumatic    a.) s/p AVR 04/03/2015 - 19 mm St. Jude mechanical valve   Arthritis    Asthma    ATN (acute tubular necrosis) (HCC)    Cardiomegaly    CHF (congestive heart failure) ([place])    a.) TTE 08/13/2022: EF >55%, mod LAE, mild RV/RAE, triv-mild panvalvular regurgitation, G3DD.   Chronic anticoagulation    Chronic constipation    a.) on linaclotide   Chronic lower back pain    Cirrhosis (HCC)    Coronary artery calcification seen on CT scan    a.) LHC 12/05/2014: normal coronaries   Depression    DOE (dyspnea on exertion)    Emphysema of lung (HCC)    GERD (gastroesophageal reflux disease)    Helicobacter pylori infection    History of hiatal hernia    History of methicillin resistant staphylococcus aureus (MRSA)    HTN (hypertension)    Hyperaldosteronism (HCC)    Hyperlipidemia    Hypertension    IBS  (irritable bowel syndrome)    Long term current use of anticoagulant    a.) warfarin   Lumbar facet joint syndrome    Migraines    Mitral stenosis with insufficiency, rheumatic    a.) s/p MVR 04/03/2015 - 25 mm St. 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 Jude Regent mechanical valve); Location: [facility]; Surgeon: Monia Pouch, MD   ARTERY BIOPSY N/A 10/03/2022   Procedure: BIOPSY TEMPORAL ARTERY;  Surgeon: Herbert Pun, MD;  Location: [facility] ORS;  Service: General;  Laterality: N/A;  BREAST BIOPSY Left 07/25/2017   BENIGN BREAST TISSUE WITH MICROCALCIFICATIONS   BREAST BIOPSY Right 10/22/2019   Affirm Bx- X-clip- CSL, no atypia   BREAST BIOPSY Right 10/22/2019   Affirm Bx- Ribbon clip- CSL, no atypia   BREAST BIOPSY Right 11/20/2019   Procedure: BREAST BIOPSY WITH NEEDLE LOCALIZATION x 2;  Surgeon: Herbert Pun, MD;  Location: [facility] ORS;  Service: General;  Laterality: Right;   BREAST EXCISIONAL BIOPSY Right 1992   NEG   BREAST LUMPECTOMY Right 11/20/2019   complex sclerosing lesion x 2   CESAREAN SECTION N/A 2000   CESAREAN SECTION N/A 1987   CHOLECYSTECTOMY  07/2020   COLONOSCOPY WITH PROPOFOL N/A  09/21/2020   Procedure: COLONOSCOPY WITH PROPOFOL;  Surgeon: Toledo, Benay Pike, MD;  Location: [facility] END[facility];  Service: Gastroenterology;  Laterality: N/A;   ESOPHAGOGASTRODUODENOSCOPY (EGD) WITH PROPOFOL N/A 10/07/2016   Procedure: ESOPHAGOGASTRODUODENOSCOPY (EGD) WITH PROPOFOL;  Surgeon: Jonathon Bellows, MD;  Location: [facility] ENDOS[facility];  Service: Endoscopy;  Laterality: N/A;   ESOPHAGOGASTRODUODENOSCOPY (EGD) WITH PROPOFOL N/A 09/21/2020   Procedure: ESOPHAGOGASTRODUODENOSCOPY (EGD) WITH PROPOFOL;  Surgeon: Toledo, Benay Pike, MD;  Location: [facility] ENDOS[facility];  Service: Gastroenterology;  Laterality: N/A;   LAPAROSCOPIC GASTRIC BAND REMOVAL WITH LAPAROSCOPIC GASTRIC SLEEVE RESECTION     LEFT HEART CATH AND CORONARY ANGIOGRAPHY Left 12/05/2014   Procedure: LEFT HEART CATH AND CORONARY ANGIOGRAPHY; Location: [facility]; Surgeon: Serafina Royals, MD   LIPOSUCTION TRUNK  1996   MITRAL VALVE REPLACEMENT  04/03/2015   Procedure: MITRAL VALVE REPLACEMENT (25 mm St. Jude Mechanical valve); Location: [facility]; Surgeon: Monia Pouch, MD   TRICUSPID VALVE REPLACEMENT  04/03/2015   Procedure: TRICUSPID VALVE REPLACEMENT (21 mm Mosaic porcine valve); Location: [facility]; Surgeon: Monia Pouch, MD   TUBAL LIGATION  6644   UMBILICAL HERNIA REPAIR  2005   VENTRAL HERNIA REPAIR      Allergies: Ivp dye [iodinated contrast media], Sulfa antibiotics, Banana, Guaifenesin, Hctz [hydrochlorothiazide], Keflex [cephalexin], Losartan, Povidone iodine, Zofran [ondansetron], Lisinopril, and Mucinex [guaifenesin er]  Medications: Prior to Admission medications   Medication Sig Start Date End Date Taking? Authorizing Provider  albuterol (VENTOLIN HFA) 108 (90 Base) MCG/ACT inhaler Inhale 2 puffs into the lungs every 6 (six) hours as needed for wheezing or shortness of breath.   Yes [provider]  candesartan (ATACAND) 8 MG tablet Take 8 mg by mouth at bedtime.  12/07/19  Yes [provider]  Cranberry-Vitamin C (CRANBERRY  CONCENTRATE/VITAMINC PO) Take 2 capsules by mouth daily.   Yes [provider]  cyclobenzaprine (FLEXERIL) 10 MG tablet Take 1 tablet by mouth 3 (three) times daily as needed. 04/10/19  Yes [provider]  diazepam (VALIUM) 5 MG tablet Take 5 mg by mouth every 12 (twelve) hours as needed for anxiety or muscle spasms.   Yes [provider]  enoxaparin (LOVENOX) 120 MG/0.8ML injection Inject 120 mg into the skin 2 (two) times daily. Coumadin/lovenox bridge for temporal artery biopsy surgery. Following calendar from Dr. Nehemiah Massed   Yes [provider]  fluticasone Asencion Islam) 50 MCG/ACT nasal spray Place 2 sprays into both nostrils daily as needed for allergies or rhinitis.   Yes [provider]  HYDROcodone-acetaminophen (NORCO/VICODIN) 5-325 MG tablet Take 1 tablet by mouth every 6 (six) hours as needed for moderate pain.   Yes [provider]  hydrocortisone 2.5 % cream Apply 1 application  topically 2 (two) times daily as needed.   Yes [provider]  ipratropium-albuterol (DUONEB) 0.5-2.5 (3) MG/3ML SOLN Take 3 mLs  by nebulization every 6 (six) hours as needed.   Yes [provider]  iron polysaccharides (NIFEREX) 150 MG capsule Take 150 mg by mouth daily.   Yes [provider]  levalbuterol (XOPENEX) 1.25 MG/0.5ML nebulizer solution Take 1.25 mg by nebulization every 8 (eight) hours as needed for wheezing or shortness of breath.   Yes [provider]  levocetirizine (XYZAL) 5 MG tablet Take 5 mg by mouth every evening.  05/10/17  Yes [provider]  linaclotide (LINZESS) 72 MCG capsule Take 72 mcg by mouth daily as needed.   Yes [provider]  meclizine (ANTIVERT) 25 MG tablet Take 25 mg by mouth every 6 (six) hours as needed for dizziness. 05/11/15  Yes [provider]  pregabalin (LYRICA) 75 MG capsule Take 75 mg by mouth 2 (two) times daily.   Yes [provider]   amoxicillin (AMOXIL) 500 MG capsule Take 2,000 mg by mouth once. Prior to dental or surgery    [provider]  aspirin EC 81 MG tablet Take 81 mg daily by mouth.    [provider]  gabapentin (NEURONTIN) 300 MG capsule Take by mouth 2 (two) times daily. Patient not taking: Reported on 12/21/2022 01/05/22   [provider]  Galcanezumab-gnlm [facility]) Inject into the skin. Patient not taking: Reported on 09/29/2022    [provider]  Galcanezumab-gnlm 120 MG/ML SOSY Inject 120 mg into the skin every 30 (thirty) days.  Patient not taking: Reported on 07/05/2022    [provider]  memantine (NAMENDA) 5 MG tablet Take 5 mg by mouth 2 (two) times daily.     [provider]  methylPREDNISolone (MEDROL DOSEPAK) 4 MG TBPK tablet Take as directed 10/07/22   Paulette Blanch, MD  montelukast (SINGULAIR) 10 MG tablet Take 10 mg by mouth at bedtime.    [provider]  nystatin (MYCOSTATIN/NYSTOP) powder Apply 1 Application topically in the morning and at bedtime.    [provider]  oxyCODONE-acetaminophen (PERCOCET/ROXICET) 5-325 MG tablet Take 1 tablet by mouth every 4 (four) hours as needed for severe pain. 10/07/22   Paulette Blanch, MD  OZEMPIC, 0.25 OR 0.5 MG/DOSE, 2 MG/3ML SOPN Inject 0.5 mg into the skin once a week. sunday 04/04/22   [provider]  pravastatin (PRAVACHOL) 20 MG tablet Take 20 mg by mouth at bedtime.  12/07/14   [provider]  spironolactone (ALDACTONE) 25 MG tablet Take 25 mg by mouth daily.     [provider]  spironolactone (ALDACTONE) 50 MG tablet Take 50 mg by mouth daily.    [provider]  Vitamin D, Ergocalciferol, (DRISDOL) 1.25 MG (50000 UNIT) CAPS capsule Take 50,000 Units by mouth once a week. sunday 06/10/22   [provider]  warfarin (COUMADIN) 1 MG tablet Take 1 mg by mouth.    [provider]  warfarin (COUMADIN) 4 MG tablet Take 4 mg by  mouth.    [provider]  warfarin (COUMADIN) 6 MG tablet Take 6 mg by mouth.    [provider]  warfarin (COUMADIN) 7.5 MG tablet Take 1 tablet (7.5 mg total) one time only at 6 PM by mouth. Patient taking differently: Take 7.5 mg by mouth. Coumadin dosage according to lab results 10/04/17   Bettey Costa, MD     Family History  Problem Relation Age of Onset   Thyroid cancer Mother    Uterine cancer Mother    Breast cancer Paternal Grandmother  Lung cancer Paternal Grandmother    Pancreatic cancer Cousin     Social History   Socioeconomic History   Marital status: Married    Spouse name: Terrance   Number of children: 2   Years of education: Not on file   Highest education level: Not on file  Occupational History   Not on file  Tobacco Use   Smoking status: Every Day    Packs/day: 0.50    Years: 25.00    Total pack years: 12.50    Types: Cigarettes   Smokeless tobacco: Never  Vaping Use   Vaping Use: Never used  Substance and Sexual Activity   Alcohol use: No   Drug use: No   Sexual activity: Not on file  Other Topics Concern   Not on file  Social History Narrative   Not on file   Social Determinants of Health   Financial Resource Strain: Medium Risk (10/02/2017)   Overall Financial Resource Strain (CARDIA)    Difficulty of Paying Living Expenses: Somewhat hard  Food Insecurity: Food Insecurity Present (10/02/2017)   Hunger Vital Sign    Worried About Running Out of Food in the Last Year: Often true    Ran Out of Food in the Last Year: Often true  Transportation Needs: No Transportation Needs (10/02/2017)   PRAPARE - Hydrologist (Medical): No    Lack of Transportation (Non-Medical): No  Physical Activity: Unknown (10/02/2017)   Exercise Vital Sign    Days of Exercise per Week: 0 days    Minutes of Exercise per Session: Not on file  Stress: Stress Concern Present (10/02/2017)   [place]    Feeling of Stress : Very much  Social Connections: Socially Integrated (10/02/2017)   Social Connection and Isolation Panel [NHANES]    Frequency of Communication with Friends and Family: More than three times a week    Frequency of Social Gatherings with Friends and Family: Once a week    Attends Religious Services: More than 4 times per year    Active Member of [employer] or Organizations: Yes    Attends Music therapist: More than 4 times per year    Marital Status: Married     Review of Systems: A 12 point ROS discussed and pertinent positives are indicated in the HPI above.  All other systems are negative.  Review of Systems  Constitutional:  Negative for fatigue and fever.  Respiratory:  Negative for cough and shortness of breath.   Cardiovascular:  Negative for chest pain.  Gastrointestinal:  Negative for abdominal pain and nausea.  Musculoskeletal:  Negative for back pain.  Psychiatric/Behavioral:  Negative for behavioral problems and confusion.     Vital Signs: BP 103/88   Pulse 69   Temp 98.2 F (36.8 C) (Oral)   Resp 18   Ht '5\' 2"'$  (1.575 m)   Wt 222 lb (100.7 kg)   SpO2 99%   BMI 40.60 kg/m   Physical Exam Vitals and nursing note reviewed.  Constitutional:      General: [name] not in acute distress.    Appearance: Normal appearance. [name] not ill-appearing.  HENT:     Mouth/Throat:     Mouth: Mucous membranes are moist.     Pharynx: Oropharynx is clear.  Cardiovascular:     Rate and Rhythm: Normal rate and regular rhythm.  Pulmonary:     Effort: Pulmonary effort is normal.  Breath sounds: Normal breath sounds.  Abdominal:     General: Abdomen is flat.     Palpations: Abdomen is soft.  Skin:    General: Skin is warm and dry.  Neurological:     General: No focal deficit present.     Mental Status: [name] alert and oriented to person, place, and time. Mental status is at baseline.   Psychiatric:        Mood and Affect: Mood normal.        Behavior: Behavior normal.        Thought Content: Thought content normal.        Judgment: Judgment normal.      MD Evaluation Airway: WNL Heart: WNL Abdomen: WNL Chest/ Lungs: WNL ASA  Classification: 3 Mallampati/Airway Score: Two   Imaging: [place] ELASTOGRAPHY LIVER  Result Date: 11/23/2022 CLINICAL DATA:  Cirrhosis on CT EXAM: [place] ELASTOGRAPHY HEPATIC TECHNIQUE: Sonography of the liver was performed. In addition, ultrasound elastography evaluation of the liver was performed. A region of interest was placed within the right lobe of the liver. Following application of a compressive sonographic pulse, tissue compressibility was assessed. Multiple assessments were performed at the selected site. Median tissue compressibility was determined. Previously, hepatic stiffness was assessed by shear wave velocity. Based on recently published Society of Radiologists in Ultrasound consensus article, reporting is now recommended to be performed in the SI units of pressure (kiloPascals) representing hepatic stiffness/elasticity. The obtained result is compared to the published reference standards. (cACLD = compensated Advanced Chronic Liver Disease) COMPARISON:  CT July 12, 2022. FINDINGS: Liver: No focal lesion identified. Subtle hepatic contour nodularity with diffusely increased parenchymal echogenicity. Portal vein is patent on color Doppler imaging with normal direction of blood flow towards the liver. ULTRASOUND HEPATIC ELASTOGRAPHY Device: Siemens Helix VTQ Patient position: Oblique Transducer: 5C1 Number of measurements: 10 Hepatic segment:  8 Median kPa: 11.5 IQR: 2.2 IQR/Median kPa ratio: 0.2 Data quality:  Good Diagnostic category: >9 kPa and ?13 kPa: suggestive of cACLD, but needs further testing The use of hepatic elastography is applicable to patients with viral hepatitis and non-alcoholic fatty liver disease. At this time, there is  insufficient data for the referenced cut-off values and use in other causes of liver disease, including alcoholic liver disease. Patients, however, may be assessed by elastography and serve as their own reference standard/baseline. In patients with non-alcoholic liver disease, the values suggesting compensated advanced chronic liver disease (cACLD) may be lower, and patients may need additional testing with elasticity results of 7-9 kPa. Please note that abnormal hepatic elasticity and shear wave velocities may also be identified in clinical settings other than with hepatic fibrosis, such as: acute hepatitis, elevated right heart and central venous pressures including use of beta blockers, veno-occlusive disease (Budd-Chiari), infiltrative processes such as mastocytosis/amyloidosis/infiltrative tumor/lymphoma, extrahepatic cholestasis, with hyperemia in the post-prandial state, and with liver transplantation. Correlation with patient history, laboratory data, and clinical condition recommended. Diagnostic Categories: < or =5 kPa: high probability of being normal < or =9 kPa: in the absence of other known clinical signs, rules out cACLD >9 kPa and ?13 kPa: suggestive of cACLD, but needs further testing >13 kPa: highly suggestive of cACLD > or =17 kPa: highly suggestive of cACLD with an increased probability of clinically significant portal hypertension IMPRESSION: ULTRASOUND LIVER: Subtle hepatic contour nodularity with diffusely increased echogenicity, suggestive of cirrhosis. ULTRASOUND HEPATIC ELASTOGRAPHY: Median kPa:  11.5 Diagnostic category: >9 kPa and ?13 kPa: suggestive of cACLD, but needs further testing  Electronically Signed   By: Dahlia Bailiff M.D.   On: 11/23/2022 13:15    Labs:  CBC: Recent Labs    06/06/22 2315 10/06/22 1945  WBC 7.3 8.5  HGB 12.8 13.2  HCT 41.7 40.7  PLT 261 252    COAGS: Recent Labs    06/06/22 2315 10/03/22 1115 10/06/22 1945  INR 2.5* 1.4* 1.3*     BMP: Recent Labs    06/06/22 2315 09/30/22 0842 10/06/22 1945  NA 141 138 137  K 3.5 3.8 3.9  CL 111 108 106  CO2 '22 22 24  '$ GLUCOSE 101* 115* 111*  BUN '9 12 10  '$ CALCIUM 9.4 9.6 9.2  CREATININE 0.91 0.88 1.00  GFRNONAA >60 >60 >60    LIVER FUNCTION TESTS: No results for input(s): "BILITOT", "AST", "ALT", "ALKPHOS", "PROT", "ALBUMIN" in the last 8760 hours.  TUMOR MARKERS: No results for input(s): "AFPTM", "CEA", "CA199", "CHROMGRNA" in the last 8760 hours.  Assessment and Plan: Patient with past medical history of HTN, CHF, CAD, DM, NASH presents with complaint of abnormal [place] and CT suggesting cirrhosis.  Fortunately, her liver function remains intact, no elevated LFTs  IR consulted for liver biopsy at the request of Arnetha Courser, PA-C. Case reviewed by Dr. Maryelizabeth Kaufmann who approves patient for procedure.  Patient presents today in their usual state of health.  She has been NPO and is not currently on blood thinners.   Risks and benefits of liver biopsy was discussed with the patient and/or patient's family including, but not limited to bleeding, infection, damage to adjacent structures or low yield requiring additional tests.  All of the questions were answered and there is agreement to proceed.  Consent signed and in chart.   Thank you for this interesting consult.  I greatly enjoyed meeting NANEA JARED and look forward to participating in their care.  A copy of this report was sent to the requesting provider on this date.  Electronically Signed: Docia Barrier, PA 12/21/2022, 8:53 AM   I spent a total of  30 Minutes   in face to face in clinical consultation, greater than 50% of which was counseling/coordinating care for NASH, abnormal [place] of abdomen.

## 2022-12-22 ENCOUNTER — Other Ambulatory Visit: Payer: 59

## 2022-12-23 LAB — SURGICAL PATHOLOGY

## 2022-12-27 ENCOUNTER — Ambulatory Visit
Admission: RE | Admit: 2022-12-27 | Discharge: 2022-12-27 | Disposition: A | Discharge status: Home / Self Care | Payer: 59 | Source: Ambulatory Visit | Attending: General Surgery | Admitting: General Surgery

## 2022-12-27 DIAGNOSIS — Z853 Personal history of malignant neoplasm of breast: Secondary | ICD-10-CM

## 2023-02-13 ENCOUNTER — Ambulatory Visit
Admission: RE | Admit: 2023-02-13 | Discharge: 2023-02-13 | Disposition: A | Discharge status: Home / Self Care | Payer: 59 | Source: Ambulatory Visit | Attending: Acute Care | Admitting: Acute Care

## 2023-02-13 DIAGNOSIS — F17210 Nicotine dependence, cigarettes, uncomplicated: Secondary | ICD-10-CM | POA: Insufficient documentation

## 2023-02-13 DIAGNOSIS — Z87891 Personal history of nicotine dependence: Secondary | ICD-10-CM | POA: Insufficient documentation

## 2023-03-17 ENCOUNTER — Telehealth: Payer: Self-pay | Admitting: Acute Care

## 2023-03-17 ENCOUNTER — Other Ambulatory Visit: Payer: Self-pay

## 2023-03-17 DIAGNOSIS — Z87891 Personal history of nicotine dependence: Secondary | ICD-10-CM

## 2023-03-17 DIAGNOSIS — F17210 Nicotine dependence, cigarettes, uncomplicated: Secondary | ICD-10-CM

## 2023-03-17 NOTE — Telephone Encounter (Signed)
I have called the patient with the results of her low-dose screening CT.  I explained that there is 1 nodule that has shown some growth in the last 12 months it is a solid nodule that has grown from 9.8 mm to 10.4 mm in the last 12 months.  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 Denise please place order for low-dose screening CT follow-up in 6 months Please fax results to PCP and explained where they do a 31-month follow-up based on some interval growth of a solid nodule. Thanks so much

## 2023-03-17 NOTE — Telephone Encounter (Signed)
Order placed for 6 months follow up LDCT for nodule. Results/plan faxed to PCP with note regarding 6 months recommendation by pulmonary

## 2023-08-16 ENCOUNTER — Ambulatory Visit: Payer: Medicare Other

## 2023-08-23 ENCOUNTER — Ambulatory Visit
Admission: RE | Admit: 2023-08-23 | Discharge: 2023-08-23 | Disposition: A | Discharge status: Home / Self Care | Payer: Medicare HMO | Source: Ambulatory Visit | Attending: Acute Care | Admitting: Acute Care

## 2023-08-23 DIAGNOSIS — I10 Essential (primary) hypertension: Secondary | ICD-10-CM | POA: Diagnosis not present

## 2023-08-23 DIAGNOSIS — I700 Atherosclerosis of aorta: Secondary | ICD-10-CM | POA: Diagnosis not present

## 2023-08-23 DIAGNOSIS — F17210 Nicotine dependence, cigarettes, uncomplicated: Secondary | ICD-10-CM | POA: Diagnosis not present

## 2023-08-23 DIAGNOSIS — E119 Type 2 diabetes mellitus without complications: Secondary | ICD-10-CM | POA: Diagnosis not present

## 2023-08-23 DIAGNOSIS — I2584 Coronary atherosclerosis due to calcified coronary lesion: Secondary | ICD-10-CM | POA: Diagnosis not present

## 2023-08-23 DIAGNOSIS — J3089 Other allergic rhinitis: Secondary | ICD-10-CM | POA: Diagnosis not present

## 2023-08-23 DIAGNOSIS — Z87891 Personal history of nicotine dependence: Secondary | ICD-10-CM | POA: Insufficient documentation

## 2023-08-23 DIAGNOSIS — R911 Solitary pulmonary nodule: Secondary | ICD-10-CM | POA: Insufficient documentation

## 2023-08-23 DIAGNOSIS — M797 Fibromyalgia: Secondary | ICD-10-CM | POA: Diagnosis not present

## 2023-08-23 DIAGNOSIS — Z7901 Long term (current) use of anticoagulants: Secondary | ICD-10-CM | POA: Diagnosis not present

## 2023-08-23 DIAGNOSIS — Z23 Encounter for immunization: Secondary | ICD-10-CM | POA: Diagnosis not present

## 2023-08-23 DIAGNOSIS — G4733 Obstructive sleep apnea (adult) (pediatric): Secondary | ICD-10-CM | POA: Diagnosis not present

## 2023-08-23 DIAGNOSIS — M170 Bilateral primary osteoarthritis of knee: Secondary | ICD-10-CM | POA: Diagnosis not present

## 2023-08-23 DIAGNOSIS — R791 Abnormal coagulation profile: Secondary | ICD-10-CM | POA: Diagnosis not present

## 2023-08-23 DIAGNOSIS — Z952 Presence of prosthetic heart valve: Secondary | ICD-10-CM | POA: Diagnosis not present

## 2023-08-23 DIAGNOSIS — J439 Emphysema, unspecified: Secondary | ICD-10-CM | POA: Diagnosis not present

## 2023-08-23 DIAGNOSIS — I2510 Atherosclerotic heart disease of native coronary artery without angina pectoris: Secondary | ICD-10-CM | POA: Diagnosis not present

## 2023-08-23 DIAGNOSIS — M79601 Pain in right arm: Secondary | ICD-10-CM | POA: Diagnosis not present

## 2023-08-23 DIAGNOSIS — M5416 Radiculopathy, lumbar region: Secondary | ICD-10-CM | POA: Diagnosis not present

## 2023-08-24 DIAGNOSIS — M25552 Pain in left hip: Secondary | ICD-10-CM | POA: Diagnosis not present

## 2023-08-24 DIAGNOSIS — M5416 Radiculopathy, lumbar region: Secondary | ICD-10-CM | POA: Diagnosis not present

## 2023-08-24 DIAGNOSIS — M25551 Pain in right hip: Secondary | ICD-10-CM | POA: Diagnosis not present

## 2023-08-24 DIAGNOSIS — G8929 Other chronic pain: Secondary | ICD-10-CM | POA: Diagnosis not present

## 2023-09-06 ENCOUNTER — Other Ambulatory Visit: Payer: Self-pay | Admitting: Emergency Medicine

## 2023-09-06 DIAGNOSIS — Z122 Encounter for screening for malignant neoplasm of respiratory organs: Secondary | ICD-10-CM

## 2023-09-06 DIAGNOSIS — Z87891 Personal history of nicotine dependence: Secondary | ICD-10-CM

## 2023-09-08 DIAGNOSIS — Z7901 Long term (current) use of anticoagulants: Secondary | ICD-10-CM | POA: Diagnosis not present

## 2023-09-09 DIAGNOSIS — K029 Dental caries, unspecified: Secondary | ICD-10-CM | POA: Diagnosis not present

## 2023-09-09 DIAGNOSIS — Z7901 Long term (current) use of anticoagulants: Secondary | ICD-10-CM | POA: Diagnosis not present

## 2023-09-09 DIAGNOSIS — Z954 Presence of other heart-valve replacement: Secondary | ICD-10-CM | POA: Diagnosis not present

## 2023-09-09 DIAGNOSIS — K047 Periapical abscess without sinus: Secondary | ICD-10-CM | POA: Diagnosis not present

## 2023-09-09 DIAGNOSIS — R6884 Jaw pain: Secondary | ICD-10-CM | POA: Diagnosis not present

## 2023-09-13 DIAGNOSIS — R791 Abnormal coagulation profile: Secondary | ICD-10-CM | POA: Diagnosis not present

## 2023-09-22 DIAGNOSIS — Z7901 Long term (current) use of anticoagulants: Secondary | ICD-10-CM | POA: Diagnosis not present

## 2023-09-27 ENCOUNTER — Observation Stay
Admission: EM | Admit: 2023-09-27 | Discharge: 2023-09-28 | Disposition: A | Discharge status: Home / Self Care | Payer: Medicare HMO | Attending: Internal Medicine | Admitting: Internal Medicine

## 2023-09-27 ENCOUNTER — Other Ambulatory Visit: Payer: Self-pay

## 2023-09-27 ENCOUNTER — Encounter: Payer: Self-pay | Admitting: Emergency Medicine

## 2023-09-27 ENCOUNTER — Emergency Department: Payer: Medicare HMO

## 2023-09-27 DIAGNOSIS — J45909 Unspecified asthma, uncomplicated: Secondary | ICD-10-CM | POA: Insufficient documentation

## 2023-09-27 DIAGNOSIS — Z952 Presence of prosthetic heart valve: Secondary | ICD-10-CM | POA: Diagnosis not present

## 2023-09-27 DIAGNOSIS — I5042 Chronic combined systolic (congestive) and diastolic (congestive) heart failure: Secondary | ICD-10-CM | POA: Diagnosis not present

## 2023-09-27 DIAGNOSIS — F17210 Nicotine dependence, cigarettes, uncomplicated: Secondary | ICD-10-CM | POA: Diagnosis not present

## 2023-09-27 DIAGNOSIS — K7460 Unspecified cirrhosis of liver: Secondary | ICD-10-CM | POA: Diagnosis not present

## 2023-09-27 DIAGNOSIS — I480 Paroxysmal atrial fibrillation: Secondary | ICD-10-CM | POA: Diagnosis not present

## 2023-09-27 DIAGNOSIS — E66813 Obesity, class 3: Secondary | ICD-10-CM | POA: Diagnosis present

## 2023-09-27 DIAGNOSIS — G4733 Obstructive sleep apnea (adult) (pediatric): Secondary | ICD-10-CM | POA: Diagnosis not present

## 2023-09-27 DIAGNOSIS — I2510 Atherosclerotic heart disease of native coronary artery without angina pectoris: Secondary | ICD-10-CM | POA: Insufficient documentation

## 2023-09-27 DIAGNOSIS — Z7901 Long term (current) use of anticoagulants: Secondary | ICD-10-CM | POA: Diagnosis not present

## 2023-09-27 DIAGNOSIS — I110 Hypertensive heart disease with heart failure: Secondary | ICD-10-CM | POA: Diagnosis not present

## 2023-09-27 DIAGNOSIS — Z79899 Other long term (current) drug therapy: Secondary | ICD-10-CM | POA: Insufficient documentation

## 2023-09-27 DIAGNOSIS — Z954 Presence of other heart-valve replacement: Secondary | ICD-10-CM | POA: Diagnosis not present

## 2023-09-27 DIAGNOSIS — I10 Essential (primary) hypertension: Secondary | ICD-10-CM | POA: Diagnosis not present

## 2023-09-27 DIAGNOSIS — I4892 Unspecified atrial flutter: Secondary | ICD-10-CM | POA: Diagnosis not present

## 2023-09-27 DIAGNOSIS — R002 Palpitations: Secondary | ICD-10-CM | POA: Diagnosis not present

## 2023-09-27 DIAGNOSIS — Z7982 Long term (current) use of aspirin: Secondary | ICD-10-CM | POA: Diagnosis not present

## 2023-09-27 DIAGNOSIS — E119 Type 2 diabetes mellitus without complications: Secondary | ICD-10-CM

## 2023-09-27 DIAGNOSIS — R918 Other nonspecific abnormal finding of lung field: Secondary | ICD-10-CM | POA: Diagnosis not present

## 2023-09-27 DIAGNOSIS — I483 Typical atrial flutter: Secondary | ICD-10-CM | POA: Diagnosis not present

## 2023-09-27 LAB — BASIC METABOLIC PANEL
Anion gap: 9 (ref 5–15)
BUN: 8 mg/dL (ref 6–20)
CO2: 21 mmol/L — ABNORMAL LOW (ref 22–32)
Calcium: 9.3 mg/dL (ref 8.9–10.3)
Chloride: 106 mmol/L (ref 98–111)
Creatinine, Ser: 0.65 mg/dL (ref 0.44–1.00)
GFR, Estimated: >60 mL/min (ref >60)
Glucose, Bld: 77 mg/dL (ref 70–99)
Potassium: 3.6 mmol/L (ref 3.5–5.1)
Sodium: 136 mmol/L (ref 135–145)

## 2023-09-27 LAB — CBC
HCT: 46.9 % — ABNORMAL HIGH (ref 36.0–46.0)
Hemoglobin: 15 g/dL (ref 12.0–15.0)
MCH: 29.4 pg (ref 26.0–34.0)
MCHC: 32 g/dL (ref 30.0–36.0)
MCV: 91.8 fL (ref 80.0–100.0)
Platelets: 292 10*3/uL (ref 150–400)
RBC: 5.11 MIL/uL (ref 3.87–5.11)
RDW: 16.1 % — ABNORMAL HIGH (ref 11.5–15.5)
WBC: 5.2 10*3/uL (ref 4.0–10.5)
nRBC: 0 % (ref 0.0–0.2)

## 2023-09-27 LAB — TROPONIN I (HIGH SENSITIVITY)
Troponin I (High Sensitivity): 13 ng/L (ref <18)
Troponin I (High Sensitivity): 14 ng/L (ref <18)

## 2023-09-27 LAB — CBG MONITORING, ED: Glucose-Capillary: 80 mg/dL (ref 70–99)

## 2023-09-27 LAB — PROTIME-INR
INR: 1.9 — ABNORMAL HIGH (ref 0.8–1.2)
Prothrombin Time: 21.6 s — ABNORMAL HIGH (ref 11.4–15.2)

## 2023-09-27 LAB — HEPARIN LEVEL (UNFRACTIONATED): Heparin Unfractionated: 0.16 [IU]/mL — ABNORMAL LOW (ref 0.30–0.70)

## 2023-09-27 LAB — GLUCOSE, CAPILLARY: Glucose-Capillary: 80 mg/dL (ref 70–99)

## 2023-09-27 LAB — MAGNESIUM: Magnesium: 2.1 mg/dL (ref 1.7–2.4)

## 2023-09-27 MED ORDER — METOPROLOL TARTRATE 5 MG/5ML IV SOLN
5.0000 mg | Freq: Once | INTRAVENOUS | Status: AC
  Administered 2023-09-27: 5 mg via INTRAVENOUS
  Filled 2023-09-27: qty 5

## 2023-09-27 MED ORDER — SPIRONOLACTONE 25 MG PO TABS
75.0000 mg | ORAL_TABLET | Freq: Every day | ORAL | Status: DC
  Administered 2023-09-27 – 2023-09-28 (×2): 75 mg via ORAL
  Filled 2023-09-27 (×2): qty 3

## 2023-09-27 MED ORDER — POLYSACCHARIDE IRON COMPLEX 150 MG PO CAPS
150.0000 mg | ORAL_CAPSULE | Freq: Every day | ORAL | Status: DC
  Administered 2023-09-27 – 2023-09-28 (×2): 150 mg via ORAL
  Filled 2023-09-27 (×2): qty 1

## 2023-09-27 MED ORDER — PANTOPRAZOLE SODIUM 20 MG PO TBEC
20.0000 mg | DELAYED_RELEASE_TABLET | Freq: Every day | ORAL | Status: DC
  Administered 2023-09-27 – 2023-09-28 (×2): 20 mg via ORAL
  Filled 2023-09-27 (×2): qty 1

## 2023-09-27 MED ORDER — PRAVASTATIN SODIUM 20 MG PO TABS
20.0000 mg | ORAL_TABLET | Freq: Every day | ORAL | Status: DC
  Administered 2023-09-27: 20 mg via ORAL
  Filled 2023-09-27: qty 1

## 2023-09-27 MED ORDER — WARFARIN - PHARMACIST DOSING INPATIENT: Freq: Every day | Status: DC

## 2023-09-27 MED ORDER — ALBUTEROL SULFATE (2.5 MG/3ML) 0.083% IN NEBU: 2.5000 mg | INHALATION_SOLUTION | Freq: Three times a day (TID) | RESPIRATORY_TRACT | Status: DC | PRN

## 2023-09-27 MED ORDER — ORAL CARE MOUTH RINSE: 15.0000 mL | OROMUCOSAL | Status: DC | PRN

## 2023-09-27 MED ORDER — METOPROLOL SUCCINATE ER 25 MG PO TB24
25.0000 mg | ORAL_TABLET | Freq: Every day | ORAL | Status: DC
  Administered 2023-09-27 – 2023-09-28 (×2): 25 mg via ORAL
  Filled 2023-09-27 (×2): qty 1

## 2023-09-27 MED ORDER — HYDROCODONE-ACETAMINOPHEN 5-325 MG PO TABS
1.0000 | ORAL_TABLET | Freq: Four times a day (QID) | ORAL | Status: DC | PRN
  Administered 2023-09-27: 1 via ORAL
  Filled 2023-09-27: qty 1

## 2023-09-27 MED ORDER — METOPROLOL TARTRATE 5 MG/5ML IV SOLN: 5.0000 mg | INTRAVENOUS | Status: DC | PRN

## 2023-09-27 MED ORDER — SPIRONOLACTONE 25 MG PO TABS: 25.0000 mg | ORAL_TABLET | Freq: Every day | ORAL | Status: DC

## 2023-09-27 MED ORDER — HEPARIN BOLUS VIA INFUSION
2500.0000 [IU] | Freq: Once | INTRAVENOUS | Status: AC
  Administered 2023-09-27: 2500 [IU] via INTRAVENOUS
  Filled 2023-09-27: qty 2500

## 2023-09-27 MED ORDER — INSULIN ASPART 100 UNIT/ML IJ SOLN
0.0000 [IU] | Freq: Three times a day (TID) | INTRAMUSCULAR | Status: DC
Start: 2023-09-27 — End: 2023-09-28

## 2023-09-27 MED ORDER — IRBESARTAN 150 MG PO TABS
75.0000 mg | ORAL_TABLET | Freq: Every day | ORAL | Status: DC
  Administered 2023-09-27: 75 mg via ORAL
  Filled 2023-09-27: qty 1

## 2023-09-27 MED ORDER — ASPIRIN 81 MG PO TBEC
81.0000 mg | DELAYED_RELEASE_TABLET | Freq: Every day | ORAL | Status: DC
  Administered 2023-09-27 – 2023-09-28 (×2): 81 mg via ORAL
  Filled 2023-09-27 (×2): qty 1

## 2023-09-27 MED ORDER — MONTELUKAST SODIUM 10 MG PO TABS
10.0000 mg | ORAL_TABLET | Freq: Every day | ORAL | Status: DC
  Administered 2023-09-27: 10 mg via ORAL
  Filled 2023-09-27: qty 1

## 2023-09-27 MED ORDER — ACETAMINOPHEN 325 MG PO TABS: 650.0000 mg | ORAL_TABLET | ORAL | Status: DC | PRN

## 2023-09-27 MED ORDER — ONDANSETRON HCL 4 MG/2ML IJ SOLN: 4.0000 mg | Freq: Four times a day (QID) | INTRAMUSCULAR | Status: DC | PRN

## 2023-09-27 MED ORDER — CYCLOBENZAPRINE HCL 10 MG PO TABS: 10.0000 mg | ORAL_TABLET | Freq: Three times a day (TID) | ORAL | Status: DC | PRN

## 2023-09-27 MED ORDER — WARFARIN SODIUM 7.5 MG PO TABS
7.5000 mg | ORAL_TABLET | Freq: Once | ORAL | Status: AC
  Administered 2023-09-27: 7.5 mg via ORAL
  Filled 2023-09-27: qty 1

## 2023-09-27 MED ORDER — HEPARIN (PORCINE) 25000 UT/250ML-% IV SOLN
1350.0000 [IU]/h | INTRAVENOUS | Status: DC
  Administered 2023-09-27: 1100 [IU]/h via INTRAVENOUS
  Administered 2023-09-28: 1350 [IU]/h via INTRAVENOUS
  Filled 2023-09-27 (×2): qty 250

## 2023-09-27 MED ORDER — PREGABALIN 75 MG PO CAPS
75.0000 mg | ORAL_CAPSULE | Freq: Two times a day (BID) | ORAL | Status: DC
  Administered 2023-09-27 – 2023-09-28 (×3): 75 mg via ORAL
  Filled 2023-09-27 (×3): qty 1

## 2023-09-27 NOTE — Assessment & Plan Note (Signed)
Patient presenting with approximately 36-hour history of atrial flutter with RVR.  Rates have improved somewhat with IV metoprolol, however remained above 100.  - Cardiology consulted; appreciate their recommendations - Plan for TEE with cardioversion tomorrow a.m. - N.p.o. after midnight - Telemetry monitoring - Metoprolol 5 mg IV PRN every 15 minutes for rates above 100 - Consider starting scheduled oral metoprolol - Heparin infusion per pharmacy dosing - Hold home warfarin

## 2023-09-27 NOTE — Assessment & Plan Note (Signed)
Patient's blood pressure is within goal.  - Continue home regimen

## 2023-09-27 NOTE — Care Management Obs Status (Signed)
MEDICARE OBSERVATION STATUS NOTIFICATION   Patient Details  Name: Shelly Huynh MRN: [id number] Date of Birth: 1971-05-12   Medicare Observation Status Notification Given:  Yes    Darolyn Rua, LCSW 09/27/2023, 4:30 PM

## 2023-09-27 NOTE — ED Triage Notes (Addendum)
Pt to ED via POV c/o rapid heart rate that started yesterday. Pt states that her heart was beating so fast that it was moving her upper body. Pt reports that she was getting very short of breathing with minimal exertion yesterday. Pt also reports episodes of sweating. Pt states that she had 1 sharp pain in her chest last night but no other chest pain. Pt reports that she did have back pain but that this is not usual for her.  Pt reports her Ozempic dose was increased 2 weeks ago and that she has also been off her Coumadin to have a tooth pulled. Pt reports when her INR was check last Friday it was low, pt states that she started back on her Coumadin and is supposed to have her level rechecked on Friday.

## 2023-09-27 NOTE — Care Management CC44 (Signed)
Condition Code 44 Documentation Completed  Patient Details  Name: Shelly Huynh MRN: [id number] Date of Birth: 07/17/1971   Condition Code 44 given:  Yes Patient signature on Condition Code 44 notice:  Yes Documentation of 2 MD's agreement:  Yes Code 44 added to claim:  Yes    Darolyn Rua, LCSW 09/27/2023, 4:30 PM

## 2023-09-27 NOTE — Progress Notes (Signed)
PHARMACY - ANTICOAGULATION CONSULT NOTE  Pharmacy Consult for Heparin & Warfarin  Indication: atrial fibrillation  Allergies  Allergen Reactions   Ivp Dye [Iodinated Contrast Media] Anaphylaxis and Hives    Especially CT contrast media; tightness in chest   Sulfa Antibiotics Hives and Anaphylaxis   Banana Itching   Guaifenesin Hives   Hctz [Hydrochlorothiazide] Hives   Keflex [Cephalexin] Other (See Comments)    Abdominal pain Tolerated 1st (CEFAZOLIN) and 2nd (CEFUROXIME) generation cephalosporins on 04/02/2012, 09/07/2017, 11/20/2019, and 07/14/2020 without documented ADRs.   Losartan Itching   Povidone Iodine Hives   Zofran [Ondansetron] Itching   Lisinopril Hives and Rash   Mucinex [Guaifenesin Er] Hives and Rash   Patient Measurements: Height: 5\' 3"  (160 cm) Weight: 110.2 kg (243 lb) IBW/kg (Calculated) : 52.4 Heparin Dosing Weight: 78.9 kg  Vital Signs: Temp: 98.1 F (36.7 C) (11/06 1003) Temp Source: Oral (11/06 1003) BP: 126/81 (11/06 1700) Pulse Rate: 71 (11/06 1700)  Labs: Recent Labs    09/27/23 1007 09/27/23 1207  HGB 15.0  --   HCT 46.9*  --   PLT 292  --   LABPROT 21.6*  --   INR 1.9*  --   CREATININE 0.65  --   TROPONINIHS 13 14   Estimated Creatinine Clearance: 96.9 mL/min (by C-G formula based on SCr of 0.65 mg/dL).  Medical History: Past Medical History:  Diagnosis Date   Anemia    Anxiety    a.) on BZO (diazepam) PRN   Aortic atherosclerosis (HCC)    Aortic valve stenosis and insufficiency, rheumatic    a.) s/p AVR 04/03/2015 - 19 mm St. Jude mechanical valve   Arthritis    Asthma    ATN (acute tubular necrosis) (HCC)    Cardiomegaly    CHF (congestive heart failure) (HCC)    a.) TTE 08/13/2022: EF >55%, mod LAE, mild RV/RAE, triv-mild panvalvular regurgitation, G3DD.   Chronic anticoagulation    Chronic constipation    a.) on linaclotide   Chronic lower back pain    Cirrhosis (HCC)    Coronary artery calcification seen on CT  scan    a.) LHC 12/05/2014: normal coronaries   Depression    DOE (dyspnea on exertion)    Emphysema of lung (HCC)    GERD (gastroesophageal reflux disease)    Helicobacter pylori infection    History of hiatal hernia    History of methicillin resistant staphylococcus aureus (MRSA)    HTN (hypertension)    Hyperaldosteronism (HCC)    Hyperlipidemia    Hypertension    IBS (irritable bowel syndrome)    Long term current use of anticoagulant    a.) warfarin   Lumbar facet joint syndrome    Migraines    Mitral stenosis with insufficiency, rheumatic    a.) s/p MVR 04/03/2015 - 25 mm St. Jude mechanical valve   Moderate tricuspid regurgitation    a.) s/p TVR 04/03/2015 - 21 mm Mosaic porcine valve   NASH (nonalcoholic steatohepatitis)    Obesity    OSA on CPAP    Palpitations    Paroxysmal atrial flutter (HCC)    a.) CHA2DS2VASc = 5 (sex, CHF, HTN, vascular disease history, T2DM);  b.) rate/rhythm maintained without pharmacological intervention; chronically anticoagulated with warfarin   Sciatic pain    T2DM (type 2 diabetes mellitus) (HCC)    Umbilical hernia    a.) s/p repair 09/2004   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HCC)    a.) s/p aortic, mitral, and tricuspid valve replacements  04/03/2015   Vitamin D deficiency    Assessment: Patient is a 52 yo female that presented to ED with atrial flutter with RVR. Patient with a history of a mechanical aortic & mitral valve replacement on warfarin. Her warfarin was recently held for a dental procedure. Baseline INR 1.9. Pharmacy has been consulted for warfarin and heparin management.   Warfarin Home Regimen:  Confirmed with patient as 7.5 mg on Sundays and 6 mg all other days   DDI: aspirin   Goal of Therapy:  Heparin level 0.3 - 0.7 units/ml INR goal 2.5 - 3.5  Monitor platelets by anticoagulation protocol: Yes  Labs: 11/6 1855 HL = 0.16; subtherapeutic    Plan:  Give heparin bolus of 2500 units x 1  Increase heparin infusion to  1350 units/hr  Give warfarin 7.5 mg tonight given subtherapeutic INR  Will plan to bridge warfarin with heparin until INR is therapeutic for at least 24 hours  Check HL 6 hours after rate change  Monitor CBC and INR daily

## 2023-09-27 NOTE — Assessment & Plan Note (Signed)
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  Last echocardiogram available on epic from 2022 with EF above 55%.  Patient on Atacand, spironolactone and metoprolol.

## 2023-09-27 NOTE — ED Provider Notes (Signed)
[facility] Provider Note    Event Date/Time   First MD Initiated Contact with Patient 09/27/23 1052     (approximate)   History   Palpitations   HPI  Shelly Huynh is a 52 y.o. female history of mechanical heart valves on Coumadin presents to the ER for evaluation 24 hours of palpitations intermittent shortness of breath particular with exertion starting yesterday.  She did have to have her Coumadin held for dental procedure last week and was bridged with Lovenox but did not actually take this medication due to fear of needles.  She denies any chest pain right now but will feel ill intermittent palpitations and fluttering sensation in her chest.     Physical Exam   Triage Vital Signs: ED Triage Vitals  Encounter Vitals Group     BP 09/27/23 1003 (!) 120/95     Systolic BP Percentile --      Diastolic BP Percentile --      Pulse Rate 09/27/23 1003 (!) 121     Resp 09/27/23 1003 16     Temp 09/27/23 1003 98.1 F (36.7 C)     Temp Source 09/27/23 1003 Oral     SpO2 09/27/23 1003 97 %     Weight 09/27/23 1005 243 lb (110.2 kg)     Height 09/27/23 1005 5\' 3"  (1.6 m)     Head Circumference --      Peak Flow --      Pain Score 09/27/23 1005 10     Pain Loc --      Pain Education --      Exclude from Growth Chart --     Most recent vital signs: Vitals:   09/27/23 1131 09/27/23 1155  BP: 111/84   Pulse: (!) 114 (!) 56  Resp: 17 (!) 21  Temp:    SpO2: 98% 100%     Constitutional: Alert  Eyes: Conjunctivae are normal.  Head: Atraumatic. Nose: No congestion/rhinnorhea. Mouth/Throat: Mucous membranes are moist.   Neck: Painless ROM.  Cardiovascular:   Good peripheral circulation. Respiratory: Normal respiratory effort.  No retractions.  Gastrointestinal: Soft and nontender.  Musculoskeletal:  no deformity Neurologic:  MAE spontaneously. No gross focal neurologic deficits are appreciated.  Skin:  Skin is warm, dry and intact. No rash  noted. Psychiatric: Mood and affect are normal. Speech and behavior are normal.    ED Results / Procedures / Treatments   Labs (all labs ordered are listed, but only abnormal results are displayed) Labs Reviewed  BASIC METABOLIC PANEL - Abnormal; Notable for the following components:      Result Value   CO2 21 (*)    All other components within normal limits  CBC - Abnormal; Notable for the following components:   HCT 46.9 (*)    RDW 16.1 (*)    All other components within normal limits  PROTIME-INR - Abnormal; Notable for the following components:   Prothrombin Time 21.6 (*)    INR 1.9 (*)    All other components within normal limits  TROPONIN I (HIGH SENSITIVITY)  TROPONIN I (HIGH SENSITIVITY)     EKG  ED ECG REPORT I, Willy Eddy, the attending physician, personally viewed and interpreted this ECG.   Date: 09/27/2023  EKG Time: 9:55  Rate: 120  Rhythm: aflutter  Axis: normal  Intervals: normal qt  ST&T Change: nonspecific st abn    RADIOLOGY Please see ED Course for my review and interpretation.  I personally  reviewed all radiographic images ordered to evaluate for the above acute complaints and reviewed radiology reports and findings.  These findings were personally discussed with the patient.  Please see medical record for radiology report.    PROCEDURES:  Critical Care performed: Yes, see critical care procedure note(s)  .Critical Care  Performed by: Willy Eddy, MD Authorized by: Willy Eddy, MD   Critical care provider statement:    Critical care time (minutes):  34   Critical care was necessary to treat or prevent imminent or life-threatening deterioration of the following conditions:  Cardiac failure   Critical care was time spent personally by me on the following activities:  Ordering and performing treatments and interventions, ordering and review of laboratory studies, ordering and review of radiographic studies, pulse  oximetry, re-evaluation of patient's condition, review of old charts, obtaining history from patient or surrogate, examination of patient, evaluation of patient's response to treatment, discussions with primary provider, discussions with consultants and development of treatment plan with patient or surrogate    MEDICATIONS ORDERED IN ED: Medications  metoprolol tartrate (LOPRESSOR) injection 5 mg (has no administration in time range)  metoprolol tartrate (LOPRESSOR) injection 5 mg (5 mg Intravenous Given 09/27/23 1130)     IMPRESSION / MDM / ASSESSMENT AND PLAN / ED COURSE  I reviewed the triage vital signs and the nursing notes.                              Differential diagnosis includes, but is not limited to, acs, dysrhythmia,Asthma, copd, CHF, pna, ptx, malignancy, Pe, anemia  Patient presenting to the ER for evaluation of symptoms as described above.  Based on symptoms, risk factors and considered above differential, this presenting complaint could reflect a potentially life-threatening illness therefore the patient will be placed on continuous pulse oximetry and telemetry for monitoring.  Laboratory evaluation will be sent to evaluate for the above complaints.        Clinical Course as of 09/27/23 1211  Wed Sep 27, 2023  1130 Chest x-ray my review and interpretation without evidence of consolidation or effusion.  [name] going in and out of rate controlled a flutter frequently reverting to a variably conducted a flutter currently rate is fixed at 118.  Will give IV Lopressor.  I am going to consult with cardiology as she is more complicated patient given her mechanical heart valve history for further recommendations. [PR]  1159 Discussed the case in consultation with cardiology who will plan for TEE cardioversion tomorrow.  Patient will be heparinized as she remains subtherapeutic.  Have consulted hospitalist for admission. [PR]    Clinical Course User Index [PR] Willy Eddy,  MD     FINAL CLINICAL IMPRESSION(S) / ED DIAGNOSES   Final diagnoses:  Atrial flutter with rapid ventricular response (HCC)     Rx / DC Orders   ED Discharge Orders     None        Note:  This document was prepared using Dragon voice recognition software and may include unintentional dictation errors.    Willy Eddy, MD 09/27/23 [phone]

## 2023-09-27 NOTE — Assessment & Plan Note (Signed)
INR 2.2 this morning.  7.5 mg of Coumadin given this morning along with a Lovenox injection.  Can go back on her usual Coumadin dose.  Goal between 2.5 and 3.5.

## 2023-09-27 NOTE — Plan of Care (Signed)
  Problem: Education: Goal: Knowledge of General Education information will improve Description: Including pain rating scale, medication(s)/side effects and non-pharmacologic comfort measures Outcome: Progressing   Problem: Clinical Measurements: Goal: Ability to maintain clinical measurements within normal limits will improve Outcome: Progressing   Problem: Clinical Measurements: Goal: Respiratory complications will improve Outcome: Progressing   Problem: Clinical Measurements: Goal: Cardiovascular complication will be avoided Outcome: Progressing   Problem: Pain Management: Goal: General experience of comfort will improve Outcome: Progressing   Problem: Safety: Goal: Ability to remain free from injury will improve Outcome: Progressing

## 2023-09-27 NOTE — Progress Notes (Signed)
PHARMACY - ANTICOAGULATION CONSULT NOTE  Pharmacy Consult for Heparin Infusion Indication: atrial fibrillation  Allergies  Allergen Reactions   Ivp Dye [Iodinated Contrast Media] Anaphylaxis and Hives    Especially CT contrast media; tightness in chest   Sulfa Antibiotics Hives and Anaphylaxis   Banana Itching   Guaifenesin Hives   Hctz [Hydrochlorothiazide] Hives   Keflex [Cephalexin] Other (See Comments)    Abdominal pain Tolerated 1st (CEFAZOLIN) and 2nd (CEFUROXIME) generation cephalosporins on 04/02/2012, 09/07/2017, 11/20/2019, and 07/14/2020 without documented ADRs.   Losartan Itching   Povidone Iodine Hives   Zofran [Ondansetron] Itching   Lisinopril Hives and Rash   Mucinex [Guaifenesin Er] Hives and Rash    Patient Measurements: Height: 5\' 3"  (160 cm) Weight: 110.2 kg (243 lb) IBW/kg (Calculated) : 52.4 Heparin Dosing Weight: 78.9 kg  Vital Signs: Temp: 98.1 F (36.7 C) (11/06 1003) Temp Source: Oral (11/06 1003) BP: 111/84 (11/06 1131) Pulse Rate: 56 (11/06 1155)  Labs: Recent Labs    09/27/23 1007  HGB 15.0  HCT 46.9*  PLT 292  LABPROT 21.6*  INR 1.9*  CREATININE 0.65  TROPONINIHS 13    Estimated Creatinine Clearance: 96.9 mL/min (by C-G formula based on SCr of 0.65 mg/dL).   Medical History: Past Medical History:  Diagnosis Date   Anemia    Anxiety    a.) on BZO (diazepam) PRN   Aortic atherosclerosis (HCC)    Aortic valve stenosis and insufficiency, rheumatic    a.) s/p AVR 04/03/2015 - 19 mm St. Jude mechanical valve   Arthritis    Asthma    ATN (acute tubular necrosis) (HCC)    Cardiomegaly    CHF (congestive heart failure) (HCC)    a.) TTE 08/13/2022: EF >55%, mod LAE, mild RV/RAE, triv-mild panvalvular regurgitation, G3DD.   Chronic anticoagulation    Chronic constipation    a.) on linaclotide   Chronic lower back pain    Cirrhosis (HCC)    Coronary artery calcification seen on CT scan    a.) LHC 12/05/2014: normal coronaries    Depression    DOE (dyspnea on exertion)    Emphysema of lung (HCC)    GERD (gastroesophageal reflux disease)    Helicobacter pylori infection    History of hiatal hernia    History of methicillin resistant staphylococcus aureus (MRSA)    HTN (hypertension)    Hyperaldosteronism (HCC)    Hyperlipidemia    Hypertension    IBS (irritable bowel syndrome)    Long term current use of anticoagulant    a.) warfarin   Lumbar facet joint syndrome    Migraines    Mitral stenosis with insufficiency, rheumatic    a.) s/p MVR 04/03/2015 - 25 mm St. Jude mechanical valve   Moderate tricuspid regurgitation    a.) s/p TVR 04/03/2015 - 21 mm Mosaic porcine valve   NASH (nonalcoholic steatohepatitis)    Obesity    OSA on CPAP    Palpitations    Paroxysmal atrial flutter (HCC)    a.) CHA2DS2VASc = 5 (sex, CHF, HTN, vascular disease history, T2DM);  b.) rate/rhythm maintained without pharmacological intervention; chronically anticoagulated with warfarin   Sciatic pain    T2DM (type 2 diabetes mellitus) (HCC)    Umbilical hernia    a.) s/p repair 09/2004   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HCC)    a.) s/p aortic, mitral, and tricuspid valve replacements 04/03/2015   Vitamin D deficiency     Assessment: Patient is a 52yo female that presented to ED  with atrial flutter with RVR. Patient with a history of a mechanical heart valve and takes warfarin for anticoagulation. Her warfarin was recently held for a dental procedure.  Baseline INR 1.9  Goal of Therapy:  Heparin level 0.3-0.7 units/ml Monitor platelets by anticoagulation protocol: Yes   Plan:  Will initiate Heparin infusion at 1100 units/hr, no initial bolus since INR is close to therapeutic. Check HL in 6 hours Daily CBC while on Heparin infusion.  Clovia Cuff, PharmD, BCPS 09/27/2023 12:21 PM

## 2023-09-27 NOTE — Consult Note (Signed)
[facility] CLINIC CARDIOLOGY CONSULT NOTE       Patient ID: Shelly Huynh MRN: [id number] DOB/AGE: 04-01-1971 52 y.o.  Admit date: 09/27/2023 Referring Physician Dr. Willy Eddy Primary Physician Barbette Reichmann, MD  Primary Cardiologist Dr. Clotilde Dieter Reason for Consultation atrial flutter  HPI: Shelly Huynh is a 52 y.o. female  with a past medical history of paroxysmal atrial flutter, hypertension, history of mechanical aortic valve placement, history of mechanical mitral valve replacement, history of bioprosthetic tricuspid valve replacement, anticoagulated on warfarin, CAD seen by CT, OSA, palpitations, hypercholesterolemia, type 2 diabetes who presented to the ED on 09/27/2023 for rapid heart rate, palpitations. Cardiology was consulted for further evaluation.    Patient reports that she was in her usual state of health until yesterday morning.  Reports that she was up early working at [employer] and had a cup of coffee, reports that she usually drinks decaf.  She then had a second cup of coffee roughly an hour later and about 30 minutes after this she began experiencing palpitations.  States that she felt like her heart was racing and also had associated lightheadedness when she would get up and walk around.  Endorses shortness of breath associated with the palpitations.  This was worse when she would exert herself.  States that the symptoms persisted throughout the night, she went to sleep around 12 AM.  When she woke up this morning she still felt the same and decided to come to the ED for further evaluation.  Workup in the ED notable for Cr 0.65, K 3.6, Hgb 15.0, INR 1.9. Troponins 13 > 14. EKG with atrial flutter rate 120 bpm. CXR with bilateral interstitial opacities. Patient with given IV metoprolol in the ED with improvement in HR.   Patient resting comfortably in ED stretcher at the time of my evaluation this afternoon.  She states that overall she feels some better  now that her heart rate has improved, remains in atrial flutter on telemetry with variable block and rates ranging from 60s to 120s.  She denies any similar episodes recently.  States that she does have a history of atrial flutter but has not had an episode in many years.  She has a history of mechanical aortic and mitral valve replacement as well as bioprosthetic tricuspid valve replacement, she takes warfarin at home for anticoagulation.  She had recently been holding this due to having a dental procedure done but restarted late last week.  INR subtherapeutic here in the ED.  Otherwise she denies any chest pain, headaches, visual changes.  Review of systems complete and found to be negative unless listed above    Past Medical History:  Diagnosis Date   Anemia    Anxiety    a.) on BZO (diazepam) PRN   Aortic atherosclerosis (HCC)    Aortic valve stenosis and insufficiency, rheumatic    a.) s/p AVR 04/03/2015 - 19 mm St. Jude mechanical valve   Arthritis    Asthma    ATN (acute tubular necrosis) (HCC)    Cardiomegaly    CHF (congestive heart failure) (HCC)    a.) TTE 08/13/2022: EF >55%, mod LAE, mild RV/RAE, triv-mild panvalvular regurgitation, G3DD.   Chronic anticoagulation    Chronic constipation    a.) on linaclotide   Chronic lower back pain    Cirrhosis (HCC)    Coronary artery calcification seen on CT scan    a.) LHC 12/05/2014: normal coronaries   Depression    DOE (dyspnea  on exertion)    Emphysema of lung (HCC)    GERD (gastroesophageal reflux disease)    Helicobacter pylori infection    History of hiatal hernia    History of methicillin resistant staphylococcus aureus (MRSA)    HTN (hypertension)    Hyperaldosteronism (HCC)    Hyperlipidemia    Hypertension    IBS (irritable bowel syndrome)    Long term current use of anticoagulant    a.) warfarin   Lumbar facet joint syndrome    Migraines    Mitral stenosis with insufficiency, rheumatic    a.) s/p MVR  04/03/2015 - 25 mm St. Jude mechanical valve   Moderate tricuspid regurgitation    a.) s/p TVR 04/03/2015 - 21 mm Mosaic porcine valve   NASH (nonalcoholic steatohepatitis)    Obesity    OSA on CPAP    Palpitations    Paroxysmal atrial flutter (HCC)    a.) CHA2DS2VASc = 5 (sex, CHF, HTN, vascular disease history, T2DM);  b.) rate/rhythm maintained without pharmacological intervention; chronically anticoagulated with warfarin   Sciatic pain    T2DM (type 2 diabetes mellitus) (HCC)    Umbilical hernia    a.) s/p repair 09/2004   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HCC)    a.) s/p aortic, mitral, and tricuspid valve replacements 04/03/2015   Vitamin D deficiency     Past Surgical History:  Procedure Laterality Date   ABDOMINAL HYSTERECTOMY  2010   AORTIC VALVE REPLACEMENT  04/03/2015   Procedure: AORTIC VALVE REPLACEMENT (19 mm St. Jude Regent mechanical valve); Location: [facility]; Surgeon: Chapman Fitch, MD   ARTERY BIOPSY N/A 10/03/2022   Procedure: BIOPSY TEMPORAL ARTERY;  Surgeon: Carolan Shiver, MD;  Location: [facility];  Service: General;  Laterality: N/A;   BREAST BIOPSY Left 07/25/2017   BENIGN BREAST TISSUE WITH MICROCALCIFICATIONS   BREAST BIOPSY Right 10/22/2019   Affirm Bx- X-clip- CSL, no atypia   BREAST BIOPSY Right 10/22/2019   Affirm Bx- Ribbon clip- CSL, no atypia   BREAST BIOPSY Right 11/20/2019   Procedure: BREAST BIOPSY WITH NEEDLE LOCALIZATION x 2;  Surgeon: Carolan Shiver, MD;  Location: [facility];  Service: General;  Laterality: Right;   BREAST EXCISIONAL BIOPSY Right 1992   NEG   BREAST LUMPECTOMY Right 11/20/2019   complex sclerosing lesion x 2   CESAREAN SECTION N/A 2000   CESAREAN SECTION N/A 1987   CHOLECYSTECTOMY  07/2020   COLONOSCOPY WITH PROPOFOL N/A 09/21/2020   Procedure: COLONOSCOPY WITH PROPOFOL;  Surgeon: Toledo, Boykin Nearing, MD;  Location: [facility];  Service: Gastroenterology;  Laterality: N/A;   ESOPHAGOGASTRODUODENOSCOPY (EGD) WITH  PROPOFOL N/A 10/07/2016   Procedure: ESOPHAGOGASTRODUODENOSCOPY (EGD) WITH PROPOFOL;  Surgeon: Wyline Mood, MD;  Location: [facility];  Service: Endoscopy;  Laterality: N/A;   ESOPHAGOGASTRODUODENOSCOPY (EGD) WITH PROPOFOL N/A 09/21/2020   Procedure: ESOPHAGOGASTRODUODENOSCOPY (EGD) WITH PROPOFOL;  Surgeon: Toledo, Boykin Nearing, MD;  Location: [facility];  Service: Gastroenterology;  Laterality: N/A;   LAPAROSCOPIC GASTRIC BAND REMOVAL WITH LAPAROSCOPIC GASTRIC SLEEVE RESECTION     LEFT HEART CATH AND CORONARY ANGIOGRAPHY Left 12/05/2014   Procedure: LEFT HEART CATH AND CORONARY ANGIOGRAPHY; Location: [facility]; Surgeon: Arnoldo Hooker, MD   LIPOSUCTION TRUNK  1996   MITRAL VALVE REPLACEMENT  04/03/2015   Procedure: MITRAL VALVE REPLACEMENT (25 mm St. Jude Mechanical valve); Location: [facility]; Surgeon: Chapman Fitch, MD   TRICUSPID VALVE REPLACEMENT  04/03/2015   Procedure: TRICUSPID VALVE REPLACEMENT (21 mm Mosaic porcine valve); Location: [facility]; Surgeon: Chapman Fitch, MD   TUBAL LIGATION  2000  UMBILICAL HERNIA REPAIR  2005   VENTRAL HERNIA REPAIR      (Not in a hospital admission)  Social History   Socioeconomic History   Marital status: Married    Spouse name: Terrance   Number of children: 2   Years of education: Not on file   Highest education level: Not on file  Occupational History   Not on file  Tobacco Use   Smoking status: Every Day    Current packs/day: 0.50    Average packs/day: 0.5 packs/day for 25.0 years (12.5 ttl pk-yrs)    Types: Cigarettes   Smokeless tobacco: Never  Vaping Use   Vaping status: Never Used  Substance and Sexual Activity   Alcohol use: No   Drug use: No   Sexual activity: Not on file  Other Topics Concern   Not on file  Social History Narrative   Not on file   Social Determinants of Health   Financial Resource Strain: Medium Risk (10/02/2017)   Overall Financial Resource Strain (CARDIA)    Difficulty of Paying Living Expenses: Somewhat  hard  Food Insecurity: Food Insecurity Present (10/02/2017)   Hunger Vital Sign    Worried About Running Out of Food in the Last Year: Often true    Ran Out of Food in the Last Year: Often true  Transportation Needs: No Transportation Needs (10/02/2017)   PRAPARE - Administrator, Civil Service (Medical): No    Lack of Transportation (Non-Medical): No  Physical Activity: Unknown (10/02/2017)   Exercise Vital Sign    Days of Exercise per Week: 0 days    Minutes of Exercise per Session: Not on file  Stress: Stress Concern Present (10/02/2017)   [employer] of Occupational Health - Occupational Stress Questionnaire    Feeling of Stress : Very much  Social Connections: Socially Integrated (10/02/2017)   Social Connection and Isolation Panel [NHANES]    Frequency of Communication with Friends and Family: More than three times a week    Frequency of Social Gatherings with Friends and Family: Once a week    Attends Religious Services: More than 4 times per year    Active Member of [employer] or Organizations: Yes    Attends Banker Meetings: More than 4 times per year    Marital Status: Married  Catering manager Violence: Not At Risk (10/02/2017)   Humiliation, Afraid, Rape, and Kick questionnaire    Fear of Current or Ex-Partner: No    Emotionally Abused: No    Physically Abused: No    Sexually Abused: No    Family History  Problem Relation Age of Onset   Thyroid cancer Mother    Uterine cancer Mother    Breast cancer Paternal Grandmother    Lung cancer Paternal Grandmother    Pancreatic cancer Cousin      Vitals:   09/27/23 1155 09/27/23 1215 09/27/23 1230 09/27/23 1300  BP:  (!) 114/90 120/86 125/81  Pulse: (!) 56 83 (!) 117 (!) 116  Resp: (!) 21 18 (!) 22 (!) 21  Temp:      TempSrc:      SpO2: 100% 94% 95% 100%  Weight:      Height:        PHYSICAL EXAM General: Well-appearing, well nourished, in no acute distress. HEENT: Normocephalic and  atraumatic. Neck: No JVD.  Lungs: Normal respiratory effort on room air. Clear bilaterally to auscultation. No wheezes, crackles, rhonchi.  Heart: Irregularly irregular, controlled rate. Normal S1 and  S2 without gallops. + murmur. Abdomen: Non-distended appearing.  Msk: Normal strength and tone for age. Extremities: Warm and well perfused. No clubbing, cyanosis.  No edema.  Neuro: Alert and oriented X 3. Psych: Answers questions appropriately.   Labs: Basic Metabolic Panel: Recent Labs    09/27/23 1007  NA 136  K 3.6  CL 106  CO2 21*  GLUCOSE 77  BUN 8  CREATININE 0.65  CALCIUM 9.3  MG 2.1   Liver Function Tests: No results for input(s): "AST", "ALT", "ALKPHOS", "BILITOT", "PROT", "ALBUMIN" in the last 72 hours. No results for input(s): "LIPASE", "AMYLASE" in the last 72 hours. CBC: Recent Labs    09/27/23 1007  WBC 5.2  HGB 15.0  HCT 46.9*  MCV 91.8  PLT 292   Cardiac Enzymes: Recent Labs    09/27/23 1007 09/27/23 1207  TROPONINIHS 13 14   BNP: No results for input(s): "BNP" in the last 72 hours. D-Dimer: No results for input(s): "DDIMER" in the last 72 hours. Hemoglobin A1C: No results for input(s): "HGBA1C" in the last 72 hours. Fasting Lipid Panel: No results for input(s): "CHOL", "HDL", "LDLCALC", "TRIG", "CHOLHDL", "LDLDIRECT" in the last 72 hours. Thyroid Function Tests: No results for input(s): "TSH", "T4TOTAL", "T3FREE", "THYROIDAB" in the last 72 hours.  Invalid input(s): "FREET3" Anemia Panel: No results for input(s): "VITAMINB12", "FOLATE", "FERRITIN", "TIBC", "IRON", "RETICCTPCT" in the last 72 hours.   Radiology: DG Chest 2 View  Result Date: 09/27/2023 CLINICAL DATA:  Palpitations EXAM: CHEST - 2 VIEW COMPARISON:  Chest radiograph dated 04/09/2021 FINDINGS: Normal lung volumes. Mild bilateral interstitial opacities. No pleural effusion or pneumothorax. Similar enlarged, postsurgical cardiomediastinal silhouette. Median sternotomy wires are  nondisplaced. IMPRESSION: 1. Mild bilateral interstitial opacities, which may represent pulmonary edema. 2. Similar cardiomegaly. Electronically Signed   By: Agustin Cree M.D.   On: 09/27/2023 11:28    ECHO 2022: INTERPRETATION  NORMAL LEFT VENTRICULAR SYSTOLIC FUNCTION  NORMAL RIGHT VENTRICULAR SYSTOLIC FUNCTION  MILD VALVULAR REGURGITATION (See above)  NO VALVULAR STENOSIS  TRICUSPID VALVE PROSTHESIS APPEARS WELL-SEATED WITH NORMAL FUNCTION  MITRAL VALVE PROSTHESIS APPEARS WELL-SEATED WITH NORMAL FUNCTION  AORTIC VALVE PROSTHESIS APPEARS WELL-SEATED WITH NORMAL FUNCTION   TELEMETRY reviewed by me 09/27/2023: atrial flutter with variable block, HR ranging between upper 50s-120s  EKG reviewed by me: atrial flutter rate 120 bpm  Data reviewed by me 09/27/2023: last 24h vitals tele labs imaging I/O ED provider note, admission H&P  Principal Problem:   Atrial flutter with rapid ventricular response (HCC) Active Problems:   Benign essential HTN   Chronic combined systolic (congestive) and diastolic (congestive) heart failure (HCC)   Obstructive apnea   H/O aortic valve replacement   Diabetes mellitus (HCC)   Cirrhosis of liver without ascites (HCC)    ASSESSMENT AND PLAN:  Shelly Huynh is a 52 y.o. female  with a past medical history of paroxysmal atrial flutter, hypertension, history of mechanical aortic valve placement, history of mechanical mitral valve replacement, history of bioprosthetic tricuspid valve replacement, anticoagulated on warfarin, CAD seen by CT, OSA, palpitations, hypercholesterolemia, type 2 diabetes who presented to the ED on 09/27/2023 for rapid heart rate, palpitations. Cardiology was consulted for further evaluation.   # Atrial flutter with variable block Patient with a hx of paroxysmal atrial flutter presenting with 1 day of palpitations and heart racing. Found to be in atrial flutter on EKG in the ED. Telemetry shows flutter with variable block. Troponins 13 >  14. No electrolyte derangement on labs. Had  been holding warfarin recently for dental procedure but restarted late last week.  INR subtherapeutic today at 1.9.  Patient converted to sinus bradycardia in the upper 50s during my evaluation. -Will start p.o. metoprolol succinate 25 mg daily -Heparin infusion for anticoagulation.  -Monitor for recurrence of atrial flutter, can consider TEE/CV if she has recurrence. -Will plan to set patient up for outpatient EP evaluation.  # Hx mechanical aortic and mitral valve replacement # Hx bioprosthetic tricuspid valve replacement Last echo 2022 with well-seated and appropriately functioning valves. On warfarin at home with INR goal 2.5-3.5.  -Plan to resume warfarin prior to discharge.   This patient's plan of care was discussed and created with Dr. Melton Alar and she is in agreement.  Signed: Gale Journey, PA-C  09/27/2023, 1:45 PM [facility] Cardiology

## 2023-09-27 NOTE — Assessment & Plan Note (Signed)
History of well-controlled type 2 diabetes with last A1c of 6.6%. Can resume oral medications as outpatient.

## 2023-09-27 NOTE — H&P (Signed)
History and Physical    Patient: Shelly Huynh WUJ:[id number] DOB: [birth year] DOA: 09/27/2023 DOS: the patient was seen and examined on 09/27/2023 PCP: Barbette Reichmann, MD  Patient coming from: Home  Chief Complaint:  Chief Complaint  Patient presents with   Palpitations   HPI: Shelly Huynh is a 52 y.o. female with medical history significant of paroxysmal atrial fibrillation on Coumadin, mechanical aortic valve, bioprosthetic tricuspid valve, CAD, OSA, type 2 diabetes, cirrhosis secondary to NASH, HFpEF, hypertension, hyperlipidemia, who presents to the ED due to palpitations.  Shelly Huynh states she was in her usual state of health yesterday morning when she had sudden onset palpitations with feeling as though her heart was pounding out of her chest in addition to sweating, shortness of breath and dizziness.  She states that symptoms would come and go but her heart rate would remain very high.  She had a one-time episode of electric like pain in the left side of her chest that quickly resolved without recurrence.  She denies any orthopnea or lower extremity swelling.  She denies any changes in her diet, but notes she did have a caffeinated coffee yesterday, where she normally only drinks decaf.  [name] compliant with her CPAP at bedtime.  She has not been on Coumadin since having a dental procedure last week.  ED course: On arrival to the ED, patient was normotensive at 120/95 with heart rate of 121.  She was saturating at 97% on room air.  She was afebrile at 98.1. Initial workup notable for normal CBC, and BNP.  Troponin negative.  INR 1.9.  Chest x-ray with mild bibasilar interstitial opacities consistent with mild edema.  Patient received IV metoprolol and started on heparin infusion.  Cardiology was consulted with plans for cardioversion with TEE tomorrow.  TRH contacted for admission.  Review of Systems: As mentioned in the history of present illness. All other systems reviewed  and are negative.  Past Medical History:  Diagnosis Date   Anemia    Anxiety    a.) on BZO (diazepam) PRN   Aortic atherosclerosis (HCC)    Aortic valve stenosis and insufficiency, rheumatic    a.) s/p AVR 04/03/2015 - 19 mm St. Jude mechanical valve   Arthritis    Asthma    ATN (acute tubular necrosis) (HCC)    Cardiomegaly    CHF (congestive heart failure) (HCC)    a.) TTE 08/13/2022: EF >55%, mod LAE, mild RV/RAE, triv-mild panvalvular regurgitation, G3DD.   Chronic anticoagulation    Chronic constipation    a.) on linaclotide   Chronic lower back pain    Cirrhosis (HCC)    Coronary artery calcification seen on CT scan    a.) LHC 12/05/2014: normal coronaries   Depression    DOE (dyspnea on exertion)    Emphysema of lung (HCC)    GERD (gastroesophageal reflux disease)    Helicobacter pylori infection    History of hiatal hernia    History of methicillin resistant staphylococcus aureus (MRSA)    HTN (hypertension)    Hyperaldosteronism (HCC)    Hyperlipidemia    Hypertension    IBS (irritable bowel syndrome)    Long term current use of anticoagulant    a.) warfarin   Lumbar facet joint syndrome    Migraines    Mitral stenosis with insufficiency, rheumatic    a.) s/p MVR 04/03/2015 - 25 mm St. Jude mechanical valve   Moderate tricuspid regurgitation    a.) s/p TVR 04/03/2015 -  21 mm Mosaic porcine valve   NASH (nonalcoholic steatohepatitis)    Obesity    OSA on CPAP    Palpitations    Paroxysmal atrial flutter (HCC)    a.) CHA2DS2VASc = 5 (sex, CHF, HTN, vascular disease history, T2DM);  b.) rate/rhythm maintained without pharmacological intervention; chronically anticoagulated with warfarin   Sciatic pain    T2DM (type 2 diabetes mellitus) (HCC)    Umbilical hernia    a.) s/p repair 09/2004   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HCC)    a.) s/p aortic, mitral, and tricuspid valve replacements 04/03/2015   Vitamin D deficiency    Past Surgical History:  Procedure  Laterality Date   ABDOMINAL HYSTERECTOMY  2010   AORTIC VALVE REPLACEMENT  04/03/2015   Procedure: AORTIC VALVE REPLACEMENT (19 mm St. Jude Regent mechanical valve); Location: [facility]; Surgeon: Chapman Fitch, MD   ARTERY BIOPSY N/A 10/03/2022   Procedure: BIOPSY TEMPORAL ARTERY;  Surgeon: Carolan Shiver, MD;  Location: [facility];  Service: General;  Laterality: N/A;   BREAST BIOPSY Left 07/25/2017   BENIGN BREAST TISSUE WITH MICROCALCIFICATIONS   BREAST BIOPSY Right 10/22/2019   Affirm Bx- X-clip- CSL, no atypia   BREAST BIOPSY Right 10/22/2019   Affirm Bx- Ribbon clip- CSL, no atypia   BREAST BIOPSY Right 11/20/2019   Procedure: BREAST BIOPSY WITH NEEDLE LOCALIZATION x 2;  Surgeon: Carolan Shiver, MD;  Location: [facility];  Service: General;  Laterality: Right;   BREAST EXCISIONAL BIOPSY Right 1992   NEG   BREAST LUMPECTOMY Right 11/20/2019   complex sclerosing lesion x 2   CESAREAN SECTION N/A 2000   CESAREAN SECTION N/A 1987   CHOLECYSTECTOMY  07/2020   COLONOSCOPY WITH PROPOFOL N/A 09/21/2020   Procedure: COLONOSCOPY WITH PROPOFOL;  Surgeon: Toledo, Boykin Nearing, MD;  Location: [facility];  Service: Gastroenterology;  Laterality: N/A;   ESOPHAGOGASTRODUODENOSCOPY (EGD) WITH PROPOFOL N/A 10/07/2016   Procedure: ESOPHAGOGASTRODUODENOSCOPY (EGD) WITH PROPOFOL;  Surgeon: Wyline Mood, MD;  Location: [facility];  Service: Endoscopy;  Laterality: N/A;   ESOPHAGOGASTRODUODENOSCOPY (EGD) WITH PROPOFOL N/A 09/21/2020   Procedure: ESOPHAGOGASTRODUODENOSCOPY (EGD) WITH PROPOFOL;  Surgeon: Toledo, Boykin Nearing, MD;  Location: [facility];  Service: Gastroenterology;  Laterality: N/A;   LAPAROSCOPIC GASTRIC BAND REMOVAL WITH LAPAROSCOPIC GASTRIC SLEEVE RESECTION     LEFT HEART CATH AND CORONARY ANGIOGRAPHY Left 12/05/2014   Procedure: LEFT HEART CATH AND CORONARY ANGIOGRAPHY; Location: [facility]; Surgeon: Arnoldo Hooker, MD   LIPOSUCTION TRUNK  1996   MITRAL VALVE REPLACEMENT  04/03/2015    Procedure: MITRAL VALVE REPLACEMENT (25 mm St. Jude Mechanical valve); Location: [facility]; Surgeon: Chapman Fitch, MD   TRICUSPID VALVE REPLACEMENT  04/03/2015   Procedure: TRICUSPID VALVE REPLACEMENT (21 mm Mosaic porcine valve); Location: [facility]; Surgeon: Chapman Fitch, MD   TUBAL LIGATION  2000   UMBILICAL HERNIA REPAIR  2005   VENTRAL HERNIA REPAIR     Social History:  reports that she has been smoking cigarettes. She has a 12.5 pack-year smoking history. She has never used smokeless tobacco. She reports that she does not drink alcohol and does not use drugs.  Allergies  Allergen Reactions   Ivp Dye [Iodinated Contrast Media] Anaphylaxis and Hives    Especially CT contrast media; tightness in chest   Sulfa Antibiotics Hives and Anaphylaxis   Banana Itching   Guaifenesin Hives   Hctz [Hydrochlorothiazide] Hives   Keflex [Cephalexin] Other (See Comments)    Abdominal pain Tolerated 1st (CEFAZOLIN) and 2nd (CEFUROXIME) generation cephalosporins on 04/02/2012, 09/07/2017, 11/20/2019, and 07/14/2020 without  documented ADRs.   Losartan Itching   Povidone Iodine Hives   Zofran [Ondansetron] Itching   Lisinopril Hives and Rash   Mucinex [Guaifenesin Er] Hives and Rash    Family History  Problem Relation Age of Onset   Thyroid cancer Mother    Uterine cancer Mother    Breast cancer Paternal Grandmother    Lung cancer Paternal Grandmother    Pancreatic cancer Cousin     Prior to Admission medications   Medication Sig Start Date End Date Taking? Authorizing Provider  warfarin (COUMADIN) 7.5 MG tablet Take 1 tablet (7.5 mg total) one time only at 6 PM by mouth. Patient taking differently: Take 6 mg by mouth daily. Coumadin dosage according to lab results 10/04/17  Yes Mody, Patricia Pesa, MD  albuterol (VENTOLIN HFA) 108 (90 Base) MCG/ACT inhaler Inhale 2 puffs into the lungs every 6 (six) hours as needed for wheezing or shortness of breath.    [provider]  amoxicillin (AMOXIL) 500  MG capsule Take 2,000 mg by mouth once. Prior to dental or surgery    [provider]  aspirin EC 81 MG tablet Take 81 mg daily by mouth.    [provider]  BLACK CURRANT SEED OIL PO Take by mouth.    [provider]  candesartan (ATACAND) 8 MG tablet Take 8 mg by mouth at bedtime.  12/07/19   [provider]  Cranberry-Vitamin C (CRANBERRY CONCENTRATE/VITAMINC PO) Take 2 capsules by mouth daily.    [provider]  cyclobenzaprine (FLEXERIL) 10 MG tablet Take 1 tablet by mouth 3 (three) times daily as needed. 04/10/19   [provider]  diazepam (VALIUM) 5 MG tablet Take 5 mg by mouth every 12 (twelve) hours as needed for anxiety or muscle spasms.    [provider]  enoxaparin (LOVENOX) 120 MG/0.8ML injection Inject 120 mg into the skin 2 (two) times daily. Coumadin/lovenox bridge for temporal artery biopsy surgery. Following calendar from Dr. Gwen Pounds    [provider]  fluticasone [facility]) 50 MCG/ACT nasal spray Place 2 sprays into both nostrils daily as needed for allergies or rhinitis.    [provider]  gabapentin (NEURONTIN) 300 MG capsule Take by mouth 2 (two) times daily. Patient not taking: Reported on 12/21/2022 01/05/22   [provider]  Galcanezumab-gnlm [facility]) Inject into the skin. Patient not taking: Reported on 09/29/2022    [provider]  Galcanezumab-gnlm 120 MG/ML SOSY Inject 120 mg into the skin every 30 (thirty) days.  Patient not taking: Reported on 07/05/2022    [provider]  HYDROcodone-acetaminophen (NORCO/VICODIN) 5-325 MG tablet Take 1 tablet by mouth every 6 (six) hours as needed for moderate pain.    [provider]  hydrocortisone 2.5 % cream Apply 1 application  topically 2 (two) times daily as needed.    [provider]  ipratropium-albuterol (DUONEB) 0.5-2.5 (3) MG/3ML SOLN Take 3 mLs by nebulization every 6 (six) hours as needed.     [provider]  iron polysaccharides (NIFEREX) 150 MG capsule Take 150 mg by mouth daily.    [provider]  levalbuterol (XOPENEX) 1.25 MG/0.5ML nebulizer solution Take 1.25 mg by nebulization every 8 (eight) hours as needed for wheezing or shortness of breath.    [provider]  levocetirizine (XYZAL) 5 MG tablet Take 5 mg by mouth every evening.  05/10/17   [provider]  linaclotide (LINZESS) 72 MCG capsule Take 72 mcg by mouth daily as needed.  [provider]  meclizine (ANTIVERT) 25 MG tablet Take 25 mg by mouth every 6 (six) hours as needed for dizziness. 05/11/15   [provider]  memantine (NAMENDA) 5 MG tablet Take 5 mg by mouth 2 (two) times daily.     [provider]  methylPREDNISolone (MEDROL DOSEPAK) 4 MG TBPK tablet Take as directed Patient not taking: Reported on 12/21/2022 10/07/22   Irean Hong, MD  montelukast (SINGULAIR) 10 MG tablet Take 10 mg by mouth at bedtime.    [provider]  nystatin (MYCOSTATIN/NYSTOP) powder Apply 1 Application topically in the morning and at bedtime.    [provider]  oxyCODONE-acetaminophen (PERCOCET/ROXICET) 5-325 MG tablet Take 1 tablet by mouth every 4 (four) hours as needed for severe pain. 10/07/22   Irean Hong, MD  OZEMPIC, 0.25 OR 0.5 MG/DOSE, 2 MG/3ML SOPN Inject 0.5 mg into the skin once a week. sunday 04/04/22   [provider]  pravastatin (PRAVACHOL) 20 MG tablet Take 20 mg by mouth at bedtime.  12/07/14   [provider]  pregabalin (LYRICA) 75 MG capsule Take 75 mg by mouth 2 (two) times daily.    [provider]  spironolactone (ALDACTONE) 25 MG tablet Take 25 mg by mouth daily.     [provider]  spironolactone (ALDACTONE) 50 MG tablet Take 50 mg by mouth daily.    [provider]  Vitamin D, Ergocalciferol, (DRISDOL) 1.25 MG (50000 UNIT) CAPS capsule Take 50,000 Units by mouth once a week.  sunday 06/10/22   [provider]    Physical Exam: Vitals:   09/27/23 1131 09/27/23 1155 09/27/23 1215 09/27/23 1230  BP: 111/84  (!) 114/90 120/86  Pulse: (!) 114 (!) 56 83 (!) 117  Resp: 17 (!) 21 18 (!) 22  Temp:      TempSrc:      SpO2: 98% 100% 94% 95%  Weight:      Height:       Physical Exam Vitals and nursing note reviewed.  Constitutional:      General: [name] not in acute distress.    Appearance: [name] obese.  HENT:     Head: Normocephalic and atraumatic.     Mouth/Throat:     Mouth: Mucous membranes are moist.     Pharynx: Oropharynx is clear.  Eyes:     Conjunctiva/sclera: Conjunctivae normal.     Pupils: Pupils are equal, round, and reactive to light.  Cardiovascular:     Rate and Rhythm: Tachycardia present. Rhythm irregular.     Heart sounds: Murmur heard.  Pulmonary:     Effort: Pulmonary effort is normal. No respiratory distress.     Breath sounds: Normal breath sounds. No wheezing, rhonchi or rales.  Abdominal:     General: Bowel sounds are normal. There is no distension.     Palpations: Abdomen is soft.     Tenderness: There is no abdominal tenderness. There is no guarding.  Musculoskeletal:     Right lower leg: No edema.     Left lower leg: No edema.  Skin:    General: Skin is warm and dry.  Neurological:     Mental Status: [name] alert and oriented to person, place, and time. Mental status is at baseline.  Psychiatric:        Mood and Affect: Mood normal.        Behavior: Behavior normal.    Data Reviewed: CBC with WBC of 5.2, hemoglobin of  15.0, MCV of 91.8 and platelets of 292 BMP with sodium of 136, potassium 3.6, bicarb 21, glucose 77, BUN 8, creatinine 0.65 GFR above 60 Troponin 13 INR 1.9  EKG personally reviewed.  Regular rhythm with rate of 120.  Either atrial fibrillation with 2-1 block versus sinus tachycardia.  DG Chest 2 View  Result Date: 09/27/2023 CLINICAL DATA:  Palpitations EXAM: CHEST - 2 VIEW COMPARISON:   Chest radiograph dated 04/09/2021 FINDINGS: Normal lung volumes. Mild bilateral interstitial opacities. No pleural effusion or pneumothorax. Similar enlarged, postsurgical cardiomediastinal silhouette. Median sternotomy wires are nondisplaced. IMPRESSION: 1. Mild bilateral interstitial opacities, which may represent pulmonary edema. 2. Similar cardiomegaly. Electronically Signed   By: Agustin Cree M.D.   On: 09/27/2023 11:28    Results are pending, will review when available.  Assessment and Plan:  * Atrial flutter with rapid ventricular response [facility]) Patient presenting with approximately 36-hour history of atrial flutter with RVR.  Rates have improved somewhat with IV metoprolol, however remained above 100.  - Cardiology consulted; appreciate their recommendations - Plan for TEE with cardioversion tomorrow a.m. - N.p.o. after midnight - Telemetry monitoring - Metoprolol 5 mg IV PRN every 15 minutes for rates above 100 - Consider starting scheduled oral metoprolol - Heparin infusion per pharmacy dosing - Hold home warfarin  Chronic combined systolic (congestive) and diastolic (congestive) heart failure (HCC) Per chart review, patient has a history of valvular cardiomyopathy complicated by combined systolic and diastolic heart failure.  Last echocardiogram available on epic from 2022 with EF above 55% with grade 3 restrictive pattern.  Patient appears euvolemic at this time with no evidence of peripheral or pulmonary edema on examination.  - Continue home GDMT - Daily weights - Strict in and out given high risk of acute exacerbation  H/O aortic valve replacement - Hold home warfarin - Heparin per pharmacy dosing  Cirrhosis of liver without ascites (HCC) History of compensated cirrhosis secondary to NASH.  No evidence of complications at this time.  Diabetes mellitus (HCC) History of well-controlled type 2 diabetes with last A1c of 6.6%.  - Hold home antiglycemic agents - SSI,  moderate  Benign essential HTN Patient's blood pressure is within goal.  - Continue home regimen  Advance Care Planning:   Code Status: Full Code   Consults: Cardiology  Family Communication: Patients husband updated at bedside  Severity of Illness: The appropriate patient status for this patient is INPATIENT. Inpatient status is judged to be reasonable and necessary in order to provide the required intensity of service to ensure the patient's safety. The patient's presenting symptoms, physical exam findings, and initial radiographic and laboratory data in the context of their chronic comorbidities is felt to place them at high risk for further clinical deterioration. Furthermore, it is not anticipated that the patient will be medically stable for discharge from the hospital within 2 midnights of admission.   * I certify that at the point of admission it is my clinical judgment that the patient will require inpatient hospital care spanning beyond 2 midnights from the point of admission due to high intensity of service, high risk for further deterioration and high frequency of surveillance required.*  Author: Verdene Lennert, MD 09/27/2023 1:10 PM  For on call review [email or web address].

## 2023-09-27 NOTE — Assessment & Plan Note (Signed)
History of compensated cirrhosis secondary to NASH.  No evidence of complications at this time.

## 2023-09-28 DIAGNOSIS — I4892 Unspecified atrial flutter: Secondary | ICD-10-CM | POA: Diagnosis not present

## 2023-09-28 DIAGNOSIS — I5042 Chronic combined systolic (congestive) and diastolic (congestive) heart failure: Secondary | ICD-10-CM | POA: Diagnosis not present

## 2023-09-28 DIAGNOSIS — J439 Emphysema, unspecified: Secondary | ICD-10-CM | POA: Diagnosis not present

## 2023-09-28 DIAGNOSIS — G4733 Obstructive sleep apnea (adult) (pediatric): Secondary | ICD-10-CM | POA: Diagnosis not present

## 2023-09-28 DIAGNOSIS — Z952 Presence of prosthetic heart valve: Secondary | ICD-10-CM | POA: Diagnosis not present

## 2023-09-28 DIAGNOSIS — Z954 Presence of other heart-valve replacement: Secondary | ICD-10-CM | POA: Diagnosis not present

## 2023-09-28 DIAGNOSIS — F17210 Nicotine dependence, cigarettes, uncomplicated: Secondary | ICD-10-CM | POA: Diagnosis not present

## 2023-09-28 DIAGNOSIS — K7460 Unspecified cirrhosis of liver: Secondary | ICD-10-CM | POA: Diagnosis not present

## 2023-09-28 DIAGNOSIS — I483 Typical atrial flutter: Secondary | ICD-10-CM | POA: Diagnosis not present

## 2023-09-28 DIAGNOSIS — I10 Essential (primary) hypertension: Secondary | ICD-10-CM | POA: Diagnosis not present

## 2023-09-28 LAB — BASIC METABOLIC PANEL
Anion gap: 7 (ref 5–15)
BUN: 10 mg/dL (ref 6–20)
CO2: 23 mmol/L (ref 22–32)
Calcium: 8.8 mg/dL — ABNORMAL LOW (ref 8.9–10.3)
Chloride: 106 mmol/L (ref 98–111)
Creatinine, Ser: 0.74 mg/dL (ref 0.44–1.00)
GFR, Estimated: >60 mL/min (ref >60)
Glucose, Bld: 85 mg/dL (ref 70–99)
Potassium: 3.6 mmol/L (ref 3.5–5.1)
Sodium: 136 mmol/L (ref 135–145)

## 2023-09-28 LAB — GLUCOSE, CAPILLARY: Glucose-Capillary: 81 mg/dL (ref 70–99)

## 2023-09-28 LAB — CBC
HCT: 39 % (ref 36.0–46.0)
Hemoglobin: 12.7 g/dL (ref 12.0–15.0)
MCH: 29.2 pg (ref 26.0–34.0)
MCHC: 32.6 g/dL (ref 30.0–36.0)
MCV: 89.7 fL (ref 80.0–100.0)
Platelets: 236 10*3/uL (ref 150–400)
RBC: 4.35 MIL/uL (ref 3.87–5.11)
RDW: 15.9 % — ABNORMAL HIGH (ref 11.5–15.5)
WBC: 5.3 10*3/uL (ref 4.0–10.5)
nRBC: 0 % (ref 0.0–0.2)

## 2023-09-28 LAB — HEPARIN LEVEL (UNFRACTIONATED)
Heparin Unfractionated: 0.66 [IU]/mL (ref 0.30–0.70)
Heparin Unfractionated: 0.66 [IU]/mL (ref 0.30–0.70)

## 2023-09-28 LAB — PROTIME-INR
INR: 2.2 — ABNORMAL HIGH (ref 0.8–1.2)
Prothrombin Time: 24.2 s — ABNORMAL HIGH (ref 11.4–15.2)

## 2023-09-28 LAB — HIV ANTIBODY (ROUTINE TESTING W REFLEX): HIV Screen 4th Generation wRfx: NONREACTIVE

## 2023-09-28 MED ORDER — WARFARIN SODIUM 6 MG PO TABS: 9.0000 mg | ORAL_TABLET | Freq: Once | ORAL | Status: DC

## 2023-09-28 MED ORDER — WARFARIN SODIUM 7.5 MG PO TABS
7.5000 mg | ORAL_TABLET | Freq: Once | ORAL | Status: AC
  Administered 2023-09-28: 7.5 mg via ORAL
  Filled 2023-09-28: qty 1

## 2023-09-28 MED ORDER — METOPROLOL SUCCINATE ER 25 MG PO TB24: 25.0000 mg | ORAL_TABLET | Freq: Every day | ORAL | 0 refills | Status: DC

## 2023-09-28 MED ORDER — ENOXAPARIN SODIUM 100 MG/ML IJ SOSY
1.5000 mg/kg | PREFILLED_SYRINGE | Freq: Once | INTRAMUSCULAR | Status: AC
  Administered 2023-09-28: 165 mg via SUBCUTANEOUS
  Filled 2023-09-28: qty 2

## 2023-09-28 NOTE — Progress Notes (Signed)
[facility]IN[facility] CARDIOLOGY PROGRESS NOTE       Patient ID: Shelly Huynh MRN: [id number] DOB/AGE: 1970-11-29 52 y.o.  Admit date: 09/27/2023 Referring Physician Dr. Willy Eddy Primary Physician Barbette Reichmann, MD  Primary Cardiologist Dr. Clotilde Dieter Reason for Consultation atrial flutter  HPI: Shelly Huynh is a 52 y.o. female  with a past medical history of paroxysmal atrial flutter, hypertension, history of mechanical aortic valve placement, history of mechanical mitral valve replacement, history of bioprosthetic tricuspid valve replacement, anticoagulated on warfarin, CAD seen by CT, OSA, palpitations, hypercholesterolemia, type 2 diabetes who presented to the ED on 09/27/2023 for rapid heart rate, palpitations. Cardiology was consulted for further evaluation.    Interval history: -Patient reports she feels well this AM. Denies any recurrence of palpitations.  -Remains in NSR this AM on tele.  -BP low overnight. Borderline high this AM. Reportedly has low BP at home, asymptomatic.   Review of systems complete and found to be negative unless listed above    Past Medical History:  Diagnosis Date   Anemia    Anxiety    a.) on BZO (diazepam) PRN   Aortic atherosclerosis (HCC)    Aortic valve stenosis and insufficiency, rheumatic    a.) s/p AVR 04/03/2015 - 19 mm St. Jude mechanical valve   Arthritis    Asthma    ATN (acute tubular necrosis) (HCC)    Cardiomegaly    CHF (congestive heart failure) (HCC)    a.) TTE 08/13/2022: EF >55%, mod LAE, mild RV/RAE, triv-mild panvalvular regurgitation, G3DD.   Chronic anticoagulation    Chronic constipation    a.) on linaclotide   Chronic lower back pain    Cirrhosis (HCC)    Coronary artery calcification seen on CT scan    a.) LHC 12/05/2014: normal coronaries   Depression    DOE (dyspnea on exertion)    Emphysema of lung (HCC)    GERD (gastroesophageal reflux disease)    Helicobacter pylori infection     History of hiatal hernia    History of methicillin resistant staphylococcus aureus (MRSA)    HTN (hypertension)    Hyperaldosteronism (HCC)    Hyperlipidemia    Hypertension    IBS (irritable bowel syndrome)    Long term current use of anticoagulant    a.) warfarin   Lumbar facet joint syndrome    Migraines    Mitral stenosis with insufficiency, rheumatic    a.) s/p MVR 04/03/2015 - 25 mm St. Jude mechanical valve   Moderate tricuspid regurgitation    a.) s/p TVR 04/03/2015 - 21 mm Mosaic porcine valve   NASH (nonalcoholic steatohepatitis)    Obesity    OSA on CPAP    Palpitations    Paroxysmal atrial flutter (HCC)    a.) CHA2DS2VASc = 5 (sex, CHF, HTN, vascular disease history, T2DM);  b.) rate/rhythm maintained without pharmacological intervention; chronically anticoagulated with warfarin   Sciatic pain    T2DM (type 2 diabetes mellitus) (HCC)    Umbilical hernia    a.) s/p repair 09/2004   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HCC)    a.) s/p aortic, mitral, and tricuspid valve replacements 04/03/2015   Vitamin D deficiency     Past Surgical History:  Procedure Laterality Date   ABDOMINAL HYSTERECTOMY  2010   AORTIC VALVE REPLACEMENT  04/03/2015   Procedure: AORTIC VALVE REPLACEMENT (19 mm St. Jude Regent mechanical valve); Location: [facility]; Surgeon: Chapman Fitch, MD   ARTERY BIOPSY N/A 10/03/2022   Procedure: BIOPSY TEMPORAL ARTERY;  Surgeon: Carolan Shiver, MD;  Location: [facility];  Service: General;  Laterality: N/A;   BREAST BIOPSY Left 07/25/2017   BENIGN BREAST TISSUE WITH MICROCALCIFICATIONS   BREAST BIOPSY Right 10/22/2019   Affirm Bx- X-clip- CSL, no atypia   BREAST BIOPSY Right 10/22/2019   Affirm Bx- Ribbon clip- CSL, no atypia   BREAST BIOPSY Right 11/20/2019   Procedure: BREAST BIOPSY WITH NEEDLE LOCALIZATION x 2;  Surgeon: Carolan Shiver, MD;  Location: [facility];  Service: General;  Laterality: Right;   BREAST EXCISIONAL BIOPSY Right 1992   NEG   BREAST  LUMPECTOMY Right 11/20/2019   complex sclerosing lesion x 2   CESAREAN SECTION N/A 2000   CESAREAN SECTION N/A 1987   CHOLECYSTECTOMY  07/2020   COLONOSCOPY WITH PROPOFOL N/A 09/21/2020   Procedure: COLONOSCOPY WITH PROPOFOL;  Surgeon: Toledo, Boykin Nearing, MD;  Location: [facility];  Service: Gastroenterology;  Laterality: N/A;   ESOPHAGOGASTRODUODENOSCOPY (EGD) WITH PROPOFOL N/A 10/07/2016   Procedure: ESOPHAGOGASTRODUODENOSCOPY (EGD) WITH PROPOFOL;  Surgeon: Wyline Mood, MD;  Location: [facility];  Service: Endoscopy;  Laterality: N/A;   ESOPHAGOGASTRODUODENOSCOPY (EGD) WITH PROPOFOL N/A 09/21/2020   Procedure: ESOPHAGOGASTRODUODENOSCOPY (EGD) WITH PROPOFOL;  Surgeon: Toledo, Boykin Nearing, MD;  Location: [facility];  Service: Gastroenterology;  Laterality: N/A;   LAPAROSCOPIC GASTRIC BAND REMOVAL WITH LAPAROSCOPIC GASTRIC SLEEVE RESECTION     LEFT HEART CATH AND CORONARY ANGIOGRAPHY Left 12/05/2014   Procedure: LEFT HEART CATH AND CORONARY ANGIOGRAPHY; Location: [facility]; Surgeon: Arnoldo Hooker, MD   LIPOSUCTION TRUNK  1996   MITRAL VALVE REPLACEMENT  04/03/2015   Procedure: MITRAL VALVE REPLACEMENT (25 mm St. Jude Mechanical valve); Location: [facility]; Surgeon: Chapman Fitch, MD   TRICUSPID VALVE REPLACEMENT  04/03/2015   Procedure: TRICUSPID VALVE REPLACEMENT (21 mm Mosaic porcine valve); Location: [facility]; Surgeon: Chapman Fitch, MD   TUBAL LIGATION  2000   UMBILICAL HERNIA REPAIR  2005   VENTRAL HERNIA REPAIR      Medications Prior to Admission  Medication Sig Dispense Refill Last Dose   albuterol (PROVENTIL) (2.5 MG/3ML) 0.083% nebulizer solution Take 2.5 mg by nebulization 3 (three) times daily as needed for shortness of breath or wheezing.   unk   albuterol (VENTOLIN HFA) 108 (90 Base) MCG/ACT inhaler Inhale 2 puffs into the lungs every 6 (six) hours as needed for wheezing or shortness of breath.   unk   aspirin EC 81 MG tablet Take 81 mg daily by mouth.   09/26/2023   candesartan  (ATACAND) 8 MG tablet Take 8 mg by mouth at bedtime.    09/26/2023   cyclobenzaprine (FLEXERIL) 10 MG tablet Take 1 tablet by mouth 3 (three) times daily as needed.   unk   fluticasone (FLONASE) 50 MCG/ACT nasal spray Place 2 sprays into both nostrils daily as needed for allergies or rhinitis.   unk   HYDROcodone-acetaminophen (NORCO/VICODIN) 5-325 MG tablet Take 1 tablet by mouth daily as needed for severe pain (pain score 7-10) or moderate pain (pain score 4-6).   09/26/2023   ibuprofen (ADVIL) 800 MG tablet Take 800 mg by mouth 3 (three) times daily as needed for fever, headache, mild pain (pain score 1-3) or moderate pain (pain score 4-6).   unk   iron polysaccharides (NIFEREX) 150 MG capsule Take 150 mg by mouth daily.   09/26/2023   lansoprazole (PREVACID) 30 MG capsule Take 30 mg by mouth daily.   09/26/2023   levalbuterol (XOPENEX) 1.25 MG/3ML nebulizer solution Take 1.25 mg by nebulization every 8 (eight) hours  as needed for shortness of breath or wheezing.   unk   levocetirizine (XYZAL) 5 MG tablet Take 1 tablet by mouth every evening.   09/26/2023   linaclotide (LINZESS) 72 MCG capsule Take 72 mcg by mouth daily as needed.   unk   montelukast (SINGULAIR) 10 MG tablet Take 1 tablet by mouth at bedtime.   09/26/2023   OZEMPIC, 2 MG/DOSE, 8 MG/3ML SOPN Inject 2 mg into the skin once a week.   09/21/2023   pravastatin (PRAVACHOL) 20 MG tablet Take 20 mg by mouth daily.   09/26/2023   pregabalin (LYRICA) 75 MG capsule Take 75 mg by mouth 2 (two) times daily.   09/26/2023   spironolactone (ALDACTONE) 25 MG tablet Take 1 tablet by mouth daily.   Past Week   spironolactone (ALDACTONE) 50 MG tablet Take 50 mg by mouth daily.   Past Week   Vitamin D, Ergocalciferol, (DRISDOL) 1.25 MG (50000 UNIT) CAPS capsule Take 50,000 Units by mouth once a week. sunday   09/24/2023   warfarin (COUMADIN) 7.5 MG tablet Take 1 tablet (7.5 mg total) one time only at 6 PM by mouth. (Patient taking differently: Take 6 mg by  mouth daily. Coumadin dosage according to lab results) 30 tablet 0 09/26/2023   enoxaparin (LOVENOX) 120 MG/0.8ML injection Inject 120 mg into the skin 2 (two) times daily. Coumadin/lovenox bridge for temporal artery biopsy surgery. Following calendar from Dr. Gwen Pounds   09/24/2023   memantine (NAMENDA) 5 MG tablet Take 5 mg by mouth 2 (two) times daily.  (Patient not taking: Reported on 09/27/2023)   Not Taking   Social History   Socioeconomic History   Marital status: Married    Spouse name: Terrance   Number of children: 2   Years of education: Not on file   Highest education level: Not on file  Occupational History   Not on file  Tobacco Use   Smoking status: Every Day    Current packs/day: 0.50    Average packs/day: 0.5 packs/day for 25.0 years (12.5 ttl pk-yrs)    Types: Cigarettes   Smokeless tobacco: Never  Vaping Use   Vaping status: Never Used  Substance and Sexual Activity   Alcohol use: No   Drug use: No   Sexual activity: Not on file  Other Topics Concern   Not on file  Social History Narrative   Not on file   Social Determinants of Health   Financial Resource Strain: Medium Risk (10/02/2017)   Overall Financial Resource Strain (CARDIA)    Difficulty of Paying Living Expenses: Somewhat hard  Food Insecurity: Food Insecurity Present (10/02/2017)   Hunger Vital Sign    Worried About Running Out of Food in the Last Year: Often true    Ran Out of Food in the Last Year: Often true  Transportation Needs: No Transportation Needs (10/02/2017)   PRAPARE - Administrator, Civil Service (Medical): No    Lack of Transportation (Non-Medical): No  Physical Activity: Unknown (10/02/2017)   Exercise Vital Sign    Days of Exercise per Week: 0 days    Minutes of Exercise per Session: Not on file  Stress: Stress Concern Present (10/02/2017)   [employer] of Occupational Health - Occupational Stress Questionnaire    Feeling of Stress : Very much  Social  Connections: Socially Integrated (10/02/2017)   Social Connection and Isolation Panel [NHANES]    Frequency of Communication with Friends and Family: More than three times a week  Frequency of Social Gatherings with Friends and Family: Once a week    Attends Religious Services: More than 4 times per year    Active Member of Clubs or Organizations: Yes    Attends Banker Meetings: More than 4 times per year    Marital Status: Married  Catering manager Violence: Not At Risk (10/02/2017)   Humiliation, Afraid, Rape, and Kick questionnaire    Fear of Current or Ex-Partner: No    Emotionally Abused: No    Physically Abused: No    Sexually Abused: No    Family History  Problem Relation Age of Onset   Thyroid cancer Mother    Uterine cancer Mother    Breast cancer Paternal Grandmother    Lung cancer Paternal Grandmother    Pancreatic cancer Cousin      Vitals:   09/27/23 1921 09/27/23 2334 09/28/23 0000 09/28/23 0339  BP: 121/81 126/65  (!) 97/51  Pulse: 62 (!) 58  (!) 51  Resp: 18 18  18   Temp: 97.8 F (36.6 C) 98.1 F (36.7 C)  98.2 F (36.8 C)  TempSrc: Oral     SpO2: 97% 100% 99% 96%  Weight:      Height:        PHYSICAL EXAM General: Well-appearing, well nourished, in no acute distress. HEENT: Normocephalic and atraumatic. Neck: No JVD.  Lungs: Normal respiratory effort on room air. Clear bilaterally to auscultation. No wheezes, crackles, rhonchi.  Heart: Irregularly irregular, controlled rate. Normal S1 and S2 without gallops. + murmur. Abdomen: Non-distended appearing.  Msk: Normal strength and tone for age. Extremities: Warm and well perfused. No clubbing, cyanosis.  No edema.  Neuro: Alert and oriented X 3. Psych: Answers questions appropriately.   Labs: Basic Metabolic Panel: Recent Labs    09/27/23 1007 09/28/23 0138  NA 136 136  K 3.6 3.6  CL 106 106  CO2 21* 23  GLUCOSE 77 85  BUN 8 10  CREATININE 0.65 0.74  CALCIUM 9.3 8.8*  MG  2.1  --    Liver Function Tests: No results for input(s): "AST", "ALT", "ALKPHOS", "BILITOT", "PROT", "ALBUMIN" in the last 72 hours. No results for input(s): "LIPASE", "AMYLASE" in the last 72 hours. CBC: Recent Labs    09/27/23 1007 09/28/23 0138  WBC 5.2 5.3  HGB 15.0 12.7  HCT 46.9* 39.0  MCV 91.8 89.7  PLT 292 236   Cardiac Enzymes: Recent Labs    09/27/23 1007 09/27/23 1207  TROPONINIHS 13 14   BNP: No results for input(s): "BNP" in the last 72 hours. D-Dimer: No results for input(s): "DDIMER" in the last 72 hours. Hemoglobin A1C: No results for input(s): "HGBA1C" in the last 72 hours. Fasting Lipid Panel: No results for input(s): "CHOL", "HDL", "LDLCALC", "TRIG", "CHOLHDL", "LDLDIRECT" in the last 72 hours. Thyroid Function Tests: No results for input(s): "TSH", "T4TOTAL", "T3FREE", "THYROIDAB" in the last 72 hours.  Invalid input(s): "FREET3" Anemia Panel: No results for input(s): "VITAMINB12", "FOLATE", "FERRITIN", "TIBC", "IRON", "RETICCTPCT" in the last 72 hours.   Radiology: DG Chest 2 View  Result Date: 09/27/2023 CLINICAL DATA:  Palpitations EXAM: CHEST - 2 VIEW COMPARISON:  Chest radiograph dated 04/09/2021 FINDINGS: Normal lung volumes. Mild bilateral interstitial opacities. No pleural effusion or pneumothorax. Similar enlarged, postsurgical cardiomediastinal silhouette. Median sternotomy wires are nondisplaced. IMPRESSION: 1. Mild bilateral interstitial opacities, which may represent pulmonary edema. 2. Similar cardiomegaly. Electronically Signed   By: Agustin Cree M.D.   On: 09/27/2023 11:28  ECHO 2022: INTERPRETATION  NORMAL LEFT VENTRICULAR SYSTOLIC FUNCTION  NORMAL RIGHT VENTRICULAR SYSTOLIC FUNCTION  MILD VALVULAR REGURGITATION (See above)  NO VALVULAR STENOSIS  TRICUSPID VALVE PROSTHESIS APPEARS WELL-SEATED WITH NORMAL FUNCTION  MITRAL VALVE PROSTHESIS APPEARS WELL-SEATED WITH NORMAL FUNCTION  AORTIC VALVE PROSTHESIS APPEARS WELL-SEATED WITH  NORMAL FUNCTION   TELEMETRY reviewed by me 09/28/2023: NSR rate 50-60s  EKG reviewed by me: atrial flutter rate 120 bpm  Data reviewed by me 09/28/2023: last 24h vitals tele labs imaging I/O hospitalist progress note  Principal Problem:   Atrial flutter with rapid ventricular response (HCC) Active Problems:   Benign essential HTN   Chronic combined systolic (congestive) and diastolic (congestive) heart failure (HCC)   Obstructive apnea   H/O aortic valve replacement   Diabetes mellitus (HCC)   Cirrhosis of liver without ascites (HCC)    ASSESSMENT AND PLAN:  Shelly Huynh is a 52 y.o. female  with a past medical history of paroxysmal atrial flutter, hypertension, history of mechanical aortic valve placement, history of mechanical mitral valve replacement, history of bioprosthetic tricuspid valve replacement, anticoagulated on warfarin, CAD seen by CT, OSA, palpitations, hypercholesterolemia, type 2 diabetes who presented to the ED on 09/27/2023 for rapid heart rate, palpitations. Cardiology was consulted for further evaluation.   # Atrial flutter with variable block # Hypertension Patient with a hx of paroxysmal atrial flutter presenting with 1 day of palpitations and heart racing. Found to be in atrial flutter on EKG in the ED. Telemetry shows flutter with variable block. Troponins 13 > 14. No electrolyte derangement on labs. Had been holding warfarin recently for dental procedure but restarted late last week.  INR subtherapeutic today at 1.9.  Patient converted to sinus bradycardia in the upper 50s during my evaluation 09/27/23.  -Continue metoprolol succinate 25 mg daily. Continue home BP meds. -Monitor for recurrence of atrial flutter, can consider TEE/CV if she has recurrence. -Will plan to set patient up for outpatient EP evaluation.  # Hx mechanical aortic and mitral valve replacement # Hx bioprosthetic tricuspid valve replacement Last echo 2022 with well-seated and  appropriately functioning valves. On warfarin at home with INR goal 2.5-3.5.  -Plan to resume warfarin prior to discharge. Plan per pharmacy.  Ok for discharge today from a cardiac perspective. Will arrange for follow up in clinic with Dr. Melton Alar in 1-2 weeks.    This patient's plan of care was discussed and created with Dr. Melton Alar and she is in agreement.  Signed: Gale Journey, PA-C  09/28/2023, 7:46 AM [facility]

## 2023-09-28 NOTE — Progress Notes (Signed)
PHARMACY - ANTICOAGULATION CONSULT NOTE  Pharmacy Consult for Heparin & Warfarin  Indication: atrial fibrillation with mechanical valve replacement.   Allergies  Allergen Reactions   Ivp Dye [Iodinated Contrast Media] Anaphylaxis and Hives    Especially CT contrast media; tightness in chest   Sulfa Antibiotics Hives and Anaphylaxis   Banana Itching   Guaifenesin Hives   Hctz [Hydrochlorothiazide] Hives   Keflex [Cephalexin] Other (See Comments)    Abdominal pain Tolerated 1st (CEFAZOLIN) and 2nd (CEFUROXIME) generation cephalosporins on 04/02/2012, 09/07/2017, 11/20/2019, and 07/14/2020 without documented ADRs.   Losartan Itching   Povidone Iodine Hives   Zofran [Ondansetron] Itching   Lisinopril Hives and Rash   Mucinex [Guaifenesin Er] Hives and Rash   Patient Measurements: Height: 5\' 3"  (160 cm) Weight: 110.2 kg (243 lb) IBW/kg (Calculated) : 52.4 Heparin Dosing Weight: 78.9 kg  Vital Signs: Temp: 98 F (36.7 C) (11/07 0804) BP: 143/78 (11/07 0804) Pulse Rate: 56 (11/07 0804)  Labs: Recent Labs    09/27/23 1007 09/27/23 1207 09/27/23 1855 09/28/23 0138 09/28/23 0813  HGB 15.0  --   --  12.7  --   HCT 46.9*  --   --  39.0  --   PLT 292  --   --  236  --   LABPROT 21.6*  --   --  24.2*  --   INR 1.9*  --   --  2.2*  --   HEPARINUNFRC  --   --  0.16* 0.66 0.66  CREATININE 0.65  --   --  0.74  --   TROPONINIHS 13 14  --   --   --    Estimated Creatinine Clearance: 96.9 mL/min (by C-G formula based on SCr of 0.74 mg/dL).  Medical History: Past Medical History:  Diagnosis Date   Anemia    Anxiety    a.) on BZO (diazepam) PRN   Aortic atherosclerosis (HCC)    Aortic valve stenosis and insufficiency, rheumatic    a.) s/p AVR 04/03/2015 - 19 mm St. Jude mechanical valve   Arthritis    Asthma    ATN (acute tubular necrosis) (HCC)    Cardiomegaly    CHF (congestive heart failure) (HCC)    a.) TTE 08/13/2022: EF >55%, mod LAE, mild RV/RAE, triv-mild  panvalvular regurgitation, G3DD.   Chronic anticoagulation    Chronic constipation    a.) on linaclotide   Chronic lower back pain    Cirrhosis (HCC)    Coronary artery calcification seen on CT scan    a.) LHC 12/05/2014: normal coronaries   Depression    DOE (dyspnea on exertion)    Emphysema of lung (HCC)    GERD (gastroesophageal reflux disease)    Helicobacter pylori infection    History of hiatal hernia    History of methicillin resistant staphylococcus aureus (MRSA)    HTN (hypertension)    Hyperaldosteronism (HCC)    Hyperlipidemia    Hypertension    IBS (irritable bowel syndrome)    Long term current use of anticoagulant    a.) warfarin   Lumbar facet joint syndrome    Migraines    Mitral stenosis with insufficiency, rheumatic    a.) s/p MVR 04/03/2015 - 25 mm St. Jude mechanical valve   Moderate tricuspid regurgitation    a.) s/p TVR 04/03/2015 - 21 mm Mosaic porcine valve   NASH (nonalcoholic steatohepatitis)    Obesity    OSA on CPAP    Palpitations    Paroxysmal atrial  flutter (HCC)    a.) CHA2DS2VASc = 5 (sex, CHF, HTN, vascular disease history, T2DM);  b.) rate/rhythm maintained without pharmacological intervention; chronically anticoagulated with warfarin   Sciatic pain    T2DM (type 2 diabetes mellitus) (HCC)    Umbilical hernia    a.) s/p repair 09/2004   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HCC)    a.) s/p aortic, mitral, and tricuspid valve replacements 04/03/2015   Vitamin D deficiency    Assessment: Patient is a 52 yo female that presented to ED with atrial flutter with RVR. Patient with a history of a mechanical aortic & mitral valve replacement on warfarin. Her warfarin was recently held for a dental procedure. Pharmacy has been consulted for warfarin and heparin management.   Warfarin Home Regimen:  Confirmed with patient as 7.5 mg on Sundays and 6 mg all other days   DDI: aspirin   Goal of Therapy:  Heparin level 0.3 - 0.7 units/ml INR goal 2.5 - 3.5   Monitor platelets by anticoagulation protocol: Yes  Labs: 11/6 1855 HL = 0.16; subtherapeutic    Plan:  INR is still subtherapeutic. Transition from heparin to enoxaparin 1.5 mg/kg x 1. INR is trending up. Will give warfarin 7.5 mg x 1 today (~25% increase from home dose). Predict INR will be therapeutic tomorrow. Recommend restarting home regimen at discharge with close follow up with clinic.

## 2023-09-28 NOTE — Hospital Course (Signed)
52 year old female with paroxysmal atrial fibrillation on Coumadin, mechanical aortic valve, bioprosthetic tricuspid valve, CAD, OSA, type 2 diabetes mellitus, cirrhosis secondary to [place], heart failure with preserved ejection fraction, hypertension, hyperlipidemia presented to the ED with palpitations.  In the ED she was found to be in rapid atrial flutter.  Patient spontaneously converted.  Patient was started on Toprol.  11/7.  Patient was feeling better and wanting to go home.  Her INR was 2.2.  She was given a dose of 7.5 mg this morning of Coumadin and 1 Lovenox injection.  She will go back on her usual Coumadin dose.  She was given a prescription for Toprol-XL.

## 2023-09-28 NOTE — Progress Notes (Signed)
PHARMACY - ANTICOAGULATION CONSULT NOTE  Pharmacy Consult for Heparin & Warfarin  Indication: atrial fibrillation  Allergies  Allergen Reactions   Ivp Dye [Iodinated Contrast Media] Anaphylaxis and Hives    Especially CT contrast media; tightness in chest   Sulfa Antibiotics Hives and Anaphylaxis   Banana Itching   Guaifenesin Hives   Hctz [Hydrochlorothiazide] Hives   Keflex [Cephalexin] Other (See Comments)    Abdominal pain Tolerated 1st (CEFAZOLIN) and 2nd (CEFUROXIME) generation cephalosporins on 04/02/2012, 09/07/2017, 11/20/2019, and 07/14/2020 without documented ADRs.   Losartan Itching   Povidone Iodine Hives   Zofran [Ondansetron] Itching   Lisinopril Hives and Rash   Mucinex [Guaifenesin Er] Hives and Rash   Patient Measurements: Height: 5\' 3"  (160 cm) Weight: 110.2 kg (243 lb) IBW/kg (Calculated) : 52.4 Heparin Dosing Weight: 78.9 kg  Vital Signs: Temp: 98.1 F (36.7 C) (11/06 2334) Temp Source: Oral (11/06 1921) BP: 126/65 (11/06 2334) Pulse Rate: 58 (11/06 2334)  Labs: Recent Labs    09/27/23 1007 09/27/23 1207 09/27/23 1855 09/28/23 0138  HGB 15.0  --   --  12.7  HCT 46.9*  --   --  39.0  PLT 292  --   --  236  LABPROT 21.6*  --   --  24.2*  INR 1.9*  --   --  2.2*  HEPARINUNFRC  --   --  0.16* 0.66  CREATININE 0.65  --   --  0.74  TROPONINIHS 13 14  --   --    Estimated Creatinine Clearance: 96.9 mL/min (by C-G formula based on SCr of 0.74 mg/dL).  Medical History: Past Medical History:  Diagnosis Date   Anemia    Anxiety    a.) on BZO (diazepam) PRN   Aortic atherosclerosis (HCC)    Aortic valve stenosis and insufficiency, rheumatic    a.) s/p AVR 04/03/2015 - 19 mm St. Jude mechanical valve   Arthritis    Asthma    ATN (acute tubular necrosis) (HCC)    Cardiomegaly    CHF (congestive heart failure) (HCC)    a.) TTE 08/13/2022: EF >55%, mod LAE, mild RV/RAE, triv-mild panvalvular regurgitation, G3DD.   Chronic anticoagulation     Chronic constipation    a.) on linaclotide   Chronic lower back pain    Cirrhosis (HCC)    Coronary artery calcification seen on CT scan    a.) LHC 12/05/2014: normal coronaries   Depression    DOE (dyspnea on exertion)    Emphysema of lung (HCC)    GERD (gastroesophageal reflux disease)    Helicobacter pylori infection    History of hiatal hernia    History of methicillin resistant staphylococcus aureus (MRSA)    HTN (hypertension)    Hyperaldosteronism (HCC)    Hyperlipidemia    Hypertension    IBS (irritable bowel syndrome)    Long term current use of anticoagulant    a.) warfarin   Lumbar facet joint syndrome    Migraines    Mitral stenosis with insufficiency, rheumatic    a.) s/p MVR 04/03/2015 - 25 mm St. Jude mechanical valve   Moderate tricuspid regurgitation    a.) s/p TVR 04/03/2015 - 21 mm Mosaic porcine valve   NASH (nonalcoholic steatohepatitis)    Obesity    OSA on CPAP    Palpitations    Paroxysmal atrial flutter (HCC)    a.) CHA2DS2VASc = 5 (sex, CHF, HTN, vascular disease history, T2DM);  b.) rate/rhythm maintained without pharmacological intervention; chronically  anticoagulated with warfarin   Sciatic pain    T2DM (type 2 diabetes mellitus) (HCC)    Umbilical hernia    a.) s/p repair 09/2004   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HCC)    a.) s/p aortic, mitral, and tricuspid valve replacements 04/03/2015   Vitamin D deficiency    Assessment: Patient is a 52 yo female that presented to ED with atrial flutter with RVR. Patient with a history of a mechanical aortic & mitral valve replacement on warfarin. Her warfarin was recently held for a dental procedure. Baseline INR 1.9. Pharmacy has been consulted for warfarin and heparin management.   Warfarin Home Regimen:  Confirmed with patient as 7.5 mg on Sundays and 6 mg all other days   DDI: aspirin   Goal of Therapy:  Heparin level 0.3 - 0.7 units/ml INR goal 2.5 - 3.5  Monitor platelets by anticoagulation  protocol: Yes  Labs: 11/6 1855 HL = 0.16; subtherapeutic  11/7 0138 HL = 0.66, therapeutic X 1   Plan:  11/7:  HL @ 0138 = 0.66, therapeutic X 1 - Will continue pt on current rate and recheck HL in 6 hrs @ 0800.   Give warfarin 7.5 mg tonight given subtherapeutic INR  Will plan to bridge warfarin with heparin until INR is therapeutic for at least 24 hours  Check HL 6 hours after rate change  Monitor CBC and INR daily   Rolando Hessling D

## 2023-09-28 NOTE — Assessment & Plan Note (Signed)
CPAP.  

## 2023-09-28 NOTE — Discharge Summary (Signed)
Physician Discharge Summary   Patient: Shelly Huynh MRN: [id number] DOB: 12-Apr-1971  Admit date:     09/27/2023  Discharge date: 09/28/23  Discharge Physician: Alford Highland   PCP: Barbette Reichmann, MD   Recommendations at discharge:   Follow-up PCP 5 days Follow-up cardiology 1 week  Discharge Diagnoses: Principal Problem:   Atrial flutter with rapid ventricular response (HCC) Active Problems:   Chronic combined systolic (congestive) and diastolic (congestive) heart failure (HCC)   H/O aortic valve replacement   Benign essential HTN   Obstructive apnea   Diabetes mellitus (HCC)   Cirrhosis of liver without ascites (HCC)   Obesity, Class III, BMI 40-49.9 (morbid obesity) [facility])    Hospital Course: 52 year old female with paroxysmal atrial fibrillation on Coumadin, mechanical aortic valve, bioprosthetic tricuspid valve, CAD, OSA, type 2 diabetes mellitus, cirrhosis secondary to Taylor, heart failure with preserved ejection fraction, hypertension, hyperlipidemia presented to the ED with palpitations.  In the ED she was found to be in rapid atrial flutter.  Patient spontaneously converted.  Patient was started on Toprol.  11/7.  Patient was feeling better and wanting to go home.  Her INR was 2.2.  She was given a dose of 7.5 mg this morning of Coumadin and 1 Lovenox injection.  She will go back on her usual Coumadin dose.  She was given a prescription for Toprol-XL.  Assessment and Plan: * Atrial flutter with rapid ventricular response (HCC) Converted on her own.  INR 2.2 today.  Given a dose of 7.5 mg of warfarin today.  1 dose of Lovenox.  Continue her usual dose of Coumadin as outpatient.  Toprol-XL prescribed for going home.  Chronic combined systolic (congestive) and diastolic (congestive) heart failure (HCC) Per chart review, patient has a history of valvular cardiomyopathy complicated by combined systolic and diastolic heart failure.  Last echocardiogram available on  epic from 2022 with EF above 55%.  Patient on Atacand, spironolactone and metoprolol.  H/O aortic valve replacement INR 2.2 this morning.  7.5 mg of Coumadin given this morning along with a Lovenox injection.  Can go back on her usual Coumadin dose.  Goal between 2.5 and 3.5.  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  No evidence of complications at this time.  Diabetes mellitus (HCC) History of well-controlled type 2 diabetes with last A1c of 6.6%. Can resume oral medications as outpatient.  Obstructive apnea CPAP  Benign essential HTN Continue Atacand and spironolactone and Toprol.         Consultants: Cardiology Procedures performed: None Disposition: Home Diet recommendation:  Cardiac and Carb modified diet DISCHARGE MEDICATION: Allergies as of 09/28/2023       Reactions   Ivp Dye [iodinated Contrast Media] Anaphylaxis, Hives   Especially CT contrast media; tightness in chest   Sulfa Antibiotics Hives, Anaphylaxis   Banana Itching   Guaifenesin Hives   Hctz [hydrochlorothiazide] Hives   Keflex [cephalexin] Other (See Comments)   Abdominal pain Tolerated 1st (CEFAZOLIN) and 2nd (CEFUROXIME) generation cephalosporins on 04/02/2012, 09/07/2017, 11/20/2019, and 07/14/2020 without documented ADRs.   Losartan Itching   Povidone Iodine Hives   Zofran [ondansetron] Itching   Lisinopril Hives, Rash   Mucinex [guaifenesin Er] Hives, Rash        Medication List     STOP taking these medications    ibuprofen 800 MG tablet Commonly known as: ADVIL  TAKE these medications    albuterol 108 (90 Base) MCG/ACT inhaler Commonly known as: VENTOLIN HFA Inhale 2 puffs into the lungs every 6 (six) hours as needed for wheezing or shortness of breath.   albuterol (2.5 MG/3ML) 0.083% nebulizer solution Commonly known as: PROVENTIL Take 2.5 mg by  nebulization 3 (three) times daily as needed for shortness of breath or wheezing.   aspirin EC 81 MG tablet Take 81 mg daily by mouth.   candesartan 8 MG tablet Commonly known as: ATACAND Take 8 mg by mouth at bedtime.   cyclobenzaprine 10 MG tablet Commonly known as: FLEXERIL Take 1 tablet by mouth 3 (three) times daily as needed.   enoxaparin 120 MG/0.8ML injection Commonly known as: LOVENOX Inject 120 mg into the skin 2 (two) times daily. Coumadin/lovenox bridge for temporal artery biopsy surgery. Following calendar from Dr. Gwen Pounds   fluticasone 50 MCG/ACT nasal spray Commonly known as: FLONASE Place 2 sprays into both nostrils daily as needed for allergies or rhinitis.   HYDROcodone-acetaminophen 5-325 MG tablet Commonly known as: NORCO/VICODIN Take 1 tablet by mouth daily as needed for severe pain (pain score 7-10) or moderate pain (pain score 4-6).   iron polysaccharides 150 MG capsule Commonly known as: NIFEREX Take 150 mg by mouth daily.   lansoprazole 30 MG capsule Commonly known as: PREVACID Take 30 mg by mouth daily.   levalbuterol 1.25 MG/3ML nebulizer solution Commonly known as: XOPENEX Take 1.25 mg by nebulization every 8 (eight) hours as needed for shortness of breath or wheezing.   levocetirizine 5 MG tablet Commonly known as: XYZAL Take 1 tablet by mouth every evening.   linaclotide 72 MCG capsule Commonly known as: LINZESS Take 72 mcg by mouth daily as needed.   memantine 5 MG tablet Commonly known as: NAMENDA Take 5 mg by mouth 2 (two) times daily.   metoprolol succinate 25 MG 24 hr tablet Commonly known as: TOPROL-XL Take 1 tablet (25 mg total) by mouth daily.   montelukast 10 MG tablet Commonly known as: SINGULAIR Take 1 tablet by mouth at bedtime.   Ozempic (2 MG/DOSE) 8 MG/3ML Sopn Generic drug: Semaglutide (2 MG/DOSE) Inject 2 mg into the skin once a week.   pravastatin 20 MG tablet Commonly known as: PRAVACHOL Take 20 mg by  mouth daily.   pregabalin 75 MG capsule Commonly known as: LYRICA Take 75 mg by mouth 2 (two) times daily.   spironolactone 50 MG tablet Commonly known as: ALDACTONE Take 50 mg by mouth daily.   spironolactone 25 MG tablet Commonly known as: ALDACTONE Take 1 tablet by mouth daily.   Vitamin D (Ergocalciferol) 1.25 MG (50000 UNIT) Caps capsule Commonly known as: DRISDOL Take 50,000 Units by mouth once a week. sunday   warfarin 6 MG tablet Commonly known as: COUMADIN Take 6 mg by mouth daily. Take 7.5 mg on Sundays and 6 mg all other days What changed: Another medication with the same name was removed. Continue taking this medication, and follow the directions you see here.        Follow-up Information     Barbette Reichmann, MD Follow up in 5 day(s).   Specialty: Internal Medicine Contact information: [address] [place] [address] [phone]         Clotilde Dieter, DO Follow up in 1 week(s).   Specialty: Cardiology Contact information: [address] [place] [address] [phone]  Discharge Exam: Filed Weights   09/27/23 1005  Weight: 110.2 kg   Physical Exam HENT:     Head: Normocephalic.  Eyes:     General: Lids are normal.     Conjunctiva/sclera: Conjunctivae normal.  Cardiovascular:     Rate and Rhythm: Regular rhythm. Bradycardia present.     Heart sounds: S1 normal and S2 normal. Murmur heard.     Systolic murmur is present with a grade of 2/6.  Pulmonary:     Breath sounds: No decreased breath sounds, wheezing, rhonchi or rales.  Abdominal:     Palpations: Abdomen is soft.     Tenderness: There is no abdominal tenderness.  Musculoskeletal:     Right lower leg: No swelling.     Left lower leg: No swelling.  Skin:    General: Skin is warm.     Findings: No rash.  Neurological:     Mental Status: [name] alert and oriented to person, place, and time.      Condition at  discharge: stable  The results of significant diagnostics from this hospitalization (including imaging, microbiology, ancillary and laboratory) are listed below for reference.   Imaging Studies: DG Chest 2 View  Result Date: 09/27/2023 CLINICAL DATA:  Palpitations EXAM: CHEST - 2 VIEW COMPARISON:  Chest radiograph dated 04/09/2021 FINDINGS: Normal lung volumes. Mild bilateral interstitial opacities. No pleural effusion or pneumothorax. Similar enlarged, postsurgical cardiomediastinal silhouette. Median sternotomy wires are nondisplaced. IMPRESSION: 1. Mild bilateral interstitial opacities, which may represent pulmonary edema. 2. Similar cardiomegaly. Electronically Signed   By: Agustin Cree M.D.   On: 09/27/2023 11:28    Microbiology: Results for orders placed or performed during the hospital encounter of 09/17/20  SARS CORONAVIRUS 2 (TAT 6-24 HRS) Nasopharyngeal Nasopharyngeal Swab     Status: None   Collection Time: 09/17/20 12:22 PM   Specimen: Nasopharyngeal Swab  Result Value Ref Range Status   SARS Coronavirus 2 NEGATIVE NEGATIVE Final    Comment: (NOTE) SARS-CoV-2 target nucleic acids are NOT DETECTED.  The SARS-CoV-2 RNA is generally detectable in upper and lower respiratory specimens during the acute phase of infection. Negative results do not preclude SARS-CoV-2 infection, do not rule out co-infections with other pathogens, and should not be used as the sole basis for treatment or other patient management decisions. Negative results must be combined with clinical observations, patient history, and epidemiological information. The expected result is Negative.  Fact Sheet for Patients: HairSlick.no  Fact Sheet for Healthcare Providers: quierodirigir.com  This test is not yet approved or cleared by the [place] FDA and  has been authorized for detection and/or diagnosis of SARS-CoV-2 by FDA under an Emergency Use  Authorization (EUA). This EUA will remain  in effect (meaning this test can be used) for the duration of the COVID-19 declaration under Se ction 564(b)(1) of the Act, 21 U.S.C. section 360bbb-3(b)(1), unless the authorization is terminated or revoked sooner.  Performed at [facility], [address] [place], [place] [address]     Labs: CBC: Recent Labs  Lab 09/27/23 1007 09/28/23 0138  WBC 5.2 5.3  HGB 15.0 12.7  HCT 46.9* 39.0  MCV 91.8 89.7  PLT 292 236   Basic Metabolic Panel: Recent Labs  Lab 09/27/23 1007 09/28/23 0138  NA 136 136  K 3.6 3.6  CL 106 106  CO2 21* 23  GLUCOSE 77 85  BUN 8 10  CREATININE 0.65 0.74  CALCIUM 9.3 8.8*  MG 2.1  --  Liver Function Tests: No results for input(s): "AST", "ALT", "ALKPHOS", "BILITOT", "PROT", "ALBUMIN" in the last 168 hours. CBG: Recent Labs  Lab 09/27/23 1610 09/27/23 2114 09/28/23 0805  GLUCAP 80 80 81    Discharge time spent: greater than 30 minutes.  Signed: Alford Highland, MD [facility] 09/28/2023

## 2023-09-28 NOTE — TOC CM/SW Note (Signed)
Transition of Care [facility]) - Inpatient Brief Assessment   Patient Details  Name: Shelly Huynh MRN: [id number] Date of Birth: [birth year]  [facility]) CM/SW Contact:    Margarito Liner, LCSW Phone Number: 09/28/2023, 8:59 AM   Clinical Narrative: Patient has orders to discharge home today. Chart reviewed. No TOC needs identified. CSW signing off.  Transition of Care Asessment: Insurance and Status: Insurance coverage has been reviewed Patient has primary care physician: Yes Home environment has been reviewed: Single family home Prior level of function:: Not documented Prior/Current Home Services: No current home services Social Determinants of Health Reivew: SDOH reviewed no interventions necessary Readmission risk has been reviewed: Yes Transition of care needs: no transition of care needs at this time

## 2023-09-28 NOTE — Assessment & Plan Note (Signed)
BMI 43.05

## 2023-10-03 DIAGNOSIS — R69 Illness, unspecified: Secondary | ICD-10-CM | POA: Diagnosis not present

## 2023-10-05 DIAGNOSIS — R791 Abnormal coagulation profile: Secondary | ICD-10-CM | POA: Diagnosis not present

## 2023-10-05 DIAGNOSIS — I10 Essential (primary) hypertension: Secondary | ICD-10-CM | POA: Diagnosis not present

## 2023-10-05 DIAGNOSIS — I2510 Atherosclerotic heart disease of native coronary artery without angina pectoris: Secondary | ICD-10-CM | POA: Diagnosis not present

## 2023-10-05 DIAGNOSIS — Z952 Presence of prosthetic heart valve: Secondary | ICD-10-CM | POA: Diagnosis not present

## 2023-10-05 DIAGNOSIS — Z7901 Long term (current) use of anticoagulants: Secondary | ICD-10-CM | POA: Diagnosis not present

## 2023-10-05 DIAGNOSIS — I2584 Coronary atherosclerosis due to calcified coronary lesion: Secondary | ICD-10-CM | POA: Diagnosis not present

## 2023-10-05 DIAGNOSIS — L659 Nonscarring hair loss, unspecified: Secondary | ICD-10-CM | POA: Diagnosis not present

## 2023-10-18 DIAGNOSIS — Z952 Presence of prosthetic heart valve: Secondary | ICD-10-CM | POA: Diagnosis not present

## 2023-10-24 DIAGNOSIS — R69 Illness, unspecified: Secondary | ICD-10-CM | POA: Diagnosis not present

## 2023-10-25 DIAGNOSIS — R69 Illness, unspecified: Secondary | ICD-10-CM | POA: Diagnosis not present

## 2023-10-31 DIAGNOSIS — R69 Illness, unspecified: Secondary | ICD-10-CM | POA: Diagnosis not present

## 2023-11-09 DIAGNOSIS — R69 Illness, unspecified: Secondary | ICD-10-CM | POA: Diagnosis not present

## 2023-11-09 DIAGNOSIS — Z952 Presence of prosthetic heart valve: Secondary | ICD-10-CM | POA: Diagnosis not present

## 2023-11-13 DIAGNOSIS — K760 Fatty (change of) liver, not elsewhere classified: Secondary | ICD-10-CM | POA: Diagnosis not present

## 2023-11-13 DIAGNOSIS — R1011 Right upper quadrant pain: Secondary | ICD-10-CM | POA: Diagnosis not present

## 2023-11-22 ENCOUNTER — Ambulatory Visit (HOSPITAL_COMMUNITY)
Admission: EM | Admit: 2023-11-22 | Discharge: 2023-11-22 | Disposition: A | Discharge status: Home / Self Care | Payer: Medicare HMO | Attending: Emergency Medicine | Admitting: Emergency Medicine

## 2023-11-22 ENCOUNTER — Encounter (HOSPITAL_COMMUNITY): Payer: Self-pay

## 2023-11-22 ENCOUNTER — Ambulatory Visit (INDEPENDENT_AMBULATORY_CARE_PROVIDER_SITE_OTHER): Payer: Medicare HMO

## 2023-11-22 DIAGNOSIS — J45901 Unspecified asthma with (acute) exacerbation: Secondary | ICD-10-CM | POA: Diagnosis not present

## 2023-11-22 DIAGNOSIS — R062 Wheezing: Secondary | ICD-10-CM | POA: Diagnosis not present

## 2023-11-22 DIAGNOSIS — R509 Fever, unspecified: Secondary | ICD-10-CM

## 2023-11-22 DIAGNOSIS — R6883 Chills (without fever): Secondary | ICD-10-CM | POA: Diagnosis not present

## 2023-11-22 DIAGNOSIS — R0602 Shortness of breath: Secondary | ICD-10-CM | POA: Diagnosis not present

## 2023-11-22 DIAGNOSIS — R911 Solitary pulmonary nodule: Secondary | ICD-10-CM | POA: Diagnosis not present

## 2023-11-22 LAB — POC COVID19/FLU A&B COMBO
Covid Antigen, POC: NEGATIVE
Influenza A Antigen, POC: NEGATIVE
Influenza B Antigen, POC: NEGATIVE

## 2023-11-22 MED ORDER — IPRATROPIUM-ALBUTEROL 0.5-2.5 (3) MG/3ML IN SOLN
3.0000 mL | Freq: Once | RESPIRATORY_TRACT | Status: AC
  Administered 2023-11-22: 3 mL via RESPIRATORY_TRACT

## 2023-11-22 MED ORDER — METHYLPREDNISOLONE ACETATE 80 MG/ML IJ SUSP
INTRAMUSCULAR | Status: AC
  Filled 2023-11-22: qty 1

## 2023-11-22 MED ORDER — IPRATROPIUM-ALBUTEROL 0.5-2.5 (3) MG/3ML IN SOLN
RESPIRATORY_TRACT | Status: AC
Start: 2023-11-22 — End: ?
  Filled 2023-11-22: qty 3

## 2023-11-22 MED ORDER — AMOXICILLIN-POT CLAVULANATE 875-125 MG PO TABS: 1.0000 | ORAL_TABLET | Freq: Two times a day (BID) | ORAL | 0 refills | Status: AC

## 2023-11-22 MED ORDER — FLUTICASONE PROPIONATE 50 MCG/ACT NA SUSP: 1.0000 | Freq: Every day | NASAL | 2 refills | Status: DC

## 2023-11-22 MED ORDER — METHYLPREDNISOLONE ACETATE 80 MG/ML IJ SUSP
60.0000 mg | Freq: Once | INTRAMUSCULAR | Status: AC
  Administered 2023-11-22: 60 mg via INTRAMUSCULAR

## 2023-11-22 MED ORDER — METHYLPREDNISOLONE 4 MG PO TBPK: ORAL_TABLET | ORAL | 0 refills | Status: DC

## 2023-11-22 MED ORDER — IPRATROPIUM-ALBUTEROL 0.5-2.5 (3) MG/3ML IN SOLN
RESPIRATORY_TRACT | Status: AC
  Filled 2023-11-22: qty 3

## 2023-11-22 MED ORDER — PROMETHAZINE-DM 6.25-15 MG/5ML PO SYRP: 5.0000 mL | ORAL_SOLUTION | Freq: Every evening | ORAL | 0 refills | Status: DC | PRN

## 2023-11-22 NOTE — ED Triage Notes (Signed)
 productive cough, chest congestion, SOB, chills x 1 day. Patient has asthma. Patient was sick first, her husband is being seen today as well. No known sick exposure but they did to to the [facility] 11/18/23.   Patient tried using her nebulizer, Corisedine night time with mild relief.

## 2023-11-22 NOTE — ED Provider Notes (Signed)
 [facility]    CSN: [id number] Arrival date & time: 11/22/23  1418    HISTORY   Chief Complaint  Patient presents with   Cough   HPI Shelly Huynh is a pleasant, 53 y.o. female who presents to urgent care today. Patient  complains of productive cough, chest congestion, tightness in her chest, shortness of breath and chills for the past 24 hours.  Patient reports a history of asthma, states she last used nebulized albuterol  last night.  Patient states she was sick first and now her husband is sick as well.  Patient denies known sick exposure but states that [place].  Patient states she is also been taking Coricidin with some mild relief of her symptoms of congestion.  Patient has SpO2 of 90% on arrival with elevated blood pressure, normal heart rate.  Patient has a temperature of 100.3 on arrival but per my observation is warmer than 100.3 to touch.  Patient has history of being prescribed Singulair , Xyzal  and Flonase  in the past for known asthma, is not currently taking any of these.  The history is provided by the patient.   Past Medical History:  Diagnosis Date   Anemia    Anxiety    a.) on BZO (diazepam ) PRN   Aortic atherosclerosis (HCC)    Aortic valve stenosis and insufficiency, rheumatic    a.) s/p AVR 04/03/2015 - 19 mm St. Jude mechanical valve   Arthritis    Asthma    ATN (acute tubular necrosis) (HCC)    Cardiomegaly    CHF (congestive heart failure) (HCC)    a.) TTE 08/13/2022: EF >55%, mod LAE, mild RV/RAE, triv-mild panvalvular regurgitation, G3DD.   Chronic anticoagulation    Chronic constipation    a.) on linaclotide    Chronic lower back pain    Cirrhosis (HCC)    Coronary artery calcification seen on CT scan    a.) LHC 12/05/2014: normal coronaries   Depression    DOE (dyspnea on exertion)    Emphysema of lung (HCC)    GERD (gastroesophageal reflux disease)    Helicobacter pylori infection    History of hiatal  hernia    History of methicillin resistant staphylococcus aureus (MRSA)    HTN (hypertension)    Hyperaldosteronism (HCC)    Hyperlipidemia    Hypertension    IBS (irritable bowel syndrome)    Long term current use of anticoagulant    a.) warfarin   Lumbar facet joint syndrome    Migraines    Mitral stenosis with insufficiency, rheumatic    a.) s/p MVR 04/03/2015 - 25 mm St. Jude mechanical valve   Moderate tricuspid regurgitation    a.) s/p TVR 04/03/2015 - 21 mm Mosaic porcine valve   NASH (nonalcoholic steatohepatitis)    Obesity    OSA on CPAP    Palpitations    Paroxysmal atrial flutter (HCC)    a.) CHA2DS2VASc = 5 (sex, CHF, HTN, vascular disease history, T2DM);  b.) rate/rhythm maintained without pharmacological intervention; chronically anticoagulated with warfarin   Sciatic pain    T2DM (type 2 diabetes mellitus) (HCC)    Umbilical hernia    a.) s/p repair 09/2004   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HCC)    a.) s/p aortic, mitral, and tricuspid valve replacements 04/03/2015   Vitamin D deficiency    Patient Active Problem List   Diagnosis Date Noted   Atrial flutter with rapid ventricular response (HCC) 09/27/2023   High  total IgG 08/29/2022   Lumbosacral L5-S1 facet arthritis (Severe) (Bilateral) 02/18/2020   Osteoarthritis of hips (Bilateral) 02/18/2020   Chronic prescription opiate use 01/16/2020   Neurogenic pain 01/16/2020   DDD (degenerative disc disease), lumbosacral 01/07/2020   History of Allergy to iodine 01/07/2020    Class: History of   History of allergy to radiographic contrast media 01/07/2020   H/O mechanical aortic valve replacement 12/16/2019   Chronic low back pain (1ry area of Pain) (Bilateral) (L>R) w/o sciatica 12/16/2019   Chronic lower extremity pain (2ry area of Pain) (Bilateral) (L>R) 12/16/2019   Chronic hip pain (3ry area of Pain) (Bilateral) (L>R) 12/16/2019   Chronic knee pain (4th area of Pain) (Bilateral) (L>R) 12/16/2019   Chronic  recurrent occipital headache (Bilateral) 12/16/2019   Cervicogenic headache (6th area of Pain) (Bilateral) 12/16/2019   Chronic neck pain (5th area of Pain) (Bilateral) (L>R) 12/16/2019   Chronic shoulder pain (Bilateral) (L>R) 12/16/2019   Chronic upper extremity pain (7th area of Pain) (Bilateral) (R>L) 12/16/2019   Chronic pain syndrome 12/16/2019   Pharmacologic therapy 12/16/2019   Disorder of skeletal system 12/16/2019   Problems influencing health status 12/16/2019   Abnormal MRI, cervical spine (09/06/2019) 12/16/2019   Cervical central spinal stenosis 12/16/2019   Cervical foraminal stenosis (C2-3, C4-5) (Left) 12/16/2019   Abnormal MRI, lumbar spine (08/24/2015) 12/16/2019   Lumbar facet hypertrophy (L4-5 and L5-S1) (Bilateral) 12/16/2019   Lumbar facet syndrome (Bilateral) (L>R) 12/16/2019   Cirrhosis of liver without ascites (HCC) 10/28/2019   Elevated hemoglobin A1c 10/28/2019   Lumbar radicular pain 10/28/2019   Bilateral hand numbness 10/22/2019   Cervicalgia 10/22/2019   Obesity, Class III, BMI 40-49.9 (morbid obesity) (HCC) 04/10/2019   Irritable bowel syndrome with constipation 12/26/2018   Status post bariatric surgery 09/12/2018   Hypertension due to endocrine disorder 08/17/2018   Hypertension, well controlled 08/14/2018   Hyperaldosteronism (HCC) 06/24/2018   Cervical radiculopathy 06/12/2018   Anxiety state 04/02/2018   HCAP (healthcare-associated pneumonia) 10/03/2017   Pleural effusion 10/02/2017   Chest pain on breathing 09/21/2017   Aortic valve disease, 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   Tricuspid valve disorder 09/21/2017   Mitral valve disease, 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   SOBOE (shortness of breath on exertion) 04/24/2017   Positive Helicobacter pylori serology 01/05/2017   Tobacco abuse 09/19/2016   On anticoagulant therapy 09/07/2016   Asthma 09/07/2016   Diabetes mellitus (HCC) 09/07/2016   GERD with esophagitis 09/07/2016   Hypertension 09/07/2016   Valvular  cardiomyopathy (HCC) 09/07/2016   Hypercholesterolemia 09/07/2016   Obstructive sleep apnea syndrome 09/07/2016   Bilateral leg edema 08/23/2016   Osteoarthritis of spine with radiculopathy, lumbar region 08/08/2016   Diabetes mellitus type 2, diet-controlled (HCC) 05/17/2016   Vitamin D deficiency 05/17/2016   Gastroesophageal reflux disease without esophagitis 10/12/2015   Spondylosis of lumbar region without myelopathy or radiculopathy 10/12/2015   Adiposity 06/30/2015   Compulsive tobacco user syndrome 06/30/2015   Nicotine dependence, uncomplicated 06/30/2015   Chronic hypokalemia 06/15/2015   Palpitations 05/18/2015   Chronic anticoagulation (Coumadin ) 05/07/2015   Benign essential HTN 05/05/2015   Absolute anemia 04/26/2015   Chronic combined systolic (congestive) and diastolic (congestive) heart failure (HCC) 04/08/2015   H/O aortic valve replacement 04/07/2015   H/O heart valve replacement with mechanical valve 04/07/2015   H/O prosthetic heart valve 04/07/2015   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 04/07/2015   Acute right heart failure (HCC) 04/04/2015   History of open heart surgery 04/03/2015   H/O mitral valve replacement with  mechanical valve 04/03/2015   H/O tricuspid valve replacement 04/03/2015   Presence of prosthetic heart valve 04/03/2015   History of tricuspid valve replacement with bioprosthetic valve 04/03/2015   Refusal of blood transfusions as patient is Jehovah's Witness 03/12/2015   Acute respiratory failure with hypoxemia (HCC) 03/05/2015   Coronary artery disease due to calcified coronary lesion 03/04/2015   Obstructive apnea 10/28/2014   Enlarged heart 10/28/2014   Combined fat and carbohydrate induced hyperlipemia 09/11/2014   Past Surgical History:  Procedure Laterality Date   ABDOMINAL HYSTERECTOMY  2010   AORTIC VALVE REPLACEMENT  04/03/2015   Procedure: AORTIC VALVE REPLACEMENT (19 mm St. Jude Regent mechanical valve); Location: [facility]; Surgeon:  Alm Schwab, MD   ARTERY BIOPSY N/A 10/03/2022   Procedure: BIOPSY TEMPORAL ARTERY;  Surgeon: Rodolph Romano, MD;  Location: [facility];  Service: General;  Laterality: N/A;   BREAST BIOPSY Left 07/25/2017   BENIGN BREAST TISSUE WITH MICROCALCIFICATIONS   BREAST BIOPSY Right 10/22/2019   Affirm Bx- X-clip- CSL, no atypia   BREAST BIOPSY Right 10/22/2019   Affirm Bx- Ribbon clip- CSL, no atypia   BREAST BIOPSY Right 11/20/2019   Procedure: BREAST BIOPSY WITH NEEDLE LOCALIZATION x 2;  Surgeon: Rodolph Romano, MD;  Location: [facility];  Service: General;  Laterality: Right;   BREAST EXCISIONAL BIOPSY Right 1992   NEG   BREAST LUMPECTOMY Right 11/20/2019   complex sclerosing lesion x 2   CESAREAN SECTION N/A 2000   CESAREAN SECTION N/A 1987   CHOLECYSTECTOMY  07/2020   COLONOSCOPY WITH PROPOFOL  N/A 09/21/2020   Procedure: COLONOSCOPY WITH PROPOFOL ;  Surgeon: Toledo, Ladell POUR, MD;  Location: [facility];  Service: Gastroenterology;  Laterality: N/A;   ESOPHAGOGASTRODUODENOSCOPY (EGD) WITH PROPOFOL  N/A 10/07/2016   Procedure: ESOPHAGOGASTRODUODENOSCOPY (EGD) WITH PROPOFOL ;  Surgeon: Ruel Kung, MD;  Location: [facility];  Service: Endoscopy;  Laterality: N/A;   ESOPHAGOGASTRODUODENOSCOPY (EGD) WITH PROPOFOL  N/A 09/21/2020   Procedure: ESOPHAGOGASTRODUODENOSCOPY (EGD) WITH PROPOFOL ;  Surgeon: Toledo, Ladell POUR, MD;  Location: [facility];  Service: Gastroenterology;  Laterality: N/A;   LAPAROSCOPIC GASTRIC BAND REMOVAL WITH LAPAROSCOPIC GASTRIC SLEEVE RESECTION     LEFT HEART CATH AND CORONARY ANGIOGRAPHY Left 12/05/2014   Procedure: LEFT HEART CATH AND CORONARY ANGIOGRAPHY; Location: [facility]; Surgeon: Wolm Rhyme, MD   LIPOSUCTION TRUNK  1996   MITRAL VALVE REPLACEMENT  04/03/2015   Procedure: MITRAL VALVE REPLACEMENT (25 mm St. Jude Mechanical valve); Location: [facility]; Surgeon: Alm Schwab, MD   TRICUSPID VALVE REPLACEMENT  04/03/2015   Procedure: TRICUSPID VALVE  REPLACEMENT (21 mm Mosaic porcine valve); Location: [facility]; Surgeon: Alm Schwab, MD   TUBAL LIGATION  2000   UMBILICAL HERNIA REPAIR  2005   VENTRAL HERNIA REPAIR     OB History   No obstetric history on file.    Home Medications    Prior to Admission medications   Medication Sig Start Date End Date Taking? Authorizing Provider  albuterol  (PROVENTIL ) (2.5 MG/3ML) 0.083% nebulizer solution Take 2.5 mg by nebulization 3 (three) times daily as needed for shortness of breath or wheezing. 08/08/23  Yes [provider]  albuterol  (VENTOLIN  HFA) 108 (90 Base) MCG/ACT inhaler Inhale 2 puffs into the lungs every 6 (six) hours as needed for wheezing or shortness of breath.   Yes [provider]  aspirin  EC 81 MG tablet Take 81 mg daily by mouth.   Yes [provider]  candesartan (ATACAND) 8 MG tablet Take 8 mg by mouth at bedtime.  12/07/19  Yes [provider]  cyclobenzaprine  (FLEXERIL ) 10 MG tablet Take 1 tablet by mouth 3 (three) times daily as needed. 04/10/19  Yes [provider]  fluticasone  (FLONASE ) 50 MCG/ACT nasal spray Place 2 sprays into both nostrils daily as needed for allergies or rhinitis.   Yes [provider]  iron  polysaccharides (NIFEREX) 150 MG capsule Take 150 mg by mouth daily.   Yes [provider]  lansoprazole (PREVACID) 30 MG capsule Take 30 mg by mouth daily.   Yes [provider]  levalbuterol (XOPENEX) 1.25 MG/3ML nebulizer solution Take 1.25 mg by nebulization every 8 (eight) hours as needed for shortness of breath or wheezing.   Yes [provider]  levocetirizine (XYZAL ) 5 MG tablet Take 1 tablet by mouth every evening. 08/23/23  Yes [provider]  linaclotide  (LINZESS ) 72 MCG capsule Take 72 mcg by mouth daily as needed.   Yes [provider]  memantine (NAMENDA) 5 MG tablet Take 5 mg by mouth 2 (two) times daily.   Yes [provider]  metoprolol  succinate  (TOPROL -XL) 25 MG 24 hr tablet Take 1 tablet (25 mg total) by mouth daily. 09/28/23  Yes Wieting, Richard, MD  montelukast  (SINGULAIR ) 10 MG tablet Take 1 tablet by mouth at bedtime. 08/23/23 08/22/24 Yes [provider]  OZEMPIC, 2 MG/DOSE, 8 MG/3ML SOPN Inject 2 mg into the skin once a week.   Yes [provider]  pravastatin  (PRAVACHOL ) 20 MG tablet Take 20 mg by mouth daily. 10/25/22  Yes [provider]  pregabalin  (LYRICA ) 75 MG capsule Take 75 mg by mouth 2 (two) times daily.   Yes [provider]  spironolactone  (ALDACTONE ) 25 MG tablet Take 1 tablet by mouth daily. 08/23/23  Yes [provider]  spironolactone  (ALDACTONE ) 50 MG tablet Take 50 mg by mouth daily.   Yes [provider]  Vitamin D, Ergocalciferol, (DRISDOL) 1.25 MG (50000 UNIT) CAPS capsule Take 50,000 Units by mouth once a week. sunday 06/10/22  Yes [provider]  warfarin (COUMADIN ) 6 MG tablet Take 6 mg by mouth daily. Take 7.5 mg on Sundays and 6 mg all other days   Yes [provider]  enoxaparin  (LOVENOX ) 120 MG/0.8ML injection Inject 120 mg into the skin 2 (two) times daily. Coumadin /lovenox  bridge for temporal artery biopsy surgery. Following calendar from Dr. Hester    [provider]    Family History Family History  Problem Relation Age of Onset   Thyroid  cancer Mother    Uterine cancer Mother    Breast cancer Paternal Grandmother    Lung cancer Paternal Grandmother    Pancreatic cancer Cousin    Social History Social History   Tobacco Use   Smoking status: Every Day    Current packs/day: 0.50    Average packs/day: 0.5 packs/day for 25.0 years (12.5 ttl pk-yrs)    Types: Cigarettes   Smokeless tobacco: Never  Vaping Use   Vaping status: Never Used  Substance Use Topics   Alcohol use: No   Drug use: No   Allergies   Ivp dye [iodinated contrast media], Sulfa antibiotics, Banana, Guaifenesin, Hctz [hydrochlorothiazide],  Keflex [cephalexin], Losartan, Povidone iodine, Zofran  [ondansetron ], Lisinopril, and Mucinex [guaifenesin er]  Review of Systems Review of Systems Pertinent findings revealed after performing a 14 point review of systems has been noted in the history of present illness.  Physical Exam Vital Signs BP (!) 156/89 (BP Location: Right Arm)   Pulse 82   Temp 100.3 F (37.9 C) (  Oral)   Resp 17   Ht 5' 3 (1.6 m)   Wt 233 lb (105.7 kg)   SpO2 97%   BMI 41.27 kg/m   No data found.  Physical Exam Vitals and nursing note reviewed.  Constitutional:      General: [name] not in acute distress.    Appearance: Normal appearance. [name] not ill-appearing.  HENT:     Head: Normocephalic and atraumatic.     Salivary Glands: Right salivary gland is not diffusely enlarged or tender. Left salivary gland is not diffusely enlarged or tender.     Right Ear: Ear canal and external ear normal. No drainage. A middle ear effusion is present. There is no impacted cerumen. Tympanic membrane is bulging. Tympanic membrane is not injected or erythematous.     Left Ear: Ear canal and external ear normal. No drainage. A middle ear effusion is present. There is no impacted cerumen. Tympanic membrane is bulging. Tympanic membrane is not injected or erythematous.     Ears:     Comments: Bilateral EACs normal, both TMs bulging with clear fluid    Nose: Rhinorrhea present. No nasal deformity, septal deviation, signs of injury, nasal tenderness, mucosal edema or congestion. Rhinorrhea is clear.     Right Nostril: Occlusion present. No foreign body, epistaxis or septal hematoma.     Left Nostril: Occlusion present. No foreign body, epistaxis or septal hematoma.     Right Turbinates: Enlarged, swollen and pale.     Left Turbinates: Enlarged, swollen and pale.     Right Sinus: No maxillary sinus tenderness or frontal sinus tenderness.     Left Sinus: No maxillary sinus tenderness or frontal sinus tenderness.      Mouth/Throat:     Lips: Pink. No lesions.     Mouth: Mucous membranes are moist. No oral lesions.     Pharynx: Oropharynx is clear. Uvula midline. No posterior oropharyngeal erythema or uvula swelling.     Tonsils: No tonsillar exudate. 0 on the right. 0 on the left.     Comments: Postnasal drip Eyes:     General: Lids are normal.        Right eye: No discharge.        Left eye: No discharge.     Extraocular Movements: Extraocular movements intact.     Conjunctiva/sclera: Conjunctivae normal.     Right eye: Right conjunctiva is not injected.     Left eye: Left conjunctiva is not injected.  Neck:     Trachea: Trachea and phonation normal.  Cardiovascular:     Rate and Rhythm: Normal rate and regular rhythm.     Pulses: Normal pulses.     Heart sounds: Normal heart sounds. No murmur heard.    No friction rub. No gallop.  Pulmonary:     Effort: Pulmonary effort is normal. No accessory muscle usage, prolonged expiration or respiratory distress.     Breath sounds: No stridor, decreased air movement or transmitted upper airway sounds. Examination of the right-upper field reveals decreased breath sounds and wheezing. Examination of the left-upper field reveals decreased breath sounds and wheezing. Examination of the right-middle field reveals wheezing. Examination of the left-middle field reveals wheezing. Examination of the right-lower field reveals wheezing. Examination of the left-lower field reveals wheezing. Decreased breath sounds and wheezing present. No rhonchi or rales.  Chest:     Chest wall: No tenderness.  Musculoskeletal:        General: Normal range of motion.  Cervical back: Normal range of motion and neck supple. Normal range of motion.  Lymphadenopathy:     Cervical: No cervical adenopathy.  Skin:    General: Skin is warm and dry.     Findings: No erythema or rash.  Neurological:     General: No focal deficit present.     Mental Status: [name] alert and oriented to  person, place, and time.  Psychiatric:        Mood and Affect: Mood normal.        Behavior: Behavior normal.     Visual Acuity Right Eye Distance:   Left Eye Distance:   Bilateral Distance:    Right Eye Near:   Left Eye Near:    Bilateral Near:     UC Couse / Diagnostics / Procedures:     Radiology No results found.  Procedures Procedures (including critical care time) EKG  Pending results:  Labs Reviewed  POC COVID19/FLU A&B COMBO - Normal    Medications Ordered in UC: Medications  ipratropium-albuterol  (DUONEB) 0.5-2.5 (3) MG/3ML nebulizer solution 3 mL (3 mLs Nebulization Given 11/22/23 1531)  methylPREDNISolone  acetate (DEPO-MEDROL ) injection 60 mg (60 mg Intramuscular Given 11/22/23 1531)  ipratropium-albuterol  (DUONEB) 0.5-2.5 (3) MG/3ML nebulizer solution 3 mL (3 mLs Nebulization Given 11/22/23 1547)    UC Diagnoses / Final Clinical Impressions(s)   I have reviewed the triage vital signs and the nursing notes.  Pertinent labs & imaging results that were available during my care of the patient were reviewed by me and considered in my medical decision making (see chart for details).    Final diagnoses:  Asthma with acute exacerbation, unspecified asthma severity, unspecified whether persistent  Fever, unspecified fever cause   Per dependent read, chest x-ray is concerning for acute asthma exacerbation, possible lower respiratory tract infection.  Patient will be treated with a 5-day course of Augmentin  for prophylaxis of pneumonia.  Patient had good response to DuoNeb treatments x 2 during her visit today.  Patient was also provided with Depo-Medrol  injection.  Patient has been advised to continue methylprednisolone  and to use her albuterol  4 times daily, either nebulized or HFA.  Patient provided with methixene DM for nighttime cough.  Conservative care recommended.  Return precautions advised.  Please see discharge instructions below for details of plan of care as  provided to patient. ED Prescriptions     Medication Sig Dispense Auth. Provider   amoxicillin -clavulanate (AUGMENTIN ) 875-125 MG tablet Take 1 tablet by mouth 2 (two) times daily for 5 days. 10 tablet Joesph Shaver Scales, PA-C   promethazine -dextromethorphan (PROMETHAZINE -DM) 6.25-15 MG/5ML syrup Take 5 mLs by mouth at bedtime as needed for cough. 60 mL Joesph Shaver Scales, PA-C   methylPREDNISolone  (MEDROL  DOSEPAK) 4 MG TBPK tablet Take 24 mg on day 1, 20 mg on day 2, 16 mg on day 3, 12 mg on day 4, 8 mg on day 5, 4 mg on day 6.  Take all tablets in each row at once, do not spread tablets out throughout the day. 21 tablet Joesph Shaver Scales, PA-C   fluticasone  (FLONASE ) 50 MCG/ACT nasal spray Place 1 spray into both nostrils daily. Begin by using 2 sprays in each nare daily for 3 to 5 days, then decrease to 1 spray in each nare daily. 15.8 mL Joesph Shaver Scales, PA-C      PDMP not reviewed this encounter.  Pending results:  Labs Reviewed  POC COVID19/FLU A&B COMBO - Normal      Discharge Instructions  Your chest x-ray is concerning for possible pneumonia.  I recommend that you begin taking antibiotic for treatment.  Your rapid influenza antigen test today was negative.  No further influenza testing is indicated.     Your COVID-19 test is negative.  Please consider retesting in the next 2 to 3 days, particularly if you are not feeling any better.  You are welcome to return here to urgent care to have it done or you can take a home COVID-19 test.     If both your COVID-19 tests are negative, then you can safely assume that your illness is due to one of the many less serious illnesses circulating in our community right now.     Conservative care is recommended with rest, drinking plenty of clear fluids, eating only when hungry, taking supportive medications for your symptoms and avoiding being around other people.  Please remain at home until you are fever free for 24  hours without the use of antifever medications such as Tylenol  and ibuprofen .     Please read below to learn more about the medications, dosages and frequencies that I recommend to help alleviate your symptoms and to get you feeling better soon:   Augmentin  (amoxicillin  - clavulanic acid):  Please take one (1) dose twice daily for 5 days.  This antibiotic can cause upset stomach, this will resolve once antibiotics are complete.  You are welcome to take a probiotic, eat yogurt, take Imodium while taking this medication.  Please avoid other systemic medications such as Maalox, Pepto-Bismol or milk of magnesia as they can interfere with the body's ability to absorb the antibiotics.    Depo-Medrol  IM (methylprednisolone ):  To quickly address your significant respiratory inflammation, you were provided with an injection of Solu-Medrol  in the office today.  You should continue to feel the full benefit of the steroid for the next 8 to 12 hours.  Medrol  Dosepak (methylprednisolone ): This is a steroid that will significantly calm your upper and lower airways.  Please take one row of tablets daily with your breakfast meal starting tomorrow morning until the prescription is complete.      Xyzal  (levocetirizine): This is an excellent second-generation antihistamine that helps to reduce respiratory inflammatory response to environmental allergens.  In some patients, this medication can cause daytime sleepiness so I recommend that you take 1 tablet daily at bedtime.     Singulair  (montelukast ): This is a mast cell stabilizer that works well with antihistamines.  Mast cells are responsible for stimulating histamine production.  Reducing the activity of mast cells decreases the amount of histamines will be produced.  This reduces upper and lower respiratory inflammation caused by allergy exposure.  I recommend that you take this medication at the same time you take your antihistamine.   Flonase  (fluticasone ): This is  a steroid nasal spray that used once daily, 1 spray in each nare.  This works best when used on a daily basis. This medication does not work well if it is only used when you think you need it.  After 3 to 5 days of use, you will notice significant reduction of the inflammation and mucus production that is currently being caused by exposure to allergens, whether seasonal or environmental.  The most common side effect of this medication is nosebleeds.  If you experience a nosebleed, please discontinue use for 1 week, then feel free to resume.  If you find that your insurance will not pay for this medication, please consider a different nasal  steroids such as Nasonex (mometasone), or Nasacort  (triamcinolone ).   Advil , Motrin  (ibuprofen ): This is a good anti-inflammatory medication which not only addresses aches, pains but also significantly reduces soft tissue inflammation of the upper airways that causes sinus and nasal congestion as well as inflammation of the lower airways which makes you feel like your breathing is constricted or your cough feel tight.  I recommend that you take 400 mg every 8 hours as needed.      Promethazine  DM: Promethazine  is both a nasal decongestant that dries up mucous membranes and an antinausea medication.  Promethazine  often makes most patients feel fairly sleepy.  DM is dextromethorphan, a single symptom reliever which is a cough suppressant found in many over-the-counter cough medications and combination cold preparations.  Please take 5 mL before bedtime to minimize your cough which will help you sleep better.  I have sent a prescription for this medication to your pharmacy because it cannot be purchased over-the-counter.   If symptoms have not meaningfully improved in the next 7 to 10 days, please return for repeat evaluation or follow-up with your regular provider.  If symptoms have worsened in the next 3 to 5 days, please return for repeat evaluation or follow-up with your  regular provider.    Thank you for visiting urgent care today.  We appreciate the opportunity to participate in your care.       Disposition Upon Discharge:  Condition: stable for discharge home  Patient presented with an acute illness with associated systemic symptoms and significant discomfort requiring urgent management. In my opinion, this is a condition that a prudent lay person (someone who possesses an average knowledge of health and medicine) may potentially expect to result in complications if not addressed urgently such as respiratory distress, impairment of bodily function or dysfunction of bodily organs.   Routine symptom specific, illness specific and/or disease specific instructions were discussed with the patient and/or caregiver at length.   As such, the patient has been evaluated and assessed, work-up was performed and treatment was provided in alignment with urgent care protocols and evidence based medicine.  Patient/parent/caregiver has been advised that the patient may require follow up for further testing and treatment if the symptoms continue in spite of treatment, as clinically indicated and appropriate.  Patient/parent/caregiver has been advised to return to the [facility] or PCP if no better; to PCP or the Emergency Department if new signs and symptoms develop, or if the current signs or symptoms continue to change or worsen for further workup, evaluation and treatment as clinically indicated and appropriate  The patient will follow up with their current PCP if and as advised. If the patient does not currently have a PCP we will assist them in obtaining one.   The patient may need specialty follow up if the symptoms continue, in spite of conservative treatment and management, for further workup, evaluation, consultation and treatment as clinically indicated and appropriate.  Patient/parent/caregiver verbalized understanding and agreement of plan as discussed.  All questions  were addressed during visit.  Please see discharge instructions below for further details of plan.  This office note has been dictated using Teaching laboratory technician.  Unfortunately, this method of dictation can sometimes lead to typographical or grammatical errors.  I apologize for your inconvenience in advance if this occurs.  Please do not hesitate to reach out to me if clarification is needed.      Joesph Shaver Scales, [place] 11/22/23 [phone]

## 2023-11-22 NOTE — Discharge Instructions (Addendum)
 Your chest x-ray is concerning for possible pneumonia.  I recommend that you begin taking antibiotic for treatment.  Your rapid influenza antigen test today was negative.  No further influenza testing is indicated.     Your COVID-19 test is negative.  Please consider retesting in the next 2 to 3 days, particularly if you are not feeling any better.  You are welcome to return here to urgent care to have it done or you can take a home COVID-19 test.     If both your COVID-19 tests are negative, then you can safely assume that your illness is due to one of the many less serious illnesses circulating in our community right now.     Conservative care is recommended with rest, drinking plenty of clear fluids, eating only when hungry, taking supportive medications for your symptoms and avoiding being around other people.  Please remain at home until you are fever free for 24 hours without the use of antifever medications such as Tylenol  and ibuprofen .     Please read below to learn more about the medications, dosages and frequencies that I recommend to help alleviate your symptoms and to get you feeling better soon:   Augmentin  (amoxicillin  - clavulanic acid):  Please take one (1) dose twice daily for 5 days.  This antibiotic can cause upset stomach, this will resolve once antibiotics are complete.  You are welcome to take a probiotic, eat yogurt, take Imodium while taking this medication.  Please avoid other systemic medications such as Maalox, Pepto-Bismol or milk of magnesia as they can interfere with the body's ability to absorb the antibiotics.    Depo-Medrol  IM (methylprednisolone ):  To quickly address your significant respiratory inflammation, you were provided with an injection of Solu-Medrol  in the office today.  You should continue to feel the full benefit of the steroid for the next 8 to 12 hours.  Medrol  Dosepak (methylprednisolone ): This is a steroid that will significantly calm your upper and  lower airways.  Please take one row of tablets daily with your breakfast meal starting tomorrow morning until the prescription is complete.      Xyzal  (levocetirizine): This is an excellent second-generation antihistamine that helps to reduce respiratory inflammatory response to environmental allergens.  In some patients, this medication can cause daytime sleepiness so I recommend that you take 1 tablet daily at bedtime.     Singulair  (montelukast ): This is a mast cell stabilizer that works well with antihistamines.  Mast cells are responsible for stimulating histamine production.  Reducing the activity of mast cells decreases the amount of histamines will be produced.  This reduces upper and lower respiratory inflammation caused by allergy exposure.  I recommend that you take this medication at the same time you take your antihistamine.   Flonase  (fluticasone ): This is a steroid nasal spray that used once daily, 1 spray in each nare.  This works best when used on a daily basis. This medication does not work well if it is only used when you think you need it.  After 3 to 5 days of use, you will notice significant reduction of the inflammation and mucus production that is currently being caused by exposure to allergens, whether seasonal or environmental.  The most common side effect of this medication is nosebleeds.  If you experience a nosebleed, please discontinue use for 1 week, then feel free to resume.  If you find that your insurance will not pay for this medication, please consider a different nasal steroids  such as Nasonex (mometasone), or Nasacort  (triamcinolone ).   Advil , Motrin  (ibuprofen ): This is a good anti-inflammatory medication which not only addresses aches, pains but also significantly reduces soft tissue inflammation of the upper airways that causes sinus and nasal congestion as well as inflammation of the lower airways which makes you feel like your breathing is constricted or your cough  feel tight.  I recommend that you take 400 mg every 8 hours as needed.      Promethazine  DM: Promethazine  is both a nasal decongestant that dries up mucous membranes and an antinausea medication.  Promethazine  often makes most patients feel fairly sleepy.  DM is dextromethorphan, a single symptom reliever which is a cough suppressant found in many over-the-counter cough medications and combination cold preparations.  Please take 5 mL before bedtime to minimize your cough which will help you sleep better.  I have sent a prescription for this medication to your pharmacy because it cannot be purchased over-the-counter.   If symptoms have not meaningfully improved in the next 7 to 10 days, please return for repeat evaluation or follow-up with your regular provider.  If symptoms have worsened in the next 3 to 5 days, please return for repeat evaluation or follow-up with your regular provider.    Thank you for visiting urgent care today.  We appreciate the opportunity to participate in your care.

## 2023-11-29 DIAGNOSIS — Z952 Presence of prosthetic heart valve: Secondary | ICD-10-CM | POA: Diagnosis not present

## 2023-12-06 DIAGNOSIS — Z6841 Body Mass Index (BMI) 40.0 and over, adult: Secondary | ICD-10-CM | POA: Diagnosis not present

## 2023-12-06 DIAGNOSIS — M4802 Spinal stenosis, cervical region: Secondary | ICD-10-CM | POA: Diagnosis not present

## 2023-12-06 DIAGNOSIS — G5601 Carpal tunnel syndrome, right upper limb: Secondary | ICD-10-CM | POA: Diagnosis not present

## 2023-12-06 DIAGNOSIS — M5412 Radiculopathy, cervical region: Secondary | ICD-10-CM | POA: Diagnosis not present

## 2023-12-06 DIAGNOSIS — G6289 Other specified polyneuropathies: Secondary | ICD-10-CM | POA: Diagnosis not present

## 2023-12-06 DIAGNOSIS — M47812 Spondylosis without myelopathy or radiculopathy, cervical region: Secondary | ICD-10-CM | POA: Diagnosis not present

## 2023-12-13 ENCOUNTER — Other Ambulatory Visit: Payer: Self-pay | Admitting: Student

## 2023-12-13 DIAGNOSIS — M47812 Spondylosis without myelopathy or radiculopathy, cervical region: Secondary | ICD-10-CM

## 2023-12-13 DIAGNOSIS — M5412 Radiculopathy, cervical region: Secondary | ICD-10-CM

## 2023-12-13 DIAGNOSIS — M654 Radial styloid tenosynovitis [de Quervain]: Secondary | ICD-10-CM | POA: Diagnosis not present

## 2023-12-13 DIAGNOSIS — Z952 Presence of prosthetic heart valve: Secondary | ICD-10-CM | POA: Diagnosis not present

## 2023-12-13 DIAGNOSIS — G5601 Carpal tunnel syndrome, right upper limb: Secondary | ICD-10-CM | POA: Diagnosis not present

## 2023-12-13 DIAGNOSIS — M4802 Spinal stenosis, cervical region: Secondary | ICD-10-CM

## 2023-12-14 DIAGNOSIS — D2371 Other benign neoplasm of skin of right lower limb, including hip: Secondary | ICD-10-CM | POA: Diagnosis not present

## 2023-12-14 DIAGNOSIS — D1801 Hemangioma of skin and subcutaneous tissue: Secondary | ICD-10-CM | POA: Diagnosis not present

## 2023-12-14 DIAGNOSIS — L918 Other hypertrophic disorders of the skin: Secondary | ICD-10-CM | POA: Diagnosis not present

## 2023-12-14 DIAGNOSIS — D2372 Other benign neoplasm of skin of left lower limb, including hip: Secondary | ICD-10-CM | POA: Diagnosis not present

## 2023-12-14 DIAGNOSIS — L658 Other specified nonscarring hair loss: Secondary | ICD-10-CM | POA: Diagnosis not present

## 2023-12-14 DIAGNOSIS — L659 Nonscarring hair loss, unspecified: Secondary | ICD-10-CM | POA: Diagnosis not present

## 2023-12-20 DIAGNOSIS — I4892 Unspecified atrial flutter: Secondary | ICD-10-CM | POA: Diagnosis not present

## 2023-12-21 ENCOUNTER — Encounter: Payer: Self-pay | Admitting: Student

## 2023-12-26 DIAGNOSIS — Z952 Presence of prosthetic heart valve: Secondary | ICD-10-CM | POA: Diagnosis not present

## 2023-12-26 DIAGNOSIS — Z8709 Personal history of other diseases of the respiratory system: Secondary | ICD-10-CM | POA: Diagnosis not present

## 2023-12-26 DIAGNOSIS — J101 Influenza due to other identified influenza virus with other respiratory manifestations: Secondary | ICD-10-CM | POA: Diagnosis not present

## 2023-12-26 DIAGNOSIS — E119 Type 2 diabetes mellitus without complications: Secondary | ICD-10-CM | POA: Diagnosis not present

## 2023-12-26 DIAGNOSIS — Z03818 Encounter for observation for suspected exposure to other biological agents ruled out: Secondary | ICD-10-CM | POA: Diagnosis not present

## 2023-12-26 DIAGNOSIS — Z7901 Long term (current) use of anticoagulants: Secondary | ICD-10-CM | POA: Diagnosis not present

## 2024-01-02 DIAGNOSIS — E119 Type 2 diabetes mellitus without complications: Secondary | ICD-10-CM | POA: Diagnosis not present

## 2024-01-02 DIAGNOSIS — R791 Abnormal coagulation profile: Secondary | ICD-10-CM | POA: Diagnosis not present

## 2024-01-02 DIAGNOSIS — K219 Gastro-esophageal reflux disease without esophagitis: Secondary | ICD-10-CM | POA: Diagnosis not present

## 2024-01-02 DIAGNOSIS — Z952 Presence of prosthetic heart valve: Secondary | ICD-10-CM | POA: Diagnosis not present

## 2024-01-02 DIAGNOSIS — Z0189 Encounter for other specified special examinations: Secondary | ICD-10-CM | POA: Diagnosis not present

## 2024-01-03 DIAGNOSIS — G8929 Other chronic pain: Secondary | ICD-10-CM | POA: Diagnosis not present

## 2024-01-03 DIAGNOSIS — M5442 Lumbago with sciatica, left side: Secondary | ICD-10-CM | POA: Diagnosis not present

## 2024-01-03 DIAGNOSIS — M47816 Spondylosis without myelopathy or radiculopathy, lumbar region: Secondary | ICD-10-CM | POA: Diagnosis not present

## 2024-01-03 DIAGNOSIS — M5416 Radiculopathy, lumbar region: Secondary | ICD-10-CM | POA: Diagnosis not present

## 2024-01-04 ENCOUNTER — Other Ambulatory Visit: Payer: Self-pay | Admitting: Internal Medicine

## 2024-01-04 DIAGNOSIS — Z0189 Encounter for other specified special examinations: Secondary | ICD-10-CM | POA: Diagnosis not present

## 2024-01-04 DIAGNOSIS — Z0000 Encounter for general adult medical examination without abnormal findings: Secondary | ICD-10-CM | POA: Diagnosis not present

## 2024-01-04 DIAGNOSIS — I700 Atherosclerosis of aorta: Secondary | ICD-10-CM | POA: Diagnosis not present

## 2024-01-04 DIAGNOSIS — Z6838 Body mass index (BMI) 38.0-38.9, adult: Secondary | ICD-10-CM | POA: Diagnosis not present

## 2024-01-04 DIAGNOSIS — E669 Obesity, unspecified: Secondary | ICD-10-CM | POA: Diagnosis not present

## 2024-01-04 DIAGNOSIS — G8929 Other chronic pain: Secondary | ICD-10-CM | POA: Diagnosis not present

## 2024-01-04 DIAGNOSIS — E119 Type 2 diabetes mellitus without complications: Secondary | ICD-10-CM | POA: Diagnosis not present

## 2024-01-04 DIAGNOSIS — F17210 Nicotine dependence, cigarettes, uncomplicated: Secondary | ICD-10-CM | POA: Diagnosis not present

## 2024-01-04 DIAGNOSIS — F32A Depression, unspecified: Secondary | ICD-10-CM | POA: Diagnosis not present

## 2024-01-04 DIAGNOSIS — M542 Cervicalgia: Secondary | ICD-10-CM | POA: Diagnosis not present

## 2024-01-04 DIAGNOSIS — Z952 Presence of prosthetic heart valve: Secondary | ICD-10-CM | POA: Diagnosis not present

## 2024-01-04 DIAGNOSIS — M5416 Radiculopathy, lumbar region: Secondary | ICD-10-CM | POA: Diagnosis not present

## 2024-01-04 DIAGNOSIS — Z1231 Encounter for screening mammogram for malignant neoplasm of breast: Secondary | ICD-10-CM

## 2024-01-04 DIAGNOSIS — Z1331 Encounter for screening for depression: Secondary | ICD-10-CM | POA: Diagnosis not present

## 2024-01-04 DIAGNOSIS — J439 Emphysema, unspecified: Secondary | ICD-10-CM | POA: Diagnosis not present

## 2024-01-04 DIAGNOSIS — I4892 Unspecified atrial flutter: Secondary | ICD-10-CM | POA: Diagnosis not present

## 2024-01-06 ENCOUNTER — Ambulatory Visit
Admission: RE | Admit: 2024-01-06 | Discharge: 2024-01-06 | Disposition: A | Discharge status: Home / Self Care | Payer: Medicare HMO | Source: Ambulatory Visit | Attending: Student | Admitting: Student

## 2024-01-06 DIAGNOSIS — M4802 Spinal stenosis, cervical region: Secondary | ICD-10-CM

## 2024-01-06 DIAGNOSIS — M5412 Radiculopathy, cervical region: Secondary | ICD-10-CM | POA: Diagnosis not present

## 2024-01-06 DIAGNOSIS — M47812 Spondylosis without myelopathy or radiculopathy, cervical region: Secondary | ICD-10-CM

## 2024-01-09 DIAGNOSIS — Z952 Presence of prosthetic heart valve: Secondary | ICD-10-CM | POA: Diagnosis not present

## 2024-01-17 DIAGNOSIS — R911 Solitary pulmonary nodule: Secondary | ICD-10-CM | POA: Diagnosis not present

## 2024-01-17 DIAGNOSIS — J432 Centrilobular emphysema: Secondary | ICD-10-CM | POA: Diagnosis not present

## 2024-01-17 DIAGNOSIS — Z122 Encounter for screening for malignant neoplasm of respiratory organs: Secondary | ICD-10-CM | POA: Diagnosis not present

## 2024-01-17 DIAGNOSIS — F17210 Nicotine dependence, cigarettes, uncomplicated: Secondary | ICD-10-CM | POA: Diagnosis not present

## 2024-01-17 DIAGNOSIS — R8290 Unspecified abnormal findings in urine: Secondary | ICD-10-CM | POA: Diagnosis not present

## 2024-01-17 DIAGNOSIS — Z952 Presence of prosthetic heart valve: Secondary | ICD-10-CM | POA: Diagnosis not present

## 2024-01-19 DIAGNOSIS — M542 Cervicalgia: Secondary | ICD-10-CM | POA: Diagnosis not present

## 2024-01-20 DIAGNOSIS — T8859XA Other complications of anesthesia, initial encounter: Secondary | ICD-10-CM

## 2024-01-20 HISTORY — DX: Other complications of anesthesia, initial encounter: T88.59XA

## 2024-01-26 DIAGNOSIS — Z0184 Encounter for antibody response examination: Secondary | ICD-10-CM | POA: Diagnosis not present

## 2024-01-31 ENCOUNTER — Other Ambulatory Visit: Payer: Self-pay | Admitting: Student

## 2024-01-31 DIAGNOSIS — G8929 Other chronic pain: Secondary | ICD-10-CM

## 2024-01-31 DIAGNOSIS — M47816 Spondylosis without myelopathy or radiculopathy, lumbar region: Secondary | ICD-10-CM

## 2024-01-31 DIAGNOSIS — M5416 Radiculopathy, lumbar region: Secondary | ICD-10-CM

## 2024-01-31 DIAGNOSIS — M4807 Spinal stenosis, lumbosacral region: Secondary | ICD-10-CM

## 2024-02-02 DIAGNOSIS — Z952 Presence of prosthetic heart valve: Secondary | ICD-10-CM | POA: Diagnosis not present

## 2024-02-02 DIAGNOSIS — M542 Cervicalgia: Secondary | ICD-10-CM | POA: Diagnosis not present

## 2024-02-04 ENCOUNTER — Ambulatory Visit
Admission: RE | Admit: 2024-02-04 | Discharge: 2024-02-04 | Disposition: A | Discharge status: Home / Self Care | Source: Ambulatory Visit | Attending: Student | Admitting: Student

## 2024-02-04 DIAGNOSIS — M4807 Spinal stenosis, lumbosacral region: Secondary | ICD-10-CM | POA: Diagnosis not present

## 2024-02-04 DIAGNOSIS — M48061 Spinal stenosis, lumbar region without neurogenic claudication: Secondary | ICD-10-CM | POA: Diagnosis not present

## 2024-02-04 DIAGNOSIS — M5416 Radiculopathy, lumbar region: Secondary | ICD-10-CM | POA: Insufficient documentation

## 2024-02-04 DIAGNOSIS — M47816 Spondylosis without myelopathy or radiculopathy, lumbar region: Secondary | ICD-10-CM | POA: Insufficient documentation

## 2024-02-04 DIAGNOSIS — M5136 Other intervertebral disc degeneration, lumbar region with discogenic back pain only: Secondary | ICD-10-CM | POA: Diagnosis not present

## 2024-02-04 DIAGNOSIS — M79604 Pain in right leg: Secondary | ICD-10-CM | POA: Diagnosis not present

## 2024-02-04 DIAGNOSIS — M5442 Lumbago with sciatica, left side: Secondary | ICD-10-CM | POA: Insufficient documentation

## 2024-02-04 DIAGNOSIS — G8929 Other chronic pain: Secondary | ICD-10-CM | POA: Insufficient documentation

## 2024-02-06 DIAGNOSIS — I10 Essential (primary) hypertension: Secondary | ICD-10-CM | POA: Diagnosis not present

## 2024-02-06 DIAGNOSIS — E119 Type 2 diabetes mellitus without complications: Secondary | ICD-10-CM | POA: Diagnosis not present

## 2024-02-06 DIAGNOSIS — I2510 Atherosclerotic heart disease of native coronary artery without angina pectoris: Secondary | ICD-10-CM | POA: Diagnosis not present

## 2024-02-06 DIAGNOSIS — Z952 Presence of prosthetic heart valve: Secondary | ICD-10-CM | POA: Diagnosis not present

## 2024-02-06 DIAGNOSIS — G4733 Obstructive sleep apnea (adult) (pediatric): Secondary | ICD-10-CM | POA: Diagnosis not present

## 2024-02-06 DIAGNOSIS — I4892 Unspecified atrial flutter: Secondary | ICD-10-CM | POA: Diagnosis not present

## 2024-02-06 DIAGNOSIS — I2584 Coronary atherosclerosis due to calcified coronary lesion: Secondary | ICD-10-CM | POA: Diagnosis not present

## 2024-02-08 DIAGNOSIS — Z952 Presence of prosthetic heart valve: Secondary | ICD-10-CM | POA: Diagnosis not present

## 2024-02-08 DIAGNOSIS — M5380 Other specified dorsopathies, site unspecified: Secondary | ICD-10-CM | POA: Diagnosis not present

## 2024-02-08 DIAGNOSIS — Z7901 Long term (current) use of anticoagulants: Secondary | ICD-10-CM | POA: Diagnosis not present

## 2024-02-08 DIAGNOSIS — I10 Essential (primary) hypertension: Secondary | ICD-10-CM | POA: Diagnosis not present

## 2024-02-08 DIAGNOSIS — I2584 Coronary atherosclerosis due to calcified coronary lesion: Secondary | ICD-10-CM | POA: Diagnosis not present

## 2024-02-08 DIAGNOSIS — M62830 Muscle spasm of back: Secondary | ICD-10-CM | POA: Diagnosis not present

## 2024-02-08 DIAGNOSIS — M62838 Other muscle spasm: Secondary | ICD-10-CM | POA: Diagnosis not present

## 2024-02-08 DIAGNOSIS — I2510 Atherosclerotic heart disease of native coronary artery without angina pectoris: Secondary | ICD-10-CM | POA: Diagnosis not present

## 2024-02-08 DIAGNOSIS — I4892 Unspecified atrial flutter: Secondary | ICD-10-CM | POA: Diagnosis not present

## 2024-02-12 DIAGNOSIS — I4892 Unspecified atrial flutter: Secondary | ICD-10-CM | POA: Diagnosis not present

## 2024-02-12 DIAGNOSIS — G4733 Obstructive sleep apnea (adult) (pediatric): Secondary | ICD-10-CM | POA: Diagnosis not present

## 2024-02-12 DIAGNOSIS — Z952 Presence of prosthetic heart valve: Secondary | ICD-10-CM | POA: Diagnosis not present

## 2024-02-12 DIAGNOSIS — Z7901 Long term (current) use of anticoagulants: Secondary | ICD-10-CM | POA: Diagnosis not present

## 2024-02-12 DIAGNOSIS — I152 Hypertension secondary to endocrine disorders: Secondary | ICD-10-CM | POA: Diagnosis not present

## 2024-02-12 DIAGNOSIS — I10 Essential (primary) hypertension: Secondary | ICD-10-CM | POA: Diagnosis not present

## 2024-02-13 DIAGNOSIS — I2510 Atherosclerotic heart disease of native coronary artery without angina pectoris: Secondary | ICD-10-CM | POA: Diagnosis not present

## 2024-02-13 DIAGNOSIS — G4733 Obstructive sleep apnea (adult) (pediatric): Secondary | ICD-10-CM | POA: Diagnosis not present

## 2024-02-13 DIAGNOSIS — I4892 Unspecified atrial flutter: Secondary | ICD-10-CM | POA: Diagnosis not present

## 2024-02-13 DIAGNOSIS — E119 Type 2 diabetes mellitus without complications: Secondary | ICD-10-CM | POA: Diagnosis not present

## 2024-02-13 DIAGNOSIS — Z952 Presence of prosthetic heart valve: Secondary | ICD-10-CM | POA: Diagnosis not present

## 2024-02-13 DIAGNOSIS — I471 Supraventricular tachycardia, unspecified: Secondary | ICD-10-CM | POA: Diagnosis not present

## 2024-02-13 DIAGNOSIS — K760 Fatty (change of) liver, not elsewhere classified: Secondary | ICD-10-CM | POA: Diagnosis not present

## 2024-02-13 DIAGNOSIS — I4719 Other supraventricular tachycardia: Secondary | ICD-10-CM | POA: Diagnosis not present

## 2024-02-13 DIAGNOSIS — Z7984 Long term (current) use of oral hypoglycemic drugs: Secondary | ICD-10-CM | POA: Diagnosis not present

## 2024-02-13 DIAGNOSIS — I10 Essential (primary) hypertension: Secondary | ICD-10-CM | POA: Diagnosis not present

## 2024-02-13 DIAGNOSIS — I272 Pulmonary hypertension, unspecified: Secondary | ICD-10-CM | POA: Diagnosis not present

## 2024-02-22 DIAGNOSIS — K760 Fatty (change of) liver, not elsewhere classified: Secondary | ICD-10-CM | POA: Diagnosis not present

## 2024-02-22 DIAGNOSIS — R1011 Right upper quadrant pain: Secondary | ICD-10-CM | POA: Diagnosis not present

## 2024-02-27 DIAGNOSIS — Z952 Presence of prosthetic heart valve: Secondary | ICD-10-CM | POA: Diagnosis not present

## 2024-02-29 DIAGNOSIS — F32A Depression, unspecified: Secondary | ICD-10-CM | POA: Diagnosis not present

## 2024-02-29 DIAGNOSIS — K7460 Unspecified cirrhosis of liver: Secondary | ICD-10-CM | POA: Diagnosis not present

## 2024-02-29 DIAGNOSIS — Z0000 Encounter for general adult medical examination without abnormal findings: Secondary | ICD-10-CM | POA: Diagnosis not present

## 2024-02-29 DIAGNOSIS — Z952 Presence of prosthetic heart valve: Secondary | ICD-10-CM | POA: Diagnosis not present

## 2024-02-29 DIAGNOSIS — Z6838 Body mass index (BMI) 38.0-38.9, adult: Secondary | ICD-10-CM | POA: Diagnosis not present

## 2024-02-29 DIAGNOSIS — Z7901 Long term (current) use of anticoagulants: Secondary | ICD-10-CM | POA: Diagnosis not present

## 2024-02-29 DIAGNOSIS — E669 Obesity, unspecified: Secondary | ICD-10-CM | POA: Diagnosis not present

## 2024-02-29 DIAGNOSIS — F17210 Nicotine dependence, cigarettes, uncomplicated: Secondary | ICD-10-CM | POA: Diagnosis not present

## 2024-03-12 DIAGNOSIS — I2584 Coronary atherosclerosis due to calcified coronary lesion: Secondary | ICD-10-CM | POA: Diagnosis not present

## 2024-03-12 DIAGNOSIS — I2510 Atherosclerotic heart disease of native coronary artery without angina pectoris: Secondary | ICD-10-CM | POA: Diagnosis not present

## 2024-03-13 ENCOUNTER — Other Ambulatory Visit: Payer: Self-pay | Admitting: Surgery

## 2024-03-15 DIAGNOSIS — Z952 Presence of prosthetic heart valve: Secondary | ICD-10-CM | POA: Diagnosis not present

## 2024-03-15 DIAGNOSIS — G5601 Carpal tunnel syndrome, right upper limb: Secondary | ICD-10-CM | POA: Diagnosis not present

## 2024-03-19 ENCOUNTER — Ambulatory Visit
Admission: RE | Admit: 2024-03-19 | Discharge: 2024-03-19 | Disposition: A | Discharge status: Home / Self Care | Source: Ambulatory Visit | Attending: Internal Medicine | Admitting: Internal Medicine

## 2024-03-19 ENCOUNTER — Encounter

## 2024-03-19 DIAGNOSIS — Z1231 Encounter for screening mammogram for malignant neoplasm of breast: Secondary | ICD-10-CM | POA: Diagnosis not present

## 2024-03-20 ENCOUNTER — Encounter
Admission: RE | Admit: 2024-03-20 | Discharge: 2024-03-20 | Disposition: A | Discharge status: Home / Self Care | Source: Ambulatory Visit | Attending: Surgery | Admitting: Surgery

## 2024-03-20 ENCOUNTER — Other Ambulatory Visit: Payer: Self-pay

## 2024-03-20 DIAGNOSIS — Z7901 Long term (current) use of anticoagulants: Secondary | ICD-10-CM

## 2024-03-20 DIAGNOSIS — I4892 Unspecified atrial flutter: Secondary | ICD-10-CM

## 2024-03-20 DIAGNOSIS — Z953 Presence of xenogenic heart valve: Secondary | ICD-10-CM

## 2024-03-20 DIAGNOSIS — E119 Type 2 diabetes mellitus without complications: Secondary | ICD-10-CM

## 2024-03-20 NOTE — Patient Instructions (Addendum)
 Your procedure is scheduled on: 03/27/24 - Wednesday Report to the Registration Desk on the 1st floor of the [facility]. To find out your arrival time, please call [phone] between 1PM - 3PM on: 03/26/24 - Tuesday If your arrival time is 6:00 am, do not arrive before that time as the [facility] entrance doors do not open until 6:00 am.  REMEMBER: Instructions that are not followed completely may result in serious medical risk, up to and including death; or upon the discretion of your surgeon and anesthesiologist your surgery may need to be rescheduled.  Do not eat food after midnight the night before surgery.  No gum chewing or hard candies.  You may however, drink CLEAR liquids up to 2 hours before you are scheduled to arrive for your surgery. Do not drink anything within 2 hours of your scheduled arrival time.  Clear liquids include: - water   In addition, your doctor has ordered for you to drink the provided:  Gatorade G2 Drinking this carbohydrate drink up to two hours before surgery helps to reduce insulin  resistance and improve patient outcomes. Please complete drinking 2 hours before scheduled arrival time.  One week prior to surgery: Stop Anti-inflammatories (NSAIDS) such as Advil, Aleve, Ibuprofen, Motrin, Naproxen, Naprosyn and Aspirin  based products such as Excedrin, Goody's Powder, BC Powder. You may take Tylenol  if needed for pain up until the day of surgery.  Stop ANY OVER THE COUNTER supplements until after surgery : CRANBERRY   HOLD OZEMPIC  7 days prior to your procedure.  FOLLOW warfarin (COUMADIN ) instructions given to you by your Doctor.  ON THE DAY OF SURGERY ONLY TAKE THESE MEDICATIONS WITH SIPS OF WATER:  albuterol  (PROVENTIL ) neb lansoprazole (PREVACID)  pregabalin  (LYRICA )    No Alcohol for 24 hours before or after surgery.  No Smoking including e-cigarettes for 24 hours before surgery.  No chewable tobacco products for at least 6 hours before  surgery.  No nicotine patches on the day of surgery.  Do not use any "recreational" drugs for at least a week (preferably 2 weeks) before your surgery.  Please be advised that the combination of cocaine and anesthesia may have negative outcomes, up to and including death. If you test positive for cocaine, your surgery will be cancelled.  On the morning of surgery brush your teeth with toothpaste and water, you may rinse your mouth with mouthwash if you wish. Do not swallow any toothpaste or mouthwash.  Use CHG Soap or wipes as directed on instruction sheet.  Do not wear jewelry, make-up, hairpins, clips or nail polish.  For welded (permanent) jewelry: bracelets, anklets, waist bands, etc.  Please have this removed prior to surgery.  If it is not removed, there is a chance that hospital personnel will need to cut it off on the day of surgery.  Do not wear lotions, powders, or perfumes.   Do not shave body hair from the neck down 48 hours before surgery.  Contact lenses, hearing aids and dentures may not be worn into surgery.  Do not bring valuables to the hospital. [facility] is not responsible for any missing/lost belongings or valuables.   Bring your C-PAP to the hospital in case you may have to spend the night.   Notify your doctor if there is any change in your medical condition (cold, fever, infection).  Wear comfortable clothing (specific to your surgery type) to the hospital.  After surgery, you can help prevent lung complications by doing breathing exercises.  Take deep breaths and cough every 1-2 hours. Your doctor may order a device called an Incentive Spirometer to help you take deep breaths.  When coughing or sneezing, hold a pillow firmly against your incision with both hands. This is called "splinting." Doing this helps protect your incision. It also decreases belly discomfort.  If you are being admitted to the hospital overnight, leave your suitcase in the car. After  surgery it may be brought to your room.  In case of increased patient census, it may be necessary for you, the patient, to continue your postoperative care in the Same Day Surgery department.  If you are being discharged the day of surgery, you will not be allowed to drive home. You will need a responsible individual to drive you home and stay with you for 24 hours after surgery.   If you are taking public transportation, you will need to have a responsible individual with you.  Please call the Pre-admissions Testing Dept. at [phone] if you have any questions about these instructions.  Surgery Visitation Policy:  Patients having surgery or a procedure may have two visitors.  Children under the age of 27 must have an adult with them who is not the patient.  Inpatient Visitation:    Visiting hours are 7 a.m. to 8 p.m. Up to four visitors are allowed at one time in a patient room. The visitors may rotate out with other people during the day.  One visitor age 22 or older may stay with the patient overnight and must be in the room by 8 p.m.     Preparing for Surgery with CHLORHEXIDINE  GLUCONATE (CHG) Soap  Chlorhexidine  Gluconate (CHG) Soap  o An antiseptic cleaner that kills germs and bonds with the skin to continue killing germs even after washing  o Used for showering the night before surgery and morning of surgery  Before surgery, you can play an important role by reducing the number of germs on your skin.  CHG (Chlorhexidine  gluconate) soap is an antiseptic cleanser which kills germs and bonds with the skin to continue killing germs even after washing.  Please do not use if you have an allergy to CHG or antibacterial soaps. If your skin becomes reddened/irritated stop using the CHG.  1. Shower the NIGHT BEFORE SURGERY and the MORNING OF SURGERY with CHG soap.  2. If you choose to wash your hair, wash your hair first as usual with your normal shampoo.  3. After  shampooing, rinse your hair and body thoroughly to remove the shampoo.  4. Use CHG as you would any other liquid soap. You can apply CHG directly to the skin and wash gently with a scrungie or a clean washcloth.  5. Apply the CHG soap to your body only from the neck down. Do not use on open wounds or open sores. Avoid contact with your eyes, ears, mouth, and genitals (private parts). Wash face and genitals (private parts) with your normal soap.  6. Wash thoroughly, paying special attention to the area where your surgery will be performed.  7. Thoroughly rinse your body with warm water.  8. Do not shower/wash with your normal soap after using and rinsing off the CHG soap.  9. Pat yourself dry with a clean towel.  10. Wear clean pajamas to bed the night before surgery.  12. Place clean sheets on your bed the night of your first shower and do not sleep with pets.  13. Shower again with the CHG soap  on the day of surgery prior to arriving at the hospital.  14. Do not apply any deodorants/lotions/powders.  15. Please wear clean clothes to the hospital.   How to Use an Incentive Spirometer  An incentive spirometer is a tool that measures how well you are filling your lungs with each breath. Learning to take long, deep breaths using this tool can help you keep your lungs clear and active. This may help to reverse or lessen your chance of developing breathing (pulmonary) problems, especially infection. You may be asked to use a spirometer: After a surgery. If you have a lung problem or a history of smoking. After a long period of time when you have been unable to move or be active. If the spirometer includes an indicator to show the highest number that you have reached, your health care provider or respiratory therapist will help you set a goal. Keep a log of your progress as told by your health care provider. What are the risks? Breathing too quickly may cause dizziness or cause you to pass  out. Take your time so you do not get dizzy or light-headed. If you are in pain, you may need to take pain medicine before doing incentive spirometry. It is harder to take a deep breath if you are having pain. How to use your incentive spirometer  Sit up on the edge of your bed or on a chair. Hold the incentive spirometer so that it is in an upright position. Before you use the spirometer, breathe out normally. Place the mouthpiece in your mouth. Make sure your lips are closed tightly around it. Breathe in slowly and as deeply as you can through your mouth, causing the piston or the ball to rise toward the top of the chamber. Hold your breath for 3-5 seconds, or for as long as possible. If the spirometer includes a coach indicator, use this to guide you in breathing. Slow down your breathing if the indicator goes above the marked areas. Remove the mouthpiece from your mouth and breathe out normally. The piston or ball will return to the bottom of the chamber. Rest for a few seconds, then repeat the steps 10 or more times. Take your time and take a few normal breaths between deep breaths so that you do not get dizzy or light-headed. Do this every 1-2 hours when you are awake. If the spirometer includes a goal marker to show the highest number you have reached (best effort), use this as a goal to work toward during each repetition. After each set of 10 deep breaths, cough a few times. This will help to make sure that your lungs are clear. If you have an incision on your chest or abdomen from surgery, place a pillow or a rolled-up towel firmly against the incision when you cough. This can help to reduce pain while taking deep breaths and coughing. General tips When you are able to get out of bed: Walk around often. Continue to take deep breaths and cough in order to clear your lungs. Keep using the incentive spirometer until your health care provider says it is okay to stop using it. If you have  been in the hospital, you may be told to keep using the spirometer at home. Contact a health care provider if: You are having difficulty using the spirometer. You have trouble using the spirometer as often as instructed. Your pain medicine is not giving enough relief for you to use the spirometer as told. You have  a fever. Get help right away if: You develop shortness of breath. You develop a cough with bloody mucus from the lungs. You have fluid or blood coming from an incision site after you cough. Summary An incentive spirometer is a tool that can help you learn to take long, deep breaths to keep your lungs clear and active. You may be asked to use a spirometer after a surgery, if you have a lung problem or a history of smoking, or if you have been inactive for a long period of time. Use your incentive spirometer as instructed every 1-2 hours while you are awake. If you have an incision on your chest or abdomen, place a pillow or a rolled-up towel firmly against your incision when you cough. This will help to reduce pain. Get help right away if you have shortness of breath, you cough up bloody mucus, or blood comes from your incision when you cough. This information is not intended to replace advice given to you by your health care provider. Make sure you discuss any questions you have with your health care provider. Document Revised: 01/27/2020 Document Reviewed: 01/27/2020 Elsevier Patient Education  2023 [employer].

## 2024-03-22 ENCOUNTER — Encounter: Payer: Self-pay | Admitting: Surgery

## 2024-03-22 NOTE — Progress Notes (Incomplete)
 Perioperative / Anesthesia Services  Pre-Admission Testing Clinical Review / Pre-Operative Anesthesia Consult  Date: 03/22/24  Patient Demographics:  Name: Shelly Huynh DOB: [birth year] MRN:   [id number]  Planned Surgical Procedure(s):    Case: [id number] Date/Time: 03/27/24 1043   Procedure: RELEASE, CARPAL TUNNEL, ENDOSCOPIC (Right: Wrist)   Anesthesia type: Choice   Diagnosis: Carpal tunnel syndrome, right [G56.01]   Pre-op diagnosis: CARPAL TUNNEL SYNDROME, RIGHT   Location: [facility] OR ROOM 02 / [facility]   Surgeons: Elner Hahn, MD      NOTE: Available PAT nursing documentation and vital signs have been reviewed. Clinical nursing staff has updated patient's PMH/PSHx, current medication list, and drug allergies/intolerances to ensure comprehensive history available to assist in medical decision making as it pertains to the aforementioned surgical procedure and anticipated anesthetic course. Extensive review of available clinical information personally performed. Shelly Huynh PMH and PSHx updated with any diagnoses/procedures that  may have been inadvertently omitted during his intake with the pre-admission testing department's nursing staff.  Clinical Discussion:  Shelly Huynh is a 53 y.o. female who is submitted for pre-surgical anesthesia review and clearance prior to her undergoing the above procedure.  Patient is a Current Smoker (12.5 pack years). Pertinent PMH includes: CAD, HFpEF, valvular cardiomyopathy (s/p AVR/MVR/TVR), cardiomegaly, paroxysmal atrial flutter, palpitations, aortic atherosclerosis, HTN, HLD, T2DM, DOE, asthma, emphysema, GERD (no daily Tx), hiatal hernia, OSAH (requires nocturnal PAP therapy), anemia, hyperaldosteronism, cirrhosis/NASH, recurrent headaches, cervical DDD, lumbar facet syndrome, chronic lower back pain with sciatica, OA, carpal tunnel syndrome, depression, anxiety (on BZO), depression, mild cognitive impairment (? early  dementia).   Patient is followed by cardiology Bob Burn, MD). She was last seen in the cardiology clinic on 02/08/2024; notes reviewed. At the time of her clinic visit, patient doing well overall from a cardiovascular perspective.  Patient reported occasional episodes of sharp RIGHT-sided chest discomfort lasting for a few seconds.  Symptom was reported to be self-limiting.  Patient denied any chest pain, shortness of breath, PND, orthopnea, palpitations, significant peripheral edema, weakness, fatigue, vertiginous symptoms, or presyncope/syncope. Patient with a past medical history significant for cardiovascular diagnoses. Documented physical exam was grossly benign, providing no evidence of acute exacerbation and/or decompensation of the patient's known cardiovascular conditions.  Diagnostic LEFT heart catheterization performed on 12/05/2014 here at [facility] revealed normal left ventricular systolic function.  There was normal coronary anatomy with no evidence of obstructive CAD.   Patient has a history of valvular cardiomyopathy.  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  Patient ultimately underwent replacement of her aortic, mitral, and tricuspid valves on 04/03/2015.   19 mm St. Jude mechanical aortic valve 25 mm St. Jude mechanical mitral valve 21 mm Mosaic porcine tricuspid valve   Most recent myocardial perfusion imaging study was performed on 04/13/2022 revealing a normal left ventricular systolic function with an EF of 64%.  There was normal regional wall motion.  There was no evidence of stress-induced myocardial ischemia or arrhythmia; no scintigraphic evidence of scar.  Study determined to be normal and low risk overall.  Most recent TTE performed on 03/12/2024 revealed a normal left ventricular systolic function with an EF of greater than 55%. There was moderate LVH.  There were no regional  wall motion abnormalities.  Left ventricular diastolic Doppler parameters were indeterminate.  Right ventricular size and function normal with a TAPSE measuring 1.5 cm  (normal range >/= 1.6 cm).  RVSP = 25 mmHg. There was trivial to mild mitral, pulmonic and tricuspid valve regurgitation.bioprosthetic mitral, aortic, and tricuspid valves noted to be well-seated and functioning properly.  Mean transvalvular mitral valve gradient = 3 mmHg.  Mean transaortic valve gradient = 27 mmHg; peak velocity = 3.3 m/s, DI = 0.33).  Mean tricuspid transvalvular gradient = 4 mmHg. Aorta normal in size with no evidence of ectasia or aneurysmal dilatation.  Following valve replacements, patient remains on daily oral anticoagulation therapy using warfarin.  Patient with an atrial fibrillation diagnosis; CHA2DS2-VASc Score = 5 (sex, HFpEF, HTN, vascular disease, T2DM). Patient underwent radiofrequency ablation procedure on 02/13/2024 at [facility].  Post procedurally, cardiac rate/rhythm is being maintained on oral metoprolol  succinate.  Given her recent ablation and following valve replacements, patient remains on daily oral anticoagulation therapy using warfarin.  Patient reported be compliant with therapy with no evidence or reports of GI/GU related bleeding. Blood pressure well controlled at 132/76 mmHg on currently prescribed ARB (candesartan), beta-blocker (metoprolol  succinate), and diuretic (spironolactone ) therapies.  Patient is on pravastatin  + bile acid sequestrant (colestipol) for her HLD diagnosis and ASCVD prevention.  T2DM well-controlled on currently prescribed regimen; last HgbA1c was 6.1% when checked on 01/02/2024.  Patient does have an OSAH diagnosis and is reported to be compliant with prescribed nocturnal PAP therapy.  Functional capacity limited by obesity and multiple medical comorbidities.  With that being said, patient is able to complete all of her ADLs/IADLs without cardiovascular  limitation.  Per the DASI, patient felt to be able to achieve at least 4 METS of physical activity without experiencing any degree of angina/anginal equivalent symptoms.  No changes were made to her medication regimen.  Patient to follow-up with outpatient cardiology and 1-2 months or sooner if needed.   Shelly Huynh is scheduled for an elective RELEASE, CARPAL TUNNEL, ENDOSCOPIC (Right: Wrist) on 03/27/2024 with Dr. Delayne Feather, MD.  Given patient's past medical history significant for cardiovascular diagnoses, presurgical cardiac clearance was sought by the PAT team. Per cardiology, "this patient is optimized for surgery and may proceed with the planned procedural course with a MODERATE risk of significant perioperative cardiovascular complications".  Again, this patient is on daily oral anticoagulation therapy using warfarin. She has been instructed on recommendations for holding her warfarin by her surgeon's office. Clearance received from cardiologist indicated that patient will require enoxaparin  bridging surrounding her procedure. Clearance stated, "need bridging, which is guided by Coumadin  clinic".  Clearance initially sent on 03/20/2024, however not received back until 03/27/2024. From the PAT standpoint, I am uncertain what the patient has done with her warfarin, or if she was bridged. Per review of PAT documentation from patient encounter, she was advised on 03/20/2024 to "follow warfarin instructions given to you by your doctor".  ADDENDUM: 03/27/2024 at 1005 AM: In further review of the available documentation, I see where cardiology did send in enoxaparin  100 mg BID x 4 days for patient on 03/21/2024. Note being updated for comprehensive documentation purposes in efforts to guide the provision of safe and effective care at our facility today.   Patient reports previous perioperative complications with anesthesia in the past.  Patient experience (+) apnea/hypoxia during cardiac ablation  procedure. Patient ultimately required endotracheal intubation which was reported to be uneventful.  In review her EMR, it is noted that patient underwent a general anesthetic course at [facility] ([facility]) on 02/13/2024.     11/22/2023    3:57 PM 11/22/2023  3:48 PM 11/22/2023    3:12 PM  Vitals with BMI  Pulse 82 80 82   Providers/Specialists:  NOTE: Primary physician provider listed below. Patient may have been seen by APP or partner within same practice.   PROVIDER ROLE / SPECIALTY LAST OV  Poggi, Kaylene Pascal, MD Orthopedics (Surgeon) 03/15/2024  Antonio Baumgarten, MD Primary Care Provider 02/29/2024  Joetta Mustache, MD Cardiology 02/08/2024  Earvin Goldberg, MD Physiatry 02/08/2024  Devora Folks, MD Neurology 02/23/2023  Erskin Hearing, MD Pulmonary Medicine 01/17/2024   Allergies:   Allergies  Allergen Reactions   Ivp Dye [Iodinated Contrast Media] Anaphylaxis and Hives    Especially CT contrast media; tightness in chest   Sulfa Antibiotics Hives and Anaphylaxis   Banana Itching   Hctz [Hydrochlorothiazide] Hives   Keflex [Cephalexin] Other (See Comments)    Abdominal pain Tolerated 1st (CEFAZOLIN ) and 2nd (CEFUROXIME) generation cephalosporins  on multiple occasions without documented ADRs.   Losartan Itching   Povidone Iodine Hives   Zofran  [Ondansetron ] Itching   Lisinopril Hives and Rash   Mucinex [Guaifenesin Er] Hives and Rash   Current Home Medications:   No current facility-administered medications for this encounter.    albuterol  (PROVENTIL ) (2.5 MG/3ML) 0.083% nebulizer solution   albuterol  (VENTOLIN  HFA) 108 (90 Base) MCG/ACT inhaler   aspirin  EC 81 MG tablet   candesartan (ATACAND) 8 MG tablet   colestipol (COLESTID) 1 g tablet   CRANBERRY PO   fluticasone  (FLONASE ) 50 MCG/ACT nasal spray   HYDROcodone -acetaminophen  (NORCO/VICODIN) 5-325 MG tablet   iron  polysaccharides (NIFEREX) 150 MG capsule   lansoprazole (PREVACID) 30 MG  capsule   levalbuterol (XOPENEX) 1.25 MG/3ML nebulizer solution   levocetirizine (XYZAL ) 5 MG tablet   memantine (NAMENDA) 5 MG tablet   metoprolol  succinate (TOPROL -XL) 25 MG 24 hr tablet   montelukast  (SINGULAIR ) 10 MG tablet   OZEMPIC, 2 MG/DOSE, 8 MG/3ML SOPN   pravastatin  (PRAVACHOL ) 20 MG tablet   pregabalin  (LYRICA ) 75 MG capsule   spironolactone  (ALDACTONE ) 25 MG tablet   spironolactone  (ALDACTONE ) 50 MG tablet   tiZANidine (ZANAFLEX) 4 MG tablet   varenicline (CHANTIX) 1 MG tablet   Vitamin D, Ergocalciferol, (DRISDOL) 1.25 MG (50000 UNIT) CAPS capsule   warfarin (COUMADIN ) 6 MG tablet   warfarin (COUMADIN ) 7.5 MG tablet   History:   Past Medical History:  Diagnosis Date   (HFpEF) heart failure with preserved ejection fraction (HCC)    Anemia    Anticoagulated on warfarin    Anxiety    a.) on BZO (diazepam ) PRN   Aortic atherosclerosis (HCC)    Aortic valve stenosis and insufficiency, rheumatic    a.) s/p AVR 04/03/2015 - 19 mm St. Jude mechanical valve   Arthritis    Asthma    ATN (acute tubular necrosis) (HCC)    Cardiomegaly    Carpal tunnel syndrome    Chronic constipation    a.) on linaclotide    Chronic lower back pain    Cirrhosis (HCC)    Complication of anesthesia 02/13/2024   a.) apnea/hypoxia during cardiac ablation on 02/13/2024 at [facility] --> required intubation   Coronary artery calcification seen on CT scan    a.) LHC 12/05/2014: normal coronaries   DDD (degenerative disc disease), cervical    Depression    DOE (dyspnea on exertion)    Emphysema of lung (HCC)    GERD (gastroesophageal reflux disease)    Helicobacter pylori infection    Hepatic cysts (scattered)    History of hiatal hernia  History of methicillin resistant staphylococcus aureus (MRSA)    HTN (hypertension)    Hyperaldosteronism (HCC)    Hyperlipidemia    Hypertension    IBS (irritable bowel syndrome)    Lumbar facet joint syndrome    Migraines    Mild cognitive impairment     a,) on NMDA receptor antagonist (memantine)   Mitral stenosis with insufficiency, rheumatic    a.) s/p MVR 04/03/2015 - 25 mm St. Jude mechanical valve   Moderate tricuspid regurgitation    a.) s/p TVR 04/03/2015 - 21 mm Mosaic porcine valve   NASH (nonalcoholic steatohepatitis)    Obesity    OSA on CPAP    Palpitations    Paroxysmal atrial flutter (HCC)    a.) CHA2DS2VASc = 5 (sex, CHF, HTN, vascular disease history, T2DM) as of 03/22/2024; b.) s/p RFA [facility]/13/2024 at [facility]; c.) rate/rhythm maintained on oral metoprolol  succinate; chronically anticoagulated with warfarin   Sciatic pain    T2DM (type 2 diabetes mellitus) (HCC)    Umbilical hernia    a.) s/p repair 09/2004   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HCC)    a.) s/p aortic, mitral, and tricuspid valve replacements 04/03/2015   Vitamin D deficiency    Past Surgical History:  Procedure Laterality Date   ABDOMINAL HYSTERECTOMY  2010   AORTIC VALVE REPLACEMENT  04/03/2015   Procedure: AORTIC VALVE REPLACEMENT (19 mm St. Jude Regent mechanical valve); Location: [facility]; Surgeon: Lacie Piano, MD   ARTERY BIOPSY N/A 10/03/2022   Procedure: BIOPSY TEMPORAL ARTERY;  Surgeon: Eldred Grego, MD;  Location: [facility] ORS;  Service: General;  Laterality: N/A;   BREAST BIOPSY Left 07/25/2017   BENIGN BREAST TISSUE WITH MICROCALCIFICATIONS   BREAST BIOPSY Right 10/22/2019   Affirm Bx- X-clip- CSL, no atypia   BREAST BIOPSY Right 10/22/2019   Affirm Bx- Ribbon clip- CSL, no atypia   BREAST BIOPSY Right 11/20/2019   Procedure: BREAST BIOPSY WITH NEEDLE LOCALIZATION x 2;  Surgeon: Eldred Grego, MD;  Location: [facility] ORS;  Service: General;  Laterality: Right;   BREAST EXCISIONAL BIOPSY Right 1992   NEG   BREAST LUMPECTOMY Right 11/20/2019   complex sclerosing lesion x 2   CESAREAN SECTION N/A 2000   CESAREAN SECTION N/A 1987   CHOLECYSTECTOMY  07/2020   COLONOSCOPY WITH PROPOFOL  N/A 09/21/2020   Procedure: COLONOSCOPY WITH PROPOFOL ;   Surgeon: Toledo, Alphonsus Jeans, MD;  Location: [facility] ENDOSCOPY;  Service: Gastroenterology;  Laterality: N/A;   ESOPHAGOGASTRODUODENOSCOPY (EGD) WITH PROPOFOL  N/A 10/07/2016   Procedure: ESOPHAGOGASTRODUODENOSCOPY (EGD) WITH PROPOFOL ;  Surgeon: Luke Salaam, MD;  Location: [facility] ENDOSCOPY;  Service: Endoscopy;  Laterality: N/A;   ESOPHAGOGASTRODUODENOSCOPY (EGD) WITH PROPOFOL  N/A 09/21/2020   Procedure: ESOPHAGOGASTRODUODENOSCOPY (EGD) WITH PROPOFOL ;  Surgeon: Toledo, Alphonsus Jeans, MD;  Location: [facility] ENDOSCOPY;  Service: Gastroenterology;  Laterality: N/A;   HIATAL HERNIA REPAIR     LAPAROSCOPIC GASTRIC BAND REMOVAL WITH LAPAROSCOPIC GASTRIC SLEEVE RESECTION     LEFT HEART CATH AND CORONARY ANGIOGRAPHY Left 12/05/2014   Procedure: LEFT HEART CATH AND CORONARY ANGIOGRAPHY; Location: [facility]; Surgeon: Thomasene Flemings, MD   LIPOSUCTION TRUNK  1996   MITRAL VALVE REPLACEMENT  04/03/2015   Procedure: MITRAL VALVE REPLACEMENT (25 mm St. Jude Mechanical valve); Location: [facility]; Surgeon: Lacie Piano, MD   TRICUSPID VALVE REPLACEMENT  04/03/2015   Procedure: TRICUSPID VALVE REPLACEMENT (21 mm Mosaic porcine valve); Location: [facility]; Surgeon: Lacie Piano, MD   TUBAL LIGATION  2000   UMBILICAL HERNIA REPAIR  2005   VENTRAL HERNIA REPAIR  Family History  Problem Relation Age of Onset   Thyroid  cancer Mother    Uterine cancer Mother    Breast cancer Paternal Grandmother    Lung cancer Paternal Grandmother    Pancreatic cancer Cousin    Social History   Tobacco Use   Smoking status: Every Day    Current packs/day: 0.50    Average packs/day: 0.5 packs/day for 25.0 years (12.5 ttl pk-yrs)    Types: Cigarettes   Smokeless tobacco: Never  Substance Use Topics   Alcohol use: No   Pertinent Clinical Results:  LABS:  Component Ref Range & Units 02/13/2024  WBC (White Blood Cell Count) 3.2 - 9.8 x10^9/L 4.7  Hemoglobin 11.7 - 15.5 g/dL 91.4  Hematocrit 78.2 - 45.0 % 41.9  Platelets 150 - 450 x10^9/L  253  MCV (Mean Corpuscular Volume) 80 - 98 fL 92  MCH (Mean Corpuscular Hemoglobin) 26.5 - 34.0 pg 29.3  MCHC (Mean Corpuscular Hemoglobin Concentration) 31.0 - 36.0 % 32  RBC (Red Blood Cell Count) 3.77 - 5.16 x10^12/L 4.58  RDW-CV (Red Cell Distribution Width) 11.5 - 14.5 % 16.3 High   NRBC (Nucleated Red Blood Cell Count) 0 x10^9/L 0  NRBC % (Nucleated Red Blood Cell %) % 0  MPV (Mean Platelet Volume) 7.2 - 11.7 fL 11  Resulting Agency [facility]  Specimen Collected: 02/13/24 08:02   Performed by: [facility] Last Resulted: 02/13/24 08:31  Received From: [facility]  Result Received: 03/07/24 12:47   Component Ref Range & Units 02/13/2024  Sodium 135 - 145 mmol/L 139  Potassium 3.5 - 5.0 mmol/L 3.4 Low   Chloride 98 - 108 mmol/L 106  Carbon Dioxide (CO2) 21 - 30 mmol/L 24  Urea Nitrogen (BUN) 7 - 20 mg/dL 3 Low   Creatinine 0.4 - 1.0 mg/dL 0.8  Glucose 70 - 956 mg/dL 80  Calcium 8.7 - 21.3 mg/dL 9.4  Anion Gap 3 - 12 mmol/L 9  BUN/CREA Ratio 6 - 27 4 Low   Glomerular Filtration Rate (eGFR) mL/min/1.73sq m 88  Resulting Agency [facility]  Specimen Collected: 02/13/24 08:02   Performed by: [facility] Last Resulted: 02/13/24 09:19  Received From: [facility]  Result Received: 03/07/24 12:47    ECG: Date: 12/20/2023  Time ECG obtained: 1541 PM Rate: 53 bpm Rhythm: sinus bradycardia Axis (leads I and aVF): normal Intervals: PR 136 ms. QRS 92 ms. QTc 437 ms. ST segment and T wave changes: No evidence of acute T wave abnormalities or significant ST segment elevation or depression.  Evidence of a possible, age undetermined, prior infarct:  No Comparison: Previous tracing obtained on 09/27/2023 revealed sinus tachycardia 1 first-degree AV block at rate of 120 bpm.  There was a nonspecific IVCD and nonspecific T wave abnormalities  noted. NOTE: Tracing obtained at [facility]; unable for review. Above based on cardiologist's interpretation.    IMAGING / PROCEDURES: MRI ABDOMEN WITH AND WITHOUT CONTRAST performed on 02/22/2024 Nodular liver contour without suspicious hepatic lesions.  No suspicious lesions. Unchanged scattered hepatic cysts.  Gallbladder is surgically absent. No intra or extrahepatic biliary ductal dilatation.   TRANSTHORACIC ECHOCARDIOGRAM performed on 03/12/2024 Normal left ventricular systolic function with an EF of >55%. Moderate LVH Indeterminate diastolic function Left atrium severely enlarged Normal right ventricular size and function with a TAPSE measuring 1.5 cm Trivial MR and PR Mild TR Mechanical mitral valve replacement 2016. Mean gradient 3 mmHg at HR 52  bpm  Mechanical aortic valve replacement 2016 with mildly elevated gradients unchanged from prior echo (peak velocity 3.3 m/s, mean gradient 27 mmHg, DI 0.33).  Bioprosthetic tricuspid valve replacement, mean gradient 4 mmHg at HR 52 bpm   MR LUMBAR SPINE WO CONTRAST performed on 02/04/2024 Dominant lumbar degenerative finding is facet arthropathy, with superimposed generally mild lower lumbar disc bulging and endplate spurring. No spinal or lateral recess specks that no significant spinal or lateral recess stenosis. However, an L3-L4 left-side posterior 8 mm degenerative synovial cyst is new since a 2021 MRI, and might be a source for left side radiculitis. Otherwise moderate to severe chronic neural foraminal stenosis at the bilateral L5 nerve levels, moderate left T11 nerve level foraminal stenosis. And mild left L3 and bilateral L4 nerve level foraminal stenosis.  MR CERVICAL SPINE WO CONTRAST performed on 01/06/2024 Mild degenerative changes of the cervical spine. No significant central canal or neural foraminal narrowing.  CT CHEST LCS NODULE F/U LOW DOSE WO CONTRAST performed on 08/23/2023 Lung-RADS 2, benign appearance or behavior.  Continue annual screening with low-dose chest CT without contrast in 12 months. Subtle nodularity of liver contour suggests cirrhosis. Aortic atherosclerosis  Emphysema   MYOCARDIAL PERFUSION IMAGING STUDY (LEXISCAN) performed on 04/13/2022 Normal left ventricular systolic function with a normal LVEF of 64% Normal myocardial thickening and wall motion Left ventricular cavity size normal SPECT images demonstrate homogenous tracer distribution throughout the myocardium No evidence of stress-induced myocardial ischemia or arrhythmia Normal low risk study   CT ABDOMEN PELVIS WO CONTRAST performed on 07/12/2022 There is no evidence of intestinal obstruction or pneumoperitoneum. There is no hydronephrosis.  Appendix is not dilated. There are few small low-density foci in liver, possibly cysts or hemangiomas.  Nodularity in the liver surface suggests possible cirrhosis.   Impression and Plan:  Shelly Huynh has been referred for pre-anesthesia review and clearance prior to her undergoing the planned anesthetic and procedural courses. Available labs, pertinent testing, and imaging results were personally reviewed by me in preparation for upcoming operative/procedural course. [facility] medical record has been updated following extensive record review and patient interview with PAT staff.   This patient has been appropriately cleared by cardiology with an overall MODERATE risk of patient experiencing significant perioperative cardiovascular complications. Based on clinical review performed today (03/22/24), barring any significant acute changes in the patient's overall condition, it is anticipated that she will be able to proceed with the planned surgical intervention. Any acute changes in clinical condition may necessitate her procedure being postponed and/or cancelled. Patient will meet with anesthesia team (MD and/or CRNA) on the day of her procedure for preoperative evaluation/assessment.  Questions regarding anesthetic course will be fielded at that time.   Pre-surgical instructions were reviewed with the patient during his PAT appointment, and questions were fielded to satisfaction by PAT clinical staff. She has been instructed on which medications that she will need to hold prior to surgery, as well as the ones that have been deemed safe/appropriate to take on the day of her procedure. As part of the general education provided by PAT, patient made aware both verbally and in writing, that she would need to abstain from the use of any illegal substances during her perioperative course. She was advised that failure to follow the provided instructions could necessitate case cancellation or result in serious perioperative complications up to and including death. Patient encouraged to contact PAT and/or her surgeon's office to discuss any questions or concerns that may arise prior  to surgery; verbalized understanding.   Shelly Caroline, MSN, APRN, FNP-C, CEN [facility]  Perioperative Services Nurse Practitioner Phone: [phone] Fax: [phone] 03/22/24 4:33 PM  NOTE: This note has been prepared using Dragon dictation software. Despite my best ability to proofread, there is always the potential that unintentional transcriptional errors may still occur from this process.

## 2024-03-27 ENCOUNTER — Other Ambulatory Visit: Payer: Self-pay

## 2024-03-27 ENCOUNTER — Encounter: Payer: Self-pay | Admitting: Surgery

## 2024-03-27 ENCOUNTER — Encounter
Admission: RE | Disposition: A | Discharge status: Home / Self Care | Payer: Self-pay | Source: Home / Self Care | Attending: Surgery

## 2024-03-27 ENCOUNTER — Ambulatory Visit: Payer: Self-pay | Admitting: Urgent Care

## 2024-03-27 ENCOUNTER — Ambulatory Visit
Admission: RE | Admit: 2024-03-27 | Discharge: 2024-03-27 | Disposition: A | Discharge status: Home / Self Care | Attending: Surgery | Admitting: Surgery

## 2024-03-27 DIAGNOSIS — K219 Gastro-esophageal reflux disease without esophagitis: Secondary | ICD-10-CM | POA: Insufficient documentation

## 2024-03-27 DIAGNOSIS — I5032 Chronic diastolic (congestive) heart failure: Secondary | ICD-10-CM | POA: Diagnosis not present

## 2024-03-27 DIAGNOSIS — D649 Anemia, unspecified: Secondary | ICD-10-CM | POA: Diagnosis not present

## 2024-03-27 DIAGNOSIS — I2510 Atherosclerotic heart disease of native coronary artery without angina pectoris: Secondary | ICD-10-CM | POA: Insufficient documentation

## 2024-03-27 DIAGNOSIS — N289 Disorder of kidney and ureter, unspecified: Secondary | ICD-10-CM | POA: Insufficient documentation

## 2024-03-27 DIAGNOSIS — Z6837 Body mass index (BMI) 37.0-37.9, adult: Secondary | ICD-10-CM | POA: Insufficient documentation

## 2024-03-27 DIAGNOSIS — Z953 Presence of xenogenic heart valve: Secondary | ICD-10-CM | POA: Diagnosis not present

## 2024-03-27 DIAGNOSIS — E119 Type 2 diabetes mellitus without complications: Secondary | ICD-10-CM | POA: Insufficient documentation

## 2024-03-27 DIAGNOSIS — Z79899 Other long term (current) drug therapy: Secondary | ICD-10-CM | POA: Diagnosis not present

## 2024-03-27 DIAGNOSIS — F17210 Nicotine dependence, cigarettes, uncomplicated: Secondary | ICD-10-CM | POA: Diagnosis not present

## 2024-03-27 DIAGNOSIS — I110 Hypertensive heart disease with heart failure: Secondary | ICD-10-CM | POA: Diagnosis not present

## 2024-03-27 DIAGNOSIS — G5601 Carpal tunnel syndrome, right upper limb: Secondary | ICD-10-CM | POA: Insufficient documentation

## 2024-03-27 DIAGNOSIS — Z7901 Long term (current) use of anticoagulants: Secondary | ICD-10-CM | POA: Diagnosis not present

## 2024-03-27 DIAGNOSIS — J4489 Other specified chronic obstructive pulmonary disease: Secondary | ICD-10-CM | POA: Diagnosis not present

## 2024-03-27 DIAGNOSIS — E669 Obesity, unspecified: Secondary | ICD-10-CM | POA: Diagnosis not present

## 2024-03-27 DIAGNOSIS — G4733 Obstructive sleep apnea (adult) (pediatric): Secondary | ICD-10-CM | POA: Insufficient documentation

## 2024-03-27 DIAGNOSIS — K7460 Unspecified cirrhosis of liver: Secondary | ICD-10-CM | POA: Diagnosis not present

## 2024-03-27 DIAGNOSIS — Z01812 Encounter for preprocedural laboratory examination: Secondary | ICD-10-CM

## 2024-03-27 DIAGNOSIS — Z8249 Family history of ischemic heart disease and other diseases of the circulatory system: Secondary | ICD-10-CM | POA: Insufficient documentation

## 2024-03-27 DIAGNOSIS — I480 Paroxysmal atrial fibrillation: Secondary | ICD-10-CM | POA: Insufficient documentation

## 2024-03-27 DIAGNOSIS — I5042 Chronic combined systolic (congestive) and diastolic (congestive) heart failure: Secondary | ICD-10-CM | POA: Diagnosis not present

## 2024-03-27 DIAGNOSIS — Z7985 Long-term (current) use of injectable non-insulin antidiabetic drugs: Secondary | ICD-10-CM | POA: Insufficient documentation

## 2024-03-27 DIAGNOSIS — I428 Other cardiomyopathies: Secondary | ICD-10-CM | POA: Insufficient documentation

## 2024-03-27 DIAGNOSIS — E782 Mixed hyperlipidemia: Secondary | ICD-10-CM | POA: Diagnosis not present

## 2024-03-27 DIAGNOSIS — J432 Centrilobular emphysema: Secondary | ICD-10-CM | POA: Insufficient documentation

## 2024-03-27 DIAGNOSIS — G709 Myoneural disorder, unspecified: Secondary | ICD-10-CM | POA: Insufficient documentation

## 2024-03-27 DIAGNOSIS — K7581 Nonalcoholic steatohepatitis (NASH): Secondary | ICD-10-CM | POA: Diagnosis not present

## 2024-03-27 DIAGNOSIS — I4892 Unspecified atrial flutter: Secondary | ICD-10-CM

## 2024-03-27 DIAGNOSIS — E269 Hyperaldosteronism, unspecified: Secondary | ICD-10-CM | POA: Insufficient documentation

## 2024-03-27 HISTORY — DX: Other specified diseases of liver: K76.89

## 2024-03-27 HISTORY — DX: Long term (current) use of anticoagulants: Z79.01

## 2024-03-27 HISTORY — DX: Carpal tunnel syndrome, unspecified upper limb: G56.00

## 2024-03-27 HISTORY — PX: CARPAL TUNNEL RELEASE: SHX101

## 2024-03-27 HISTORY — DX: Mild cognitive impairment of uncertain or unknown etiology: G31.84

## 2024-03-27 HISTORY — DX: Other cervical disc degeneration, unspecified cervical region: M50.30

## 2024-03-27 HISTORY — DX: Unspecified diastolic (congestive) heart failure: I50.30

## 2024-03-27 LAB — GLUCOSE, CAPILLARY
Glucose-Capillary: 77 mg/dL (ref 70–99)
Glucose-Capillary: 89 mg/dL (ref 70–99)

## 2024-03-27 LAB — PROTIME-INR
INR: 1.1 (ref 0.8–1.2)
Prothrombin Time: 14.4 s (ref 11.4–15.2)

## 2024-03-27 SURGERY — RELEASE, CARPAL TUNNEL, ENDOSCOPIC
Anesthesia: General | Site: Wrist | Laterality: Right

## 2024-03-27 MED ORDER — KETOROLAC TROMETHAMINE 15 MG/ML IJ SOLN
15.0000 mg | Freq: Once | INTRAMUSCULAR | Status: AC
  Administered 2024-03-27: 15 mg via INTRAVENOUS

## 2024-03-27 MED ORDER — SUCCINYLCHOLINE CHLORIDE 200 MG/10ML IV SOSY
PREFILLED_SYRINGE | INTRAVENOUS | Status: DC | PRN
  Administered 2024-03-27: 100 mg via INTRAVENOUS

## 2024-03-27 MED ORDER — CHLORHEXIDINE GLUCONATE 0.12 % MT SOLN
15.0000 mL | Freq: Once | OROMUCOSAL | Status: AC
  Administered 2024-03-27: 15 mL via OROMUCOSAL

## 2024-03-27 MED ORDER — EPHEDRINE SULFATE-NACL 50-0.9 MG/10ML-% IV SOSY
PREFILLED_SYRINGE | INTRAVENOUS | Status: DC | PRN
  Administered 2024-03-27 (×2): 10 mg via INTRAVENOUS
  Administered 2024-03-27: 5 mg via INTRAVENOUS

## 2024-03-27 MED ORDER — SODIUM CHLORIDE 0.9 % IV SOLN: INTRAVENOUS | Status: DC | PRN

## 2024-03-27 MED ORDER — KETOROLAC TROMETHAMINE 15 MG/ML IJ SOLN
INTRAMUSCULAR | Status: AC
Start: 2024-03-27 — End: ?
  Filled 2024-03-27: qty 1

## 2024-03-27 MED ORDER — 0.9 % SODIUM CHLORIDE (POUR BTL) OPTIME
TOPICAL | Status: DC | PRN
  Administered 2024-03-27: 500 mL

## 2024-03-27 MED ORDER — HYDROCODONE-ACETAMINOPHEN 5-325 MG PO TABS: 1.0000 | ORAL_TABLET | ORAL | Status: DC | PRN

## 2024-03-27 MED ORDER — PROPOFOL 10 MG/ML IV BOLUS
INTRAVENOUS | Status: DC | PRN
  Administered 2024-03-27: 150 mg via INTRAVENOUS

## 2024-03-27 MED ORDER — MIDAZOLAM HCL 2 MG/2ML IJ SOLN
INTRAMUSCULAR | Status: AC
  Filled 2024-03-27: qty 2

## 2024-03-27 MED ORDER — METOCLOPRAMIDE HCL 10 MG PO TABS: 5.0000 mg | ORAL_TABLET | Freq: Three times a day (TID) | ORAL | Status: DC | PRN

## 2024-03-27 MED ORDER — VANCOMYCIN HCL IN DEXTROSE 1-5 GM/200ML-% IV SOLN
INTRAVENOUS | Status: AC
  Filled 2024-03-27: qty 200

## 2024-03-27 MED ORDER — ONDANSETRON HCL 4 MG/2ML IJ SOLN
INTRAMUSCULAR | Status: AC
  Filled 2024-03-27: qty 2

## 2024-03-27 MED ORDER — FLUTICASONE PROPIONATE 50 MCG/ACT NA SUSP: 2.0000 | Freq: Every day | NASAL | Status: AC | PRN

## 2024-03-27 MED ORDER — METOCLOPRAMIDE HCL 5 MG/ML IJ SOLN: 5.0000 mg | Freq: Three times a day (TID) | INTRAMUSCULAR | Status: DC | PRN

## 2024-03-27 MED ORDER — SODIUM CHLORIDE 0.9% FLUSH: 3.0000 mL | Freq: Two times a day (BID) | INTRAVENOUS | Status: DC

## 2024-03-27 MED ORDER — BUPIVACAINE HCL (PF) 0.5 % IJ SOLN
INTRAMUSCULAR | Status: AC
  Filled 2024-03-27: qty 30

## 2024-03-27 MED ORDER — LEVOFLOXACIN IN D5W 500 MG/100ML IV SOLN
INTRAVENOUS | Status: AC
  Filled 2024-03-27: qty 100

## 2024-03-27 MED ORDER — DEXAMETHASONE SODIUM PHOSPHATE 10 MG/ML IJ SOLN
INTRAMUSCULAR | Status: AC
  Filled 2024-03-27: qty 1

## 2024-03-27 MED ORDER — ONDANSETRON HCL 4 MG/2ML IJ SOLN
INTRAMUSCULAR | Status: DC | PRN
  Administered 2024-03-27: 4 mg via INTRAVENOUS

## 2024-03-27 MED ORDER — VANCOMYCIN HCL IN DEXTROSE 1-5 GM/200ML-% IV SOLN
1000.0000 mg | INTRAVENOUS | Status: DC
Start: 2024-03-27 — End: 2024-03-27

## 2024-03-27 MED ORDER — ORAL CARE MOUTH RINSE: 15.0000 mL | Freq: Once | OROMUCOSAL | Status: AC

## 2024-03-27 MED ORDER — LIDOCAINE HCL (CARDIAC) PF 100 MG/5ML IV SOSY
PREFILLED_SYRINGE | INTRAVENOUS | Status: DC | PRN
  Administered 2024-03-27: 50 mg via INTRAVENOUS

## 2024-03-27 MED ORDER — SODIUM CHLORIDE 0.9% FLUSH: 3.0000 mL | INTRAVENOUS | Status: DC | PRN

## 2024-03-27 MED ORDER — ACETAMINOPHEN 325 MG PO TABS: 325.0000 mg | ORAL_TABLET | Freq: Four times a day (QID) | ORAL | Status: DC | PRN

## 2024-03-27 MED ORDER — FENTANYL CITRATE (PF) 100 MCG/2ML IJ SOLN
INTRAMUSCULAR | Status: AC
  Filled 2024-03-27: qty 2

## 2024-03-27 MED ORDER — FENTANYL CITRATE (PF) 100 MCG/2ML IJ SOLN
INTRAMUSCULAR | Status: DC | PRN
  Administered 2024-03-27: 25 ug via INTRAVENOUS
  Administered 2024-03-27: 50 ug via INTRAVENOUS

## 2024-03-27 MED ORDER — CHLORHEXIDINE GLUCONATE 0.12 % MT SOLN
OROMUCOSAL | Status: AC
  Filled 2024-03-27: qty 15

## 2024-03-27 MED ORDER — SODIUM CHLORIDE 0.9 % IV SOLN: INTRAVENOUS | Status: DC

## 2024-03-27 MED ORDER — DEXAMETHASONE SODIUM PHOSPHATE 10 MG/ML IJ SOLN
INTRAMUSCULAR | Status: DC | PRN
  Administered 2024-03-27: 5 mg via INTRAVENOUS

## 2024-03-27 MED ORDER — BUPIVACAINE HCL (PF) 0.5 % IJ SOLN
INTRAMUSCULAR | Status: DC | PRN
  Administered 2024-03-27: 10 mL

## 2024-03-27 MED ORDER — LEVOFLOXACIN IN D5W 500 MG/100ML IV SOLN
500.0000 mg | INTRAVENOUS | Status: AC
Start: 2024-03-27 — End: 2024-03-27
  Administered 2024-03-27: 500 mg via INTRAVENOUS

## 2024-03-27 MED ORDER — EPHEDRINE 5 MG/ML INJ
INTRAVENOUS | Status: AC
  Filled 2024-03-27: qty 5

## 2024-03-27 MED ORDER — DEXMEDETOMIDINE HCL IN NACL 80 MCG/20ML IV SOLN
INTRAVENOUS | Status: DC | PRN
  Administered 2024-03-27: 8 ug via INTRAVENOUS

## 2024-03-27 SURGICAL SUPPLY — 32 items
BNDG COHESIVE 4X5 TAN STRL LF (GAUZE/BANDAGES/DRESSINGS) ×1 IMPLANT
BNDG ELASTIC 2INX 5YD STR LF (GAUZE/BANDAGES/DRESSINGS) ×1 IMPLANT
BNDG ELASTIC 2X5.8 VLCR NS LF (GAUZE/BANDAGES/DRESSINGS) IMPLANT
BNDG ESMARCH 4X12 STRL LF (GAUZE/BANDAGES/DRESSINGS) ×1 IMPLANT
CHLORAPREP W/TINT 26 (MISCELLANEOUS) ×1 IMPLANT
CORD BIP STRL DISP 12FT (MISCELLANEOUS) ×1 IMPLANT
CUFF TOURN SGL QUICK 18X4 (TOURNIQUET CUFF) ×1 IMPLANT
DRAPE SURG 17X11 SM STRL (DRAPES) ×1 IMPLANT
DRSG XEROFORM 1X8 (GAUZE/BANDAGES/DRESSINGS) IMPLANT
FORCEPS JEWEL BIP 4-3/4 STR (INSTRUMENTS) ×1 IMPLANT
GAUZE SPONGE 4X4 12PLY STRL (GAUZE/BANDAGES/DRESSINGS) ×1 IMPLANT
GAUZE XEROFORM 1X8 LF (GAUZE/BANDAGES/DRESSINGS) ×1 IMPLANT
GLOVE BIO SURGEON STRL SZ8 (GLOVE) ×1 IMPLANT
GLOVE INDICATOR 8.0 STRL GRN (GLOVE) ×1 IMPLANT
GOWN STRL REUS W/ TWL LRG LVL3 (GOWN DISPOSABLE) ×1 IMPLANT
GOWN STRL REUS W/ TWL XL LVL3 (GOWN DISPOSABLE) ×1 IMPLANT
KIT ESCP INSRT D SLOT CANN KN (MISCELLANEOUS) ×1 IMPLANT
KIT TURNOVER KIT A (KITS) ×1 IMPLANT
MANIFOLD NEPTUNE II (INSTRUMENTS) ×1 IMPLANT
NS IRRIG 500ML POUR BTL (IV SOLUTION) ×1 IMPLANT
PACK EXTREMITY ARMC (MISCELLANEOUS) ×1 IMPLANT
PENCIL SMOKE EVACUATOR (MISCELLANEOUS) ×1 IMPLANT
SPLINT WRIST LG LT TX990309 (SOFTGOODS) IMPLANT
SPLINT WRIST LG RT TX900304 (SOFTGOODS) IMPLANT
SPLINT WRIST M LT TX990308 (SOFTGOODS) IMPLANT
SPLINT WRIST M RT TX990303 (SOFTGOODS) IMPLANT
SPLINT WRIST XL LT TX990310 (SOFTGOODS) IMPLANT
SPLINT WRIST XL RT TX990305 (SOFTGOODS) IMPLANT
STOCKINETTE IMPERVIOUS 9X36 MD (GAUZE/BANDAGES/DRESSINGS) ×1 IMPLANT
SUT PROLENE 4 0 PS 2 18 (SUTURE) ×1 IMPLANT
TRAP FLUID SMOKE EVACUATOR (MISCELLANEOUS) ×1 IMPLANT
WATER STERILE IRR 500ML POUR (IV SOLUTION) IMPLANT

## 2024-03-27 NOTE — Transfer of Care (Signed)
 Immediate Anesthesia Transfer of Care Note  Patient: Shelly Huynh  Procedure(s) Performed: RELEASE, CARPAL TUNNEL, ENDOSCOPIC (Right: Wrist)  Patient Location: PACU  Anesthesia Type:General  Level of Consciousness: awake, drowsy, and patient cooperative  Airway & Oxygen Therapy: Patient Spontanous Breathing and Patient connected to face mask oxygen  Post-op Assessment: Report given to RN and Post -op Vital signs reviewed and stable  Post vital signs: Reviewed  Last Vitals:  Vitals Value Taken Time  BP 104/69 03/27/24 1102  Temp 36.1 C 03/27/24 1102  Pulse 76 03/27/24 1104  Resp 14 03/27/24 1104  SpO2 99 % 03/27/24 1104  Vitals shown include unfiled device data.  Last Pain:  Vitals:   03/27/24 0921  TempSrc: Temporal  PainSc: 0-No pain      Patients Stated Pain Goal: 0 (03/27/24 0921)  Complications: No notable events documented.

## 2024-03-27 NOTE — Discharge Instructions (Addendum)
 Orthopedic discharge instructions: Keep dressing dry and intact. Keep hand elevated above heart level. May shower after dressing removed on postop day 4 (Sunday). Cover sutures with Band-Aids after drying off, then reapply Velcro splint. Apply ice to affected area frequently. Take ES Tylenol  or pain medication as prescribed when needed.  May resume daily baby aspirin  tomorrow morning. Return for follow-up in 10-14 days or as scheduled.

## 2024-03-27 NOTE — Op Note (Signed)
 03/27/2024  10:54 AM  Patient:   Shelly Huynh  Pre-Op Diagnosis:   Right carpal tunnel syndrome.  Post-Op Diagnosis:   Same.  Procedure:   Endoscopic right carpal tunnel release.  Surgeon:   Lonnie Roberts, MD  Anesthesia:   General LMA  Findings:   As above.  Complications:   None  EBL:   0 cc  Fluids:   300 cc crystalloid  TT:   11 minutes at 250 mmHg  Drains:   None  Closure:   4-0 Prolene interrupted sutures  Brief Clinical Note:   The patient is a 53 year old female with a history of progressively worsening pain and paresthesias to her right hand. Her symptoms have progressed despite medications, activity modification, etc. Her history and examination are consistent with carpal tunnel syndrome, confirmed by EMG. The patient presents at this time for an endoscopic right carpal tunnel release.   Procedure:   The patient was brought into the operating room and lain in the supine position. After adequate general laryngeal mask anesthesia was obtained, the right hand and upper extremity were prepped with ChloraPrep solution before being draped sterilely. Preoperative antibiotics were administered. A timeout was performed to verify the appropriate surgical site before the limb was exsanguinated with an Esmarch and the tourniquet inflated to 250 mmHg.   An approximately 1.5-2 cm incision was made over the volar wrist flexion crease, centered over the palmaris longus tendon. The incision was carried down through the subcutaneous tissues with care taken to identify and protect any neurovascular structures. The distal forearm fascia was penetrated just proximal to the transverse carpal ligament. The soft tissues were released off the superficial and deep surfaces of the distal forearm fascia and this was released proximally for 3-4 cm under direct visualization.  Attention was directed distally. The Therapist, nutritional was passed beneath the transverse carpal ligament along the ulnar  aspect of the carpal tunnel and used to release any adhesions as well as to remove any adherent synovial tissue before first the smaller then the larger of the two dilators were passed beneath the transverse carpal ligament along the ulnar margin of the carpal tunnel. The slotted cannula was introduced and the endoscope was placed into the slotted cannula and the undersurface of the transverse carpal ligament visualized. The distal margin of the transverse carpal ligament was marked by placing a 25-gauge needle percutaneously at Kaplan's cardinal point so that it entered the distal portion of the slotted cannula. Under endoscopic visualization, the transverse carpal ligament was released from proximal to distal using the end-cutting blade. A second pass was performed to ensure complete release of the ligament. The adequacy of release was verified both endoscopically and by palpation using the freer elevator.  The wound was irrigated thoroughly with sterile saline solution before being closed using 4-0 Prolene interrupted sutures. A total of 10 cc of 0.5% plain Sensorcaine  was injected in and around the incision before a sterile bulky dressing was applied to the wound. The patient was placed into a volar wrist splint before being awakened, extubated, and returned to the recovery room in satisfactory condition after tolerating the procedure well.

## 2024-03-27 NOTE — Anesthesia Preprocedure Evaluation (Signed)
 Anesthesia Evaluation  Patient identified by MRN, date of birth, ID band Patient awake    Reviewed: Allergy & Precautions, NPO status , Patient's Chart, lab work & pertinent test results  Airway Mallampati: III  TM Distance: >3 FB Neck ROM: full    Dental no notable dental hx. (+) Chipped   Pulmonary shortness of breath and with exertion, asthma , sleep apnea and Continuous Positive Airway Pressure Ventilation , COPD,  COPD inhaler, Current SmokerPatient did not abstain from smoking.   Pulmonary exam normal breath sounds clear to auscultation       Cardiovascular hypertension, On Medications and On Home Beta Blockers + CAD, +CHF and + DOE  + dysrhythmias Atrial Fibrillation + Valvular Problems/Murmurs  Rhythm:Regular Rate:Normal - Systolic murmurs AVR, MVR, TVR  Most recent TTE was performed on 08/13/2021 revealing a normal left ventricular systolic function with moderate concentric LVH; LVEF >55%. Left ventricular diastolic Doppler parameters consistent with a restrictive filling pattern (G3DD).  Left atrium moderately enlarged and right atrium MILDLY enlarged.  Right ventricle also noted to be mildly enlarged.  Mechanical aortic valve well-seated and functioning properly; mean transvalvular gradient 23.1 mmHg.  Bioprosthetic mechanical mitral valve also well-seated with normal function; mean transvalvular gradient 4.5 mmHg.  Bioprosthetic tricuspid valve in normal position and functioning normally; peak TR velocity to 11.1 cm/sec.  Most recent myocardial perfusion imaging study was performed on 04/13/2022 revealing a normal left ventricular systolic function with an EF of 64%.  There was normal regional wall motion.  There was no evidence of stress-induced myocardial ischemia or arrhythmia; no scintigraphic evidence of scar.  Study determined to be normal and low risk overall.       Neuro/Psych  Headaches PSYCHIATRIC DISORDERS Anxiety  Depression     Neuromuscular disease    GI/Hepatic hiatal hernia,GERD  Medicated,,(+) Cirrhosis         Endo/Other  diabetes, Type 2  Hyperaldosteronism  GLP1 agonist last taken 6 days ago. Denies GI symptoms today  Renal/GU Renal disease     Musculoskeletal   Abdominal   Peds  Hematology  (+) Blood dyscrasia, anemia   Anesthesia Other Findings Past Medical History: No date: Anemia No date: Anxiety     Comment:  a.) on BZO (diazepam ) PRN No date: Aortic atherosclerosis (HCC) No date: Aortic valve stenosis and insufficiency, rheumatic     Comment:  a.) s/p AVR 04/03/2015 - 19 mm St. Jude mechanical valve No date: Arthritis No date: Asthma No date: ATN (acute tubular necrosis) (HCC) No date: Cardiomegaly No date: CHF (congestive heart failure) (HCC)     Comment:  a.) TTE 08/13/2022: EF >55%, mod LAE, mild RV/RAE,               triv-mild panvalvular regurgitation, G3DD. No date: Chronic anticoagulation No date: Chronic constipation     Comment:  a.) on linaclotide  No date: Chronic lower back pain No date: Cirrhosis (HCC) No date: Coronary artery calcification seen on CT scan     Comment:  a.) LHC 12/05/2014: normal coronaries No date: Depression No date: DOE (dyspnea on exertion) No date: Emphysema of lung (HCC) No date: GERD (gastroesophageal reflux disease) No date: Helicobacter pylori infection No date: History of hiatal hernia No date: History of methicillin resistant staphylococcus aureus (MRSA) No date: HTN (hypertension) No date: Hyperaldosteronism (HCC) No date: Hyperlipidemia No date: Hypertension No date: IBS (irritable bowel syndrome) No date: Long term current use of anticoagulant     Comment:  a.) warfarin No date:  Lumbar facet joint syndrome No date: Migraines No date: Mitral stenosis with insufficiency, rheumatic     Comment:  a.) s/p MVR 04/03/2015 - 25 mm St. Jude mechanical valve No date: Moderate tricuspid regurgitation     Comment:  a.)  s/p TVR 04/03/2015 - 21 mm Mosaic porcine valve No date: NASH (nonalcoholic steatohepatitis) No date: Obesity No date: OSA on CPAP No date: Palpitations No date: Paroxysmal atrial flutter (HCC)     Comment:  a.) CHA2DS2VASc = 5 (sex, CHF, HTN, vascular disease               history, T2DM);  b.) rate/rhythm maintained without               pharmacological intervention; chronically anticoagulated               with warfarin No date: Sciatic pain No date: T2DM (type 2 diabetes mellitus) (HCC) No date: Umbilical hernia     Comment:  a.) s/p repair 09/2004 No date: 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HCC)     Comment:  a.) s/p aortic, mitral, and tricuspid valve replacements              04/03/2015 No date: Vitamin D deficiency  Past Surgical History: 2010: ABDOMINAL HYSTERECTOMY 04/03/2015: AORTIC VALVE REPLACEMENT     Comment:  Procedure: AORTIC VALVE REPLACEMENT (19 mm St. Jude               Regent mechanical valve); Location: [facility]; Surgeon: Lacie Piano, MD 07/25/2017: BREAST BIOPSY; Left     Comment:  BENIGN BREAST TISSUE WITH MICROCALCIFICATIONS 10/22/2019: BREAST BIOPSY; Right     Comment:  Affirm Bx- X-clip- CSL, no atypia 10/22/2019: BREAST BIOPSY; Right     Comment:  Affirm Bx- Ribbon clip- CSL, no atypia 11/20/2019: BREAST BIOPSY; Right     Comment:  Procedure: BREAST BIOPSY WITH NEEDLE LOCALIZATION x 2;                Surgeon: Eldred Grego, MD;  Location: [facility];               Service: General;  Laterality: Right; 1992: BREAST EXCISIONAL BIOPSY; Right     Comment:  NEG 11/20/2019: BREAST LUMPECTOMY; Right     Comment:  complex sclerosing lesion x 2 2000: CESAREAN SECTION; N/A 1987: CESAREAN SECTION; N/A 07/2020: CHOLECYSTECTOMY 09/21/2020: COLONOSCOPY WITH PROPOFOL ; N/A     Comment:  Procedure: COLONOSCOPY WITH PROPOFOL ;  Surgeon: [place],               Alphonsus Jeans, MD;  Location: [facility];  Service:               Gastroenterology;  Laterality:  N/A; 10/07/2016: ESOPHAGOGASTRODUODENOSCOPY (EGD) WITH PROPOFOL ; N/A     Comment:  Procedure: ESOPHAGOGASTRODUODENOSCOPY (EGD) WITH               PROPOFOL ;  Surgeon: Luke Salaam, MD;  Location: [facility]               ENDOSCOPY;  Service: Endoscopy;  Laterality: N/A; 09/21/2020: ESOPHAGOGASTRODUODENOSCOPY (EGD) WITH PROPOFOL ; N/A     Comment:  Procedure: ESOPHAGOGASTRODUODENOSCOPY (EGD) WITH               PROPOFOL ;  Surgeon: [place], Alphonsus Jeans, MD;  Location:               [facility];  Service: Gastroenterology;  Laterality:  N/A; No date: LAPAROSCOPIC GASTRIC BAND REMOVAL WITH LAPAROSCOPIC GASTRIC  SLEEVE RESECTION 12/05/2014: LEFT HEART CATH AND CORONARY ANGIOGRAPHY; Left     Comment:  Procedure: LEFT HEART CATH AND CORONARY ANGIOGRAPHY;               Location: [facility]; Surgeon: Thomasene Flemings, MD 1996: LIPOSUCTION TRUNK 04/03/2015: MITRAL VALVE REPLACEMENT     Comment:  Procedure: MITRAL VALVE REPLACEMENT (25 mm St. Jude               Mechanical valve); Location: [facility]; Surgeon: Lacie Piano,              MD 04/03/2015: TRICUSPID VALVE REPLACEMENT     Comment:  Procedure: TRICUSPID VALVE REPLACEMENT (21 mm Mosaic               porcine valve); Location: [facility]; Surgeon: Lacie Piano, MD 2000: TUBAL LIGATION 2005: UMBILICAL HERNIA REPAIR No date: VENTRAL HERNIA REPAIR  BMI    Body Mass Index: 42.34 kg/m      Reproductive/Obstetrics negative OB ROS                             Anesthesia Physical Anesthesia Plan  ASA: 3  Anesthesia Plan: General   Post-op Pain Management: Ofirmev  IV (intra-op)*   Induction: Intravenous and Rapid sequence  PONV Risk Score and Plan: 1 and Ondansetron , Dexamethasone  and Midazolam   Airway Management Planned: Oral ETT and Video Laryngoscope Planned  Additional Equipment: None  Intra-op Plan:   Post-operative Plan: Extubation in OR  Informed Consent: I have reviewed the patients History and Physical, chart,  labs and discussed the procedure including the risks, benefits and alternatives for the proposed anesthesia with the patient or authorized representative who has indicated his/her understanding and acceptance.     Dental advisory given  Plan Discussed with: CRNA and Surgeon  Anesthesia Plan Comments: (Patient currently takes a GLP-1 agonist medication. Recent guidelines from American Society of Anesthesiologists recommend for cessation of weekly injectable medication for one week prior to elective procedure, and of daily medication for one day prior to elective procedure. If these guidelines have not been adhered to, a risk/benefit discussion should be had with patient, and clinical judgement used. I spoke with the patient and proceduralist about the r/b/a of proceeding with elective surgeries in these instances, and both understand and accept the risks of proceeding with full stomach precautions.  Discussed risks of anesthesia with patient, including PONV, sore throat, lip/dental/eye damage. Rare risks discussed as well, such as cardiorespiratory and neurological sequelae, and allergic reactions. Discussed the role of CRNA in patient's perioperative care. Patient understands.  Patient counseled on benefits of smoking cessation, and increased perioperative risks associated with continued smoking. )       Anesthesia Quick Evaluation

## 2024-03-27 NOTE — Anesthesia Postprocedure Evaluation (Signed)
 Anesthesia Post Note  Patient: Unknown Garbe  Procedure(s) Performed: RELEASE, CARPAL TUNNEL, ENDOSCOPIC (Right: Wrist)  Patient location during evaluation: PACU Anesthesia Type: General Level of consciousness: awake and alert Pain management: pain level controlled Vital Signs Assessment: post-procedure vital signs reviewed and stable Respiratory status: spontaneous breathing, nonlabored ventilation, respiratory function stable and patient connected to nasal cannula oxygen Cardiovascular status: blood pressure returned to baseline and stable Postop Assessment: no apparent nausea or vomiting Anesthetic complications: no   No notable events documented.   Last Vitals:  Vitals:   03/27/24 0921 03/27/24 1102  BP: (!) 131/95 104/69  Pulse: (!) 107 76  Resp: 16 13  Temp: 36.4 C (!) 36.1 C  SpO2: 99% 97%    Last Pain:  Vitals:   03/27/24 1102  TempSrc:   PainSc: 0-No pain                 Lattie Poli

## 2024-03-27 NOTE — Anesthesia Procedure Notes (Signed)
 Procedure Name: Intubation Date/Time: 03/27/2024 10:18 AM  Performed by: Orin Birk, CRNAPre-anesthesia Checklist: Patient identified, Emergency Drugs available, Suction available and Patient being monitored Patient Re-evaluated:Patient Re-evaluated prior to induction Oxygen Delivery Method: Circle system utilized Preoxygenation: Pre-oxygenation with 100% oxygen Induction Type: IV induction and Rapid sequence Laryngoscope Size: McGrath and 3 Grade View: Grade I Tube type: Oral Tube size: 6.5 mm Number of attempts: 1 Airway Equipment and Method: Stylet and Video-laryngoscopy

## 2024-03-27 NOTE — H&P (Signed)
 History of Present Illness: Shelly Huynh is a 53 y.o. female who presents today for her surgical history and physical for upcoming right endoscopic carpal tunnel release scheduled with Dr. Daun Epstein on 03/27/2024. The patient denies any trauma or injury since her last evaluation. Since her last appointment the patient did undergo a cardiac ablation. She has been followed by cardiology at this time. The patient does have a history of mechanical heart valve replacement and does take warfarin. The patient does have a history of COPD in addition asthma. No history of DVT. [name] a diabetic. She reports a 10 out of 10 pain in the right wrist and hand. The patient does take hydrocodone  chronically for ongoing low back discomfort. She continues to report increased pain in the right hand especially at night.  Past Medical History: 3-vessel CAD 09/13/2014 by CTscan  Allergy  Anxiety  Aortic valve stenosis and insufficiency, rheumatic 01/15/2015  Arthritis  Asthma without status asthmaticus (HHS-HCC)  ATN (acute tubular necrosis) ()  Centrilobular emphysema (CMS/HHS-HCC) 04/25/2022  CHF (congestive heart failure) (CMS/HHS-HCC)  Chronic anticoagulation 05/07/2015  Coumadin  anti-coagulation therapy mechanical heart valve prosthesis with goal INR range 2.5-3.5  Chronic diastolic heart failure, NYHA class 2 (CMS/HHS-HCC) 03/04/2015  Coronary artery disease due to calcified coronary lesion 03/04/2015  1. As determined by CT scan with Dr. Bary Likes (Cardiology)  COVID-19 2022  Current smoker 11/10/2016  Depression  Diabetes mellitus type 2, diet-controlled (CMS/HHS-HCC) 05/17/2016  Emphysema/COPD (CMS/HHS-HCC) 04/25/2022  Encounter for blood transfusion  Essential hypertension 09/11/2014  Fever in adult 07/08/2017  GERD (gastroesophageal reflux disease)  H/O mechanical aortic valve replacement 04/03/2015  19 mm Saint Jude aortic valve prosthesis  H/O mitral valve replacement with mechanical valve 04/03/2015   25 mm Saint Jude mechanical mitral valve prosthesis  Hepatic disease  History of tricuspid valve replacement with bioprosthetic valve 04/03/2015  29 mm Mosaic biologic tricuspid valve prosthesis  Hyperlipemia, mixed 09/11/2014  Hypertension  Long term current use of anticoagulants with INR goal of 2.5-3.5 05/11/2015  Major depressive disorder, recurrent, 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  Mitral stenosis with insufficiency, 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  Moderate tricuspid regurgitation 01/15/2015  Obesity  OSA (obstructive sleep apnea) 10/28/2014  Postoperative anemia  Postoperative infection of wound of sternum  Refusal of blood transfusions as patient is Jehovah's Witness 03/12/2015  Shortness of breath 10/28/2014  Sleep apnea  Tobacco abuse   Past Surgical History: LIPOSUCTION TRUNK 1996  TUBAL LIGATION 08/1999  UMBILICAL HERNIA REPAIR 2005  HERNIA REPAIR 09/2004  HYSTERECTOMY 2010 (TAH still has ovaries, [facility])  Cardiac Catherization 12/05/2014  CARDIAC VALVE REPLACEMENT 04/02/2015  REPLACEMENT AORTIC VALVE N/A 04/03/2015  Procedure: REPLACEMENT AORTIC VALVE; Surgeon: Providence Brow, MD; Location: [facility]; Service: Cardiothoracic; Laterality: N/A;  REPLACEMENT MITRAL VALVE N/A 04/03/2015  Procedure: REPLACEMENT MITRAL VALVE; Surgeon: Providence Brow, MD; Location: [facility]; Service: Cardiothoracic; Laterality: N/A;  REPLACEMENT TRICUSPID VALVE N/A 04/03/2015  Procedure: REPLACEMENT TRICUSPID VALVE ; Surgeon: Providence Brow, MD; Location: [facility]; Service: Cardiothoracic; Laterality: N/A;  STERNOTOMY MEDIAN N/A 04/03/2015  Procedure: STERNOTOMY MEDIAN; Surgeon: Providence Brow, MD; Location: [facility]; Service: Cardiothoracic; Laterality: N/A;  BREAST EXCISIONAL BIOPSY Right 11/20/2019 (Dr Charmel Cooter)  CHOLECYSTECTOMY 07/2020  COLONOSCOPY 09/21/2020 (Tubular adenomas/Repeat 44yrs/TKT)  EGD 09/21/2020 (Gastritis/No Repeat/TKT)   CESAREAN SECTION 1987 and 2000   Past Family History: Uterine cancer Mother  Thyroid  disease Mother  Thyroid  cancer Mother  No Known Problems Father  Arthritis Maternal Grandmother  Coronary Artery Disease (Blocked arteries around  heart) Maternal Grandmother  High blood pressure (Hypertension) Maternal Grandmother  Breast cancer Paternal Grandmother  Coronary Artery Disease (Blocked arteries around heart) Paternal Grandmother  High blood pressure (Hypertension) Paternal Grandmother  Thyroid  disease Paternal Grandmother  Coronary Artery Disease (Blocked arteries around heart) Other  grandparents  Anesthesia problems Neg Hx   Medications: fluconazole (DIFLUCAN) 150 MG tablet  albuterol  90 mcg/actuation inhaler Inhale 2 inhalations into the lungs every 6 (six) hours as needed for Wheezing for up to 180 days 6.7 g 5  ascorbic acid, vitamin C, (VITAMIN C) 500 MG tablet Take 500 mg by mouth once daily  aspirin  81 MG EC tablet Take 1 tablet (81 mg total) by mouth once daily 90 tablet 3  blood glucose diagnostic (CONTOUR NEXT TEST STRIPS) test strip 1 each (1 strip total) 3 (three) times daily Use as instructed. 200 each 4  candesartan (ATACAND) 8 MG tablet Take 1 tablet (8 mg total) by mouth once daily 90 tablet 3  colestipoL (COLESTID) 1 gram tablet Take 1 tablet (1 g total) by mouth 2 (two) times daily for 60 days 60 tablet 3  CONTOUR NEXT METER kit by XX route as directed 1 each 0  CRANBERRY ORAL Take 2 capsules by mouth once daily  cyclobenzaprine  (FLEXERIL ) 10 MG tablet Take 1 tablet (10 mg total) by mouth 3 (three) times daily as needed 30 tablet 1  ergocalciferol, vitamin D2, 1,250 mcg (50,000 unit) capsule TAKE 1 CAPSULE BY MOUTH 1 TIME A WEEK 12 capsule 1  fluticasone  propionate (FLONASE ) 50 mcg/actuation nasal spray Place 2 sprays into both nostrils 2 (two) times daily 16 g 5  HYDROcodone -acetaminophen  (NORCO) 5-325 mg tablet Take 1 tablet by mouth 2 (two) times daily as needed  for up to 30 days 60 tablet 0  hydrocortisone  2.5 % cream  ipratropium-albuteroL  (DUO-NEB) nebulizer solution Take 3 mLs by nebulization 4 (four) times daily for 360 days (Patient taking differently: Take 3 mLs by nebulization 4 (four) times daily as needed) 360 mL 5  iron  polysaccharides (POLY-IRON ) 150 mg iron  capsule Take 1 capsule (150 mg total) by mouth once daily 90 capsule 3  lancing device with lancets kit Use 1 each 3 (three) times daily Use as instructed. 200 each 4  lansoprazole (PREVACID) 30 MG DR capsule TAKE 1 CAPSULE(30 MG) BY MOUTH DAILY 30 capsule 3  levalbuterol (XOPENEX) 1.25 mg/3 mL nebulizer solution Take 3 mLs (1.25 mg total) by nebulization every 8 (eight) hours as needed for Wheezing 198 mL 1  levocetirizine (XYZAL ) 5 MG tablet Take 1 tablet (5 mg total) by mouth every evening 90 tablet 1  meclizine  (ANTIVERT ) 25 mg tablet Take 1 tablet (25 mg total) by mouth every 6 (six) hours as needed 30 tablet 3  memantine (NAMENDA) 5 MG tablet Take 1 tablet (5 mg total) by mouth 2 (two) times daily Follow instructions given in office per after visit summary 60 tablet 11  metoprolol  SUCCinate (TOPROL -XL) 25 MG XL tablet Take 1 tablet (25 mg total) by mouth once daily 90 tablet 3  minoxidiL 5 % Foam  montelukast  (SINGULAIR ) 10 mg tablet TAKE 1 TABLET(10 MG) BY MOUTH AT BEDTIME 90 tablet 1  pravastatin  (PRAVACHOL ) 20 MG tablet Take 1 tablet (20 mg total) by mouth once daily 90 tablet 3  pregabalin  (LYRICA ) 75 MG capsule Take 1 capsule (75 mg total) by mouth 2 (two) times daily 60 capsule 5  promethazine -dextromethorphan (PROMETHAZINE -DM) 6.25-15 mg/5 mL syrup Take 5 mLs by mouth every 6 (six) hours  as needed 120 mL 0  prothrombin time test strips (COAGUCHEK XS) Strp Use 1 strip once a week 18 strip 3  semaglutide (OZEMPIC) 2 mg/dose (8 mg/3 mL) pen injector Inject 0.75 mLs (2 mg total) subcutaneously once a week 3 mL 10  spironolactone  (ALDACTONE ) 25 MG tablet Take 1 tablet (25 mg total)  by mouth once daily 90 tablet 1  spironolactone  (ALDACTONE ) 50 MG tablet Take 1 tablet (50 mg total) by mouth once daily for 180 days Patient takes 50 mg plus 25 mg 90 tablet 1  tiZANidine (ZANAFLEX) 4 MG tablet Take 1 tablet (4 mg total) by mouth 3 (three) times daily 90 tablet 2  varenicline tartrate (CHANTIX) 1 mg tablet Take 1 tablet (1 mg total) by mouth 2 (two) times daily for 60 days 60 tablet 1  warfarin (COUMADIN ) 1 MG tablet Take 1 tablet (1 mg total) by mouth once daily 90 tablet 1  warfarin (COUMADIN ) 4 MG tablet TAKE 1 TABLET(4 MG) BY MOUTH EVERY DAY 30 tablet 11  warfarin (COUMADIN ) 6 MG tablet Take 2 tablets (12 mg total) by mouth once daily (Patient taking differently: Take 6 mg by mouth once daily 6 mg every day except Sunday take 7.5mg ) 90 tablet 1  warfarin (COUMADIN ) 7.5 MG tablet Take 1 tablet (7.5 mg total) by mouth as directed 90 tablet 4   Allergies: Iodinated Contrast Media Anaphylaxis and Hives  Especially CT contrast media; tightness in chest  Sulfa (Sulfonamide Antibiotics) Anaphylaxis  Keflex [Cephalexin] Abdominal Pain  Betadine [Povidone-Iodine] Hives  Lisinopril Rash  Losartan Itching  Mucinex [Guaifenesin] Hives  Zofran  [Ondansetron  Hcl] Itching   Review of Systems:  A comprehensive 14 point ROS was performed, reviewed by me today, and the pertinent orthopaedic findings are documented in the HPI.  Physical Exam: BP (!) 134/90  Ht 160 cm (5' 2.99")  Wt 100.5 kg (221 lb 9.6 oz)  BMI 39.27 kg/m  General/Constitutional: The patient appears to be well-nourished, well-developed, and in no acute distress. Neuro/Psych: Normal mood and affect, oriented to person, place and time. Eyes: Non-icteric. Pupils are equal, round, and reactive to light, and exhibit synchronous movement. ENT: Unremarkable. Lymphatic: No palpable adenopathy. Respiratory: Lungs clear to auscultation, Normal chest excursion, No wheezes, and Non-labored breathing Cardiovascular: Regular  rate and rhythm. No murmurs. and No edema, swelling or tenderness, except as noted in detailed exam. Integumentary: No impressive skin lesions present, except as noted in detailed exam. Musculoskeletal: Unremarkable, except as noted in detailed exam.  Neck: Neck has full range of motion. There is no tenderness to palpation. Spurling's test is negative.   Bilateral Upper Extremity: Normal shoulder contour. Good active and passive range of motion and stability of the shoulder, elbow, and wrist. Normal motion of the hand and digits. No swelling, erythema, or ecchymosis is noted. There is no triggering or locking of the digits noted. No thenar atrophy is visualized. No intrinsic wasting. The patient has a positive right Phalen's test. The patient has a positive right Tinel's test. The patient has decreased pinprick and light touch sensation in the median nerve distribution in the right greater than left hand.. There is normal grip strength and pincer strength. The patient has less than 2 second capillary refill with good skin warmth. Normal radial and ulnar pulse is palpated.   Neurologic: Sensory function is intact, except as noted above. Motor strength is 5/5, except as noted above. No tremor or clonus is present. Good motor coordination is noted.   Imaging: Nerve conduction  study from 11/01/2021 impression: This is an abnormal electrodiagnostic exam consistent with 1) Right moderate (grade III) carpal tunnel syndrome (median nerve entrapment at wrist). 2) Left chronic C6 radiculopathy.   MRI CERVICAL SPINE WITHOUT CONTRAST:  1. Mild degenerative changes of the cervical spine resulting in mild  left neural foraminal narrowing at C2-3 and C4-5.  2. Congenitally small spinal canal without high-grade spinal canal  stenosis at any level.   X-ray cervical spine 2 to 3 views:  Straightening of the normal cervical doses is very present placement, muscle spasm or positioning.  Impression: Right  carpal tunnel syndrome:  Plan:  1. Treatment options were discussed today with the patient. 2. The patient is scheduled for a right endoscopic carpal release with Dr. Daun Epstein on 03/27/2024. 3. The patient was instructed on the risk and benefits of surgical intervention wishes to proceed at this time. 4. This document will serve as a surgical history and physical for the patient. The patient will need to undergo cardiac clearance prior to surgery. 5. The patient will follow-up per standard postop protocol.  The procedure was discussed with the patient, as were the potential risks (including bleeding, infection, nerve and/or blood vessel injury, persistent or recurrent pain, failure of the release, continued burning and tingling, need for further surgery, blood clots, strokes, heart attacks and/or arhythmias, pneumonia, etc.) and benefits. The patient states her understanding and wishes to proceed.    H&P reviewed and patient re-examined. No changes.

## 2024-03-28 ENCOUNTER — Encounter: Payer: Self-pay | Admitting: Surgery

## 2024-03-29 DIAGNOSIS — Z952 Presence of prosthetic heart valve: Secondary | ICD-10-CM | POA: Diagnosis not present

## 2024-03-29 DIAGNOSIS — I4892 Unspecified atrial flutter: Secondary | ICD-10-CM | POA: Diagnosis not present

## 2024-03-29 DIAGNOSIS — I10 Essential (primary) hypertension: Secondary | ICD-10-CM | POA: Diagnosis not present

## 2024-03-29 DIAGNOSIS — Z7901 Long term (current) use of anticoagulants: Secondary | ICD-10-CM | POA: Diagnosis not present

## 2024-04-01 DIAGNOSIS — F411 Generalized anxiety disorder: Secondary | ICD-10-CM | POA: Diagnosis not present

## 2024-04-01 DIAGNOSIS — I4892 Unspecified atrial flutter: Secondary | ICD-10-CM | POA: Diagnosis not present

## 2024-04-01 DIAGNOSIS — Z952 Presence of prosthetic heart valve: Secondary | ICD-10-CM | POA: Diagnosis not present

## 2024-04-01 DIAGNOSIS — Z7901 Long term (current) use of anticoagulants: Secondary | ICD-10-CM | POA: Diagnosis not present

## 2024-04-01 DIAGNOSIS — K7460 Unspecified cirrhosis of liver: Secondary | ICD-10-CM | POA: Diagnosis not present

## 2024-04-01 DIAGNOSIS — M5410 Radiculopathy, site unspecified: Secondary | ICD-10-CM | POA: Diagnosis not present

## 2024-04-01 DIAGNOSIS — F32A Depression, unspecified: Secondary | ICD-10-CM | POA: Diagnosis not present

## 2024-04-01 DIAGNOSIS — K219 Gastro-esophageal reflux disease without esophagitis: Secondary | ICD-10-CM | POA: Diagnosis not present

## 2024-04-01 DIAGNOSIS — E119 Type 2 diabetes mellitus without complications: Secondary | ICD-10-CM | POA: Diagnosis not present

## 2024-04-04 DIAGNOSIS — Z952 Presence of prosthetic heart valve: Secondary | ICD-10-CM | POA: Diagnosis not present

## 2024-04-08 DIAGNOSIS — Z952 Presence of prosthetic heart valve: Secondary | ICD-10-CM | POA: Diagnosis not present

## 2024-05-10 DIAGNOSIS — Z952 Presence of prosthetic heart valve: Secondary | ICD-10-CM | POA: Diagnosis not present

## 2024-05-23 DIAGNOSIS — M5137 Other intervertebral disc degeneration, lumbosacral region with discogenic back pain only: Secondary | ICD-10-CM | POA: Diagnosis not present

## 2024-05-23 DIAGNOSIS — H524 Presbyopia: Secondary | ICD-10-CM | POA: Diagnosis not present

## 2024-05-23 DIAGNOSIS — Z7901 Long term (current) use of anticoagulants: Secondary | ICD-10-CM | POA: Diagnosis not present

## 2024-05-23 DIAGNOSIS — R7309 Other abnormal glucose: Secondary | ICD-10-CM | POA: Diagnosis not present

## 2024-05-23 DIAGNOSIS — Z6838 Body mass index (BMI) 38.0-38.9, adult: Secondary | ICD-10-CM | POA: Diagnosis not present

## 2024-05-23 DIAGNOSIS — R937 Abnormal findings on diagnostic imaging of other parts of musculoskeletal system: Secondary | ICD-10-CM | POA: Diagnosis not present

## 2024-05-23 DIAGNOSIS — K7460 Unspecified cirrhosis of liver: Secondary | ICD-10-CM | POA: Diagnosis not present

## 2024-05-23 DIAGNOSIS — F3341 Major depressive disorder, recurrent, in partial remission: Secondary | ICD-10-CM | POA: Diagnosis not present

## 2024-05-23 DIAGNOSIS — I5042 Chronic combined systolic (congestive) and diastolic (congestive) heart failure: Secondary | ICD-10-CM | POA: Diagnosis not present

## 2024-05-23 DIAGNOSIS — H5203 Hypermetropia, bilateral: Secondary | ICD-10-CM | POA: Diagnosis not present

## 2024-05-23 DIAGNOSIS — Z952 Presence of prosthetic heart valve: Secondary | ICD-10-CM | POA: Diagnosis not present

## 2024-05-23 DIAGNOSIS — H52222 Regular astigmatism, left eye: Secondary | ICD-10-CM | POA: Diagnosis not present

## 2024-05-28 DIAGNOSIS — Z6841 Body Mass Index (BMI) 40.0 and over, adult: Secondary | ICD-10-CM | POA: Diagnosis not present

## 2024-05-28 DIAGNOSIS — Z7901 Long term (current) use of anticoagulants: Secondary | ICD-10-CM | POA: Diagnosis not present

## 2024-05-28 DIAGNOSIS — E669 Obesity, unspecified: Secondary | ICD-10-CM | POA: Diagnosis not present

## 2024-05-28 DIAGNOSIS — Z953 Presence of xenogenic heart valve: Secondary | ICD-10-CM | POA: Diagnosis not present

## 2024-05-28 DIAGNOSIS — Z952 Presence of prosthetic heart valve: Secondary | ICD-10-CM | POA: Diagnosis not present

## 2024-05-28 DIAGNOSIS — G4733 Obstructive sleep apnea (adult) (pediatric): Secondary | ICD-10-CM | POA: Diagnosis not present

## 2024-05-28 DIAGNOSIS — E119 Type 2 diabetes mellitus without complications: Secondary | ICD-10-CM | POA: Diagnosis not present

## 2024-05-28 DIAGNOSIS — M5412 Radiculopathy, cervical region: Secondary | ICD-10-CM | POA: Diagnosis not present

## 2024-05-28 DIAGNOSIS — F5104 Psychophysiologic insomnia: Secondary | ICD-10-CM | POA: Diagnosis not present

## 2024-05-31 DIAGNOSIS — H40013 Open angle with borderline findings, low risk, bilateral: Secondary | ICD-10-CM | POA: Diagnosis not present

## 2024-06-05 DIAGNOSIS — H00025 Hordeolum internum left lower eyelid: Secondary | ICD-10-CM | POA: Diagnosis not present

## 2024-06-09 DIAGNOSIS — H00015 Hordeolum externum left lower eyelid: Secondary | ICD-10-CM | POA: Diagnosis not present

## 2024-06-12 DIAGNOSIS — Z954 Presence of other heart-valve replacement: Secondary | ICD-10-CM | POA: Diagnosis not present

## 2024-06-12 DIAGNOSIS — I4892 Unspecified atrial flutter: Secondary | ICD-10-CM | POA: Diagnosis not present

## 2024-06-12 DIAGNOSIS — I471 Supraventricular tachycardia, unspecified: Secondary | ICD-10-CM | POA: Diagnosis not present

## 2024-06-12 DIAGNOSIS — Z952 Presence of prosthetic heart valve: Secondary | ICD-10-CM | POA: Diagnosis not present

## 2024-06-17 DIAGNOSIS — K760 Fatty (change of) liver, not elsewhere classified: Secondary | ICD-10-CM | POA: Diagnosis not present

## 2024-06-18 DIAGNOSIS — Z952 Presence of prosthetic heart valve: Secondary | ICD-10-CM | POA: Diagnosis not present

## 2024-06-18 DIAGNOSIS — R002 Palpitations: Secondary | ICD-10-CM | POA: Diagnosis not present

## 2024-07-04 DIAGNOSIS — R002 Palpitations: Secondary | ICD-10-CM | POA: Diagnosis not present

## 2024-07-16 DIAGNOSIS — Z952 Presence of prosthetic heart valve: Secondary | ICD-10-CM | POA: Diagnosis not present

## 2024-07-31 DIAGNOSIS — I4892 Unspecified atrial flutter: Secondary | ICD-10-CM | POA: Diagnosis not present

## 2024-08-12 DIAGNOSIS — Z5941 Food insecurity: Secondary | ICD-10-CM | POA: Diagnosis not present

## 2024-08-12 DIAGNOSIS — G5601 Carpal tunnel syndrome, right upper limb: Secondary | ICD-10-CM | POA: Diagnosis not present

## 2024-08-12 DIAGNOSIS — Z952 Presence of prosthetic heart valve: Secondary | ICD-10-CM | POA: Diagnosis not present

## 2024-08-12 DIAGNOSIS — M654 Radial styloid tenosynovitis [de Quervain]: Secondary | ICD-10-CM | POA: Diagnosis not present

## 2024-08-23 DIAGNOSIS — Z952 Presence of prosthetic heart valve: Secondary | ICD-10-CM | POA: Diagnosis not present

## 2024-09-03 ENCOUNTER — Other Ambulatory Visit: Payer: Self-pay | Admitting: Surgery

## 2024-09-04 DIAGNOSIS — Z952 Presence of prosthetic heart valve: Secondary | ICD-10-CM | POA: Diagnosis not present

## 2024-09-11 ENCOUNTER — Other Ambulatory Visit

## 2024-09-11 DIAGNOSIS — K760 Fatty (change of) liver, not elsewhere classified: Secondary | ICD-10-CM | POA: Diagnosis not present

## 2024-09-12 ENCOUNTER — Encounter
Admission: RE | Admit: 2024-09-12 | Discharge: 2024-09-12 | Disposition: A | Discharge status: Home / Self Care | Source: Ambulatory Visit | Attending: Surgery | Admitting: Surgery

## 2024-09-12 ENCOUNTER — Other Ambulatory Visit: Payer: Self-pay

## 2024-09-12 VITALS — Wt 222.0 lb

## 2024-09-12 DIAGNOSIS — M79642 Pain in left hand: Secondary | ICD-10-CM

## 2024-09-12 DIAGNOSIS — D649 Anemia, unspecified: Secondary | ICD-10-CM

## 2024-09-12 DIAGNOSIS — Z952 Presence of prosthetic heart valve: Secondary | ICD-10-CM | POA: Diagnosis not present

## 2024-09-12 DIAGNOSIS — E119 Type 2 diabetes mellitus without complications: Secondary | ICD-10-CM

## 2024-09-12 DIAGNOSIS — I10 Essential (primary) hypertension: Secondary | ICD-10-CM

## 2024-09-12 DIAGNOSIS — Z7901 Long term (current) use of anticoagulants: Secondary | ICD-10-CM

## 2024-09-12 DIAGNOSIS — I069 Rheumatic aortic valve disease, unspecified: Secondary | ICD-10-CM

## 2024-09-12 DIAGNOSIS — K7460 Unspecified cirrhosis of liver: Secondary | ICD-10-CM

## 2024-09-12 NOTE — Progress Notes (Signed)
 Documentation for warfarin management Last 3 INR results:  Lab Results  Component Value Date   INR 2.9 (!) 09/12/2024   INR 2.0 (!) 09/04/2024   INR 2.3 (!) 08/23/2024    Dosage adjustment or any change in the treatment regimen today: No.  INR within goal range. Patient reports taking one-time increased warfarin dose as instructed post 09/04/24 sub-therapeutic INR. Patient to start holding warfarin tomorrow, 09/13/24, for 09/18/24 hand surgery. Patient provided with bridging schedule and injectable anticoagulation patient education. Patient advised to restart warfarin per surgical provider instruction and resume at regular warfarin dosing regimen of 46mg  total weekly dose, 8mg  (6mg  x 1,1mg  x 2Tues,Thurs,Sun and 6mg  (5mg  x 1,1mg  x 1)all other days of the week.   Management of result: nurse driven protocol implemented  Patient/caregiver education provided during today's visit: anticoagulant:injectable, healthwise document.  Patient/caregiver did verbalize understanding of the change in dose and any education provided.   The patient/caregiver is instructed to return in two weeks for recheck.  Future Appointments     Date/Time Provider Department Center Visit Type   09/17/2024 8:15 AM Charlene Debby Bruckner, PA [facility] C RETURN VISIT   09/23/2024 9:00 AM [facility] LAB   09/23/2024 12:00 PM [facility] CARDIO NURSE POD B [facility] C NURSE VISIT   09/23/2024 12:00 PM [facility]-ANTICOAG [facility] C ANTI-COAG   09/30/2024 9:15 AM Kip Lynwood Double, PA [facility] C POST-OP   09/30/2024 10:15 AM Hande, Tamra Cal, MD [facility] MARYL BROCKS [facility] OFFICE VISIT   09/30/2024 11:00 AM Alluri, Keller Grist, MD [facility] C FOLLOW UP   10/28/2024 1:15 PM Poggi, Norleen Purchase, MD [facility] C POST-OP   10/30/2024 11:40 AM [facility] - CARDIOLOGY [facility]  MARYL BROCKS FOLLOW UP   12/23/2024 9:20 AM (Arrive by 8:50 AM) Nola Tobias Brunt, MD [facility] RETURN VISIT   01/13/2025 10:00 AM Blitch, Marval Jansky, NP [facility] C TELEPHONE VISIT (CHARGEABLE)   02/25/2025 11:30 AM Blitch, Marval Jansky, NP [facility] C RETURN VISIT

## 2024-09-12 NOTE — Patient Instructions (Addendum)
 Your procedure is scheduled on:09-18-24 Wednesday Report to the Registration Desk on the 1st floor of the Medical Mall.Then proceed to the 2nd floor Surgery Desk To find out your arrival time, please call [phone] between 1PM - 3PM on:09-17-24 Tuesday If your arrival time is 6:00 am, do not arrive before that time as the Medical Mall entrance doors do not open until 6:00 am.  REMEMBER: Instructions that are not followed completely may result in serious medical risk, up to and including death; or upon the discretion of your surgeon and anesthesiologist your surgery may need to be rescheduled.  Do not eat food after midnight the night before surgery.  No gum chewing or hard candies.  You may however, drink Water up to 2 hours before you are scheduled to arrive for your surgery. Do not drink anything within 2 hours of your scheduled arrival time.  In addition, your doctor has ordered for you to drink the provided:  Gatorade G2 Drinking this carbohydrate drink up to two hours before surgery helps to reduce insulin  resistance and improve patient outcomes. Please complete drinking 2 hours before scheduled arrival time.  One week prior to surgery:Stop NOW (09-12-24) Stop Anti-inflammatories (NSAIDS) such as Advil, Aleve, Ibuprofen, Motrin, Naproxen, Naprosyn and Aspirin  based products such as Excedrin, Goody's Powder, BC Powder. Stop ANY OVER THE COUNTER supplements until after surgery (Cranberry-Vitamin C, Nutrafol)-Continue your Iron  since this is a prescription  You may however, continue to take Tylenol /Hydrocodone  if needed for pain up until the day of surgery.  Stop warfarin (COUMADIN ) 5 days prior to surgery-Last dose will be today 09-12-24 Thursday   Stop tirzepatide [facility]) 7 days prior to surgery-Do NOT take again until AFTER your surgery  Continue taking all of your other prescription medications up until the day of surgery.  ON THE DAY OF SURGERY ONLY TAKE THESE MEDICATIONS  WITH SIPS OF WATER: -lansoprazole (PREVACID) -pregabalin  (LYRICA )   Continue your 81 mg Aspirin  up until the day prior to surgery-Do NOT take the morning of surgery  Use your Albuterol  Nebulizer the day of surgery and bring your Albuterol  Inhaler to the hospital  No Alcohol for 24 hours before or after surgery.  No Smoking including e-cigarettes for 24 hours before surgery.  No chewable tobacco products for at least 6 hours before surgery.  No nicotine patches on the day of surgery.  Do not use any recreational drugs for at least a week (preferably 2 weeks) before your surgery.  Please be advised that the combination of cocaine and anesthesia may have negative outcomes, up to and including death. If you test positive for cocaine, your surgery will be cancelled.  On the morning of surgery brush your teeth with toothpaste and water, you may rinse your mouth with mouthwash if you wish. Do not swallow any toothpaste or mouthwash.  Use CHG Soap as directed on instruction sheet.  Do not wear jewelry, make-up, hairpins, clips or nail polish.  For welded (permanent) jewelry: bracelets, anklets, waist bands, etc.  Please have this removed prior to surgery.  If it is not removed, there is a chance that hospital personnel will need to cut it off on the day of surgery.  Do not wear lotions, powders, or perfumes.   Do not shave body hair from the neck down 48 hours before surgery.  Contact lenses, hearing aids and dentures may not be worn into surgery.  Do not bring valuables to the hospital. [facility] is not responsible for any missing/lost belongings  or valuables.   Bring your C-PAP to the hospital   Notify your doctor if there is any change in your medical condition (cold, fever, infection).  Wear comfortable clothing (specific to your surgery type) to the hospital.  After surgery, you can help prevent lung complications by doing breathing exercises.  Take deep breaths and cough  every 1-2 hours. Your doctor may order a device called an Incentive Spirometer to help you take deep breaths. When coughing or sneezing, hold a pillow firmly against your incision with both hands. This is called "splinting." Doing this helps protect your incision. It also decreases belly discomfort.  If you are being admitted to the hospital overnight, leave your suitcase in the car. After surgery it may be brought to your room.  In case of increased patient census, it may be necessary for you, the patient, to continue your postoperative care in the Same Day Surgery department.  If you are being discharged the day of surgery, you will not be allowed to drive home. You will need a responsible individual to drive you home and stay with you for 24 hours after surgery.   If you are taking public transportation, you will need to have a responsible individual with you.  Please call the Pre-admissions Testing Dept. at [phone] if you have any questions about these instructions.  Surgery Visitation Policy:  Patients having surgery or a procedure may have two visitors.  Children under the age of 9 must have an adult with them who is not the patient.                                                                                                             Preparing for Surgery with CHLORHEXIDINE  GLUCONATE (CHG) Soap  Chlorhexidine  Gluconate (CHG) Soap  o An antiseptic cleaner that kills germs and bonds with the skin to continue killing germs even after washing  o Used for showering the night before surgery and morning of surgery  Before surgery, you can play an important role by reducing the number of germs on your skin.  CHG (Chlorhexidine  gluconate) soap is an antiseptic cleanser which kills germs and bonds with the skin to continue killing germs even after washing.  Please do not use if you have an allergy to CHG or antibacterial soaps. If your skin becomes reddened/irritated stop  using the CHG.  1. Shower the NIGHT BEFORE SURGERY with CHG soap.  2. If you choose to wash your hair, wash your hair first as usual with your normal shampoo.  3. After shampooing, rinse your hair and body thoroughly to remove the shampoo.  4. Use CHG as you would any other liquid soap. You can apply CHG directly to the skin and wash gently with a clean washcloth.  5. Apply the CHG soap to your body only from the neck down. Do not use on open wounds or open sores. Avoid contact with your eyes, ears, mouth, and genitals (private parts). Wash face and genitals (private parts) with your normal soap.  6.  Wash thoroughly, paying special attention to the area where your surgery will be performed.  7. Thoroughly rinse your body with warm water.  8. Do not shower/wash with your normal soap after using and rinsing off the CHG soap.  9. Do not use lotions, oils, etc., after showering with CHG.  10. Pat yourself dry with a clean towel.  11. Wear clean pajamas to bed the night before surgery.  12. Place clean sheets on your bed the night of your shower and do not sleep with pets.  13. Do not apply any deodorants/lotions/powders.  14. Please wear clean clothes to the hospital.  15. Remember to brush your teeth with your regular toothpaste.   Merchandiser, retail to address health-related social needs:  [email or web address]

## 2024-09-13 ENCOUNTER — Encounter
Admission: RE | Admit: 2024-09-13 | Discharge: 2024-09-13 | Disposition: A | Discharge status: Home / Self Care | Source: Ambulatory Visit | Attending: Surgery | Admitting: Surgery

## 2024-09-13 ENCOUNTER — Encounter: Payer: Self-pay | Admitting: Surgery

## 2024-09-13 DIAGNOSIS — D649 Anemia, unspecified: Secondary | ICD-10-CM | POA: Insufficient documentation

## 2024-09-13 DIAGNOSIS — E119 Type 2 diabetes mellitus without complications: Secondary | ICD-10-CM | POA: Diagnosis not present

## 2024-09-13 DIAGNOSIS — K7460 Unspecified cirrhosis of liver: Secondary | ICD-10-CM | POA: Diagnosis not present

## 2024-09-13 DIAGNOSIS — Z01818 Encounter for other preprocedural examination: Secondary | ICD-10-CM | POA: Diagnosis present

## 2024-09-13 DIAGNOSIS — Z7901 Long term (current) use of anticoagulants: Secondary | ICD-10-CM | POA: Diagnosis not present

## 2024-09-13 DIAGNOSIS — Z01812 Encounter for preprocedural laboratory examination: Secondary | ICD-10-CM | POA: Insufficient documentation

## 2024-09-13 DIAGNOSIS — I10 Essential (primary) hypertension: Secondary | ICD-10-CM | POA: Insufficient documentation

## 2024-09-13 DIAGNOSIS — G5600 Carpal tunnel syndrome, unspecified upper limb: Secondary | ICD-10-CM

## 2024-09-13 DIAGNOSIS — I069 Rheumatic aortic valve disease, unspecified: Secondary | ICD-10-CM | POA: Diagnosis not present

## 2024-09-13 LAB — CBC
HCT: 45.1 % (ref 36.0–46.0)
Hemoglobin: 14.4 g/dL (ref 12.0–15.0)
MCH: 29.3 pg (ref 26.0–34.0)
MCHC: 31.9 g/dL (ref 30.0–36.0)
MCV: 91.9 fL (ref 80.0–100.0)
Platelets: 240 K/uL (ref 150–400)
RBC: 4.91 MIL/uL (ref 3.87–5.11)
RDW: 15.2 % (ref 11.5–15.5)
WBC: 5.3 K/uL (ref 4.0–10.5)
nRBC: 0 % (ref 0.0–0.2)

## 2024-09-13 LAB — COMPREHENSIVE METABOLIC PANEL WITH GFR
ALT: 16 U/L (ref 0–44)
AST: 29 U/L (ref 15–41)
Albumin: 4.3 g/dL (ref 3.5–5.0)
Alkaline Phosphatase: 85 U/L (ref 38–126)
Anion gap: 11 (ref 5–15)
BUN: 8 mg/dL (ref 6–20)
CO2: 22 mmol/L (ref 22–32)
Calcium: 9.5 mg/dL (ref 8.9–10.3)
Chloride: 106 mmol/L (ref 98–111)
Creatinine, Ser: 0.69 mg/dL (ref 0.44–1.00)
GFR, Estimated: >60 mL/min (ref >60)
Glucose, Bld: 81 mg/dL (ref 70–99)
Potassium: 3.6 mmol/L (ref 3.5–5.1)
Sodium: 139 mmol/L (ref 135–145)
Total Bilirubin: 0.7 mg/dL (ref 0.0–1.2)
Total Protein: 9.6 g/dL — ABNORMAL HIGH (ref 6.5–8.1)

## 2024-09-13 NOTE — Progress Notes (Addendum)
 Perioperative / Anesthesia Services  Pre-Admission Testing Clinical Review / Pre-Operative Anesthesia Consult  Date: 09/13/24  PATIENT DEMOGRAPHICS: Name: Shelly Huynh DOB: 27-Oct-1971 MRN:   [id number]  Note: Available PAT nursing documentation and vital signs have been reviewed. Clinical nursing staff has updated patient's PMH/PSHx, current medication list, and drug allergies/intolerances to ensure complete and comprehensive history available to assist care teams in MDM as it pertains to the aforementioned surgical procedure and anticipated anesthetic course. Extensive review of available clinical information personally performed. Nursing documentation reviewed. [facility] and PSHx updated with any diagnoses and/or procedures that I have knowledge of that may have been inadvertently omitted during her intake with the pre-admission testing department's nursing staff.  PLANNED SURGICAL PROCEDURE(S):   Case: [id number] Date/Time: 09/18/24 0930   Procedure: RELEASE, FIRST DORSAL COMPARTMENT, HAND (Left: Hand)   Anesthesia type: Choice   Diagnosis:      Right carpal tunnel syndrome [G56.01]     De Quervain's tenosynovitis [M65.4]   Pre-op diagnosis:      Right carpal tunnel syndrome G56.01     De Quervain's tenosynovitis M65.4   Location: [facility] OR ROOM 02 / [facility]   Surgeons: Edie Norleen PARAS, MD        CLINICAL DISCUSSION: MYRISSA Huynh is a 53 y.o. female who is submitted for pre-surgical anesthesia review and clearance prior to her undergoing the above procedure. Patient is a Current Smoker (12.5 pack years). Pertinent PMH includes: CAD, HFpEF, valvular cardiomyopathy (s/p AVR/MVR/TVR), cardiomegaly, paroxysmal atrial flutter, palpitations, aortic atherosclerosis, HTN, HLD, T2DM, DOE, asthma, emphysema, GERD (no daily Tx), hiatal hernia, OSAH (requires nocturnal PAP therapy), anemia, hyperaldosteronism, cirrhosis/NASH, recurrent headaches, cervical DDD,  lumbar facet syndrome, chronic lower back pain with sciatica, OA, carpal tunnel syndrome, depression, anxiety (on BZO), depression, mild cognitive impairment (? early dementia).   Patient is followed by cardiology Jodeen, MD) and electrophysiology Celinda, MD). She was last seen in the cardiology clinic on 07/31/2024; notes reviewed. At the time of her clinic visit, patient doing well overall from a cardiovascular perspective.  Patient reporting a sense of tachycardia when supine and episodes of minimal exertion.  Palpitations associated with shortness of breath.  She reports a fluttering sensation and often feels dizzy.  Patient denied any chest pain, PND, orthopnea, significant peripheral edema, weakness, fatigue,  or presyncope/syncope. Patient with a past medical history significant for cardiovascular diagnoses. Documented physical exam was grossly benign, providing no evidence of acute exacerbation and/or decompensation of the patient's known cardiovascular conditions.  Diagnostic LEFT heart catheterization performed on 12/05/2014 here at [facility] revealed normal left ventricular systolic function.  There was normal coronary anatomy with no evidence of obstructive CAD.   Patient has a history of valvular cardiomyopathy.  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  Patient ultimately underwent replacement of her aortic, mitral, and tricuspid valves on 04/03/2015.   19 mm St. Jude mechanical aortic valve 25 mm St. Jude mechanical mitral valve 21 mm Mosaic porcine tricuspid valve   Most recent myocardial perfusion imaging study was performed on 04/13/2022 revealing a normal left ventricular systolic function with an EF of 64%.  There was normal regional wall motion.  There was no evidence of stress-induced myocardial ischemia or arrhythmia; no scintigraphic evidence of scar.  Study determined to be  normal and low risk overall.   Most recent TTE performed on 03/12/2024 revealed a normal left ventricular systolic function  with an EF of greater than 55%. There was moderate LVH.  There were no regional wall motion abnormalities.  Left ventricular diastolic Doppler parameters were indeterminate.  Right ventricular size and function normal with a TAPSE measuring 1.5 cm  (normal range >/= 1.6 cm).  RVSP = 25 mmHg. There was trivial to mild mitral, pulmonic and tricuspid valve regurgitation.bioprosthetic mitral, aortic, and tricuspid valves noted to be well-seated and functioning properly.  Mean transvalvular mitral valve gradient = 3 mmHg.  Mean transaortic valve gradient = 27 mmHg; peak velocity = 3.3 m/s, DI = 0.33).  Mean tricuspid transvalvular gradient = 4 mmHg. Aorta normal in size with no evidence of ectasia or aneurysmal dilatation.  Following valve replacements, patient remains on daily oral anticoagulation therapy using warfarin.  Patient with an atrial fibrillation diagnosis; CHA2DS2-VASc Score = 5 (sex, HFpEF, HTN, vascular disease, T2DM). Patient underwent radiofrequency ablation procedure on 02/13/2024 at [facility].  Post procedurally, cardiac rate/rhythm is being maintained on oral metoprolol  succinate.  Given her recent ablation and following valve replacements, patient remains on daily oral anticoagulation therapy using warfarin.  Patient reported be compliant with therapy with no evidence or reports of GI/GU related bleeding. Blood pressure well controlled at 104/80 mmHg on currently prescribed ARB (candesartan), beta-blocker (metoprolol  succinate), and MRA (spironolactone ) therapies.  Patient is on pravastatin  for her HLD diagnosis and ASCVD prevention.  T2DM well-controlled on currently prescribed regimen; last HgbA1c was 5.8% when checked on 05/23/2024. Patient does have an OSAH diagnosis and is reported to be compliant with prescribed nocturnal PAP therapy.  Functional  capacity limited by obesity and multiple medical comorbidities.  With that being said, patient is able to complete all of her ADLs/IADLs without cardiovascular limitation.  Per the DASI, patient felt to be able to achieve at least 4 METS of physical activity without experiencing any degree of angina/anginal equivalent symptoms.  Beta-blocker dose increased to 37.5 mg daily and MRS dose increased to 50 mg daily. No other changes were made to her medication regimen. Patient to follow-up with outpatient cardiology in 6 months or sooner if needed.   Lawayne VEAR Range is scheduled for an elective RELEASE, FIRST DORSAL COMPARTMENT, HAND (Left: Hand) on 09/18/2024 with Dr. Norleen JINNY Maltos, MD. Given patient's past medical history significant for cardiovascular diagnoses, presurgical cardiac clearance was sought by the PAT team. Per cardiology, this patient is optimized for surgery and may proceed with the planned procedural course with a MODERATE risk of significant perioperative cardiovascular complications.  Again, this patient is on daily oral anticoagulation therapy. She has been instructed on recommendations for holding her WARFARIN for 5 days prior to her procedure with plans to restart as soon as postoperative bleeding risk felt to be minimized by his primary attending surgeon. The patient has been instructed that her last dose should be on 09/12/2024. Patient will require enoxaparin  bridging surrounding this procedure. Instructions for medication bridge have been supplied to patient by her cardiologist's office. Given that patient's past medical history is significant for cardiovascular diagnoses, including but not limited to CAD, orthopedics has cleared patient to continue her daily low dose ASA throughout her perioperative course. She will be asked to hold her normal dose on the day of her procedure only. Patient has been updated on these directives from her specialty care providers by the PAT team.  Patient  reports previous perioperative complications with anesthesia in the past.  Patient experience (+) apnea/hypoxia during cardiac ablation procedure. Patient ultimately required endotracheal intubation  which was reported to be uneventful. In review her EMR, it is noted that patient underwent a general anesthetic course at [facility] (ASA III) in 03/2024 with no documented complications.   MOST RECENT VITAL SIGNS:    09/12/2024    2:00 PM 03/27/2024   11:53 AM 03/27/2024   11:45 AM  Vitals with BMI  Weight 222 lbs    Systolic  131 103  Diastolic  79 67  Pulse  81 80   PROVIDERS/SPECIALISTS: NOTE: Primary physician provider listed below. Patient may have been seen by APP or partner within same practice.   PROVIDER ROLE / SPECIALTY LAST OV  Poggi, Norleen PARAS, MD Orthopedics (Surgeon) 08/12/2024  Sadie Manna, MD Primary Care Provider 05/28/2024  Wilburn Fillers, MD Cardiology 03/29/2024  Melchor Agent, MD Electrophysiology 07/31/2024  Avanell Katz, MD Physiatry 02/08/2024  Maree Hila, MD Neurology 02/23/2023  Parris Manna, MD Pulmonary Medicine 01/17/2024   ALLERGIES: Allergies  Allergen Reactions   Ivp Dye [Iodinated Contrast Media] Anaphylaxis and Hives    Especially CT contrast media; tightness in chest   Sulfa Antibiotics Hives and Anaphylaxis   Banana Itching   Hctz [Hydrochlorothiazide] Hives   Keflex [Cephalexin] Other (See Comments)    Abdominal pain Tolerated 1st (CEFAZOLIN ) and 2nd (CEFUROXIME) generation cephalosporins  on multiple occasions without documented ADRs.   Losartan Itching   Povidone Iodine Hives   Zofran  [Ondansetron ] Itching   Lisinopril Hives and Rash   Mucinex [Guaifenesin Er] Hives and Rash    CURRENT HOME MEDICATIONS: No current facility-administered medications for this encounter.    albuterol  (PROVENTIL ) (2.5 MG/3ML) 0.083% nebulizer solution   albuterol  (VENTOLIN  HFA) 108 (90 Base) MCG/ACT inhaler   aspirin  EC 81  MG tablet   candesartan (ATACAND) 8 MG tablet   Cranberry-Vitamin C-Vitamin E (CRANBERRY PLUS VITAMIN C PO)   fluticasone  (FLONASE ) 50 MCG/ACT nasal spray   HYDROcodone -acetaminophen  (NORCO/VICODIN) 5-325 MG tablet   iron  polysaccharides (NIFEREX) 150 MG capsule   lansoprazole (PREVACID) 30 MG capsule   levalbuterol (XOPENEX) 1.25 MG/3ML nebulizer solution   levocetirizine (XYZAL ) 5 MG tablet   metoprolol  succinate (TOPROL -XL) 25 MG 24 hr tablet   montelukast  (SINGULAIR ) 10 MG tablet   pravastatin  (PRAVACHOL ) 20 MG tablet   pregabalin  (LYRICA ) 75 MG capsule   spironolactone  (ALDACTONE ) 50 MG tablet   tirzepatide (MOUNJARO) 7.5 MG/0.5ML Pen   tiZANidine (ZANAFLEX) 4 MG tablet   Vitamin D, Ergocalciferol, (DRISDOL) 1.25 MG (50000 UNIT) CAPS capsule   warfarin (COUMADIN ) 1 MG tablet   warfarin (COUMADIN ) 6 MG tablet   OVER THE COUNTER MEDICATION   HISTORY: Past Medical History:  Diagnosis Date   (HFpEF) heart failure with preserved ejection fraction (HCC)    Anemia    Anticoagulated on warfarin    Anxiety    a.) on BZO (diazepam ) PRN   Aortic atherosclerosis    Aortic valve stenosis and insufficiency, rheumatic    a.) s/p AVR 04/03/2015 - 19 mm St. Jude mechanical valve   Arthritis    Asthma    ATN (acute tubular necrosis)    Cardiomegaly    Carpal tunnel syndrome    Chronic constipation    a.) on linaclotide    Chronic lower back pain    Cirrhosis (HCC)    Complication of anesthesia 02/13/2024   a.) apnea/hypoxia during cardiac ablation on 02/13/2024 at [facility] --> required intubation   Coronary artery calcification seen on CT scan    a.) LHC 12/05/2014: normal coronaries   DDD (degenerative  disc disease), cervical    Depression    DOE (dyspnea on exertion)    Emphysema of lung (HCC)    GERD (gastroesophageal reflux disease)    Helicobacter pylori infection    Hepatic cysts (scattered)    History of hiatal hernia    History of methicillin resistant staphylococcus aureus  (MRSA)    HTN (hypertension)    Hyperaldosteronism    Hyperlipidemia    Hypertension    IBS (irritable bowel syndrome)    Lumbar facet joint syndrome    Migraines    Mild cognitive impairment    a,) on NMDA receptor antagonist (memantine)   Mitral stenosis with insufficiency, rheumatic    a.) s/p MVR 04/03/2015 - 25 mm St. Jude mechanical valve   Moderate tricuspid regurgitation    a.) s/p TVR 04/03/2015 - 21 mm Mosaic porcine valve   NASH (nonalcoholic steatohepatitis)    Obesity    OSA on CPAP    Palpitations    Paroxysmal atrial flutter (HCC)    a.) CHA2DS2VASc = 5 (sex, CHF, HTN, vascular disease history, T2DM) as of 03/22/2024; b.) s/p RFA 02/13/2024 at [facility]; c.) rate/rhythm maintained on oral metoprolol  succinate; chronically anticoagulated with warfarin   Sciatic pain    T2DM (type 2 diabetes mellitus) (HCC)    Umbilical hernia    a.) s/p repair 09/2004   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HCC)    a.) s/p aortic, mitral, and tricuspid valve replacements 04/03/2015   Vitamin D deficiency    Past Surgical History:  Procedure Laterality Date   ABDOMINAL HYSTERECTOMY  2010   AORTIC VALVE REPLACEMENT  04/03/2015   Procedure: AORTIC VALVE REPLACEMENT (19 mm St. Jude Regent mechanical valve); Location: [facility]; Surgeon: Alm Schwab, MD   ARTERY BIOPSY N/A 10/03/2022   Procedure: BIOPSY TEMPORAL ARTERY;  Surgeon: Rodolph Romano, MD;  Location: [facility] ORS;  Service: General;  Laterality: N/A;   BREAST BIOPSY Left 07/25/2017   BENIGN BREAST TISSUE WITH MICROCALCIFICATIONS   BREAST BIOPSY Right 10/22/2019   Affirm Bx- X-clip- CSL, no atypia   BREAST BIOPSY Right 10/22/2019   Affirm Bx- Ribbon clip- CSL, no atypia   BREAST BIOPSY Right 11/20/2019   Procedure: BREAST BIOPSY WITH NEEDLE LOCALIZATION x 2;  Surgeon: Rodolph Romano, MD;  Location: [facility] ORS;  Service: General;  Laterality: Right;   BREAST EXCISIONAL BIOPSY Right 1992   NEG   BREAST LUMPECTOMY Right 11/20/2019    complex sclerosing lesion x 2   CARPAL TUNNEL RELEASE Right 03/27/2024   Procedure: RELEASE, CARPAL TUNNEL, ENDOSCOPIC;  Surgeon: Edie Norleen PARAS, MD;  Location: [facility] ORS;  Service: Orthopedics;  Laterality: Right;   CESAREAN SECTION N/A 2000   CESAREAN SECTION N/A 1987   CHOLECYSTECTOMY  07/2020   COLONOSCOPY WITH PROPOFOL  N/A 09/21/2020   Procedure: COLONOSCOPY WITH PROPOFOL ;  Surgeon: Toledo, Ladell POUR, MD;  Location: [facility] END[facility];  Service: Gastroenterology;  Laterality: N/A;   ESOPHAGOGASTRODUODENOSCOPY (EGD) WITH PROPOFOL  N/A 10/07/2016   Procedure: ESOPHAGOGASTRODUODENOSCOPY (EGD) WITH PROPOFOL ;  Surgeon: Ruel Kung, MD;  Location: [facility];  Service: Endoscopy;  Laterality: N/A;   ESOPHAGOGASTRODUODENOSCOPY (EGD) WITH PROPOFOL  N/A 09/21/2020   Procedure: ESOPHAGOGASTRODUODENOSCOPY (EGD) WITH PROPOFOL ;  Surgeon: Toledo, Ladell POUR, MD;  Location: [facility];  Service: Gastroenterology;  Laterality: N/A;   HIATAL HERNIA REPAIR     LAPAROSCOPIC GASTRIC BAND REMOVAL WITH LAPAROSCOPIC GASTRIC SLEEVE RESECTION     LEFT HEART CATH AND CORONARY ANGIOGRAPHY Left 12/05/2014   Procedure: LEFT HEART CATH AND CORONARY ANGIOGRAPHY; Location: [facility]; Surgeon: Wolm  Hester, MD   LIPOSUCTION TRUNK  1996   MITRAL VALVE REPLACEMENT  04/03/2015   Procedure: MITRAL VALVE REPLACEMENT (25 mm St. Jude Mechanical valve); Location: [facility]; Surgeon: Alm Schwab, MD   TRICUSPID VALVE REPLACEMENT  04/03/2015   Procedure: TRICUSPID VALVE REPLACEMENT (21 mm Mosaic porcine valve); Location: [facility]; Surgeon: Alm Schwab, MD   TUBAL LIGATION  2000   UMBILICAL HERNIA REPAIR  2005   VENTRAL HERNIA REPAIR     Family History  Problem Relation Age of Onset   Thyroid  cancer Mother    Uterine cancer Mother    Breast cancer Paternal Grandmother    Lung cancer Paternal Grandmother    Pancreatic cancer Cousin    Social History   Tobacco Use   Smoking status: Every Day    Current packs/day: 0.50    Average  packs/day: 0.5 packs/day for 25.0 years (12.5 ttl pk-yrs)    Types: Cigarettes   Smokeless tobacco: Never  Substance Use Topics   Alcohol use: No   LABS:  Hospital Outpatient Visit on 09/13/2024  Component Date Value Ref Range Status   WBC 09/13/2024 5.3  4.0 - 10.5 K/uL Final   RBC 09/13/2024 4.91  3.87 - 5.11 MIL/uL Final   Hemoglobin 09/13/2024 14.4  12.0 - 15.0 g/dL Final   HCT 89/75/7974 45.1  36.0 - 46.0 % Final   MCV 09/13/2024 91.9  80.0 - 100.0 fL Final   MCH 09/13/2024 29.3  26.0 - 34.0 pg Final   MCHC 09/13/2024 31.9  30.0 - 36.0 g/dL Final   RDW 89/75/7974 15.2  11.5 - 15.5 % Final   Platelets 09/13/2024 240  150 - 400 K/uL Final   nRBC 09/13/2024 0.0  0.0 - 0.2 % Final   Performed at [facility], [address] [place], [place] [address]   Sodium 09/13/2024 139  135 - 145 mmol/L Final   Potassium 09/13/2024 3.6  3.5 - 5.1 mmol/L Final   Chloride 09/13/2024 106  98 - 111 mmol/L Final   CO2 09/13/2024 22  22 - 32 mmol/L Final   Glucose, Bld 09/13/2024 81  70 - 99 mg/dL Final   Glucose reference range applies only to samples taken after fasting for at least 8 hours.   BUN 09/13/2024 8  6 - 20 mg/dL Final   Creatinine, Ser 09/13/2024 0.69  0.44 - 1.00 mg/dL Final   Calcium 89/75/7974 9.5  8.9 - 10.3 mg/dL Final   Total Protein 89/75/7974 9.6 (H)  6.5 - 8.1 g/dL Final   Albumin 89/75/7974 4.3  3.5 - 5.0 g/dL Final   AST 89/75/7974 29  15 - 41 U/L Final   ALT 09/13/2024 16  0 - 44 U/L Final   Alkaline Phosphatase 09/13/2024 85  38 - 126 U/L Final   Total Bilirubin 09/13/2024 0.7  0.0 - 1.2 mg/dL Final   GFR, Estimated 09/13/2024 >60  >60 mL/min Final   Comment: (NOTE) Calculated using the CKD-EPI Creatinine Equation (2021)    Anion gap 09/13/2024 11  5 - 15 Final   Performed at [facility], [address] [place], [place] [address]    ECG: Date: 07/31/2024  Time ECG obtained: 1504 PM Rate: 69 bpm Rhythm: normal sinus Axis (leads I and  aVF): normal Intervals: PR 150 ms. QRS 102 ms. QTc 480 ms. ST segment and T wave changes: Nonspecific ST/T wave abnormalities Evidence of a possible, age undetermined, prior infarct:  Yes; anterior Comparison: Similar to previous tracing obtained on 06/12/2024  NOTE: Tracing obtained at [facility]; unable for review. Above based on cardiologist's interpretation.    IMAGING / PROCEDURES: MRI ABDOMEN WITH AND WITHOUT CONTRAST LIVER MR ELASTOGRAPHY performed on 09/11/2024 No evidence of HCC.  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   LONG TERM CARDIAC EVENT MONITOR STUDY performed on  07/04/2024 7 day Holter monitor from 06/18/2024 to 06/25/2024 Predominant rhythm is sinus rhythm with frequent supraventricular ectopy.. Maximum heart rate 154 bpm, minimum heart rate 38 bpm the average heart rate of 67 bpm Frequent 5% PACs and occasional 1% PVCs noted Frequent, 142 occurrences of short lasting supraventricular tachycardia, longest episode 16 seconds 1 episode of very short lasting nonsustained ventricular tachycardia for 3 beats 1 patient trigger associated with supraventricular ectopy  MRI ABDOMEN WITH AND WITHOUT CONTRAST performed on 02/22/2024 Nodular liver contour without suspicious hepatic lesions.  No suspicious lesions. Unchanged scattered hepatic cysts.  Gallbladder is surgically absent. No intra or extrahepatic biliary ductal dilatation.    TRANSTHORACIC ECHOCARDIOGRAM performed on 03/12/2024 Normal left ventricular systolic function with an EF of >55%. Moderate LVH Indeterminate diastolic function Left atrium severely enlarged Normal right ventricular size and function with a TAPSE measuring 1.5 cm Trivial MR and PR Mild TR Mechanical mitral valve replacement 2016. Mean gradient 3 mmHg at HR 52 bpm  Mechanical aortic valve replacement 2016 with mildly elevated gradients unchanged from prior echo (peak velocity 3.3 m/s, mean gradient 27  mmHg, DI 0.33).  Bioprosthetic tricuspid valve replacement, mean gradient 4 mmHg at HR 52 bpm    MR LUMBAR SPINE WO CONTRAST performed on 02/04/2024 Dominant lumbar degenerative finding is facet arthropathy, with superimposed generally mild lower lumbar disc bulging and endplate spurring. No spinal or lateral recess specks that no significant spinal or lateral recess stenosis. However, an L3-L4 left-side posterior 8 mm degenerative synovial cyst is new since a 2021 MRI, and might be a source for left side radiculitis. Otherwise moderate to severe chronic neural foraminal stenosis at the bilateral L5 nerve levels, moderate left T11 nerve level foraminal stenosis. And mild left L3 and bilateral L4 nerve level foraminal stenosis.   MR CERVICAL SPINE WO CONTRAST performed on 01/06/2024 Mild degenerative changes of the cervical spine. No significant central canal or neural foraminal narrowing.   CT CHEST LCS NODULE F/U LOW DOSE WO CONTRAST performed on 08/23/2023 Lung-RADS 2, benign appearance or behavior. Continue annual screening with low-dose chest CT without contrast in 12 months. Subtle nodularity of liver contour suggests cirrhosis. Aortic atherosclerosis  Emphysema   MYOCARDIAL PERFUSION IMAGING STUDY (LEXISCAN) performed on 04/13/2022 Normal left ventricular systolic function with a normal LVEF of 64% Normal myocardial thickening and wall motion Left ventricular cavity size normal SPECT images demonstrate homogenous tracer distribution throughout the myocardium No evidence of stress-induced myocardial ischemia or arrhythmia Normal low risk study   CT ABDOMEN PELVIS WO CONTRAST performed on 07/12/2022 There is no evidence of intestinal obstruction or pneumoperitoneum. There is no hydronephrosis.  Appendix is not dilated. There are few small low-density foci in liver, possibly cysts or hemangiomas.  Nodularity in the liver surface suggests possible cirrhosis.   IMPRESSION AND  PLAN: ALFRIEDA TARRY has been referred for pre-anesthesia review and clearance prior to her undergoing the planned anesthetic and procedural courses. Available labs, pertinent testing, and imaging results were personally reviewed by me in preparation for upcoming operative/procedural course. [facility] medical record has been updated following extensive record review and patient interview with PAT staff.   This  patient has been appropriately cleared by cardiology with an overall MODERATE risk of patient experiencing significant perioperative cardiovascular complications. Based on clinical review performed today (09/13/24), barring any significant acute changes in the patient's overall condition, it is anticipated that she will be able to proceed with the planned surgical intervention. Any acute changes in clinical condition may necessitate her procedure being postponed and/or cancelled. Patient will meet with anesthesia team (MD and/or CRNA) on the day of her procedure for preoperative evaluation/assessment. Questions regarding anesthetic course will be fielded at that time.   Pre-surgical instructions were reviewed with the patient during his PAT appointment, and questions were fielded to satisfaction by PAT clinical staff. She has been instructed on which medications that she will need to hold prior to surgery, as well as the ones that have been deemed safe/appropriate to take on the day of her procedure. As part of the general education provided by PAT, patient made aware both verbally and in writing, that she would need to abstain from the use of any illegal substances during her perioperative course. She was advised that failure to follow the provided instructions could necessitate case cancellation or result in serious perioperative complications up to and including death. Patient encouraged to contact PAT and/or her surgeon's office to discuss any questions or concerns that may arise prior to  surgery; verbalized understanding.   Dorise Pereyra, MSN, APRN, FNP-C, CEN [facility]  Perioperative Services Nurse Practitioner Phone: [phone] Fax: [phone] 09/13/24 10:58 AM  NOTE: This note has been prepared using Dragon dictation software. Despite my best ability to proofread, there is always the potential that unintentional transcriptional errors may still occur from this process.

## 2024-09-16 ENCOUNTER — Inpatient Hospital Stay: Admission: RE | Admit: 2024-09-16 | Source: Ambulatory Visit

## 2024-09-17 ENCOUNTER — Other Ambulatory Visit

## 2024-09-17 DIAGNOSIS — M654 Radial styloid tenosynovitis [de Quervain]: Secondary | ICD-10-CM | POA: Diagnosis not present

## 2024-09-18 ENCOUNTER — Ambulatory Visit
Admission: RE | Admit: 2024-09-18 | Discharge: 2024-09-18 | Disposition: A | Discharge status: Home / Self Care | Attending: Surgery | Admitting: Surgery

## 2024-09-18 ENCOUNTER — Encounter: Admission: RE | Disposition: A | Payer: Self-pay | Source: Home / Self Care | Attending: Surgery

## 2024-09-18 ENCOUNTER — Ambulatory Visit: Payer: Self-pay | Admitting: Urgent Care

## 2024-09-18 ENCOUNTER — Encounter: Payer: Self-pay | Admitting: Surgery

## 2024-09-18 ENCOUNTER — Other Ambulatory Visit: Payer: Self-pay

## 2024-09-18 DIAGNOSIS — Z01818 Encounter for other preprocedural examination: Secondary | ICD-10-CM

## 2024-09-18 DIAGNOSIS — K7460 Unspecified cirrhosis of liver: Secondary | ICD-10-CM | POA: Insufficient documentation

## 2024-09-18 DIAGNOSIS — I110 Hypertensive heart disease with heart failure: Secondary | ICD-10-CM | POA: Insufficient documentation

## 2024-09-18 DIAGNOSIS — K219 Gastro-esophageal reflux disease without esophagitis: Secondary | ICD-10-CM | POA: Diagnosis not present

## 2024-09-18 DIAGNOSIS — D649 Anemia, unspecified: Secondary | ICD-10-CM | POA: Diagnosis not present

## 2024-09-18 DIAGNOSIS — I4891 Unspecified atrial fibrillation: Secondary | ICD-10-CM | POA: Diagnosis not present

## 2024-09-18 DIAGNOSIS — I350 Nonrheumatic aortic (valve) stenosis: Secondary | ICD-10-CM | POA: Diagnosis not present

## 2024-09-18 DIAGNOSIS — I5032 Chronic diastolic (congestive) heart failure: Secondary | ICD-10-CM | POA: Insufficient documentation

## 2024-09-18 DIAGNOSIS — E119 Type 2 diabetes mellitus without complications: Secondary | ICD-10-CM | POA: Diagnosis not present

## 2024-09-18 DIAGNOSIS — Z87891 Personal history of nicotine dependence: Secondary | ICD-10-CM | POA: Diagnosis not present

## 2024-09-18 DIAGNOSIS — Z7982 Long term (current) use of aspirin: Secondary | ICD-10-CM | POA: Diagnosis not present

## 2024-09-18 DIAGNOSIS — F419 Anxiety disorder, unspecified: Secondary | ICD-10-CM | POA: Insufficient documentation

## 2024-09-18 DIAGNOSIS — Z7901 Long term (current) use of anticoagulants: Secondary | ICD-10-CM | POA: Diagnosis not present

## 2024-09-18 DIAGNOSIS — K449 Diaphragmatic hernia without obstruction or gangrene: Secondary | ICD-10-CM | POA: Diagnosis not present

## 2024-09-18 DIAGNOSIS — M654 Radial styloid tenosynovitis [de Quervain]: Secondary | ICD-10-CM | POA: Diagnosis not present

## 2024-09-18 DIAGNOSIS — I428 Other cardiomyopathies: Secondary | ICD-10-CM | POA: Insufficient documentation

## 2024-09-18 DIAGNOSIS — G5601 Carpal tunnel syndrome, right upper limb: Secondary | ICD-10-CM | POA: Insufficient documentation

## 2024-09-18 DIAGNOSIS — Z7985 Long-term (current) use of injectable non-insulin antidiabetic drugs: Secondary | ICD-10-CM | POA: Insufficient documentation

## 2024-09-18 DIAGNOSIS — E782 Mixed hyperlipidemia: Secondary | ICD-10-CM | POA: Insufficient documentation

## 2024-09-18 DIAGNOSIS — E669 Obesity, unspecified: Secondary | ICD-10-CM | POA: Diagnosis not present

## 2024-09-18 DIAGNOSIS — J449 Chronic obstructive pulmonary disease, unspecified: Secondary | ICD-10-CM | POA: Insufficient documentation

## 2024-09-18 DIAGNOSIS — I480 Paroxysmal atrial fibrillation: Secondary | ICD-10-CM | POA: Diagnosis not present

## 2024-09-18 DIAGNOSIS — I361 Nonrheumatic tricuspid (valve) insufficiency: Secondary | ICD-10-CM | POA: Diagnosis not present

## 2024-09-18 DIAGNOSIS — R519 Headache, unspecified: Secondary | ICD-10-CM | POA: Insufficient documentation

## 2024-09-18 DIAGNOSIS — Z6839 Body mass index (BMI) 39.0-39.9, adult: Secondary | ICD-10-CM | POA: Diagnosis not present

## 2024-09-18 DIAGNOSIS — I2510 Atherosclerotic heart disease of native coronary artery without angina pectoris: Secondary | ICD-10-CM | POA: Diagnosis not present

## 2024-09-18 DIAGNOSIS — Z79899 Other long term (current) drug therapy: Secondary | ICD-10-CM | POA: Diagnosis not present

## 2024-09-18 DIAGNOSIS — F32A Depression, unspecified: Secondary | ICD-10-CM | POA: Diagnosis not present

## 2024-09-18 DIAGNOSIS — Z01812 Encounter for preprocedural laboratory examination: Secondary | ICD-10-CM

## 2024-09-18 DIAGNOSIS — Z953 Presence of xenogenic heart valve: Secondary | ICD-10-CM | POA: Insufficient documentation

## 2024-09-18 HISTORY — DX: Other disorders of iron metabolism: E83.19

## 2024-09-18 HISTORY — PX: DORSAL COMPARTMENT RELEASE: SHX5039

## 2024-09-18 LAB — GLUCOSE, CAPILLARY
Glucose-Capillary: 84 mg/dL (ref 70–99)
Glucose-Capillary: 105 mg/dL — ABNORMAL HIGH (ref 70–99)

## 2024-09-18 LAB — PROTIME-INR
INR: 1.2 (ref 0.8–1.2)
Prothrombin Time: 15.6 s — ABNORMAL HIGH (ref 11.4–15.2)

## 2024-09-18 SURGERY — RELEASE, FIRST DORSAL COMPARTMENT, HAND
Anesthesia: General | Site: Hand | Laterality: Left

## 2024-09-18 MED ORDER — SEVOFLURANE IN SOLN
RESPIRATORY_TRACT | Status: AC
  Filled 2024-09-18: qty 250

## 2024-09-18 MED ORDER — VANCOMYCIN HCL IN DEXTROSE 1-5 GM/200ML-% IV SOLN
INTRAVENOUS | Status: AC
  Filled 2024-09-18: qty 200

## 2024-09-18 MED ORDER — PROPOFOL 10 MG/ML IV BOLUS
INTRAVENOUS | Status: AC
  Filled 2024-09-18: qty 20

## 2024-09-18 MED ORDER — BUPIVACAINE HCL (PF) 0.5 % IJ SOLN
INTRAMUSCULAR | Status: DC | PRN
  Administered 2024-09-18: 5 mL

## 2024-09-18 MED ORDER — PHENYLEPHRINE 80 MCG/ML (10ML) SYRINGE FOR IV PUSH (FOR BLOOD PRESSURE SUPPORT)
PREFILLED_SYRINGE | INTRAVENOUS | Status: DC | PRN
Start: 2024-09-18 — End: 2024-09-18
  Administered 2024-09-18 (×4): 80 ug via INTRAVENOUS

## 2024-09-18 MED ORDER — HYDROCODONE-ACETAMINOPHEN 5-325 MG PO TABS: 1.0000 | ORAL_TABLET | Freq: Every day | ORAL | 0 refills | Status: AC | PRN

## 2024-09-18 MED ORDER — SUCCINYLCHOLINE CHLORIDE 200 MG/10ML IV SOSY
PREFILLED_SYRINGE | INTRAVENOUS | Status: AC
  Filled 2024-09-18: qty 10

## 2024-09-18 MED ORDER — FENTANYL CITRATE (PF) 100 MCG/2ML IJ SOLN
INTRAMUSCULAR | Status: DC | PRN
  Administered 2024-09-18 (×2): 50 ug via INTRAVENOUS

## 2024-09-18 MED ORDER — FENTANYL CITRATE (PF) 100 MCG/2ML IJ SOLN
INTRAMUSCULAR | Status: AC
  Filled 2024-09-18: qty 2

## 2024-09-18 MED ORDER — EPHEDRINE 5 MG/ML INJ
INTRAVENOUS | Status: AC
  Filled 2024-09-18: qty 5

## 2024-09-18 MED ORDER — IBUPROFEN 800 MG PO TABS: 800.0000 mg | ORAL_TABLET | Freq: Three times a day (TID) | ORAL | 1 refills | Status: AC | PRN

## 2024-09-18 MED ORDER — EPHEDRINE SULFATE-NACL 50-0.9 MG/10ML-% IV SOSY
PREFILLED_SYRINGE | INTRAVENOUS | Status: DC | PRN
  Administered 2024-09-18 (×2): 5 mg via INTRAVENOUS

## 2024-09-18 MED ORDER — OXYCODONE HCL 5 MG PO TABS
5.0000 mg | ORAL_TABLET | Freq: Once | ORAL | Status: AC | PRN
  Administered 2024-09-18: 5 mg via ORAL

## 2024-09-18 MED ORDER — SUCCINYLCHOLINE CHLORIDE 200 MG/10ML IV SOSY
PREFILLED_SYRINGE | INTRAVENOUS | Status: DC | PRN
  Administered 2024-09-18: 100 mg via INTRAVENOUS

## 2024-09-18 MED ORDER — PROPOFOL 1000 MG/100ML IV EMUL
INTRAVENOUS | Status: AC
  Filled 2024-09-18: qty 100

## 2024-09-18 MED ORDER — CHLORHEXIDINE GLUCONATE 0.12 % MT SOLN
OROMUCOSAL | Status: AC
  Filled 2024-09-18: qty 15

## 2024-09-18 MED ORDER — FENTANYL CITRATE (PF) 100 MCG/2ML IJ SOLN: 25.0000 ug | INTRAMUSCULAR | Status: DC | PRN

## 2024-09-18 MED ORDER — BUPIVACAINE HCL (PF) 0.5 % IJ SOLN
INTRAMUSCULAR | Status: AC
  Filled 2024-09-18: qty 30

## 2024-09-18 MED ORDER — OXYCODONE HCL 5 MG/5ML PO SOLN: 5.0000 mg | Freq: Once | ORAL | Status: AC | PRN

## 2024-09-18 MED ORDER — SODIUM CHLORIDE 0.9 % IV SOLN: INTRAVENOUS | Status: DC

## 2024-09-18 MED ORDER — LACTATED RINGERS IV SOLN: INTRAVENOUS | Status: DC

## 2024-09-18 MED ORDER — 0.9 % SODIUM CHLORIDE (POUR BTL) OPTIME
TOPICAL | Status: DC | PRN
  Administered 2024-09-18: 500 mL

## 2024-09-18 MED ORDER — VANCOMYCIN HCL IN DEXTROSE 1-5 GM/200ML-% IV SOLN
1000.0000 mg | INTRAVENOUS | Status: AC
Start: 2024-09-18 — End: 2024-09-18
  Administered 2024-09-18: 1000 mg via INTRAVENOUS

## 2024-09-18 MED ORDER — LIDOCAINE HCL (PF) 2 % IJ SOLN
INTRAMUSCULAR | Status: AC
  Filled 2024-09-18: qty 5

## 2024-09-18 MED ORDER — ONDANSETRON HCL 4 MG/2ML IJ SOLN: 4.0000 mg | Freq: Once | INTRAMUSCULAR | Status: DC | PRN

## 2024-09-18 MED ORDER — LIDOCAINE HCL (CARDIAC) PF 100 MG/5ML IV SOSY
PREFILLED_SYRINGE | INTRAVENOUS | Status: DC | PRN
Start: 2024-09-18 — End: 2024-09-18
  Administered 2024-09-18: 100 mg via INTRAVENOUS

## 2024-09-18 MED ORDER — PROPOFOL 10 MG/ML IV BOLUS
INTRAVENOUS | Status: DC | PRN
  Administered 2024-09-18: 90 mg via INTRAVENOUS

## 2024-09-18 MED ORDER — OXYCODONE HCL 5 MG PO TABS
ORAL_TABLET | ORAL | Status: AC
  Filled 2024-09-18: qty 1

## 2024-09-18 MED ORDER — GLYCOPYRROLATE 0.2 MG/ML IJ SOLN
INTRAMUSCULAR | Status: AC
  Filled 2024-09-18: qty 1

## 2024-09-18 MED ORDER — ORAL CARE MOUTH RINSE: 15.0000 mL | Freq: Once | OROMUCOSAL | Status: AC

## 2024-09-18 MED ORDER — METOPROLOL SUCCINATE ER 25 MG PO TB24: 37.5000 mg | ORAL_TABLET | Freq: Every evening | ORAL | Status: AC

## 2024-09-18 MED ORDER — MIDAZOLAM HCL 2 MG/2ML IJ SOLN
INTRAMUSCULAR | Status: AC
  Filled 2024-09-18: qty 2

## 2024-09-18 MED ORDER — MIDAZOLAM HCL (PF) 2 MG/2ML IJ SOLN
INTRAMUSCULAR | Status: DC | PRN
  Administered 2024-09-18: 2 mg via INTRAVENOUS

## 2024-09-18 MED ORDER — DEXAMETHASONE SOD PHOSPHATE PF 10 MG/ML IJ SOLN
INTRAMUSCULAR | Status: DC | PRN
  Administered 2024-09-18: 10 mg via INTRAVENOUS

## 2024-09-18 MED ORDER — CHLORHEXIDINE GLUCONATE 0.12 % MT SOLN
15.0000 mL | Freq: Once | OROMUCOSAL | Status: AC
  Administered 2024-09-18: 15 mL via OROMUCOSAL

## 2024-09-18 SURGICAL SUPPLY — 31 items
BNDG ELASTIC 2INX 5YD STR LF (GAUZE/BANDAGES/DRESSINGS) ×1 IMPLANT
BNDG ELASTIC 3INX 5YD STR LF (GAUZE/BANDAGES/DRESSINGS) ×1 IMPLANT
BNDG ESMARCH 4X12 STRL LF (GAUZE/BANDAGES/DRESSINGS) ×1 IMPLANT
CHLORAPREP W/TINT 26 (MISCELLANEOUS) ×1 IMPLANT
CORD BIP STRL DISP 12FT (MISCELLANEOUS) ×1 IMPLANT
CUFF TOURN SGL QUICK 18X4 (TOURNIQUET CUFF) IMPLANT
DRAPE SURG 17X11 SM STRL (DRAPES) ×2 IMPLANT
FORCEPS JEWEL BIP 4-3/4 STR (INSTRUMENTS) ×1 IMPLANT
GAUZE SPONGE 4X4 12PLY STRL (GAUZE/BANDAGES/DRESSINGS) ×1 IMPLANT
GAUZE XEROFORM 1X8 LF (GAUZE/BANDAGES/DRESSINGS) ×1 IMPLANT
GLOVE BIO SURGEON STRL SZ8 (GLOVE) ×2 IMPLANT
GLOVE INDICATOR 8.0 STRL GRN (GLOVE) ×1 IMPLANT
GOWN STRL REUS W/ TWL LRG LVL3 (GOWN DISPOSABLE) ×1 IMPLANT
GOWN STRL REUS W/ TWL XL LVL3 (GOWN DISPOSABLE) ×1 IMPLANT
KIT TURNOVER KIT A (KITS) ×1 IMPLANT
MANIFOLD NEPTUNE II (INSTRUMENTS) ×1 IMPLANT
NDL HYPO 25X1 1.5 SAFETY (NEEDLE) ×1 IMPLANT
NEEDLE HYPO 25X1 1.5 SAFETY (NEEDLE) ×1 IMPLANT
NS IRRIG 500ML POUR BTL (IV SOLUTION) ×1 IMPLANT
PACK EXTREMITY ARMC (MISCELLANEOUS) ×1 IMPLANT
PENCIL SMOKE EVACUATOR (MISCELLANEOUS) ×1 IMPLANT
SPLINT LT WRIST LG 8.5 (SOFTGOODS) ×1 IMPLANT
SPLINT LT WRIST MD 8 (SOFTGOODS) IMPLANT
SPLINT LT WRIST XL 8.5+ LP LCK (SOFTGOODS) IMPLANT
SPLINT RT WRIST LG 8.5 (SOFTGOODS) ×1 IMPLANT
SPLINT RT WRIST MD 8 (SOFTGOODS) IMPLANT
SPLINT RT WRIST XL 8.5+ (SOFTGOODS) IMPLANT
STOCKINETTE IMPERVIOUS 9X36 MD (GAUZE/BANDAGES/DRESSINGS) ×1 IMPLANT
SUT PROLENE 4 0 PS 2 18 (SUTURE) ×1 IMPLANT
TRAP FLUID SMOKE EVACUATOR (MISCELLANEOUS) ×1 IMPLANT
WATER STERILE IRR 500ML POUR (IV SOLUTION) ×1 IMPLANT

## 2024-09-18 NOTE — Anesthesia Procedure Notes (Addendum)
 Procedure Name: Intubation Date/Time: 09/18/2024 7:55 AM  Performed by: Jackye Spanner, CRNAPre-anesthesia Checklist: Patient identified, Patient being monitored, Timeout performed, Emergency Drugs available and Suction available Patient Re-evaluated:Patient Re-evaluated prior to induction Oxygen Delivery Method: Circle system utilized Preoxygenation: Pre-oxygenation with 100% oxygen Induction Type: IV induction and Rapid sequence Laryngoscope Size: 3 and McGrath Grade View: Grade I Tube type: Oral Tube size: 7.0 mm Number of attempts: 1 Airway Equipment and Method: Stylet Placement Confirmation: ETT inserted through vocal cords under direct vision, positive ETCO2 and breath sounds checked- equal and bilateral Secured at: 22 cm Tube secured with: Tape Dental Injury: Teeth and Oropharynx as per pre-operative assessment  Comments: Pt. Was intubated with McGrath 3 blade, grade 3 view. With the McGrath 3 blade, she was still slightly difficult, able to see cords and advance with mild cricoid pressure.

## 2024-09-18 NOTE — Anesthesia Procedure Notes (Signed)
 Procedure Name: LMA Insertion Date/Time: 09/18/2024 7:36 AM  Performed by: Jackye Spanner, CRNAPre-anesthesia Checklist: Patient identified, Patient being monitored, Timeout performed, Emergency Drugs available and Suction available Patient Re-evaluated:Patient Re-evaluated prior to induction Oxygen Delivery Method: Circle system utilized Preoxygenation: Pre-oxygenation with 100% oxygen Induction Type: IV induction Ventilation: Mask ventilation without difficulty LMA: LMA inserted LMA Size: 5.0 Tube type: Oral Number of attempts: 1 Placement Confirmation: positive ETCO2 and breath sounds checked- equal and bilateral Tube secured with: Tape Dental Injury: Teeth and Oropharynx as per pre-operative assessment  Comments: Smooth atraumatic LMA placement.

## 2024-09-18 NOTE — Op Note (Signed)
 09/18/2024  8:32 AM  Patient:   Shelly Huynh  Pre-Op Diagnosis:   DeQuervain's tenosynovitis, left wrist.  Post-Op Diagnosis:   Same.  Procedure:   Release of first dorsal compartment, left wrist.  Surgeon:   DOROTHA Reyes Maltos, MD  Assistant:   HILLARY Hummer, PA-S  Anesthesia:   General LMA  Findings:   As above.  Complications:   None  EBL:   0 cc  Fluids:   600 cc crystalloid  TT:   19 minutes at 250 mmHg  Drains:   None  Closure:   4-0 Prolene interrupted sutures  Brief Clinical Note:   The patient is a 53 year old female with a history of progressively worsening radial sided left wrist pain. The symptoms have progressed despite medications, activity modification, splinting, etc. The patient's history and examination are consistent with DeQuervain's tenosynovitis. The patient presents at this time for release of the left first dorsal compartment.  Procedure:   The patient was brought into the operating room and lain in the supine position. After an attempt at general laryngeal mask anesthesia was unsuccessful, the patient underwent adequate general endotracheal intubation and anesthesia. The left hand and upper extremity were prepped with ChloraPrep solution before being draped sterilely. Preoperative antibiotics were administered. A timeout was performed to verify the appropriate surgical site before the limb was exsanguinated with an Esmarch and the tourniquet inflated to 250 mmHg.    A 1.5-2 cm incision was made transversely over the first dorsal compartment. The incision was carried down through subcutaneous tissues with care taken to identify and protect the sensory nerves and veins running in this area. The underlying retinaculum was identified. The first dorsal compartment was released from proximal to distal using Metzenbaum scissors. Areas of thickened tenosynovium were debrided sharply. The underlying tendons were carefully inspected and found to be intact. No  additional adhesions were identified.  The wound was copiously irrigated with sterile saline solution before the skin was reapproximated using 4-0 Prolene interrupted sutures. A total of 5 cc of 0.5% plain Sensorcaine  was injected in and around the incision to help with postoperative analgesia before a sterile bulky dressing was applied to the wrist. The patient was then awakened, extubated, and returned to the recovery room in satisfactory condition after tolerating the procedure well.

## 2024-09-18 NOTE — Transfer of Care (Signed)
 Immediate Anesthesia Transfer of Care Note  Patient: Shelly Huynh  Procedure(s) Performed: RELEASE, FIRST DORSAL COMPARTMENT, HAND (Left: Hand)  Patient Location: PACU  Anesthesia Type:General  Level of Consciousness: awake, alert , and drowsy  Airway & Oxygen Therapy: Patient Spontanous Breathing and Patient connected to face mask oxygen  Post-op Assessment: Report given to RN and Post -op Vital signs reviewed and stable  Post vital signs: Reviewed and stable  Last Vitals:  Vitals Value Taken Time  BP 105/85 09/18/24 08:30  Temp 35.8 0830  Pulse 65 09/18/24 08:32  Resp 18 0830  SpO2 100 % 09/18/24 08:32  Vitals shown include unfiled device data.  Last Pain:  Vitals:   09/18/24 0619  TempSrc: Temporal         Complications: No notable events documented.

## 2024-09-18 NOTE — H&P (Signed)
 History of Present Illness:  Shelly Huynh is a 53 y.o. female who presents for evaluation and treatment of recurrent radial sided left wrist pain. The patient notes that she had seen Gustavo Level, PA-C, for these symptoms in January, 2025. He diagnosed her with Everitt Quervain's tenosynovitis and gave her a steroid injection which she states provided substantial relief of her symptoms, lasting 3 to 4 months before her symptoms began to recur. She again notes moderate discomfort in the radial aspect of her wrist. Her symptoms are aggravated by repetitive activities requiring the use of her thumb. She denies any reinjury to the thumb, and denies any numbness or paresthesias to the other digits of her left hand.  Current Outpatient Medications:  albuterol  MDI, PROVENTIL , VENTOLIN , PROAIR , HFA 90 mcg/actuation inhaler Inhale 2 inhalations into the lungs every 6 (six) hours as needed for Wheezing for up to 180 days 6.7 g 5  ascorbic acid, vitamin C, (VITAMIN C) 500 MG tablet Take 500 mg by mouth once daily  aspirin  81 MG EC tablet Take 1 tablet (81 mg total) by mouth once daily 90 tablet 3  blood glucose diagnostic (CONTOUR NEXT TEST STRIPS) test strip 1 each (1 strip total) 3 (three) times daily Use as instructed. 200 each 4  candesartan (ATACAND) 8 MG tablet Take 1 tablet (8 mg total) by mouth once daily 90 tablet 3  colestipoL (COLESTID) 1 gram tablet Take 1 tablet (1 g total) by mouth 2 (two) times daily for 60 days 60 tablet 3  CONTOUR NEXT METER kit by XX route as directed 1 each 0  CRANBERRY ORAL Take 2 capsules by mouth once daily  cyclobenzaprine  (FLEXERIL ) 10 MG tablet Take 1 tablet (10 mg total) by mouth 3 (three) times daily as needed 30 tablet 1  enoxaparin  (LOVENOX ) 100 mg/mL injection syringe  ergocalciferol, vitamin D2, 1,250 mcg (50,000 unit) capsule Take 1 capsule (50,000 Units total) by mouth every 7 (seven) days 12 capsule 1  fluconazole (DIFLUCAN) 150 MG tablet (Patient not taking:  Reported on 07/31/2024)  fluticasone  propionate (FLONASE ) 50 mcg/actuation nasal spray Place 2 sprays into both nostrils 2 (two) times daily 16 g 5  hydrocortisone  2.5 % cream (Patient not taking: Reported on 07/31/2024)  ipratropium-albuteroL  (DUO-NEB) nebulizer solution Take 3 mLs by nebulization 4 (four) times daily for 360 days (Patient taking differently: Take 3 mLs by nebulization 4 (four) times daily as needed) 360 mL 5  iron  polysaccharides (POLY-IRON ) 150 mg iron  capsule Take 1 capsule (150 mg total) by mouth once daily 90 capsule 1  lancing device with lancets kit Use 1 each 3 (three) times daily Use as instructed. 200 each 4  lansoprazole (PREVACID) 30 MG DR capsule Take 1 capsule (30 mg total) by mouth once daily 90 capsule 1  levalbuterol (XOPENEX) 1.25 mg/3 mL nebulizer solution Take 3 mLs (1.25 mg total) by nebulization every 8 (eight) hours as needed for Wheezing 198 mL 1  levocetirizine (XYZAL ) 5 MG tablet Take 1 tablet (5 mg total) by mouth every evening 90 tablet 1  meclizine  (ANTIVERT ) 25 mg tablet Take 1 tablet (25 mg total) by mouth every 6 (six) hours as needed 30 tablet 3  memantine (NAMENDA) 5 MG tablet Take 1 tablet (5 mg total) by mouth 2 (two) times daily Follow instructions given in office per after visit summary 60 tablet 11  metoprolol  SUCCinate (TOPROL -XL) 25 MG XL tablet Take 1.5 tablets (37.5 mg total) by mouth once daily 90 tablet 3  minoxidiL 5 %  Foam (Patient not taking: Reported on 07/31/2024)  montelukast  (SINGULAIR ) 10 mg tablet Take 1 tablet (10 mg total) by mouth at bedtime 90 tablet 1  pravastatin  (PRAVACHOL ) 20 MG tablet Take 1 tablet (20 mg total) by mouth once daily 90 tablet 3  pregabalin  (LYRICA ) 75 MG capsule Take 1 capsule (75 mg total) by mouth 2 (two) times daily 60 capsule 1  promethazine -dextromethorphan (PROMETHAZINE -DM) 6.25-15 mg/5 mL syrup Take 5 mLs by mouth every 6 (six) hours as needed 120 mL 0  prothrombin time test strips (COAGUCHEK XS)  Strp Use 1 strip once a week 18 strip 3  spironolactone  (ALDACTONE ) 50 MG tablet Take 1 tablet (50 mg total) by mouth once daily for 180 days Patient takes 50 mg plus 25 mg 90 tablet 1  tirzepatide (MOUNJARO) 5 mg/0.5 mL pen injector Inject 0.5 mLs (5 mg total) subcutaneously once a week 2 mL 2  tirzepatide (ZEPBOUND) 5 mg/0.5 mL injection Inject 0.5 mLs (5 mg total) subcutaneously every 7 (seven) days (Patient not taking: Reported on 07/31/2024) 2 mL 3  tiZANidine (ZANAFLEX) 4 MG tablet Take 1 tablet (4 mg total) by mouth 3 (three) times daily 90 tablet 2  warfarin (COUMADIN ) 1 MG tablet Take 1 tablet (1 mg total) by mouth once daily 90 tablet 1  warfarin (COUMADIN ) 4 MG tablet TAKE 1 TABLET(4 MG) BY MOUTH EVERY DAY 30 tablet 11  warfarin (COUMADIN ) 6 MG tablet Take 1 tablet (6 mg total) by mouth once daily 6 mg every day except Sunday take 7.5mg  180 tablet 1  warfarin (COUMADIN ) 7.5 MG tablet Take 1 tablet (7.5 mg total) by mouth once daily 90 tablet 1   Allergies:  Iodinated Contrast Media Anaphylaxis and Hives  Especially CT contrast media; tightness in chest  Sulfa (Sulfonamide Antibiotics) Anaphylaxis  Keflex [Cephalexin] Abdominal Pain  Betadine [Povidone-Iodine] Hives  Lisinopril Rash  Losartan Itching  Mucinex [Guaifenesin] Hives  Zofran  [Ondansetron  Hcl] Itching   Past Medical History:  3-vessel CAD 09/13/2014  By cat scan  Allergy  Anxiety  Aortic valve stenosis and insufficiency, rheumatic 01/15/2015  Arthritis  Asthma without status asthmaticus (HHS-HCC)  ATN (acute tubular necrosis) ()  Centrilobular emphysema (CMS/HHS-HCC) 04/25/2022  CHF (congestive heart failure) (CMS/HHS-HCC)  Chronic anticoagulation 05/07/2015  Coumadin  anti-coagulation therapy mechanical heart valve prosthesis with goal INR range 2.5-3.5  Chronic diastolic heart failure, NYHA class 2 (CMS/HHS-HCC) 03/04/2015  Coronary artery disease due to calcified coronary lesion 03/04/2015  1. As determined by  CT scan with Dr. Hester (Cardiology)  COVID-19 2022  Current smoker 11/10/2016  Depression  Diabetes mellitus type 2, diet-controlled (CMS/HHS-HCC) 05/17/2016  Emphysema/COPD (CMS/HHS-HCC) 04/25/2022  Encounter for blood transfusion  Essential hypertension 09/11/2014  Fever in adult 07/08/2017  GERD (gastroesophageal reflux disease)  H/O mechanical aortic valve replacement 04/03/2015  19 mm Saint Jude aortic valve prosthesis  H/O mitral valve replacement with mechanical valve 04/03/2015  25 mm Saint Jude mechanical mitral valve prosthesis  Hepatic disease  History of tricuspid valve replacement with bioprosthetic valve 04/03/2015  29 mm Mosaic biologic tricuspid valve prosthesis  Hyperlipemia, mixed 09/11/2014  Hyperlipidemia  Hypertension  Long term current use of anticoagulants with INR goal of 2.5-3.5 05/11/2015  Major depressive disorder, recurrent, 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  Mitral stenosis with insufficiency, 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  Moderate tricuspid regurgitation 01/15/2015  Obesity  OSA (obstructive sleep apnea) 10/28/2014  Postoperative anemia  Postoperative infection of wound of sternum 04/24/2015 Refusal of blood transfusions as patient is Jehovah's Witness 03/12/2015  Shortness of  breath 10/28/2014  Sleep apnea  Tobacco abuse   Past Surgical History:  LIPOSUCTION TRUNK 1996  TUBAL LIGATION 08/1999  UMBILICAL HERNIA REPAIR 2005  HERNIA REPAIR 09/2004  HYSTERECTOMY 2010 (TAH still has ovaries, [facility])  Cardiac Catherization 12/05/2014  CARDIAC VALVE REPLACEMENT 04/02/2015  REPLACEMENT AORTIC VALVE N/A 04/03/2015  Procedure: REPLACEMENT AORTIC VALVE; Surgeon: Nancyann JONETTA Schwab, MD; Location: [facility]; Service: Cardiothoracic  REPLACEMENT MITRAL VALVE N/A 04/03/2015  Procedure: REPLACEMENT MITRAL VALVE; Surgeon: Nancyann JONETTA Schwab, MD; Location: [facility]; Service: Cardiothoracic  REPLACEMENT TRICUSPID VALVE N/A 04/03/2015  Procedure:  REPLACEMENT TRICUSPID VALVE ; Surgeon: Nancyann JONETTA Schwab, MD; Location: [facility]; Service: Cardiothoracic  STERNOTOMY MEDIAN N/A 04/03/2015  Procedure: STERNOTOMY MEDIAN; Surgeon: Nancyann JONETTA Schwab, MD; Location: [facility]; Service: Cardiothoracic  BREAST EXCISIONAL BIOPSY Right 11/20/2019 (Dr Cesar)  CHOLECYSTECTOMY 07/2020  COLONOSCOPY 09/21/2020 (Tubular adenomas/Repeat 70yrs/TKT)  EGD 09/21/2020 (Gastritis/No Repeat/[facility])  CESAREAN SECTION 1987 and 2000   Family History:  Uterine cancer Mother Laurette Agent  Thyroid  disease Mother Laurette Agent  Thyroid  cancer Mother Laurette Agent  No Known Problems Father  Arthritis Maternal Grandmother Delores  Coronary Artery Disease - Maternal Grandmother Delores  High blood pressure - Maternal Grandmother Delores  Breast cancer Paternal Jeannine Raspberry  Coronary Artery Disease - Paternal Grandmother Geraldine  High blood pressure - Paternal Grandmother Geraldine  Thyroid  disease Paternal Jeannine Raspberry  Coronary Artery Disease - Other  grandparents  Anesthesia problems Neg Hx   Social History:   Socioeconomic History:  Marital status: Married  Tobacco Use  Smoking status: Former  Current packs/day: 0.00  Average packs/day: 0.5 packs/day for 25.0 years (12.5 ttl pk-yrs)  Types: Cigarettes  Start date: 03/18/1990  Quit date: 03/19/2015  Years since quitting: 9.4  Passive exposure: Current  Smokeless tobacco: Never  Tobacco comments:  Patient smokes 1/2ppd. 01/17/2024 -IC  Vaping Use  Vaping status: Never Used  Substance and Sexual Activity  Alcohol use: Not Currently  Alcohol/week: 0.0 standard drinks of alcohol  Comment: occasional, during holidays  Drug use: Never  Sexual activity: Yes  Partners: Male  Birth control/protection: Surgical   Social Drivers of Health:   Physicist, Medical Strain: Low Risk (04/01/2024)  Overall Financial Resource Strain (CARDIA)  Difficulty of Paying Living Expenses:  Not hard at all  Food Insecurity: No Food Insecurity (04/01/2024)  Hunger Vital Sign  Worried About Running Out of Food in the Last Year: Never true  Ran Out of Food in the Last Year: Never true  Transportation Needs: No Transportation Needs (04/01/2024)  PRAPARE - Risk Analyst (Medical): No  Lack of Transportation (Non-Medical): No   Review of Systems:  A comprehensive 14 point ROS was performed, reviewed, and the pertinent orthopaedic findings are documented in the HPI.  Physical Exam: Vitals:  08/12/24 1212 08/12/24 1214  BP: 104/80  Weight: (!) 38.3 kg (84 lb 5.6 oz)  Height: 160 cm (5' 3)  PainSc: 0-No pain 0-No pain  PainLoc: Shoulder Shoulder   General/Constitutional: The patient appears to be well-nourished, well-developed, and in no acute distress. Neuro/Psych: Normal mood and affect, oriented to person, place and time. Eyes: Non-icteric. Pupils are equal, round, and reactive to light, and exhibit synchronous movement. ENT: Unremarkable. Lymphatic: No palpable adenopathy. Respiratory: Lungs clear to auscultation, Normal chest excursion, No wheezes, and Non-labored breathing Cardiovascular: Regular rate and rhythm with distinct mechanical valve sounds and mild systolic ejection murmur. No edema, swelling or tenderness, except as noted in detailed exam. Integumentary:  No impressive skin lesions present, except as noted in detailed exam. Musculoskeletal: Unremarkable, except as noted in detailed exam.  Left wrist exam: Skin inspection of the left wrist is unremarkable. No swelling, erythema, ecchymosis, abrasions, or other skin abnormalities are identified. There is mild tenderness to palpation over the first dorsal compartment, but there are no other areas of tenderness around the wrist or hand. She demonstrates full range of motion of her wrist without any pain or catching. [name] able to actively flex and extend all digits without any pain or  triggering. [name] able to oppose her thumb to the little finger, but does have a mildly positive Finklestein's test. [name] neurovascularly intact to all digits.  Assessment: De Quervain's tenosynovitis left wrist.  Plan: The treatment options were discussed with the patient and her husband. In addition, patient educational materials were provided regarding the diagnosis and treatment options. Regarding her recurrent radial sided left wrist symptoms, the patient is increasingly frustrated by her symptoms and function limitations, and is ready to consider more aggressive treatment options. [name] offered but declines a repeat steroid injection into the first dorsal compartment. Therefore, I have recommended a surgical procedure, specifically a release of the first dorsal compartment. The procedure was discussed with the patient, as were the potential risks (including bleeding, infection, nerve and/or blood vessel injury, persistent or recurrent pain, stiffness of the thumb, weakness of grip, need for further surgery, blood clots, strokes, heart attacks and/or arhythmias, pneumonia, etc.) and benefits. The patient states his/her understanding and wishes to proceed. All of the patient's questions and concerns were answered. She can call any time with further concerns. She will follow up post-surgery, routine.    H&P reviewed and patient re-examined. No changes.

## 2024-09-18 NOTE — Anesthesia Postprocedure Evaluation (Signed)
 Anesthesia Post Note  Patient: Shelly Huynh Range  Procedure(s) Performed: RELEASE, FIRST DORSAL COMPARTMENT, HAND (Left: Hand)  Patient location during evaluation: PACU Anesthesia Type: General Level of consciousness: awake and alert, oriented and patient cooperative Pain management: pain level controlled Vital Signs Assessment: post-procedure vital signs reviewed and stable Respiratory status: spontaneous breathing, nonlabored ventilation and respiratory function stable Cardiovascular status: blood pressure returned to baseline and stable Postop Assessment: adequate PO intake Anesthetic complications: no   No notable events documented.   Last Vitals:  Vitals:   09/18/24 0900 09/18/24 0910  BP: 115/85 128/77  Pulse: 70 (!) 54  Resp: 17 16  Temp: (!) 36.2 C (!) 36.3 C  SpO2: 98% 96%    Last Pain:  Vitals:   09/18/24 0910  TempSrc: Temporal  PainSc: 0-No pain                 Jahmiyah Dullea

## 2024-09-18 NOTE — Anesthesia Preprocedure Evaluation (Addendum)
 Anesthesia Evaluation  Patient identified by MRN, date of birth, ID band Patient awake    Reviewed: Allergy & Precautions, NPO status , Patient's Chart, lab work & pertinent test results  History of Anesthesia Complications Negative for: history of anesthetic complications  Airway Mallampati: IV   Neck ROM: Full    Dental  (+) Missing   Pulmonary asthma , COPD, Current Smoker (1/2 ppd)Patient did not abstain from smoking.   Pulmonary exam normal breath sounds clear to auscultation       Cardiovascular hypertension, + CAD and +CHF  Normal cardiovascular exam+ dysrhythmias (a fib on warfarin) + Valvular Problems/Murmurs (s/p AVR, MVR, TVR)  Rhythm:Regular Rate:Normal  ECG 07/31/24: Sinus rhythm 69 PR 150 QRS 102, R prime V1 V2, QT 448 QTc 485 T wave V6 inverted 1 aVL  Echo 03/12/24:  NORMAL LEFT VENTRICULAR SYSTOLIC FUNCTION WITH MODERATE LVH  ESTIMATED EF: >55%  INDETERMINATE DIASTOLIC FUNCTION  NORMAL RIGHT VENTRICULAR SYSTOLIC FUNCTION  VALVULAR REGURGITATION: No AR, TRIVIAL MR, TRIVIAL PR, MILD TR  NO VALVULAR STENOSIS  No significant valvular dysfunction.  H/o Mechanical mitral valve replacement 2016. Mean gradient 3 mmHg at HR 52 bpm  H/o Mechanical aortic valve replacement 2016 with mildly elevated gradients unchanged from prior echo (peak velocity 3.3 m/s, mean gradient 27 mmHg, DI 0.33).  H/o Bioprosthetic tricuspid valve replacement, mean gradient 4 mmHg at HR 52 bpm  Left atrium severely enlarged.   Holter 07/04/24:  1. 7 day Holter monitor from 06/18/2024 to 06/25/2024 2. Predominant rhythm is sinus rhythm with frequent supraventricular ectopy.. 3. Maximum heart rate 154 bpm, minimum heart rate 38 bpm the average heart rate of 67 bpm 4. Frequent 5% PACs and occasional 1% PVCs noted 5. Frequent, 142 occurrences of short lasting supraventricular tachycardia, longest episode 16 seconds 6. 1 episode of very short lasting  nonsustained ventricular tachycardia for 3 beats 7. 1 patient trigger associated with supraventricular ectopy     Neuro/Psych  Headaches PSYCHIATRIC DISORDERS Anxiety Depression       GI/Hepatic hiatal hernia,GERD  ,,Cirrhosis    Endo/Other  diabetes, Type 2  Obesity   Renal/GU      Musculoskeletal   Abdominal   Peds  Hematology  (+) Blood dyscrasia, anemia   Anesthesia Other Findings Last dose of Mounjaro 09/08/24.   Reviewed and agree with Dorise Boor pre-anesthesia clinical review note.    Cardiology note 07/31/24:  IMPRESSION:   Palpitations: Consider recurrence of atrial tachycardia ablated in March, PVCs, VT, other atrial arrhythmia. Prior to the ablation she reports episodes of tachycardia which she has not felt since then. -Holter Jul 2025: brief and/or slow atrial tachycardia longest 17sec at 117 bpm; no sustained SVT similar to what was ablated March 2025. 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 Increase metoprolol  to 37.5 mg a day 2. Reduce spironolactone  to 50 mg a day 3. Return 6 mos    Reproductive/Obstetrics                              Anesthesia Physical Anesthesia Plan  ASA: 3  Anesthesia Plan: General   Post-op Pain Management:    Induction: Intravenous  PONV Risk Score and Plan: 2 and Ondansetron , Dexamethasone  and Treatment may vary due to age or medical condition  Airway Management Planned: LMA  Additional Equipment:   Intra-op Plan:   Post-operative Plan: Extubation in  OR  Informed Consent: I have reviewed the patients History and Physical, chart, labs and discussed the procedure including the risks, benefits and alternatives for the proposed anesthesia with the patient or authorized representative who has indicated his/her understanding and acceptance.     Dental advisory given  Plan Discussed with: CRNA  Anesthesia Plan Comments: (Patient consented for risks of anesthesia including but not limited to:  - adverse reactions to medications - damage to eyes, teeth, lips or other oral mucosa - nerve damage due to positioning  - sore throat or hoarseness - damage to heart, brain, nerves, lungs, other parts of body or loss of life  Informed patient about role of CRNA in peri- and intra-operative care.  Patient voiced understanding.)         Anesthesia Quick Evaluation

## 2024-09-18 NOTE — Discharge Instructions (Addendum)
 Orthopedic discharge instructions: Keep dressing dry and intact. Keep hand elevated above heart level. May shower after dressing removed on postop day 4 (Sunday). Cover sutures with Band-Aids after drying off. Apply ice to affected area frequently. Take ibuprofen 800 mg TID with meals for 3-5 days, then as necessary. Take ES Tylenol  or pain medication as prescribed when needed.  May resume Coumadin  tonight and may resume daily baby aspirin  tomorrow morning. Return for follow-up in 10-14 days or as scheduled.

## 2024-09-19 ENCOUNTER — Encounter: Payer: Self-pay | Admitting: Surgery

## 2024-09-23 DIAGNOSIS — Z953 Presence of xenogenic heart valve: Secondary | ICD-10-CM | POA: Diagnosis not present

## 2024-09-23 DIAGNOSIS — M5137 Other intervertebral disc degeneration, lumbosacral region with discogenic back pain only: Secondary | ICD-10-CM | POA: Diagnosis not present

## 2024-09-23 DIAGNOSIS — M797 Fibromyalgia: Secondary | ICD-10-CM | POA: Diagnosis not present

## 2024-09-23 DIAGNOSIS — G4733 Obstructive sleep apnea (adult) (pediatric): Secondary | ICD-10-CM | POA: Diagnosis not present

## 2024-09-23 DIAGNOSIS — M47816 Spondylosis without myelopathy or radiculopathy, lumbar region: Secondary | ICD-10-CM | POA: Diagnosis not present

## 2024-09-23 DIAGNOSIS — R7309 Other abnormal glucose: Secondary | ICD-10-CM | POA: Diagnosis not present

## 2024-09-23 DIAGNOSIS — Z7901 Long term (current) use of anticoagulants: Secondary | ICD-10-CM | POA: Diagnosis not present

## 2024-09-23 DIAGNOSIS — Z952 Presence of prosthetic heart valve: Secondary | ICD-10-CM | POA: Diagnosis not present

## 2024-09-27 DIAGNOSIS — Z952 Presence of prosthetic heart valve: Secondary | ICD-10-CM | POA: Diagnosis not present

## 2024-09-30 DIAGNOSIS — Z7901 Long term (current) use of anticoagulants: Secondary | ICD-10-CM | POA: Diagnosis not present

## 2024-09-30 DIAGNOSIS — I4892 Unspecified atrial flutter: Secondary | ICD-10-CM | POA: Diagnosis not present

## 2024-09-30 DIAGNOSIS — I2510 Atherosclerotic heart disease of native coronary artery without angina pectoris: Secondary | ICD-10-CM | POA: Diagnosis not present

## 2024-09-30 DIAGNOSIS — M654 Radial styloid tenosynovitis [de Quervain]: Secondary | ICD-10-CM | POA: Diagnosis not present

## 2024-09-30 DIAGNOSIS — E119 Type 2 diabetes mellitus without complications: Secondary | ICD-10-CM | POA: Diagnosis not present

## 2024-09-30 DIAGNOSIS — F17210 Nicotine dependence, cigarettes, uncomplicated: Secondary | ICD-10-CM | POA: Diagnosis not present

## 2024-09-30 DIAGNOSIS — I471 Supraventricular tachycardia, unspecified: Secondary | ICD-10-CM | POA: Diagnosis not present

## 2024-09-30 DIAGNOSIS — F411 Generalized anxiety disorder: Secondary | ICD-10-CM | POA: Diagnosis not present

## 2024-09-30 DIAGNOSIS — I2584 Coronary atherosclerosis due to calcified coronary lesion: Secondary | ICD-10-CM | POA: Diagnosis not present

## 2024-09-30 DIAGNOSIS — R519 Headache, unspecified: Secondary | ICD-10-CM | POA: Diagnosis not present

## 2024-09-30 DIAGNOSIS — Z953 Presence of xenogenic heart valve: Secondary | ICD-10-CM | POA: Diagnosis not present

## 2024-09-30 DIAGNOSIS — Z952 Presence of prosthetic heart valve: Secondary | ICD-10-CM | POA: Diagnosis not present

## 2024-09-30 DIAGNOSIS — E669 Obesity, unspecified: Secondary | ICD-10-CM | POA: Diagnosis not present

## 2024-10-28 DIAGNOSIS — M5412 Radiculopathy, cervical region: Secondary | ICD-10-CM | POA: Diagnosis not present

## 2024-10-28 DIAGNOSIS — M47812 Spondylosis without myelopathy or radiculopathy, cervical region: Secondary | ICD-10-CM | POA: Diagnosis not present

## 2024-10-28 DIAGNOSIS — M654 Radial styloid tenosynovitis [de Quervain]: Secondary | ICD-10-CM | POA: Diagnosis not present

## 2024-11-28 NOTE — Progress Notes (Signed)
 Routine Annual Gynecology Examination   PCP: Shelly Tamra Cal, MD  Chief Complaint:  Chief Complaint  Patient presents with   Annual Exam   History of Present Illness:  [name] is a 54 y.o. [phone] presents today for a new patient annual examination.    Patient Concerns: - Reports of recurrent boils when shaving, uses over the counter medication with improvement.   Pertinent Hx: - HTN, A-flutter - long term anticoagulation  - Fibroids s/p TAH 2010   Menopausal bleeding: denies Menopausal symptoms: denies  [name] single partner, contraception - status post hysterectomy. She does have vaginal dryness, notices daily.   Breast symptoms: reports bilateral nipple discharge - milky, ongoing for the past 25 years.   Screening: - Last Pap: 2017  Results were: no abnormalities  - Last mammogram: 03/19/2024  Results were: normal--routine follow-up in 12 months.  [name] not aware of how her breast look and feel. Denies any breast concerns.  - Breast Density: C - Heterogeneously dense - Family hx of breast cancer: reports paternal grandmother. - Family hx of ovarian cancer: denies - Family hx of colon cancer: denies - Colon Cancer Screening: Colonoscopy completed on 09/21/2020. Repeat 7 years, due 09/2027. - DEXA: has not been screened for osteoporosis  Social Hx: Marital Status: married Tobacco use: former smoker. Alcohol use: occasional Exercise: not active  She does get adequate calcium and Vitamin D in her diet.  The patient wears seatbelts: yes. The patient reports that domestic violence in her life is absent.   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 As determined by CT scan with Dr. 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   29 mm Mosaic biologic tricuspid valve prosthesis   Hyperlipemia, mixed 09/11/2014   Hyperlipidemia    Hypertension    Long term current use of anticoagulants with INR goal of 2.5-3.5 05/11/2015   Major depressive disorder, recurrent, 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   Mitral stenosis with insufficiency, 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   Moderate tricuspid regurgitation 01/15/2015   Obesity    OSA (obstructive sleep apnea) 10/28/2014   Postoperative anemia    Postoperative infection of wound of sternum 04/24/2015   Postoperative infection of wound of sternum, subsequent encounter 04/24/2015   Refusal of blood transfusions as patient is  Jehovah's Witness 03/12/2015   Shortness of breath 10/28/2014   Sleep apnea    Tobacco abuse    Tobacco abuse     Past Surgical History:  Procedure Laterality Date   LIPOSUCTION TRUNK  1996   TUBAL LIGATION  08/1999   UMBILICAL  HERNIA REPAIR  2005   HERNIA REPAIR  09/2004   HYSTERECTOMY  2010   TAH still has ovaries, [facility]   Cardiac Catherization  12/05/2014   CARDIAC VALVE REPLACEMENT  04/02/2015   REPLACEMENT AORTIC VALVE N/A 04/03/2015   Procedure: REPLACEMENT AORTIC VALVE;  Surgeon: Shelly JONETTA Schwab, MD;  Location: [facility];  Service: Cardiothoracic;  Laterality: N/A;   REPLACEMENT MITRAL VALVE N/A 04/03/2015   Procedure: REPLACEMENT MITRAL VALVE;  Surgeon: Shelly JONETTA Schwab, MD;  Location: [facility];  Service: Cardiothoracic;  Laterality: N/A;   REPLACEMENT TRICUSPID VALVE N/A 04/03/2015   Procedure: REPLACEMENT TRICUSPID VALVE ;  Surgeon: Shelly JONETTA Schwab, MD;  Location: [facility];  Service: Cardiothoracic;  Laterality: N/A;   STERNOTOMY MEDIAN N/A 04/03/2015   Procedure: STERNOTOMY MEDIAN;  Surgeon: Shelly JONETTA Schwab, MD;  Location: [facility];  Service: Cardiothoracic;  Laterality: N/A;   BREAST EXCISIONAL BIOPSY Right 11/20/2019   Dr Shelly Huynh   CHOLECYSTECTOMY  07/2020   COLONOSCOPY  09/21/2020   Tubular adenomas/Repeat 39yrs/[facility]   EGD  09/21/2020   Gastritis/No Repeat/[facility]   CESAREAN SECTION  1987 and 2000    Prior to Admission medications  Medication Sig Taking? Last Dose  albuterol  MDI, PROVENTIL , VENTOLIN , PROAIR , HFA 90 mcg/actuation inhaler Inhale 2 inhalations into the lungs every 6 (six) hours as needed for Wheezing for up to 180 days Yes Taking  ascorbic acid, vitamin C, (VITAMIN C) 500 MG tablet Take 500 mg by mouth once daily Yes Taking  aspirin  81 MG EC tablet Take 1 tablet (81 mg total) by mouth once daily Yes Taking  candesartan (ATACAND) 8 MG tablet Take 1 tablet (8 mg total) by mouth once daily Yes Taking  CRANBERRY ORAL Take 2 capsules by mouth once daily Yes Taking  cyclobenzaprine  (FLEXERIL ) 10 MG tablet Take 1 tablet (10 mg total) by mouth 3 (three) times daily as needed Yes Taking  FUROsemide  (LASIX ) 40 MG tablet Take 1  tablet (40 mg total) by mouth once daily Yes Taking  HYDROcodone -acetaminophen  (NORCO) 5-325 mg tablet Take 1 tablet by mouth every 6 (six) hours as needed for Pain Managed by ortho Yes Taking  iron  polysaccharides (POLY-IRON ) 150 mg iron  capsule Take 1 capsule (150 mg total) by mouth once daily Yes Taking  lansoprazole (PREVACID) 30 MG DR capsule Take 1 capsule (30 mg total) by mouth once daily Yes Taking  levalbuterol (XOPENEX) 1.25 mg/3 mL nebulizer solution Take 3 mLs (1.25 mg total) by nebulization every 8 (eight) hours as needed for Wheezing Yes Taking  levocetirizine (XYZAL ) 5 MG tablet Take 1 tablet (5 mg total) by mouth every evening Yes Taking  meclizine  (ANTIVERT ) 25 mg tablet Take 1 tablet (25 mg total) by mouth every 6 (six) hours as needed Yes Taking  metoprolol  SUCCinate (TOPROL -XL) 25 MG XL tablet Take 1.5 tablets (37.5 mg total) by mouth once daily Yes Taking  montelukast  (SINGULAIR ) 10 mg tablet Take 1 tablet (10 mg total) by mouth at bedtime Yes Taking  multivitamin tablet Take 1 tablet by mouth once daily Yes Taking  pravastatin  (PRAVACHOL ) 20 MG tablet Take 1 tablet (20 mg total) by mouth once daily Yes Taking  spironolactone  (ALDACTONE )  50 MG tablet Take 1 tablet (50 mg total) by mouth once daily for 180 days Patient takes 50 mg plus 25 mg Yes Taking  tirzepatide (MOUNJARO) 10 mg/0.5 mL pen injector Inject 0.5 mLs (10 mg total) subcutaneously once a week Yes Taking  tiZANidine (ZANAFLEX) 4 MG tablet Take 1 tablet (4 mg total) by mouth 3 (three) times daily Yes Taking  topiramate (TOPAMAX) 25 MG tablet Take 1 tablet (25 mg total) by mouth once daily Yes Taking  warfarin (COUMADIN ) 1 MG tablet Take 1 tablet (1 mg total) by mouth once daily Yes PRN Not Currently Taking  warfarin (COUMADIN ) 4 MG tablet TAKE 1 TABLET(4 MG) BY MOUTH EVERY DAY Yes PRN Not Currently Taking  warfarin (COUMADIN ) 6 MG tablet Take 1 tablet (6 mg total) by mouth once daily 6 mg every day except Sunday take  7.5mg  Yes Taking  warfarin (COUMADIN ) 7.5 MG tablet Take 1 tablet (7.5 mg total) by mouth once daily Yes Taking  blood glucose diagnostic (CONTOUR NEXT TEST STRIPS) test strip 1 each (1 strip total) 3 (three) times daily Use as instructed.    clindamycin (CLEOCIN T) 1 % swab Apply 1 applicator topically once daily as needed    CONTOUR NEXT METER kit by XX route as directed    enoxaparin  (LOVENOX ) 100 mg/mL injection syringe     ergocalciferol, vitamin D2, 1,250 mcg (50,000 unit) capsule Take 1 capsule (50,000 Units total) by mouth every 7 (seven) days    fluticasone  propionate (FLONASE ) 50 mcg/actuation nasal spray Place 2 sprays into both nostrils 2 (two) times daily    lancing device with lancets kit Use 1 each 3 (three) times daily Use as instructed.    pregabalin  (LYRICA ) 75 MG capsule Take 1 capsule (75 mg total) by mouth 2 (two) times daily    prothrombin time test strips (COAGUCHEK XS) Strp Use 1 strip once a week    spironolactone  (ALDACTONE ) 25 MG tablet TAKE 1 TABLET(25 MG) BY MOUTH DAILY Patient not taking: Reported on 11/29/2024  Not Taking    Allergies  Allergen Reactions   Iodinated Contrast Media Anaphylaxis and Hives    Especially CT contrast media; tightness in chest   Sulfa (Sulfonamide Antibiotics) Anaphylaxis   Keflex [Cephalexin] Abdominal Pain   Betadine [Povidone-Iodine] Hives   Lisinopril Rash   Losartan Itching   Mucinex [Guaifenesin] Hives   Zofran  [Ondansetron  Hcl] Itching    Gynecologic History: No LMP recorded. Patient has had a hysterectomy. Contraception: status post hysterectomy Pap Hx: denies   Obstetric History: [id number]  Social History   Socioeconomic History   Marital status: Married  Tobacco Use   Smoking status: Former    Current packs/day: 0.00    Average packs/day: 0.5 packs/day for 25.0 years (12.5 ttl pk-yrs)    Types: Cigarettes    Start date: 03/18/1990    Quit date: 03/19/2015    Years since quitting: 9.7    Passive  exposure: Current   Smokeless tobacco: Never   Tobacco comments:    Patient smokes 1/2ppd. 01/17/2024 -IC  Vaping Use   Vaping status: Never Used  Substance and Sexual Activity   Alcohol use: Not Currently    Alcohol/week: 0.0 standard drinks of alcohol    Comment: occasional, during holidays   Drug use: Never   Sexual activity: Yes    Partners: Male    Birth control/protection: Surgical   Social Drivers of Health   Financial Resource Strain: Medium Risk (11/29/2024)   Overall Physicist, Medical Strain (  CARDIA)    Difficulty of Paying Living Expenses: Somewhat hard  Food Insecurity: Food Insecurity Present (11/29/2024)   Hunger Vital Sign    Worried About Running Out of Food in the Last Year: Sometimes true    Ran Out of Food in the Last Year: Sometimes true  Transportation Needs: No Transportation Needs (11/29/2024)   PRAPARE - Administrator, Civil Service (Medical): No    Lack of Transportation (Non-Medical): No  Physical Activity: Unknown (10/02/2017)   [facility]   Exercise Vital Sign    Days of Exercise per Week: 0 days  Stress: Stress Concern Present (10/02/2017)   Received from [facility] - Occupational Stress Questionnaire    Feeling of Stress : Very much  Social Connections: Socially Integrated (10/02/2017)   Received from [facility]   Social Connection and Isolation Panel    Frequency of Communication with Friends and Family: More than three times a week    Frequency of Social Gatherings with Friends and Family: Once a week    Attends Religious Services: More than 4 times per year    Active Member of [employer] or Organizations: Yes    Attends Engineer, Structural: More than 4 times per year    Marital Status: Married  Housing Stability: Unknown (11/29/2024)   Housing Stability Vital Sign    Unable to Pay for Housing in the Last Year: No    Number of Times Moved in the  Last Year: 0    Homeless in the Last Year: Patient declined    Family History  Problem Relation Name Age of Onset   Uterine cancer Mother Laurette Agent    Thyroid  disease Mother Laurette Agent    Thyroid  cancer Mother Laurette Agent    No Known Problems Father     Arthritis Maternal Grandmother Delores    Coronary Artery Disease (Blocked arteries around heart) Maternal Grandmother Delores    High blood pressure (Hypertension) Maternal Grandmother Delores    Breast cancer Paternal Jeannine Raspberry    Coronary Artery Disease (Blocked arteries around heart) Paternal Grandmother Geraldine    High blood pressure (Hypertension) Paternal Grandmother Raspberry    Thyroid  disease Paternal Jeannine Raspberry    Coronary Artery Disease (Blocked arteries around heart) Other         grandparents   Anesthesia problems Neg Hx      ROS: see HPI for pertinent positives and negatives, otherwise a 10 system review is negative.   Specifically, she denies problems with abdominal pain, pelvic pain, vaginal discharge. Denies bowel problems, bladder problems, incontinence, depression or breast lumps or masses.  Physical Exam Vitals: BP 120/77   Pulse 76   Ht 157.5 cm (5' 2)   Wt 99 kg (218 lb 3.2 oz)   BMI 39.91 kg/m    Chaperone present for pelvic exam. Examination chaperoned by N. Saligan, CMA.  Physical Exam Genitourinary:     Vulva and rectum normal.     Pelvic Tanner Score: 5/5.    Vaginal cuff intact.    No vaginal discharge.     Mild vaginal atrophy present.     Right Adnexa: not tender.    Left Adnexa: not tender.    Cervix is absent.     Uterus is absent.  Breasts:    Tanner Score is 5.     Right: Normal. No mass, nipple discharge, skin change or tenderness.     Left: Normal.  No mass, nipple discharge, skin change or tenderness.  HENT:     Head: Normocephalic.     Nose: Nose normal.  Eyes:     Pupils: Pupils are equal, round, and reactive to light.   Cardiovascular:     Rate and Rhythm: Normal rate and regular rhythm.     Heart sounds: Normal heart sounds.  Pulmonary:     Effort: Pulmonary effort is normal.     Breath sounds: Normal breath sounds.  Abdominal:     General: Bowel sounds are normal.     Palpations: Abdomen is soft.     Tenderness: There is no abdominal tenderness.  Musculoskeletal:        General: Normal range of motion.     Cervical back: Normal range of motion.  Neurological:     Mental Status: [name] alert and oriented to person, place, and time.  Skin:    General: Skin is warm and dry.     Capillary Refill: Capillary refill takes less than 2 seconds.  Psychiatric:        Mood and Affect: Mood normal.        Behavior: Behavior normal.        Thought Content: Thought content normal.        Judgment: Judgment normal.      Results: PHQ-9: 8   Assessment and Plan:  54 y.o. [phone] female here for routine annual gynecologic examination  Plan: Problem List Items Addressed This Visit   None Visit Diagnoses       Encounter for gynecological examination    -  Primary     Encounter for screening mammogram for malignant neoplasm of breast       Relevant Orders   Mammo screening digital bilateral     Depression screening       Relevant Orders   Depression Screen -(PHQ- 2/9, BDI) (Completed)     Boil       Relevant Medications   clindamycin (CLEOCIN T) 1 % swab       Screening: -- Blood pressure screen normal -- Colon Cancer Screening - not due -- Mammogram - due in April.  -- Weight screening: obese: discussed management options, including lifestyle, dietary, and exercise. -- Depression screening positive (PHQ-9)  -- Nutrition: normal -- cholesterol screening: per PCP -- osteoporosis screening: not due -- tobacco screening: not using -- alcohol screening: AUDIT questionnaire indicates low-risk usage. -- family history of breast cancer screening: done. not at high risk. -- no evidence of  domestic violence or intimate partner violence. -- STD screening: gonorrhea/chlamydia NAAT not collected per patient request. -- pap smear not collected per ASCCP guidelines -- flu vaccine: Up to date.  -- HPV vaccination series: not eligilbe  I emphasized the importance of an annual pelvic and breast exam, though as always these exams are her choice.   Return in about 1 year (around 11/29/2025) for [facility] visit.   Attestation Statement:   I personally performed the service, non-incident to. (WP)   KATHERINE PHILLIPS, NP [facility]N [facility] 11/29/2024 3:24 PM

## 2024-12-12 ENCOUNTER — Other Ambulatory Visit: Payer: Self-pay

## 2024-12-12 ENCOUNTER — Encounter: Payer: Self-pay | Admitting: Emergency Medicine

## 2024-12-12 ENCOUNTER — Emergency Department
Admission: EM | Admit: 2024-12-12 | Discharge: 2024-12-13 | Disposition: A | Discharge status: Home / Self Care | Attending: Emergency Medicine | Admitting: Emergency Medicine

## 2024-12-12 DIAGNOSIS — J810 Acute pulmonary edema: Secondary | ICD-10-CM | POA: Diagnosis not present

## 2024-12-12 DIAGNOSIS — Z79899 Other long term (current) drug therapy: Secondary | ICD-10-CM | POA: Insufficient documentation

## 2024-12-12 DIAGNOSIS — I5030 Unspecified diastolic (congestive) heart failure: Secondary | ICD-10-CM | POA: Diagnosis not present

## 2024-12-12 DIAGNOSIS — J45909 Unspecified asthma, uncomplicated: Secondary | ICD-10-CM | POA: Diagnosis not present

## 2024-12-12 DIAGNOSIS — I110 Hypertensive heart disease with heart failure: Secondary | ICD-10-CM | POA: Insufficient documentation

## 2024-12-12 DIAGNOSIS — E119 Type 2 diabetes mellitus without complications: Secondary | ICD-10-CM | POA: Diagnosis not present

## 2024-12-12 DIAGNOSIS — R911 Solitary pulmonary nodule: Secondary | ICD-10-CM | POA: Diagnosis not present

## 2024-12-12 DIAGNOSIS — T50901A Poisoning by unspecified drugs, medicaments and biological substances, accidental (unintentional), initial encounter: Secondary | ICD-10-CM | POA: Diagnosis present

## 2024-12-12 DIAGNOSIS — Z794 Long term (current) use of insulin: Secondary | ICD-10-CM | POA: Diagnosis not present

## 2024-12-12 DIAGNOSIS — Z7901 Long term (current) use of anticoagulants: Secondary | ICD-10-CM | POA: Diagnosis not present

## 2024-12-12 DIAGNOSIS — R079 Chest pain, unspecified: Secondary | ICD-10-CM | POA: Diagnosis not present

## 2024-12-12 DIAGNOSIS — T452X1A Poisoning by vitamins, accidental (unintentional), initial encounter: Secondary | ICD-10-CM | POA: Diagnosis not present

## 2024-12-12 DIAGNOSIS — Z7982 Long term (current) use of aspirin: Secondary | ICD-10-CM | POA: Diagnosis not present

## 2024-12-12 DIAGNOSIS — X58XXXA Exposure to other specified factors, initial encounter: Secondary | ICD-10-CM | POA: Diagnosis not present

## 2024-12-12 DIAGNOSIS — Z955 Presence of coronary angioplasty implant and graft: Secondary | ICD-10-CM | POA: Diagnosis not present

## 2024-12-12 NOTE — ED Triage Notes (Signed)
 Patient ambulatory to triage with steady gait, without difficulty or distress noted; pt reports that 500000 iu Vitamin D on Sunday;took extra ds on Monday accidentally; since yesterday having rapid HR intermittently accomp by [facility]; has mechanical heart valve

## 2024-12-12 NOTE — ED Notes (Addendum)
 Spoke with [employer] who st no recommendations for this extra ds of Vitamin D taken (elevated calcium levels may be noted); recommend to skip uncoming dose

## 2024-12-12 NOTE — ED Provider Notes (Signed)
 "  [facility] Provider Note    Event Date/Time   First MD Initiated Contact with Patient 12/12/24 2351     (approximate)   History   Drug Overdose   HPI  Shelly Huynh is a 54 y.o. 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  She became concerned at home on Wednesday when she realized that she accidentally took a 50,000 international unit dose of vitamin D on Sunday and then accidentally took it again on Monday.  This was unintentional.  No leg swelling.  No history of PE or DVT.  No fevers, cough or congestion.  Denies any aggravating or alleviating factors.   History provided by patient and husband.    Past Medical History:  Diagnosis Date   (HFpEF) heart failure with preserved ejection fraction (HCC)    Anemia    Anticoagulated on warfarin    Anxiety    a.) on BZO (diazepam ) PRN   Aortic atherosclerosis    Aortic valve stenosis and insufficiency, rheumatic    a.) s/p AVR 04/03/2015 - 19 mm St. Jude mechanical valve   Arthritis    Asthma    ATN (acute tubular necrosis)    Cardiomegaly    Carpal tunnel syndrome    Chronic constipation    a.) on linaclotide    Chronic lower back pain    Cirrhosis (HCC)    Complication of anesthesia 02/13/2024   a.) apnea/hypoxia during cardiac ablation on 02/13/2024 at [facility] --> required intubation   Coronary artery calcification seen on CT scan    a.) LHC 12/05/2014: normal coronaries   DDD (degenerative disc disease), cervical    Depression    DOE (dyspnea on exertion)    Emphysema of lung (HCC)    GERD  (gastroesophageal reflux disease)    Helicobacter pylori infection    Hepatic cysts (scattered)    History of hiatal hernia    HTN (hypertension)    Hyperaldosteronism    Hyperlipidemia    Hypertension    IBS (irritable bowel syndrome)    Lumbar facet joint syndrome    Migraines    Mild cognitive impairment    a,) on NMDA receptor antagonist (memantine)   Mitral stenosis with insufficiency, rheumatic    a.) s/p MVR 04/03/2015 - 25 mm St. Jude mechanical valve   Moderate tricuspid regurgitation    a.) s/p TVR 04/03/2015 - 21 mm Mosaic porcine valve   NASH (nonalcoholic steatohepatitis)    Nose colonized with MRSA 02/23/2015   Obesity    OSA on CPAP    Palpitations    Paroxysmal atrial flutter (HCC)    a.) CHA2DS2VASc = 5 (sex, CHF, HTN, vascular disease history, T2DM) as of 03/22/2024; b.) s/p RFA 02/13/2024 at [facility]; c.) rate/rhythm maintained on oral metoprolol  succinate; chronically anticoagulated with warfarin   Renal hemosiderosis    Sciatic pain    T2DM (type 2 diabetes mellitus) (HCC)    Umbilical hernia    a.) s/p repair 09/2004   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HCC)    a.) s/p aortic, mitral, and tricuspid valve replacements 04/03/2015   Vitamin D deficiency     Past Surgical History:  Procedure Laterality Date   ABDOMINAL HYSTERECTOMY  2010   AORTIC VALVE REPLACEMENT  04/03/2015   Procedure: AORTIC VALVE REPLACEMENT (19 mm St. Jude Regent mechanical valve); Location: [facility]; Surgeon: Alm Schwab, MD   ARTERY BIOPSY N/A 10/03/2022   Procedure: BIOPSY TEMPORAL ARTERY;  Surgeon: Rodolph Romano, MD;  Location: [facility];  Service: General;  Laterality: N/A;   BREAST BIOPSY Left 07/25/2017   BENIGN BREAST TISSUE WITH MICROCALCIFICATIONS   BREAST BIOPSY Right 10/22/2019   Affirm Bx- X-clip- CSL, no atypia   BREAST BIOPSY Right 10/22/2019   Affirm Bx- Ribbon clip- CSL, no atypia   BREAST BIOPSY Right 11/20/2019   Procedure: BREAST BIOPSY WITH NEEDLE LOCALIZATION x 2;   Surgeon: Rodolph Romano, MD;  Location: [facility];  Service: General;  Laterality: Right;   BREAST EXCISIONAL BIOPSY Right 1992   NEG   BREAST LUMPECTOMY Right 11/20/2019   complex sclerosing lesion x 2   CARPAL TUNNEL RELEASE Right 03/27/2024   Procedure: RELEASE, CARPAL TUNNEL, ENDOSCOPIC;  Surgeon: Edie Norleen PARAS, MD;  Location: [facility];  Service: Orthopedics;  Laterality: Right;   CESAREAN SECTION N/A 2000   CESAREAN SECTION N/A 1987   CHOLECYSTECTOMY  07/2020   COLONOSCOPY WITH PROPOFOL  N/A 09/21/2020   Procedure: COLONOSCOPY WITH PROPOFOL ;  Surgeon: Toledo, Ladell POUR, MD;  Location: [facility];  Service: Gastroenterology;  Laterality: N/A;   DORSAL COMPARTMENT RELEASE Left 09/18/2024   Procedure: RELEASE, FIRST DORSAL COMPARTMENT, HAND;  Surgeon: Edie Norleen PARAS, MD;  Location: [facility];  Service: Orthopedics;  Laterality: Left;   ESOPHAGOGASTRODUODENOSCOPY (EGD) WITH PROPOFOL  N/A 10/07/2016   Procedure: ESOPHAGOGASTRODUODENOSCOPY (EGD) WITH PROPOFOL ;  Surgeon: Ruel Kung, MD;  Location: [facility];  Service: Endoscopy;  Laterality: N/A;   ESOPHAGOGASTRODUODENOSCOPY (EGD) WITH PROPOFOL  N/A 09/21/2020   Procedure: ESOPHAGOGASTRODUODENOSCOPY (EGD) WITH PROPOFOL ;  Surgeon: Toledo, Ladell POUR, MD;  Location: [facility];  Service: Gastroenterology;  Laterality: N/A;   HIATAL HERNIA REPAIR     LAPAROSCOPIC GASTRIC BAND REMOVAL WITH LAPAROSCOPIC GASTRIC SLEEVE RESECTION     LEFT HEART CATH AND CORONARY ANGIOGRAPHY Left 12/05/2014   Procedure: LEFT HEART CATH AND CORONARY ANGIOGRAPHY; Location: [facility]; Surgeon: Wolm Rhyme, MD   LIPOSUCTION TRUNK  1996   MITRAL VALVE REPLACEMENT  04/03/2015   Procedure: MITRAL VALVE REPLACEMENT (25 mm St. Jude Mechanical valve); Location: [facility]; Surgeon: Alm Schwab, MD   TRICUSPID VALVE REPLACEMENT  04/03/2015   Procedure: TRICUSPID VALVE REPLACEMENT (21 mm Mosaic porcine valve); Location: [facility]; Surgeon: Alm Schwab, MD   TUBAL LIGATION  2000    UMBILICAL HERNIA REPAIR  2005   VENTRAL HERNIA REPAIR      MEDICATIONS:  Prior to Admission medications  Medication Sig Start Date End Date Taking? Authorizing Provider  aspirin  EC 81 MG tablet Take 81 mg by mouth in the morning.    [provider]  candesartan (ATACAND) 8 MG tablet Take 8 mg by mouth at bedtime.  12/07/19   [provider]  Cranberry-Vitamin C-Vitamin E (CRANBERRY PLUS VITAMIN C PO) Take 2 capsules by mouth in the morning.    [provider]  fluticasone  (FLONASE ) 50 MCG/ACT nasal spray Place 2 sprays into both nostrils daily as needed for allergies. Patient not taking: Reported on 09/18/2024 03/27/24   Poggi, Norleen PARAS, MD  HYDROcodone -acetaminophen  (NORCO/VICODIN) 5-325 MG tablet Take 1-2 tablets by mouth daily as needed for moderate pain (pain score 4-6) or severe pain (pain score 7-10). 09/18/24   Poggi, Norleen PARAS, MD  ibuprofen  (ADVIL ) 800 MG tablet Take 1 tablet (800 mg total) by mouth  every 8 (eight) hours as needed. 09/18/24   Poggi, Norleen PARAS, MD  iron  polysaccharides (NIFEREX) 150 MG capsule Take 150 mg by mouth in the morning.    [provider]  lansoprazole (PREVACID) 30 MG capsule Take 30 mg by mouth daily before breakfast.    [provider]  levocetirizine (XYZAL ) 5 MG tablet Take 5 mg by mouth every evening. 08/23/23   [provider]  metoprolol  succinate (TOPROL -XL) 25 MG 24 hr tablet Take 1.5 tablets (37.5 mg total) by mouth every evening. 09/18/24   Poggi, Norleen PARAS, MD  montelukast  (SINGULAIR ) 10 MG tablet Take 10 mg by mouth in the morning. 08/23/23 09/11/27  [provider]  OVER THE COUNTER MEDICATION Take 1 tablet by mouth daily. Nutrafol    [provider]  pravastatin  (PRAVACHOL ) 20 MG tablet Take 20 mg by mouth every evening. 10/25/22   [provider]  pregabalin  (LYRICA ) 75 MG capsule Take 75 mg by mouth 2 (two) times daily.    [provider]  spironolactone  (ALDACTONE )  50 MG tablet Take 50 mg by mouth in the morning. Take with 25 mg to equal 75 mg daily    [provider]  tirzepatide CLOYDE) 7.5 MG/0.5ML Pen Inject 7.5 mg into the skin every Sunday.    [provider]  Vitamin D, Ergocalciferol, (DRISDOL) 1.25 MG (50000 UNIT) CAPS capsule Take 50,000 Units by mouth every Sunday. 06/10/22   [provider]  warfarin (COUMADIN ) 1 MG tablet Take 2 mg by mouth 2 (two) times a week. Take 2 tablets (2 mg) by mouth on Thursdays and Sundays at night.    [provider]  warfarin (COUMADIN ) 6 MG tablet Take 6 mg by mouth every evening.    [provider]    Physical Exam   Triage Vital Signs: ED Triage Vitals  Encounter Vitals Group     BP 12/12/24 2355 (!) 186/97     Girls Systolic BP Percentile --      Girls Diastolic BP Percentile --      Boys Systolic BP Percentile --      Boys Diastolic BP Percentile --      Pulse Rate 12/12/24 2355 (!) 56     Resp 12/12/24 2355 18     Temp 12/12/24 2355 98.3 F (36.8 C)     Temp Source 12/12/24 2355 Oral     SpO2 12/12/24 2355 100 %     Weight 12/12/24 2348 220 lb (99.8 kg)     Height 12/12/24 2348 5' 3 (1.6 m)     Head Circumference --      Peak Flow --      Pain Score 12/12/24 2348 2     Pain Loc --      Pain Education --      Exclude from Growth Chart --     Most recent vital signs: Vitals:   12/13/24 0339 12/13/24 0400  BP: (!) 152/81 (!) 165/86  Pulse: (!) 56 (!) 46  Resp: (!) 21 16  Temp:    SpO2: 93% 96%    CONSTITUTIONAL: Alert, responds appropriately to questions. Well-appearing; well-nourished, extremely pleasant HEAD: Normocephalic, atraumatic EYES: Conjunctivae clear, pupils appear equal, sclera nonicteric ENT: normal nose; moist mucous membranes NECK: Supple, normal ROM CARD: Regular and bradycardic; S1 and S2 appreciated RESP: Normal chest excursion without splinting or tachypnea; breath sounds clear and equal bilaterally; no wheezes, no  rhonchi, no rales, no hypoxia or respiratory distress, speaking full  sentences ABD/GI: Non-distended; soft, non-tender, no rebound, no guarding, no peritoneal signs BACK: The back appears normal EXT: Normal ROM in all joints; no deformity noted, no edema, no calf tenderness or calf swelling SKIN: Normal color for age and race; warm; no rash on exposed skin NEURO: Moves all extremities equally, normal speech PSYCH: The patient's mood and manner are appropriate.   ED Results / Procedures / Treatments   LABS: (all labs ordered are listed, but only abnormal results are displayed) Labs Reviewed  PROTIME-INR - Abnormal; Notable for the following components:      Result Value   Prothrombin Time 26.6 (*)    INR 2.3 (*)    All other components within normal limits  COMPREHENSIVE METABOLIC PANEL WITH GFR - Abnormal; Notable for the following components:   Total Protein 8.3 (*)    All other components within normal limits  PRO BRAIN NATRIURETIC PEPTIDE - Abnormal; Notable for the following components:   Pro Brain Natriuretic Peptide 373.0 (*)    All other components within normal limits  CBC WITH DIFFERENTIAL/PLATELET  MAGNESIUM  TSH  TROPONIN T, HIGH SENSITIVITY  TROPONIN T, HIGH SENSITIVITY     EKG:  EKG Interpretation Date/Time:  Thursday December 12 2024 23:52:27 EST Ventricular Rate:  62 PR Interval:  168 QRS Duration:  96 QT Interval:  434 QTC Calculation: 440 R Axis:   61  Text Interpretation: Normal sinus rhythm Incomplete right bundle branch block Borderline ECG When compared with ECG of 27-Sep-2023 13:50, PREVIOUS ECG IS PRESENT Confirmed by Neomi Neptune [phone]) on 12/12/2024 11:57:04 PM         RADIOLOGY: My personal review and interpretation of imaging: Chest x-ray shows mild pulmonary edema.  I have personally reviewed all radiology reports.   DG Chest Portable 1 View Result Date: 12/13/2024 EXAM: 1 VIEW(S) XRAY OF THE CHEST 12/13/2024 12:34:00 AM COMPARISON:  11/22/2023. CLINICAL HISTORY: Chest pain, shortness of breath. FINDINGS: LUNGS AND PLEURA: 1.4 cm nodular opacity in left upper lobe, previously characterized as benign on remote prior CT, although possibly mildly progressive. Mild pulmonary edema. No pleural effusion. No pneumothorax. HEART AND MEDIASTINUM: Cardiomegaly. Mitral valve replacement noted. Status post median sternotomy. BONES AND SOFT TISSUES: Status post median sternotomy. No acute osseous abnormality. IMPRESSION: 1. Cardiomegaly with mild pulmonary edema. 2. 1.4 cm left upper lobe nodular opacity, previously characterized as benign on remote prior CT though possibly mildly progressive; recommend chest CT for reassessment. Electronically signed by: Pinkie Pebbles MD 12/13/2024 12:41 AM EST RP Workstation: [id number]     PROCEDURES:  Critical Care performed: No    .1-3 Lead EKG Interpretation  Performed by: Jahyra Sukup, Neptune SAILOR, DO Authorized by: Gladys Gutman, Neptune SAILOR, DO     Interpretation: normal     ECG rate:  56   ECG rate assessment: bradycardic     Rhythm: sinus bradycardia     Ectopy: none     Conduction: normal       IMPRESSION / MDM / ASSESSMENT AND PLAN / ED COURSE  I reviewed the triage vital signs and the nursing notes.    Patient here with chest pressure, palpitations, shortness of breath.  Also accidentally took an extra dose of vitamin D this week.  The patient is on the cardiac monitor to evaluate for evidence of arrhythmia and/or significant heart rate changes.   DIFFERENTIAL DIAGNOSIS (includes but not limited to):   Anxiety, ACS, arrhythmia, electrolyte derangement, thyroid  dysfunction, CHF exacerbation, less likely pneumonia, less likely PE, doubt dissection  Patient's presentation is most consistent with acute presentation with potential threat to life or bodily function.   PLAN: Poison control contacted.  No concern for accidental ingestion of an extra dose of 50,000 international units of vitamin D  this week.  Have recommended that she hold her upcoming dose next week.  She verbalized understanding.  Will obtain cardiac labs, chest x-ray.  EKG shows no arrhythmia, interval changes, ischemia.  Will give aspirin , nitroglycerin.  Will place on cardiac monitoring.  She does have a significant cardiac history.   MEDICATIONS GIVEN IN ED: Medications  nitroGLYCERIN (NITROSTAT) SL tablet 0.4 mg (0.4 mg Sublingual Given 12/13/24 0017)  aspirin  chewable tablet 324 mg (324 mg Oral Given 12/13/24 0017)  acetaminophen  (TYLENOL ) tablet 1,000 mg (1,000 mg Oral Given 12/13/24 0017)  furosemide  (LASIX ) injection 40 mg (40 mg Intravenous Given 12/13/24 0054)     ED COURSE: Patient's labs show normal hemoglobin, electrolytes.  INR of 2.3.  TSH normal.  First troponin negative.  Second pending.  Chest x-ray reviewed and interpreted by myself and the radiologist and shows mild pulmonary edema.  BNP only very minimally elevated at 373 although this could be falsely low due to elevated BMI.  [name] getting IV Lasix  here and diuresing well.  No event seen on cardiac monitoring and no hypoxia.  We did discuss the possibility of admission to the hospital but patient states she would prefer discharge home with outpatient follow-up with her cardiologist.   Chest x-ray also shows a 1.4 cm left upper lobe nodule that has been seen previously on remote prior CT and characterized as benign.  I have referred her to pulmonary nodule clinic for outpatient follow-up for this.  Plan will be to discharge home if second troponin negative.  4:31 AM  Pt's second troponin negative.  [name] feeling well.  No events on cardiac monitoring.  Will discharge with follow-up with her cardiologist and with referral to the pulmonary nodule clinic.  Will also discharge with 3-day course of Lasix  for diuresis at home.   At this time, I do not feel there is any life-threatening condition present. I reviewed all nursing notes, vitals, pertinent  previous records.  All lab and urine results, EKGs, imaging ordered have been independently reviewed and interpreted by myself.  I reviewed all available radiology reports from any imaging ordered this visit.  Based on my assessment, I feel the patient is safe to be discharged home without further emergent workup and can continue workup as an outpatient as needed. Discussed all findings, treatment plan as well as usual and customary return precautions.  They verbalize understanding and are comfortable with this plan.  Outpatient follow-up has been provided as needed.  All questions have been answered.    CONSULTS: Admission offered however patient states she is feeling better and would like discharge home with outpatient follow-up with her cardiologist.   OUTSIDE RECORDS REVIEWED: Reviewed recent OB/GYN and cardiology notes.       FINAL CLINICAL IMPRESSION(S) / ED DIAGNOSES   Final diagnoses:  Chest pain, unspecified type  Accidental overdose, initial encounter  Pulmonary nodule  Acute pulmonary edema [facility])     Rx / DC Orders   ED Discharge Orders          Ordered    Ambulatory referral to Pulmonology        12/13/24 0255    furosemide  (LASIX ) 20 MG tablet  Daily        12/13/24 0431  Note:  This document was prepared using Dragon voice recognition software and may include unintentional dictation errors.   Gessica Jawad, Josette SAILOR, DO 12/13/24 [phone]  "

## 2024-12-13 ENCOUNTER — Emergency Department

## 2024-12-13 LAB — CBC WITH DIFFERENTIAL/PLATELET
Abs Immature Granulocytes: 0.03 K/uL (ref 0.00–0.07)
Basophils Absolute: 0.1 K/uL (ref 0.0–0.1)
Basophils Relative: 1 %
Eosinophils Absolute: 0.2 K/uL (ref 0.0–0.5)
Eosinophils Relative: 3 %
HCT: 41.9 % (ref 36.0–46.0)
Hemoglobin: 13.3 g/dL (ref 12.0–15.0)
Immature Granulocytes: 1 %
Lymphocytes Relative: 38 %
Lymphs Abs: 2.3 K/uL (ref 0.7–4.0)
MCH: 29.2 pg (ref 26.0–34.0)
MCHC: 31.7 g/dL (ref 30.0–36.0)
MCV: 92.1 fL (ref 80.0–100.0)
Monocytes Absolute: 0.6 K/uL (ref 0.1–1.0)
Monocytes Relative: 9 %
Neutro Abs: 2.9 K/uL (ref 1.7–7.7)
Neutrophils Relative %: 48 %
Platelets: 199 K/uL (ref 150–400)
RBC: 4.55 MIL/uL (ref 3.87–5.11)
RDW: 15.4 % (ref 11.5–15.5)
WBC: 6.1 K/uL (ref 4.0–10.5)
nRBC: 0 % (ref 0.0–0.2)

## 2024-12-13 LAB — COMPREHENSIVE METABOLIC PANEL WITH GFR
ALT: 12 U/L (ref 0–44)
AST: 29 U/L (ref 15–41)
Albumin: 4.3 g/dL (ref 3.5–5.0)
Alkaline Phosphatase: 90 U/L (ref 38–126)
Anion gap: 12 (ref 5–15)
BUN: 8 mg/dL (ref 6–20)
CO2: 24 mmol/L (ref 22–32)
Calcium: 9.8 mg/dL (ref 8.9–10.3)
Chloride: 106 mmol/L (ref 98–111)
Creatinine, Ser: 0.95 mg/dL (ref 0.44–1.00)
GFR, Estimated: >60 mL/min
Glucose, Bld: 83 mg/dL (ref 70–99)
Potassium: 3.9 mmol/L (ref 3.5–5.1)
Sodium: 142 mmol/L (ref 135–145)
Total Bilirubin: 0.6 mg/dL (ref 0.0–1.2)
Total Protein: 8.3 g/dL — ABNORMAL HIGH (ref 6.5–8.1)

## 2024-12-13 LAB — TROPONIN T, HIGH SENSITIVITY
Troponin T High Sensitivity: 13 ng/L (ref 0–19)
Troponin T High Sensitivity: 15 ng/L (ref 0–19)

## 2024-12-13 LAB — MAGNESIUM: Magnesium: 2.1 mg/dL (ref 1.7–2.4)

## 2024-12-13 LAB — PRO BRAIN NATRIURETIC PEPTIDE: Pro Brain Natriuretic Peptide: 373 pg/mL — ABNORMAL HIGH

## 2024-12-13 LAB — TSH: TSH: 1.26 u[IU]/mL (ref 0.350–4.500)

## 2024-12-13 LAB — PROTIME-INR
INR: 2.3 — ABNORMAL HIGH (ref 0.8–1.2)
Prothrombin Time: 26.6 s — ABNORMAL HIGH (ref 11.4–15.2)

## 2024-12-13 MED ORDER — FUROSEMIDE 10 MG/ML IJ SOLN
40.0000 mg | Freq: Once | INTRAMUSCULAR | Status: AC
  Administered 2024-12-13: 40 mg via INTRAVENOUS
  Filled 2024-12-13: qty 4

## 2024-12-13 MED ORDER — NITROGLYCERIN 0.4 MG SL SUBL
0.4000 mg | SUBLINGUAL_TABLET | SUBLINGUAL | Status: DC | PRN
  Administered 2024-12-13: 0.4 mg via SUBLINGUAL
  Filled 2024-12-13: qty 1

## 2024-12-13 MED ORDER — FUROSEMIDE 20 MG PO TABS: 20.0000 mg | ORAL_TABLET | Freq: Every day | ORAL | 0 refills | Status: AC

## 2024-12-13 MED ORDER — ACETAMINOPHEN 500 MG PO TABS
1000.0000 mg | ORAL_TABLET | Freq: Once | ORAL | Status: AC
  Administered 2024-12-13: 1000 mg via ORAL
  Filled 2024-12-13: qty 2

## 2024-12-13 MED ORDER — ASPIRIN 81 MG PO CHEW
324.0000 mg | CHEWABLE_TABLET | Freq: Once | ORAL | Status: AC
  Administered 2024-12-13: 324 mg via ORAL
  Filled 2024-12-13: qty 4

## 2024-12-13 NOTE — Discharge Instructions (Addendum)
 Please skip your next dose of vitamin D.  Poison control states that you are safe to be discharged without further emergent workup or monitoring.  Your cardiac labs today were reassuring.  No sign of heart attack.  You did have mild pulmonary edema on your chest x-ray.  We have given you Lasix  here to help with managing the symptoms and recommend close follow-up with your cardiologist.  Please call in the morning.  I recommend that you take a small dose of Lasix  daily for the next 3 days.  We have provided you a prescription for this.   Your chest x-ray did incidentally show a 1.4 cm nodule in the left lung.  This has been seen previously on imaging.  The radiologist recommends CT scan as an outpatient to ensure stability and we have placed a referral for pulmonology.  This is not an emergent finding and not the cause of your symptoms today.

## 2024-12-24 ENCOUNTER — Ambulatory Visit: Admitting: Internal Medicine
